# Patient Record
Sex: Male | Born: 1945
Health system: Southern US, Community
[De-identification: ages and names within clinical notes are randomized; demographics above are authoritative.]

## PROBLEM LIST (undated history)

## (undated) DIAGNOSIS — E7800 Pure hypercholesterolemia, unspecified: Secondary | ICD-10-CM

## (undated) DIAGNOSIS — J9801 Acute bronchospasm: Secondary | ICD-10-CM

## (undated) DIAGNOSIS — K284 Chronic or unspecified gastrojejunal ulcer with hemorrhage: Secondary | ICD-10-CM

## (undated) DIAGNOSIS — K274 Chronic or unspecified peptic ulcer, site unspecified, with hemorrhage: Secondary | ICD-10-CM

## (undated) DIAGNOSIS — K5790 Diverticulosis of intestine, part unspecified, without perforation or abscess without bleeding: Secondary | ICD-10-CM

## (undated) DIAGNOSIS — S78119A Complete traumatic amputation at level between unspecified hip and knee, initial encounter: Secondary | ICD-10-CM

## (undated) DIAGNOSIS — E119 Type 2 diabetes mellitus without complications: Secondary | ICD-10-CM

## (undated) HISTORY — PX: KNEE SURGERY: SHX244

## (undated) HISTORY — DX: Diverticulosis of intestine, part unspecified, without perforation or abscess without bleeding: K57.90

## (undated) HISTORY — PX: REPLACEMENT TOTAL KNEE: SUR1224

## (undated) HISTORY — DX: Chronic or unspecified peptic ulcer, site unspecified, with hemorrhage: K27.4

## (undated) HISTORY — DX: Complete traumatic amputation at level between unspecified hip and knee, initial encounter: S78.119A

## (undated) HISTORY — DX: Chronic or unspecified gastrojejunal ulcer with hemorrhage: K28.4

## (undated) HISTORY — DX: Type 2 diabetes mellitus without complications: E11.9

## (undated) HISTORY — PX: APPENDECTOMY: SHX54

## (undated) HISTORY — DX: Acute bronchospasm: J98.01

## (undated) HISTORY — DX: Pure hypercholesterolemia, unspecified: E78.00

---

## 1969-04-10 HISTORY — PX: ABOVE KNEE LEG AMPUTATION: SUR20

## 2004-09-17 ENCOUNTER — Ambulatory Visit: Payer: Self-pay | Admitting: Internal Medicine

## 2005-04-04 ENCOUNTER — Encounter: Admission: RE | Admit: 2005-04-04 | Discharge: 2005-04-04 | Payer: Self-pay | Admitting: Family Medicine

## 2008-12-05 ENCOUNTER — Inpatient Hospital Stay: Payer: Self-pay | Admitting: Internal Medicine

## 2008-12-21 ENCOUNTER — Ambulatory Visit: Payer: Self-pay | Admitting: Cardiovascular Disease

## 2009-01-21 ENCOUNTER — Ambulatory Visit: Payer: Self-pay | Admitting: Gastroenterology

## 2011-07-26 ENCOUNTER — Ambulatory Visit: Payer: Self-pay

## 2014-06-18 DIAGNOSIS — S78111D Complete traumatic amputation at level between right hip and knee, subsequent encounter: Secondary | ICD-10-CM | POA: Diagnosis not present

## 2014-06-18 DIAGNOSIS — J9801 Acute bronchospasm: Secondary | ICD-10-CM | POA: Diagnosis not present

## 2014-06-18 DIAGNOSIS — R05 Cough: Secondary | ICD-10-CM | POA: Diagnosis not present

## 2014-06-22 ENCOUNTER — Emergency Department: Payer: Self-pay | Admitting: Emergency Medicine

## 2014-06-22 DIAGNOSIS — R05 Cough: Secondary | ICD-10-CM | POA: Diagnosis not present

## 2014-06-22 DIAGNOSIS — I10 Essential (primary) hypertension: Secondary | ICD-10-CM | POA: Diagnosis not present

## 2014-06-22 DIAGNOSIS — J209 Acute bronchitis, unspecified: Secondary | ICD-10-CM | POA: Diagnosis not present

## 2014-06-22 DIAGNOSIS — R9431 Abnormal electrocardiogram [ECG] [EKG]: Secondary | ICD-10-CM | POA: Diagnosis not present

## 2014-06-22 DIAGNOSIS — R0602 Shortness of breath: Secondary | ICD-10-CM | POA: Diagnosis not present

## 2014-07-02 DIAGNOSIS — Z0000 Encounter for general adult medical examination without abnormal findings: Secondary | ICD-10-CM | POA: Diagnosis not present

## 2014-07-02 DIAGNOSIS — I2510 Atherosclerotic heart disease of native coronary artery without angina pectoris: Secondary | ICD-10-CM | POA: Diagnosis not present

## 2014-07-02 DIAGNOSIS — I10 Essential (primary) hypertension: Secondary | ICD-10-CM | POA: Diagnosis not present

## 2014-07-02 DIAGNOSIS — Z125 Encounter for screening for malignant neoplasm of prostate: Secondary | ICD-10-CM | POA: Diagnosis not present

## 2014-07-02 DIAGNOSIS — E119 Type 2 diabetes mellitus without complications: Secondary | ICD-10-CM | POA: Diagnosis not present

## 2014-07-02 DIAGNOSIS — E780 Pure hypercholesterolemia: Secondary | ICD-10-CM | POA: Diagnosis not present

## 2014-09-29 DIAGNOSIS — I2510 Atherosclerotic heart disease of native coronary artery without angina pectoris: Secondary | ICD-10-CM | POA: Insufficient documentation

## 2014-09-29 DIAGNOSIS — K284 Chronic or unspecified gastrojejunal ulcer with hemorrhage: Secondary | ICD-10-CM | POA: Insufficient documentation

## 2014-09-29 DIAGNOSIS — E1169 Type 2 diabetes mellitus with other specified complication: Secondary | ICD-10-CM | POA: Insufficient documentation

## 2014-09-29 DIAGNOSIS — I10 Essential (primary) hypertension: Secondary | ICD-10-CM

## 2014-09-29 DIAGNOSIS — E7800 Pure hypercholesterolemia, unspecified: Secondary | ICD-10-CM

## 2014-09-29 DIAGNOSIS — E785 Hyperlipidemia, unspecified: Secondary | ICD-10-CM | POA: Insufficient documentation

## 2014-09-29 DIAGNOSIS — K274 Chronic or unspecified peptic ulcer, site unspecified, with hemorrhage: Secondary | ICD-10-CM | POA: Insufficient documentation

## 2014-09-29 DIAGNOSIS — K5790 Diverticulosis of intestine, part unspecified, without perforation or abscess without bleeding: Secondary | ICD-10-CM | POA: Insufficient documentation

## 2014-09-29 DIAGNOSIS — I152 Hypertension secondary to endocrine disorders: Secondary | ICD-10-CM | POA: Insufficient documentation

## 2014-09-29 DIAGNOSIS — S78111D Complete traumatic amputation at level between right hip and knee, subsequent encounter: Secondary | ICD-10-CM | POA: Insufficient documentation

## 2014-09-29 DIAGNOSIS — E1159 Type 2 diabetes mellitus with other circulatory complications: Secondary | ICD-10-CM | POA: Insufficient documentation

## 2014-09-29 DIAGNOSIS — J9801 Acute bronchospasm: Secondary | ICD-10-CM

## 2014-09-29 DIAGNOSIS — S78119A Complete traumatic amputation at level between unspecified hip and knee, initial encounter: Secondary | ICD-10-CM

## 2014-09-29 DIAGNOSIS — K254 Chronic or unspecified gastric ulcer with hemorrhage: Secondary | ICD-10-CM | POA: Insufficient documentation

## 2014-09-29 DIAGNOSIS — S78111A Complete traumatic amputation at level between right hip and knee, initial encounter: Secondary | ICD-10-CM | POA: Insufficient documentation

## 2014-09-30 ENCOUNTER — Ambulatory Visit (INDEPENDENT_AMBULATORY_CARE_PROVIDER_SITE_OTHER): Payer: Medicare Other | Admitting: Family Medicine

## 2014-09-30 ENCOUNTER — Encounter: Payer: Self-pay | Admitting: Family Medicine

## 2014-09-30 VITALS — BP 138/69 | HR 57 | Temp 97.9°F | Ht 67.0 in | Wt 188.0 lb

## 2014-09-30 DIAGNOSIS — E7800 Pure hypercholesterolemia, unspecified: Secondary | ICD-10-CM

## 2014-09-30 DIAGNOSIS — E119 Type 2 diabetes mellitus without complications: Secondary | ICD-10-CM

## 2014-09-30 DIAGNOSIS — I2583 Coronary atherosclerosis due to lipid rich plaque: Secondary | ICD-10-CM

## 2014-09-30 DIAGNOSIS — I2510 Atherosclerotic heart disease of native coronary artery without angina pectoris: Secondary | ICD-10-CM

## 2014-09-30 DIAGNOSIS — I10 Essential (primary) hypertension: Secondary | ICD-10-CM | POA: Diagnosis not present

## 2014-09-30 DIAGNOSIS — S78111D Complete traumatic amputation at level between right hip and knee, subsequent encounter: Secondary | ICD-10-CM

## 2014-09-30 LAB — BAYER DCA HB A1C WAIVED: HB A1C (BAYER DCA - WAIVED): 7.2 % — ABNORMAL HIGH (ref <7.0)

## 2014-09-30 NOTE — Assessment & Plan Note (Signed)
The current medical regimen is effective;  continue present plan and medications.  

## 2014-09-30 NOTE — Assessment & Plan Note (Signed)
Stable needs new cup gave rx

## 2014-09-30 NOTE — Progress Notes (Signed)
   BP 138/69 mmHg  Pulse 57  Temp(Src) 97.9 F (36.6 C)  Ht 5\' 7"  (1.702 m)  Wt 188 lb (85.276 kg)  BMI 29.44 kg/m2  SpO2 95%   Subjective:    Patient ID: Carl Bryan, male    DOB: 05-Jun-1945, 69 y.o.   MRN: [id number]  HPI: Carl Bryan is a 69 y.o. male  Chief Complaint  Patient presents with  . Diabetes  . Referral    Rx for prosthetic cup  done on paper pt with AKA on rt  Doing well with medical problems No side effects meds Takes every day No low bs spells Good long term control  Relevant past medical, surgical, family and social history reviewed and updated as indicated. Interim medical history since our last visit reviewed. Allergies and medications reviewed and updated.  Review of Systems  Constitutional: Negative.   Respiratory: Negative.   Cardiovascular: Negative.     Per HPI unless specifically indicated above     Objective:    BP 138/69 mmHg  Pulse 57  Temp(Src) 97.9 F (36.6 C)  Ht 5\' 7"  (1.702 m)  Wt 188 lb (85.276 kg)  BMI 29.44 kg/m2  SpO2 95%  Wt Readings from Last 3 Encounters:  09/30/14 188 lb (85.276 kg)  07/02/14 175 lb (79.379 kg)  07/02/14 175 lb (79.379 kg)    Physical Exam  Constitutional: He is oriented to person, place, and time. He appears well-developed and well-nourished. No distress.  HENT:  Head: Normocephalic and atraumatic.  Right Ear: Hearing normal.  Left Ear: Hearing normal.  Nose: Nose normal.  Eyes: Conjunctivae and lids are normal. Right eye exhibits no discharge. Left eye exhibits no discharge. No scleral icterus.  Pulmonary/Chest: Effort normal. No respiratory distress.  Musculoskeletal: Normal range of motion.  Rt AKA  Neurological: He is alert and oriented to person, place, and time.  Skin: Skin is intact. No rash noted.  Psychiatric: He has a normal mood and affect. His speech is normal and behavior is normal. Judgment and thought content normal. Cognition and memory are normal.    Results for orders  placed or performed in visit on 09/30/14  Bayer DCA Hb A1c Waived  Result Value Ref Range   Bayer DCA Hb A1c Waived 7.2 (H) <7.0 %      Assessment & Plan:   Problem List Items Addressed This Visit    None    Visit Diagnoses    Diabetes mellitus without complication    -  Primary    Relevant Orders    Bayer DCA Hb A1c Waived (Completed)        Follow up plan: Return in about 3 months (around 12/31/2014) for recheck meds.

## 2014-12-31 ENCOUNTER — Encounter: Payer: Self-pay | Admitting: Family Medicine

## 2014-12-31 ENCOUNTER — Ambulatory Visit (INDEPENDENT_AMBULATORY_CARE_PROVIDER_SITE_OTHER): Payer: Medicare Other | Admitting: Family Medicine

## 2014-12-31 VITALS — BP 136/84 | HR 57 | Temp 97.8°F | Ht 66.2 in | Wt 176.0 lb

## 2014-12-31 DIAGNOSIS — E119 Type 2 diabetes mellitus without complications: Secondary | ICD-10-CM | POA: Diagnosis not present

## 2014-12-31 DIAGNOSIS — I2510 Atherosclerotic heart disease of native coronary artery without angina pectoris: Secondary | ICD-10-CM | POA: Diagnosis not present

## 2014-12-31 DIAGNOSIS — E7800 Pure hypercholesterolemia, unspecified: Secondary | ICD-10-CM

## 2014-12-31 DIAGNOSIS — E785 Hyperlipidemia, unspecified: Secondary | ICD-10-CM

## 2014-12-31 DIAGNOSIS — Z23 Encounter for immunization: Secondary | ICD-10-CM | POA: Diagnosis not present

## 2014-12-31 DIAGNOSIS — I10 Essential (primary) hypertension: Secondary | ICD-10-CM

## 2014-12-31 DIAGNOSIS — I2583 Coronary atherosclerosis due to lipid rich plaque: Secondary | ICD-10-CM

## 2014-12-31 LAB — LP+ALT+AST PICCOLO, WAIVED
ALT (SGPT) Piccolo, Waived: 64 U/L — ABNORMAL HIGH (ref 10–47)
AST (SGOT) Piccolo, Waived: 41 U/L — ABNORMAL HIGH (ref 11–38)
Chol/HDL Ratio Piccolo,Waive: 3.6 mg/dL
Cholesterol Piccolo, Waived: 143 mg/dL (ref <200)
HDL Chol Piccolo, Waived: 40 mg/dL — ABNORMAL LOW (ref >59)
LDL Chol Calc Piccolo Waived: 67 mg/dL (ref <100)
Triglycerides Piccolo,Waived: 181 mg/dL — ABNORMAL HIGH (ref <150)
VLDL Chol Calc Piccolo,Waive: 36 mg/dL — ABNORMAL HIGH (ref <30)

## 2014-12-31 LAB — BAYER DCA HB A1C WAIVED: HB A1C (BAYER DCA - WAIVED): 7.7 % — ABNORMAL HIGH (ref <7.0)

## 2014-12-31 MED ORDER — ALLOPURINOL 300 MG PO TABS: 300.0000 mg | ORAL_TABLET | Freq: Every day | ORAL | Status: DC

## 2014-12-31 MED ORDER — GLIPIZIDE 5 MG PO TABS: 5.0000 mg | ORAL_TABLET | Freq: Every day | ORAL | Status: DC

## 2014-12-31 MED ORDER — LOSARTAN POTASSIUM 100 MG PO TABS: 100.0000 mg | ORAL_TABLET | Freq: Every day | ORAL | Status: DC

## 2014-12-31 MED ORDER — ROSUVASTATIN CALCIUM 40 MG PO TABS: 40.0000 mg | ORAL_TABLET | Freq: Every day | ORAL | Status: DC

## 2014-12-31 MED ORDER — METFORMIN HCL 500 MG PO TABS: 500.0000 mg | ORAL_TABLET | Freq: Two times a day (BID) | ORAL | Status: DC

## 2014-12-31 NOTE — Assessment & Plan Note (Signed)
The current medical regimen is effective;  continue present plan and medications.  

## 2014-12-31 NOTE — Progress Notes (Signed)
BP 136/84 mmHg  Pulse 57  Temp(Src) 97.8 F (36.6 C)  Ht 5' 6.2" (1.681 m)  Wt 176 lb (79.833 kg)  BMI 28.25 kg/m2  SpO2 95%   Subjective:    Patient ID: Carl Bryan, male    DOB: 05-20-1945, 69 y.o.   MRN: [id number]  HPI: Carl Bryan is a 69 y.o. male  Chief Complaint  Patient presents with  . Diabetes  . Hyperlipidemia  . Hypertension   Patient concern today is feeling tired and draggy very sleepy after he eats in the morning lasts until he sleeps about an hour and then is good for the day. No chest pain chest tightness with exertion. Blood sugar with home checks around 120 or so. No gout symptoms Blood pressure cholesterol doing well no complaints from medications takes everyday without problems or side effects. Relevant past medical, surgical, family and social history reviewed and updated as indicated. Interim medical history since our last visit reviewed. Allergies and medications reviewed and updated.  Review of Systems  Constitutional: Negative.   Respiratory: Negative.   Cardiovascular: Negative.     Per HPI unless specifically indicated above     Objective:    BP 136/84 mmHg  Pulse 57  Temp(Src) 97.8 F (36.6 C)  Ht 5' 6.2" (1.681 m)  Wt 176 lb (79.833 kg)  BMI 28.25 kg/m2  SpO2 95%  Wt Readings from Last 3 Encounters:  12/31/14 176 lb (79.833 kg)  09/30/14 188 lb (85.276 kg)  07/02/14 175 lb (79.379 kg)    Physical Exam  Constitutional: He is oriented to person, place, and time. He appears well-developed and well-nourished. No distress.  HENT:  Head: Normocephalic and atraumatic.  Right Ear: Hearing normal.  Left Ear: Hearing normal.  Nose: Nose normal.  Eyes: Conjunctivae and lids are normal. Right eye exhibits no discharge. Left eye exhibits no discharge. No scleral icterus.  Cardiovascular: Normal rate, regular rhythm and normal heart sounds.   Pulmonary/Chest: Effort normal and breath sounds normal. No respiratory distress.   Musculoskeletal: Normal range of motion.  Neurological: He is alert and oriented to person, place, and time.  Skin: Skin is intact. No rash noted.  Psychiatric: He has a normal mood and affect. His speech is normal and behavior is normal. Judgment and thought content normal. Cognition and memory are normal.    Results for orders placed or performed in visit on 09/30/14  Bayer DCA Hb A1c Waived  Result Value Ref Range   Bayer DCA Hb A1c Waived 7.2 (H) <7.0 %      Assessment & Plan:   Problem List Items Addressed This Visit      Cardiovascular and Mediastinum   Essential (primary) hypertension    The current medical regimen is effective;  continue present plan and medications.       Relevant Medications   rosuvastatin (CRESTOR) 40 MG tablet   losartan (COZAAR) 100 MG tablet   CAD (coronary artery disease)    The current medical regimen is effective;  continue present plan and medications.       Relevant Medications   rosuvastatin (CRESTOR) 40 MG tablet   losartan (COZAAR) 100 MG tablet     Endocrine   Type 2 diabetes mellitus    Hemoglobin A1c elevated may be contributing to patient's fatigue Patient will check blood sugar after breakfast see it's too high Will start taking metformin twice a day instead of just at bedtime. And observe fatigue symptoms.  Relevant Medications   rosuvastatin (CRESTOR) 40 MG tablet   metFORMIN (GLUCOPHAGE) 500 MG tablet   losartan (COZAAR) 100 MG tablet   glipiZIDE (GLUCOTROL) 5 MG tablet     Other   Pure hypercholesterolemia    The current medical regimen is effective;  continue present plan and medications.       Relevant Medications   rosuvastatin (CRESTOR) 40 MG tablet   losartan (COZAAR) 100 MG tablet    Other Visit Diagnoses    Diabetes mellitus without complication    -  Primary    Relevant Medications    rosuvastatin (CRESTOR) 40 MG tablet    metFORMIN (GLUCOPHAGE) 500 MG tablet    losartan (COZAAR) 100 MG tablet     glipiZIDE (GLUCOTROL) 5 MG tablet    Other Relevant Orders    Bayer DCA Hb A1c Waived    LP+ALT+AST Piccolo, Waived    Basic metabolic panel    Hyperlipemia        Relevant Medications    rosuvastatin (CRESTOR) 40 MG tablet    losartan (COZAAR) 100 MG tablet    Other Relevant Orders    Bayer DCA Hb A1c Waived    LP+ALT+AST Piccolo, Waived    Basic metabolic panel    Essential hypertension, benign        Relevant Medications    rosuvastatin (CRESTOR) 40 MG tablet    losartan (COZAAR) 100 MG tablet    Other Relevant Orders    Bayer DCA Hb A1c Waived    LP+ALT+AST Piccolo, Waived    Basic metabolic panel    Immunization due        Relevant Orders    Flu Vaccine QUAD 36+ mos PF IM (Fluarix & Fluzone Quad PF) (Completed)        Follow up plan: Return in about 3 months (around 04/01/2015), or if symptoms worsen or fail to improve, for  check hemoglobin A1c.

## 2014-12-31 NOTE — Assessment & Plan Note (Signed)
Hemoglobin A1c elevated may be contributing to patient's fatigue Patient will check blood sugar after breakfast see it's too high Will start taking metformin twice a day instead of just at bedtime. And observe fatigue symptoms.

## 2015-01-01 LAB — BASIC METABOLIC PANEL
BUN/Creatinine Ratio: 22 (ref 10–22)
BUN: 16 mg/dL (ref 8–27)
CO2: 23 mmol/L (ref 18–29)
Calcium: 10.2 mg/dL (ref 8.6–10.2)
Chloride: 99 mmol/L (ref 97–108)
Creatinine, Ser: 0.74 mg/dL — ABNORMAL LOW (ref 0.76–1.27)
GFR calc Af Amer: 108 mL/min/{1.73_m2} (ref >59)
GFR calc non Af Amer: 93 mL/min/{1.73_m2} (ref >59)
Glucose: 167 mg/dL — ABNORMAL HIGH (ref 65–99)
Potassium: 4.5 mmol/L (ref 3.5–5.2)
Sodium: 137 mmol/L (ref 134–144)

## 2015-02-08 ENCOUNTER — Encounter: Payer: Self-pay | Admitting: Family Medicine

## 2015-02-08 ENCOUNTER — Ambulatory Visit (INDEPENDENT_AMBULATORY_CARE_PROVIDER_SITE_OTHER): Payer: Medicare Other | Admitting: Family Medicine

## 2015-02-08 VITALS — BP 154/70 | HR 65 | Temp 98.8°F | Ht 66.0 in | Wt 177.0 lb

## 2015-02-08 DIAGNOSIS — J0110 Acute frontal sinusitis, unspecified: Secondary | ICD-10-CM | POA: Diagnosis not present

## 2015-02-08 DIAGNOSIS — J329 Chronic sinusitis, unspecified: Secondary | ICD-10-CM | POA: Insufficient documentation

## 2015-02-08 MED ORDER — AZITHROMYCIN 250 MG PO TABS: ORAL_TABLET | ORAL | Status: DC

## 2015-02-08 NOTE — Progress Notes (Signed)
BP 154/70 mmHg  Pulse 65  Temp(Src) 98.8 F (37.1 C)  Ht  (1.676 m)  Wt 177 lb (80.287 kg)  BMI 28.58 kg/m2  SpO2 94%   Subjective:    Patient ID: Carl Bryan, male    DOB: December 03, 1945, 69 y.o.   MRN: [id number]  HPI: Carl Bryan is a 69 y.o. male  Chief Complaint  Patient presents with  . Cough   Patient now with 4 days of sinus pressure congestion cough generally productive and getting worse over the last weekend. Patient's taking some over-the-counter medications Tylenol Sinus with some help helped with headache primarily. Has tried some over-the-counter cough medicine which helped a little bit to enable him to get some sleep. Not known if running any fever but feels like he is burning up. Relevant past medical, surgical, family and social history reviewed and updated as indicated. Interim medical history since our last visit reviewed. Allergies and medications reviewed and updated.  Review of Systems  Constitutional: Positive for fever, chills, activity change and fatigue.  HENT: Positive for congestion, ear pain, rhinorrhea, sinus pressure, sneezing and sore throat.   Respiratory: Positive for cough and wheezing.   Cardiovascular: Negative for chest pain, palpitations and leg swelling.    Per HPI unless specifically indicated above     Objective:    BP 154/70 mmHg  Pulse 65  Temp(Src) 98.8 F (37.1 C)  Ht  (1.676 m)  Wt 177 lb (80.287 kg)  BMI 28.58 kg/m2  SpO2 94%  Wt Readings from Last 3 Encounters:  02/08/15 177 lb (80.287 kg)  12/31/14 176 lb (79.833 kg)  09/30/14 188 lb (85.276 kg)    Physical Exam  Constitutional: He is oriented to person, place, and time. He appears well-developed and well-nourished. No distress.  HENT:  Head: Normocephalic and atraumatic.  Right Ear: Hearing and external ear normal.  Left Ear: Hearing and external ear normal.  Nose: Nose normal.  Mouth/Throat: Oropharyngeal exudate present.  Eyes: Conjunctivae and lids  are normal. Right eye exhibits no discharge. Left eye exhibits no discharge. No scleral icterus.  Cardiovascular: Normal rate, regular rhythm and normal heart sounds.   Pulmonary/Chest: Effort normal and breath sounds normal. No respiratory distress. He has no wheezes. He has no rales.  Musculoskeletal: Normal range of motion.  Lymphadenopathy:    He has no cervical adenopathy.  Neurological: He is alert and oriented to person, place, and time.  Skin: Skin is intact. No rash noted.  Psychiatric: He has a normal mood and affect. His speech is normal and behavior is normal. Judgment and thought content normal. Cognition and memory are normal.    Results for orders placed or performed in visit on 12/31/14  Bayer DCA Hb A1c Waived  Result Value Ref Range   Bayer DCA Hb A1c Waived 7.7 (H) <7.0 %  LP+ALT+AST Piccolo, Waived  Result Value Ref Range   ALT (SGPT) Piccolo, Waived 64 (H) 10 - 47 U/L   AST (SGOT) Piccolo, Waived 41 (H) 11 - 38 U/L   Cholesterol Piccolo, Waived 143 <200 mg/dL   HDL Chol Piccolo, Waived 40 (L) >59 mg/dL   Triglycerides Piccolo,Waived 181 (H) <150 mg/dL   Chol/HDL Ratio Piccolo,Waive 3.6 mg/dL   LDL Chol Calc Piccolo Waived 67 <100 mg/dL   VLDL Chol Calc Piccolo,Waive 36 (H) <30 mg/dL  Basic metabolic panel  Result Value Ref Range   Glucose 167 (H) 65 - 99 mg/dL   BUN 16 8 -  27 mg/dL   Creatinine, Ser 0.980.74 (L) 0.76 - 1.27 mg/dL   GFR calc non Af Amer 93 >59 mL/min/1.73   GFR calc Af Amer 108 >59 mL/min/1.73   BUN/Creatinine Ratio 22 10 - 22   Sodium 137 134 - 144 mmol/L   Potassium 4.5 3.5 - 5.2 mmol/L   Chloride 99 97 - 108 mmol/L   CO2 23 18 - 29 mmol/L   Calcium 10.2 8.6 - 10.2 mg/dL      Assessment & Plan:   Problem List Items Addressed This Visit      Respiratory   Sinusitis - Primary    Discuss sinus and bronchitis care and treatment Patient use over-the-counter Tylenol sinus Discuss okay to use over-the-counter cough medicines also Cautioned  about driving We'll give Z-Pak is that worked the best in the past      Relevant Medications   azithromycin (ZITHROMAX) 250 MG tablet       Follow up plan: Return if symptoms worsen or fail to improve, for As scheduled.

## 2015-02-08 NOTE — Assessment & Plan Note (Signed)
Discuss sinus and bronchitis care and treatment Patient use over-the-counter Tylenol sinus Discuss okay to use over-the-counter cough medicines also Cautioned about driving We'll give Z-Pak is that worked the best in the past

## 2015-03-17 DIAGNOSIS — Z89611 Acquired absence of right leg above knee: Secondary | ICD-10-CM | POA: Diagnosis not present

## 2015-03-29 ENCOUNTER — Encounter: Payer: Self-pay | Admitting: Family Medicine

## 2015-03-29 ENCOUNTER — Ambulatory Visit (INDEPENDENT_AMBULATORY_CARE_PROVIDER_SITE_OTHER): Payer: Medicare Other | Admitting: Family Medicine

## 2015-03-29 VITALS — BP 163/63 | HR 50 | Temp 98.0°F | Ht 67.3 in | Wt 184.0 lb

## 2015-03-29 DIAGNOSIS — E119 Type 2 diabetes mellitus without complications: Secondary | ICD-10-CM | POA: Diagnosis not present

## 2015-03-29 DIAGNOSIS — I10 Essential (primary) hypertension: Secondary | ICD-10-CM

## 2015-03-29 DIAGNOSIS — I2510 Atherosclerotic heart disease of native coronary artery without angina pectoris: Secondary | ICD-10-CM | POA: Diagnosis not present

## 2015-03-29 DIAGNOSIS — E7800 Pure hypercholesterolemia, unspecified: Secondary | ICD-10-CM | POA: Diagnosis not present

## 2015-03-29 DIAGNOSIS — I2583 Coronary atherosclerosis due to lipid rich plaque: Secondary | ICD-10-CM

## 2015-03-29 LAB — LP+ALT+AST PICCOLO, WAIVED
ALT (SGPT) Piccolo, Waived: 47 U/L (ref 10–47)
AST (SGOT) Piccolo, Waived: 39 U/L — ABNORMAL HIGH (ref 11–38)
Chol/HDL Ratio Piccolo,Waive: 3.9 mg/dL
Cholesterol Piccolo, Waived: 132 mg/dL (ref <200)
HDL Chol Piccolo, Waived: 34 mg/dL — ABNORMAL LOW (ref >59)
LDL Chol Calc Piccolo Waived: 54 mg/dL (ref <100)
Triglycerides Piccolo,Waived: 222 mg/dL — ABNORMAL HIGH (ref <150)
VLDL Chol Calc Piccolo,Waive: 44 mg/dL — ABNORMAL HIGH (ref <30)

## 2015-03-29 LAB — BAYER DCA HB A1C WAIVED: HB A1C (BAYER DCA - WAIVED): 8.1 % — ABNORMAL HIGH (ref <7.0)

## 2015-03-29 MED ORDER — AMLODIPINE BESYLATE 5 MG PO TABS: 5.0000 mg | ORAL_TABLET | Freq: Every day | ORAL | Status: DC

## 2015-03-29 MED ORDER — TRIAMCINOLONE ACETONIDE 0.1 % EX CREA: 1.0000 "application " | TOPICAL_CREAM | Freq: Two times a day (BID) | CUTANEOUS | Status: DC

## 2015-03-29 NOTE — Assessment & Plan Note (Signed)
The current medical regimen is effective;  continue present plan and medications.  

## 2015-03-29 NOTE — Assessment & Plan Note (Signed)
doing well but eating too many cookies over this season. With elevated A1c discuss Will need to change and increase medications next visit if still elevated patient will work on his diet better

## 2015-03-29 NOTE — Progress Notes (Signed)
BP 163/63 mmHg  Pulse 50  Temp(Src) 98 F (36.7 C)  Ht 5' 7.3" (1.709 m)  Wt 184 lb (83.462 kg)  BMI 28.58 kg/m2  SpO2 95%   Subjective:    Patient ID: Carl Bryan, male    DOB: 05/21/1945, 69 y.o.   MRN: [id number]  HPI: Carl Stalled R Hilmer is a 69 y.o. male  Chief Complaint  Patient presents with  . Diabetes  . Hyperlipidemia  . Hypertension  . Rash    scalp and behind left ear    doing well is been eating a lot of sweets with no complaints gained 8 pounds since September is a difference in wearing his prosthesis and not from September. Blood pressure still elevated taking losartan without problems No low blood sugar spells  Crestor without issues  Relevant past medical, surgical, family and social history reviewed and updated as indicated. Interim medical history since our last visit reviewed. Allergies and medications reviewed and updated.  Review of Systems  Constitutional: Negative.   Respiratory: Negative.   Cardiovascular: Negative.     Per HPI unless specifically indicated above     Objective:    BP 163/63 mmHg  Pulse 50  Temp(Src) 98 F (36.7 C)  Ht 5' 7.3" (1.709 m)  Wt 184 lb (83.462 kg)  BMI 28.58 kg/m2  SpO2 95%  Wt Readings from Last 3 Encounters:  03/29/15 184 lb (83.462 kg)  02/08/15 177 lb (80.287 kg)  12/31/14 176 lb (79.833 kg)    Physical Exam  Constitutional: He is oriented to person, place, and time. He appears well-developed and well-nourished. No distress.  HENT:  Head: Normocephalic and atraumatic.  Right Ear: Hearing normal.  Left Ear: Hearing normal.  Nose: Nose normal.  Eyes: Conjunctivae and lids are normal. Right eye exhibits no discharge. Left eye exhibits no discharge. No scleral icterus.  Cardiovascular: Normal rate, regular rhythm and normal heart sounds.   Pulmonary/Chest: Effort normal and breath sounds normal. No respiratory distress.  Musculoskeletal: Normal range of motion.  Neurological: He is alert and oriented to  person, place, and time.  Skin: Skin is intact. No rash noted.  Psychiatric: He has a normal mood and affect. His speech is normal and behavior is normal. Judgment and thought content normal. Cognition and memory are normal.    Results for orders placed or performed in visit on 03/29/15  LP+ALT+AST Piccolo, Arrow ElectronicsWaived  Result Value Ref Range   ALT (SGPT) Piccolo, Waived 47 10 - 47 U/L   AST (SGOT) Piccolo, Waived 39 (H) 11 - 38 U/L   Cholesterol Piccolo, Waived 132 <200 mg/dL   HDL Chol Piccolo, Waived 34 (L) >59 mg/dL   Triglycerides Piccolo,Waived 222 (H) <150 mg/dL   Chol/HDL Ratio Piccolo,Waive 3.9 mg/dL   LDL Chol Calc Piccolo Waived 54 <100 mg/dL   VLDL Chol Calc Piccolo,Waive 44 (H) <30 mg/dL  Bayer DCA Hb W0JA1c Waived  Result Value Ref Range   Bayer DCA Hb A1c Waived 8.1 (H) <7.0 %      Assessment & Plan:   Problem List Items Addressed This Visit      Cardiovascular and Mediastinum   Essential (primary) hypertension    Poor control will add amlodipine 5 mg      Relevant Medications   amLODipine (NORVASC) 5 MG tablet   CAD (coronary artery disease)    The current medical regimen is effective;  continue present plan and medications.       Relevant Medications  amLODipine (NORVASC) 5 MG tablet     Endocrine   Type 2 diabetes mellitus (HCC)     doing well but eating too many cookies over this season. With elevated A1c discuss Will need to change and increase medications next visit if still elevated patient will work on his diet better        Other   Pure hypercholesterolemia - Primary   Relevant Medications   amLODipine (NORVASC) 5 MG tablet   Other Relevant Orders   LP+ALT+AST Piccolo, Waived (Completed)    Other Visit Diagnoses    Diabetes mellitus without complication (HCC)        Relevant Orders    Bayer DCA Hb A1c Waived (Completed)    Type 2 diabetes mellitus without complication (HCC)            Follow up plan: Return in about 3 months (around  06/27/2015), or if symptoms worsen or fail to improve, for Re-months hemoglobin A1c, BMP, and BP check.  Triamcinolone for scalp rash

## 2015-03-29 NOTE — Assessment & Plan Note (Signed)
Poor control will add amlodipine 5mg 

## 2015-03-30 ENCOUNTER — Encounter: Payer: Self-pay | Admitting: Family Medicine

## 2015-03-30 LAB — BASIC METABOLIC PANEL
BUN/Creatinine Ratio: 19 (ref 10–22)
BUN: 15 mg/dL (ref 8–27)
CO2: 18 mmol/L (ref 18–29)
Calcium: 9.7 mg/dL (ref 8.6–10.2)
Chloride: 103 mmol/L (ref 96–106)
Creatinine, Ser: 0.78 mg/dL (ref 0.76–1.27)
GFR calc Af Amer: 105 mL/min/{1.73_m2} (ref >59)
GFR calc non Af Amer: 91 mL/min/{1.73_m2} (ref >59)
Glucose: 165 mg/dL — ABNORMAL HIGH (ref 65–99)
Potassium: 4.7 mmol/L (ref 3.5–5.2)
Sodium: 140 mmol/L (ref 134–144)

## 2015-04-27 ENCOUNTER — Other Ambulatory Visit: Payer: Self-pay | Admitting: Family Medicine

## 2015-04-27 DIAGNOSIS — I10 Essential (primary) hypertension: Secondary | ICD-10-CM

## 2015-04-27 DIAGNOSIS — E119 Type 2 diabetes mellitus without complications: Secondary | ICD-10-CM

## 2015-04-27 MED ORDER — ALLOPURINOL 300 MG PO TABS: 300.0000 mg | ORAL_TABLET | Freq: Every day | ORAL | Status: DC

## 2015-04-27 MED ORDER — GLIPIZIDE 5 MG PO TABS: 5.0000 mg | ORAL_TABLET | Freq: Every day | ORAL | Status: DC

## 2015-04-27 MED ORDER — LOSARTAN POTASSIUM 100 MG PO TABS: 100.0000 mg | ORAL_TABLET | Freq: Every day | ORAL | Status: DC

## 2015-04-27 NOTE — Telephone Encounter (Signed)
Pt's wife called to notify of a change in Pharmacy.  New Pharmacy: [facility]  Pt needs RX's sent for the following:  Glipizide Losartan Allopurinol  Thanks.

## 2015-04-27 NOTE — Telephone Encounter (Signed)
Patient was last seen 03/29/15 and has appointment 07/06/15. Pharmacy is [facility].

## 2015-04-30 ENCOUNTER — Telehealth: Payer: Self-pay | Admitting: Family Medicine

## 2015-04-30 NOTE — Telephone Encounter (Signed)
I received order for alcohol swab and aviva solution I called and left msg, see if patient still needs to check FSBS; will discuss ACE Best Practices

## 2015-04-30 NOTE — Telephone Encounter (Signed)
Pt checks his blood sugar everyday twice a day.

## 2015-05-04 NOTE — Telephone Encounter (Signed)
I spoke with patient; he checks his sugars twice a day; I asked what he does with that and he says he writes it down; doesn't sound like any management being done with the numbers per se; we dicsussed ACE recs and he has my blessing to not check so often, just if symptomatic; he can talk with his provider about this at f/u

## 2015-05-17 ENCOUNTER — Encounter: Payer: Self-pay | Admitting: Unknown Physician Specialty

## 2015-05-17 ENCOUNTER — Ambulatory Visit (INDEPENDENT_AMBULATORY_CARE_PROVIDER_SITE_OTHER): Payer: Commercial Managed Care - HMO | Admitting: Unknown Physician Specialty

## 2015-05-17 VITALS — BP 162/76 | HR 99 | Temp 100.1°F

## 2015-05-17 DIAGNOSIS — R112 Nausea with vomiting, unspecified: Secondary | ICD-10-CM

## 2015-05-17 DIAGNOSIS — R5383 Other fatigue: Secondary | ICD-10-CM

## 2015-05-17 DIAGNOSIS — R55 Syncope and collapse: Secondary | ICD-10-CM

## 2015-05-17 DIAGNOSIS — R059 Cough, unspecified: Secondary | ICD-10-CM

## 2015-05-17 DIAGNOSIS — R05 Cough: Secondary | ICD-10-CM | POA: Diagnosis not present

## 2015-05-17 LAB — CBC WITH DIFFERENTIAL/PLATELET
Hematocrit: 43.8 % (ref 37.5–51.0)
Hemoglobin: 15.7 g/dL (ref 12.6–17.7)
Lymphocytes Absolute: 0.6 10*3/uL — ABNORMAL LOW (ref 0.7–3.1)
Lymphs: 6 %
MCH: 33.5 pg — ABNORMAL HIGH (ref 26.6–33.0)
MCHC: 35.8 g/dL — ABNORMAL HIGH (ref 31.5–35.7)
MCV: 94 fL (ref 79–97)
MID (Absolute): 0.5 10*3/uL (ref 0.1–1.6)
MID: 5 %
Neutrophils Absolute: 8 10*3/uL — ABNORMAL HIGH (ref 1.4–7.0)
Neutrophils: 89 %
Platelets: 160 10*3/uL (ref 150–379)
RBC: 4.68 x10E6/uL (ref 4.14–5.80)
RDW: 13.9 % (ref 12.3–15.4)
WBC: 9.1 10*3/uL (ref 3.4–10.8)

## 2015-05-17 MED ORDER — LEVOFLOXACIN 500 MG PO TABS: 750.0000 mg | ORAL_TABLET | Freq: Every day | ORAL | Status: DC

## 2015-05-17 NOTE — Progress Notes (Signed)
BP 162/76 mmHg  Pulse 99  Temp(Src) 100.1 F (37.8 C)  Ht   Wt   SpO2 92%   Subjective:    Patient ID: Carl Bryan, male    DOB: May 07, 1945, 70 y.o.   MRN: [id number]  HPI: Carl Bryan is a 70 y.o. male  Chief Complaint  Patient presents with  . URI    pt states he has some congestion and has trouble sleeping at night. States this has been going on for about 3 weeks now.   URI: cough, congestion, rhinorrhea, chest pain with cough, hasn't been sleeping due to symptoms. Started one week ago. Has taken Nyquil and a pill (can't recall the name) with no relief thus far. Been around 3 kids at home who are also sick with cough and fever. Flu shot was 12/31/2014.    Relevant past medical, surgical, family and social history reviewed and updated as indicated. Interim medical history since our last visit reviewed. Allergies and medications reviewed and updated.  Review of Systems  Constitutional: Positive for fever, diaphoresis and fatigue.  HENT: Positive for congestion, rhinorrhea and sneezing.   Eyes: Negative.   Respiratory: Positive for cough.   Cardiovascular: Positive for chest pain.  Gastrointestinal: Positive for nausea and vomiting.  Endocrine: Negative.   Genitourinary: Negative.   Skin: Negative.   Allergic/Immunologic: Negative.   Neurological: Negative.   Hematological: Negative.   Psychiatric/Behavioral: Negative.     Per HPI unless specifically indicated above     Objective:    BP 162/76 mmHg  Pulse 99  Temp(Src) 100.1 F (37.8 C)  Ht   Wt   SpO2 92%  Wt Readings from Last 3 Encounters:  03/29/15 184 lb (83.462 kg)  02/08/15 177 lb (80.287 kg)  12/31/14 176 lb (79.833 kg)    Physical Exam  Constitutional: He is oriented to person, place, and time. He appears well-developed and well-nourished.  HENT:  Head: Normocephalic and atraumatic.  Right Ear: Tympanic membrane and ear canal normal.  Left Ear: Tympanic membrane and ear canal normal.  Nose:  Rhinorrhea present. Right sinus exhibits no maxillary sinus tenderness and no frontal sinus tenderness. Left sinus exhibits no maxillary sinus tenderness and no frontal sinus tenderness.  Mouth/Throat: Uvula is midline. Posterior oropharyngeal edema present.  Eyes: Conjunctivae and lids are normal. Right eye exhibits no discharge. Left eye exhibits no discharge. No scleral icterus.  Neck: Neck supple.  Cardiovascular: Normal rate, regular rhythm and normal heart sounds.   Pulmonary/Chest: Effort normal. No respiratory distress. He has decreased breath sounds in the right lower field and the left lower field.  Abdominal: Normal appearance. There is no splenomegaly or hepatomegaly.  Musculoskeletal: Normal range of motion.  Neurological: He is alert and oriented to person, place, and time.  Skin: Skin is warm and intact. No rash noted. He is diaphoretic. No pallor.  Psychiatric: He has a normal mood and affect. His behavior is normal. Judgment and thought content normal.  Nursing note and vitals reviewed.   Results for orders placed or performed in visit on 03/29/15  Basic metabolic panel  Result Value Ref Range   Glucose 165 (H) 65 - 99 mg/dL   BUN 15 8 - 27 mg/dL   Creatinine, Ser 4.09 0.76 - 1.27 mg/dL   GFR calc non Af Amer 91 >59 mL/min/1.73   GFR calc Af Amer 105 >59 mL/min/1.73   BUN/Creatinine Ratio 19 10 - 22   Sodium 140 134 - 144 mmol/L  Potassium 4.7 3.5 - 5.2 mmol/L   Chloride 103 96 - 106 mmol/L   CO2 18 18 - 29 mmol/L   Calcium 9.7 8.6 - 10.2 mg/dL  LP+ALT+AST Piccolo, Waived  Result Value Ref Range   ALT (SGPT) Piccolo, Waived 47 10 - 47 U/L   AST (SGOT) Piccolo, Waived 39 (H) 11 - 38 U/L   Cholesterol Piccolo, Waived 132 <200 mg/dL   HDL Chol Piccolo, Waived 34 (L) >59 mg/dL   Triglycerides Piccolo,Waived 222 (H) <150 mg/dL   Chol/HDL Ratio Piccolo,Waive 3.9 mg/dL   LDL Chol Calc Piccolo Waived 54 <100 mg/dL   VLDL Chol Calc Piccolo,Waive 44 (H) <30 mg/dL  Bayer  DCA Hb R6E Waived  Result Value Ref Range   Bayer DCA Hb A1c Waived 8.1 (H) <7.0 %      Assessment & Plan:   Problem List Items Addressed This Visit    None    Visit Diagnoses    Cough    -  Primary    CBC ordered and results are normal. Patient just doesn't seem to feel well at all.     Relevant Orders    DG Chest 2 View    CBC With Differential/Platelet    Other fatigue        Patient not sleeping due to URI symptoms.     Pre-syncope        Occurred while patient was checking out for today's appointment.     Nausea and vomiting, vomiting of unspecified type        Patient got sick upon checking out for today's appointment. Having patient's wife come to pick him up instead of him driving home.       Patient started to feel better while waiting on wife to come pick him up.  Dr. Dossie Arbour came in to examine him.    Follow up plan: Return if symptoms worsen or fail to improve.

## 2015-05-31 ENCOUNTER — Telehealth: Payer: Self-pay | Admitting: Family Medicine

## 2015-05-31 DIAGNOSIS — E7800 Pure hypercholesterolemia, unspecified: Secondary | ICD-10-CM

## 2015-05-31 MED ORDER — ROSUVASTATIN CALCIUM 40 MG PO TABS: 40.0000 mg | ORAL_TABLET | Freq: Every day | ORAL | Status: DC

## 2015-05-31 NOTE — Telephone Encounter (Signed)
Pt would like a refill on crestor sent to [facility] pharmacy

## 2015-06-21 ENCOUNTER — Ambulatory Visit (INDEPENDENT_AMBULATORY_CARE_PROVIDER_SITE_OTHER): Payer: Commercial Managed Care - HMO | Admitting: Family Medicine

## 2015-06-21 ENCOUNTER — Encounter: Payer: Self-pay | Admitting: Family Medicine

## 2015-06-21 VITALS — BP 143/75 | HR 56 | Temp 97.7°F | Ht 67.6 in | Wt 180.2 lb

## 2015-06-21 DIAGNOSIS — S78111D Complete traumatic amputation at level between right hip and knee, subsequent encounter: Secondary | ICD-10-CM | POA: Diagnosis not present

## 2015-06-21 DIAGNOSIS — R05 Cough: Secondary | ICD-10-CM

## 2015-06-21 DIAGNOSIS — R059 Cough, unspecified: Secondary | ICD-10-CM

## 2015-06-21 DIAGNOSIS — I10 Essential (primary) hypertension: Secondary | ICD-10-CM

## 2015-06-21 NOTE — Assessment & Plan Note (Signed)
The current medical regimen is effective;  continue present plan and medications.  

## 2015-06-21 NOTE — Assessment & Plan Note (Signed)
Stable in wearing brace today

## 2015-06-21 NOTE — Progress Notes (Addendum)
BP 143/75 mmHg  Pulse 56  Temp(Src) 97.7 F (36.5 C)  Ht 5' 7.6" (1.717 m)  Wt 180 lb 3.2 oz (81.738 kg)  BMI 27.73 kg/m2  SpO2 95%   Subjective:    Patient ID: Carl Bryan, male    DOB: [birth year], 70 y.o.   MRN: [id number]  HPI: Carl Bryan is a 70 y.o. male  Chief Complaint  Patient presents with  . Cough    pt states he has had a cough for about a week. States he does cough up some colorless phlegm.    patient with URI got better from last visit but still coughing a great deal can't sleep no fever or chills no blood in cough blood sugars been doing well with no low blood sugar spells blood pressures doing okay slight elevation today with coughing  Relevant past medical, surgical, family and social history reviewed and updated as indicated. Interim medical history since our last visit reviewed. Allergies and medications reviewed and updated.  Review of Systems  Constitutional: Negative.   Respiratory: Negative for choking, shortness of breath and wheezing.   Cardiovascular: Negative.     Per HPI unless specifically indicated above     Objective:    BP 143/75 mmHg  Pulse 56  Temp(Src) 97.7 F (36.5 C)  Ht 5' 7.6" (1.717 m)  Wt 180 lb 3.2 oz (81.738 kg)  BMI 27.73 kg/m2  SpO2 95%  Wt Readings from Last 3 Encounters:  06/21/15 180 lb 3.2 oz (81.738 kg)  03/29/15 184 lb (83.462 kg)  02/08/15 177 lb (80.287 kg)    Physical Exam  Constitutional: He is oriented to person, place, and time. He appears well-developed and well-nourished. No distress.  HENT:  Head: Normocephalic and atraumatic.  Right Ear: Hearing normal.  Left Ear: Hearing normal.  Nose: Nose normal.  Eyes: Conjunctivae and lids are normal. Right eye exhibits no discharge. Left eye exhibits no discharge. No scleral icterus.  Cardiovascular: Normal rate, regular rhythm and normal heart sounds.   Pulmonary/Chest: Effort normal and breath sounds normal. No respiratory distress.  Musculoskeletal: Normal  range of motion.  Neurological: He is alert and oriented to person, place, and time.  Skin: Skin is intact. No rash noted.  Psychiatric: He has a normal mood and affect. His speech is normal and behavior is normal. Judgment and thought content normal. Cognition and memory are normal.    Results for orders placed or performed in visit on 05/17/15  CBC With Differential/Platelet  Result Value Ref Range   WBC 9.1 3.4 - 10.8 x10E3/uL   RBC 4.68 4.14 - 5.80 x10E6/uL   Hemoglobin 15.7 12.6 - 17.7 g/dL   Hematocrit 40.9 81.1 - 51.0 %   MCV 94 79 - 97 fL   MCH 33.5 (H) 26.6 - 33.0 pg   MCHC 35.8 (H) 31.5 - 35.7 g/dL   RDW 91.4 78.2 - 95.6 %   Platelets 160 150 - 379 x10E3/uL   Neutrophils 89 %   Lymphs 6 %   MID 5 %   Neutrophils Absolute 8.0 (H) 1.4 - 7.0 x10E3/uL   Lymphocytes Absolute 0.6 (L) 0.7 - 3.1 x10E3/uL   MID (Absolute) 0.5 0.1 - 1.6 X10E3/uL      Assessment & Plan:   Problem List Items Addressed This Visit      Cardiovascular and Mediastinum   Essential (primary) hypertension    The current medical regimen is effective;  continue present plan and medications.  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

## 2015-07-06 ENCOUNTER — Encounter: Payer: Self-pay | Admitting: Family Medicine

## 2015-07-06 ENCOUNTER — Ambulatory Visit (INDEPENDENT_AMBULATORY_CARE_PROVIDER_SITE_OTHER): Payer: Commercial Managed Care - HMO | Admitting: Family Medicine

## 2015-07-06 VITALS — BP 152/62 | HR 47 | Temp 97.8°F | Ht 66.7 in | Wt 173.0 lb

## 2015-07-06 DIAGNOSIS — Z23 Encounter for immunization: Secondary | ICD-10-CM

## 2015-07-06 DIAGNOSIS — E7800 Pure hypercholesterolemia, unspecified: Secondary | ICD-10-CM

## 2015-07-06 DIAGNOSIS — E119 Type 2 diabetes mellitus without complications: Secondary | ICD-10-CM

## 2015-07-06 DIAGNOSIS — I2510 Atherosclerotic heart disease of native coronary artery without angina pectoris: Secondary | ICD-10-CM | POA: Diagnosis not present

## 2015-07-06 DIAGNOSIS — M109 Gout, unspecified: Secondary | ICD-10-CM | POA: Diagnosis not present

## 2015-07-06 DIAGNOSIS — I10 Essential (primary) hypertension: Secondary | ICD-10-CM

## 2015-07-06 DIAGNOSIS — Z0000 Encounter for general adult medical examination without abnormal findings: Secondary | ICD-10-CM | POA: Diagnosis not present

## 2015-07-06 DIAGNOSIS — Z113 Encounter for screening for infections with a predominantly sexual mode of transmission: Secondary | ICD-10-CM | POA: Diagnosis not present

## 2015-07-06 DIAGNOSIS — I2583 Coronary atherosclerosis due to lipid rich plaque: Secondary | ICD-10-CM

## 2015-07-06 LAB — URINALYSIS, ROUTINE W REFLEX MICROSCOPIC
Bilirubin, UA: NEGATIVE
Glucose, UA: NEGATIVE
Ketones, UA: NEGATIVE
Leukocytes, UA: NEGATIVE
Nitrite, UA: NEGATIVE
Specific Gravity, UA: 1.02 (ref 1.005–1.030)
Urobilinogen, Ur: 0.2 mg/dL (ref 0.2–1.0)
pH, UA: 5 (ref 5.0–7.5)

## 2015-07-06 LAB — MICROSCOPIC EXAMINATION: WBC, UA: NONE SEEN /hpf (ref 0–?)

## 2015-07-06 LAB — BAYER DCA HB A1C WAIVED: HB A1C (BAYER DCA - WAIVED): 7.9 % — ABNORMAL HIGH (ref <7.0)

## 2015-07-06 MED ORDER — METFORMIN HCL 500 MG PO TABS: 500.0000 mg | ORAL_TABLET | Freq: Two times a day (BID) | ORAL | Status: DC

## 2015-07-06 MED ORDER — ROSUVASTATIN CALCIUM 40 MG PO TABS: 40.0000 mg | ORAL_TABLET | Freq: Every day | ORAL | Status: DC

## 2015-07-06 MED ORDER — ALLOPURINOL 300 MG PO TABS: 300.0000 mg | ORAL_TABLET | Freq: Every day | ORAL | Status: DC

## 2015-07-06 MED ORDER — GLIPIZIDE 5 MG PO TABS: 5.0000 mg | ORAL_TABLET | Freq: Every day | ORAL | Status: DC

## 2015-07-06 MED ORDER — AMLODIPINE BESYLATE 2.5 MG PO TABS: 2.5000 mg | ORAL_TABLET | Freq: Every day | ORAL | Status: DC

## 2015-07-06 MED ORDER — LOSARTAN POTASSIUM 100 MG PO TABS: 100.0000 mg | ORAL_TABLET | Freq: Every day | ORAL | Status: DC

## 2015-07-06 MED ORDER — EMPAGLIFLOZIN 25 MG PO TABS: 25.0000 mg | ORAL_TABLET | Freq: Every day | ORAL | Status: DC

## 2015-07-06 NOTE — Assessment & Plan Note (Signed)
The current medical regimen is effective;  continue present plan and medications.  

## 2015-07-06 NOTE — Progress Notes (Signed)
BP 152/62 mmHg  Pulse 47  Temp(Src) 97.8 F (36.6 C)  Ht 5' 6.7" (1.694 m)  Wt 173 lb (78.472 kg)  BMI 27.35 kg/m2  SpO2 94%   Subjective:    Patient ID: Carl Bryan, male    DOB: 07/19/1945, 70 y.o.   MRN: [id number]  HPI: Carl Bryan is a 70 y.o. male  Chief Complaint  Patient presents with  . Annual Exam   patient recheck blood pressures been elevated only took 3 amlodipine 5 mg caused blood pressure dropped too much and stopped the medication. Patient's blood pressure is now elevated again Other medication doing well no complaints No low blood sugar spells No gout symptoms No issues with cholesterol medicines  Relevant past medical, surgical, family and social history reviewed and updated as indicated. Interim medical history since our last visit reviewed. Allergies and medications reviewed and updated.  Review of Systems  Constitutional: Negative.   HENT: Negative.   Eyes: Negative.   Respiratory: Negative.   Cardiovascular: Negative.   Gastrointestinal: Negative.   Endocrine: Negative.   Genitourinary: Negative.   Musculoskeletal: Negative.   Skin: Negative.   Allergic/Immunologic: Negative.   Neurological: Negative.   Hematological: Negative.   Psychiatric/Behavioral: Negative.     Per HPI unless specifically indicated above     Objective:    BP 152/62 mmHg  Pulse 47  Temp(Src) 97.8 F (36.6 C)  Ht 5' 6.7" (1.694 m)  Wt 173 lb (78.472 kg)  BMI 27.35 kg/m2  SpO2 94%  Wt Readings from Last 3 Encounters:  07/06/15 173 lb (78.472 kg)  06/21/15 180 lb 3.2 oz (81.738 kg)  03/29/15 184 lb (83.462 kg)    Physical Exam  Constitutional: He is oriented to person, place, and time. He appears well-developed and well-nourished.  HENT:  Head: Normocephalic and atraumatic.  Right Ear: External ear normal.  Left Ear: External ear normal.  Eyes: Conjunctivae and EOM are normal. Pupils are equal, round, and reactive to light.  Neck: Normal range of motion.  Neck supple.  Cardiovascular: Normal rate, regular rhythm, normal heart sounds and intact distal pulses.   Pulmonary/Chest: Effort normal and breath sounds normal.  Abdominal: Soft. Bowel sounds are normal. There is no splenomegaly or hepatomegaly.  Genitourinary: Rectum normal, prostate normal and penis normal.  Musculoskeletal: Normal range of motion.  Rt AK   Neurological: He is alert and oriented to person, place, and time. He has normal reflexes.  Skin: No rash noted. No erythema.  Psychiatric: He has a normal mood and affect. His behavior is normal. Judgment and thought content normal.    Results for orders placed or performed in visit on 05/17/15  CBC With Differential/Platelet  Result Value Ref Range   WBC 9.1 3.4 - 10.8 x10E3/uL   RBC 4.68 4.14 - 5.80 x10E6/uL   Hemoglobin 15.7 12.6 - 17.7 g/dL   Hematocrit 40.943.8 81.137.5 - 51.0 %   MCV 94 79 - 97 fL   MCH 33.5 (H) 26.6 - 33.0 pg   MCHC 35.8 (H) 31.5 - 35.7 g/dL   RDW 91.413.9 78.212.3 - 95.615.4 %   Platelets 160 150 - 379 x10E3/uL   Neutrophils 89 %   Lymphs 6 %   MID 5 %   Neutrophils Absolute 8.0 (H) 1.4 - 7.0 x10E3/uL   Lymphocytes Absolute 0.6 (L) 0.7 - 3.1 x10E3/uL   MID (Absolute) 0.5 0.1 - 1.6 X10E3/uL      Assessment & Plan:   Problem List Items  Addressed This Visit      Cardiovascular and Mediastinum   Essential (primary) hypertension    Poor control as expected off medications will restart amlodipine at 2.5 mg      Relevant Medications   losartan (COZAAR) 100 MG tablet   rosuvastatin (CRESTOR) 40 MG tablet   amLODipine (NORVASC) 2.5 MG tablet   Other Relevant Orders   Comprehensive metabolic panel   Lipid panel   CBC with Differential/Platelet   TSH   Uric acid   PSA   Urinalysis, Routine w reflex microscopic (not at [facility])   Bayer DCA Hb A1c Waived   CAD (coronary artery disease)    The current medical regimen is effective;  continue present plan and medications.       Relevant Medications   losartan  (COZAAR) 100 MG tablet   rosuvastatin (CRESTOR) 40 MG tablet   amLODipine (NORVASC) 2.5 MG tablet   Other Relevant Orders   Comprehensive metabolic panel   Lipid panel   CBC with Differential/Platelet   TSH   Uric acid   PSA   Urinalysis, Routine w reflex microscopic (not at [facility])   Bayer DCA Hb A1c Waived     Endocrine   Type 2 diabetes mellitus (HCC)    Diabetes continued poor control patient unable to affect diet to any degree will add Jardiance 25 mg patient education given       Relevant Medications   glipiZIDE (GLUCOTROL) 5 MG tablet   losartan (COZAAR) 100 MG tablet   metFORMIN (GLUCOPHAGE) 500 MG tablet   rosuvastatin (CRESTOR) 40 MG tablet   empagliflozin (JARDIANCE) 25 MG TABS tablet   Other Relevant Orders   Comprehensive metabolic panel   Lipid panel   CBC with Differential/Platelet   TSH   Uric acid   PSA   Urinalysis, Routine w reflex microscopic (not at [facility])   Bayer DCA Hb A1c Waived     Other   Pure hypercholesterolemia    The current medical regimen is effective;  continue present plan and medications.       Relevant Medications   losartan (COZAAR) 100 MG tablet   rosuvastatin (CRESTOR) 40 MG tablet   amLODipine (NORVASC) 2.5 MG tablet   Other Relevant Orders   Comprehensive metabolic panel   Lipid panel   CBC with Differential/Platelet   TSH   Uric acid   PSA   Urinalysis, Routine w reflex microscopic (not at [facility])   Bayer DCA Hb A1c Waived   Gout    The current medical regimen is effective;  continue present plan and medications.       Relevant Medications   allopurinol (ZYLOPRIM) 300 MG tablet   Other Relevant Orders   Comprehensive metabolic panel   Lipid panel   CBC with Differential/Platelet   TSH   Uric acid   PSA   Urinalysis, Routine w reflex microscopic (not at [facility])   Bayer DCA Hb A1c Waived    Other Visit Diagnoses    Routine screening for STI (sexually transmitted infection)    -  Primary    Relevant Orders     Hepatitis C Antibody    Comprehensive metabolic panel    Lipid panel    CBC with Differential/Platelet    TSH    Uric acid    PSA    Urinalysis, Routine w reflex microscopic (not at [facility])    Bayer DCA Hb A1c Waived    Immunization due  Relevant Orders    Pneumococcal polysaccharide vaccine 23-valent greater than or equal to 2yo subcutaneous/IM (Completed)    Comprehensive metabolic panel    Lipid panel    CBC with Differential/Platelet    TSH    Uric acid    PSA    Urinalysis, Routine w reflex microscopic (not at [facility])    Bayer DCA Hb A1c Waived    PE (physical exam), annual        Relevant Orders    Comprehensive metabolic panel    Lipid panel    CBC with Differential/Platelet    TSH    Uric acid    PSA    Urinalysis, Routine w reflex microscopic (not at [facility])    Bayer DCA Hb A1c Waived        Follow up plan: Return in about 4 weeks (around 08/03/2015) for BP check.

## 2015-07-06 NOTE — Assessment & Plan Note (Signed)
Poor control as expected off medications will restart amlodipine at 2.5 mg

## 2015-07-06 NOTE — Assessment & Plan Note (Addendum)
Diabetes continued poor control patient unable to affect diet to any degree will add Jardiance 25 mg patient education given

## 2015-07-07 ENCOUNTER — Encounter: Payer: Self-pay | Admitting: Family Medicine

## 2015-07-07 LAB — LIPID PANEL
Chol/HDL Ratio: 3.4 ratio units (ref 0.0–5.0)
Cholesterol, Total: 135 mg/dL (ref 100–199)
HDL: 40 mg/dL (ref >39)
LDL Calculated: 66 mg/dL (ref 0–99)
Triglycerides: 146 mg/dL (ref 0–149)
VLDL Cholesterol Cal: 29 mg/dL (ref 5–40)

## 2015-07-07 LAB — PSA: Prostate Specific Ag, Serum: 1.3 ng/mL (ref 0.0–4.0)

## 2015-07-07 LAB — COMPREHENSIVE METABOLIC PANEL
ALT: 39 IU/L (ref 0–44)
AST: 26 IU/L (ref 0–40)
Albumin/Globulin Ratio: 1.7 (ref 1.2–2.2)
Albumin: 4.4 g/dL (ref 3.5–4.8)
Alkaline Phosphatase: 56 IU/L (ref 39–117)
BUN/Creatinine Ratio: 14 (ref 10–22)
BUN: 11 mg/dL (ref 8–27)
Bilirubin Total: 0.7 mg/dL (ref 0.0–1.2)
CO2: 19 mmol/L (ref 18–29)
Calcium: 9.9 mg/dL (ref 8.6–10.2)
Chloride: 101 mmol/L (ref 96–106)
Creatinine, Ser: 0.76 mg/dL (ref 0.76–1.27)
GFR calc Af Amer: 106 mL/min/{1.73_m2} (ref >59)
GFR calc non Af Amer: 92 mL/min/{1.73_m2} (ref >59)
Globulin, Total: 2.6 g/dL (ref 1.5–4.5)
Glucose: 166 mg/dL — ABNORMAL HIGH (ref 65–99)
Potassium: 4.5 mmol/L (ref 3.5–5.2)
Sodium: 137 mmol/L (ref 134–144)
Total Protein: 7 g/dL (ref 6.0–8.5)

## 2015-07-07 LAB — CBC WITH DIFFERENTIAL/PLATELET
Basophils Absolute: 0 10*3/uL (ref 0.0–0.2)
Basos: 0 %
EOS (ABSOLUTE): 0.2 10*3/uL (ref 0.0–0.4)
Eos: 2 %
Hematocrit: 42.4 % (ref 37.5–51.0)
Hemoglobin: 14.8 g/dL (ref 12.6–17.7)
Immature Grans (Abs): 0 10*3/uL (ref 0.0–0.1)
Immature Granulocytes: 0 %
Lymphocytes Absolute: 1.9 10*3/uL (ref 0.7–3.1)
Lymphs: 25 %
MCH: 31.6 pg (ref 26.6–33.0)
MCHC: 34.9 g/dL (ref 31.5–35.7)
MCV: 90 fL (ref 79–97)
Monocytes Absolute: 0.5 10*3/uL (ref 0.1–0.9)
Monocytes: 7 %
Neutrophils Absolute: 4.8 10*3/uL (ref 1.4–7.0)
Neutrophils: 66 %
Platelets: 202 10*3/uL (ref 150–379)
RBC: 4.69 x10E6/uL (ref 4.14–5.80)
RDW: 14.2 % (ref 12.3–15.4)
WBC: 7.4 10*3/uL (ref 3.4–10.8)

## 2015-07-07 LAB — URIC ACID: Uric Acid: 2.3 mg/dL — ABNORMAL LOW (ref 3.7–8.6)

## 2015-07-07 LAB — HEPATITIS C ANTIBODY: Hep C Virus Ab: 0.1 s/co ratio (ref 0.0–0.9)

## 2015-07-07 LAB — TSH: TSH: 2.4 u[IU]/mL (ref 0.450–4.500)

## 2015-08-03 ENCOUNTER — Ambulatory Visit: Payer: Commercial Managed Care - HMO | Admitting: Family Medicine

## 2015-08-12 ENCOUNTER — Ambulatory Visit (INDEPENDENT_AMBULATORY_CARE_PROVIDER_SITE_OTHER): Payer: Commercial Managed Care - HMO | Admitting: Family Medicine

## 2015-08-12 ENCOUNTER — Encounter: Payer: Self-pay | Admitting: Family Medicine

## 2015-08-12 VITALS — BP 132/72 | HR 46 | Temp 97.6°F | Ht 66.7 in | Wt 172.0 lb

## 2015-08-12 DIAGNOSIS — I10 Essential (primary) hypertension: Secondary | ICD-10-CM | POA: Diagnosis not present

## 2015-08-12 DIAGNOSIS — E119 Type 2 diabetes mellitus without complications: Secondary | ICD-10-CM | POA: Diagnosis not present

## 2015-08-12 MED ORDER — EMPAGLIFLOZIN 25 MG PO TABS: 25.0000 mg | ORAL_TABLET | Freq: Every day | ORAL | Status: DC

## 2015-08-12 MED ORDER — AMLODIPINE BESYLATE 2.5 MG PO TABS: 2.5000 mg | ORAL_TABLET | Freq: Every day | ORAL | Status: DC

## 2015-08-12 NOTE — Assessment & Plan Note (Signed)
The current medical regimen is effective;  continue present plan and medications.  

## 2015-08-12 NOTE — Assessment & Plan Note (Signed)
Blood sugars improving with apparent good control no side effects

## 2015-08-12 NOTE — Progress Notes (Signed)
BP 132/72 mmHg  Pulse 46  Temp(Src) 97.6 F (36.4 C)  Ht 5' 6.7" (1.694 m)  Wt 172 lb (78.019 kg)  BMI 27.19 kg/m2  SpO2 96%   Subjective:    Patient ID: Carl Bryan, male    DOB: 04-03-1946, 70 y.o.   MRN: [id number]  HPI: Carl Bryan is a 70 y.o. male  Chief Complaint  Patient presents with  . Hypertension  . paperwork   Recheck hypertension doing well with starting amlodipine 2.5 mg no edema complaints blood pressures been doing well. Started Jardiance blood sugars been doing well no side effects or issues patient's weights coming down also. Relevant past medical, surgical, family and social history reviewed and updated as indicated. Interim medical history since our last visit reviewed. Allergies and medications reviewed and updated.  Review of Systems  Constitutional: Negative.   Respiratory: Negative.   Cardiovascular: Negative.     Per HPI unless specifically indicated above     Objective:    BP 132/72 mmHg  Pulse 46  Temp(Src) 97.6 F (36.4 C)  Ht 5' 6.7" (1.694 m)  Wt 172 lb (78.019 kg)  BMI 27.19 kg/m2  SpO2 96%  Wt Readings from Last 3 Encounters:  08/12/15 172 lb (78.019 kg)  07/06/15 173 lb (78.472 kg)  06/21/15 180 lb 3.2 oz (81.738 kg)    Physical Exam  Constitutional: He is oriented to person, place, and time. He appears well-developed and well-nourished. No distress.  HENT:  Head: Normocephalic and atraumatic.  Right Ear: Hearing normal.  Left Ear: Hearing normal.  Nose: Nose normal.  Eyes: Conjunctivae and lids are normal. Right eye exhibits no discharge. Left eye exhibits no discharge. No scleral icterus.  Cardiovascular: Normal rate, regular Carl and normal heart sounds.   Pulmonary/Chest: Effort normal and breath sounds normal. No respiratory distress.  Musculoskeletal: Normal range of motion.  Neurological: He is alert and oriented to person, place, and time.  Skin: Skin is intact. No rash noted.  Psychiatric: He has a normal mood  and affect. His speech is normal and behavior is normal. Judgment and thought content normal. Cognition and memory are normal.    Results for orders placed or performed in visit on 07/06/15  Microscopic Examination  Result Value Ref Range   WBC, UA None seen 0 -  5 /hpf   RBC, UA 0-2 0 -  2 /hpf   Epithelial Cells (non renal) 0-10 0 - 10 /hpf   Bacteria, UA Few None seen/Few  Hepatitis C Antibody  Result Value Ref Range   Hep C Virus Ab 0.1 0.0 - 0.9 s/co ratio  Comprehensive metabolic panel  Result Value Ref Range   Glucose 166 (H) 65 - 99 mg/dL   BUN 11 8 - 27 mg/dL   Creatinine, Ser 6.21 0.76 - 1.27 mg/dL   GFR calc non Af Amer 92 >59 mL/min/1.73   GFR calc Af Amer 106 >59 mL/min/1.73   BUN/Creatinine Ratio 14 10 - 22   Sodium 137 134 - 144 mmol/L   Potassium 4.5 3.5 - 5.2 mmol/L   Chloride 101 96 - 106 mmol/L   CO2 19 18 - 29 mmol/L   Calcium 9.9 8.6 - 10.2 mg/dL   Total Protein 7.0 6.0 - 8.5 g/dL   Albumin 4.4 3.5 - 4.8 g/dL   Globulin, Total 2.6 1.5 - 4.5 g/dL   Albumin/Globulin Ratio 1.7 1.2 - 2.2   Bilirubin Total 0.7 0.0 - 1.2 mg/dL   Alkaline  Phosphatase 56 39 - 117 IU/L   AST 26 0 - 40 IU/L   ALT 39 0 - 44 IU/L  Lipid panel  Result Value Ref Range   Cholesterol, Total 135 100 - 199 mg/dL   Triglycerides 329146 0 - 149 mg/dL   HDL 40 >51>39 mg/dL   VLDL Cholesterol Cal 29 5 - 40 mg/dL   LDL Calculated 66 0 - 99 mg/dL   Chol/HDL Ratio 3.4 0.0 - 5.0 ratio units  CBC with Differential/Platelet  Result Value Ref Range   WBC 7.4 3.4 - 10.8 x10E3/uL   RBC 4.69 4.14 - 5.80 x10E6/uL   Hemoglobin 14.8 12.6 - 17.7 g/dL   Hematocrit 88.442.4 16.637.5 - 51.0 %   MCV 90 79 - 97 fL   MCH 31.6 26.6 - 33.0 pg   MCHC 34.9 31.5 - 35.7 g/dL   RDW 06.314.2 01.612.3 - 01.015.4 %   Platelets 202 150 - 379 x10E3/uL   Neutrophils 66 %   Lymphs 25 %   Monocytes 7 %   Eos 2 %   Basos 0 %   Neutrophils Absolute 4.8 1.4 - 7.0 x10E3/uL   Lymphocytes Absolute 1.9 0.7 - 3.1 x10E3/uL   Monocytes Absolute  0.5 0.1 - 0.9 x10E3/uL   EOS (ABSOLUTE) 0.2 0.0 - 0.4 x10E3/uL   Basophils Absolute 0.0 0.0 - 0.2 x10E3/uL   Immature Granulocytes 0 %   Immature Grans (Abs) 0.0 0.0 - 0.1 x10E3/uL  TSH  Result Value Ref Range   TSH 2.400 0.450 - 4.500 uIU/mL  Uric acid  Result Value Ref Range   Uric Acid 2.3 (L) 3.7 - 8.6 mg/dL  PSA  Result Value Ref Range   Prostate Specific Ag, Serum 1.3 0.0 - 4.0 ng/mL  Urinalysis, Routine w reflex microscopic (not at [facility])  Result Value Ref Range   Specific Gravity, UA 1.020 1.005 - 1.030   pH, UA 5.0 5.0 - 7.5   Color, UA Yellow Yellow   Appearance Ur Clear Clear   Leukocytes, UA Negative Negative   Protein, UA 1+ (A) Negative/Trace   Glucose, UA Negative Negative   Ketones, UA Negative Negative   RBC, UA Trace (A) Negative   Bilirubin, UA Negative Negative   Urobilinogen, Ur 0.2 0.2 - 1.0 mg/dL   Nitrite, UA Negative Negative   Microscopic Examination See below:   Bayer DCA Hb A1c Waived  Result Value Ref Range   Bayer DCA Hb A1c Waived 7.9 (H) <7.0 %      Assessment & Plan:   Problem List Items Addressed This Visit      Cardiovascular and Mediastinum   Essential (primary) hypertension - Primary    The current medical regimen is effective;  continue present plan and medications.       Relevant Medications   amLODipine (NORVASC) 2.5 MG tablet     Endocrine   Type 2 diabetes mellitus (HCC)    Blood sugars improving with apparent good control no side effects      Relevant Medications   empagliflozin (JARDIANCE) 25 MG TABS tablet       Follow up plan: Return in about 2 months (around 10/12/2015) for A1c.

## 2015-10-04 ENCOUNTER — Encounter: Payer: Self-pay | Admitting: Family Medicine

## 2015-10-04 ENCOUNTER — Ambulatory Visit (INDEPENDENT_AMBULATORY_CARE_PROVIDER_SITE_OTHER): Payer: Commercial Managed Care - HMO | Admitting: Family Medicine

## 2015-10-04 VITALS — BP 156/68 | HR 53 | Temp 98.1°F | Wt 180.0 lb

## 2015-10-04 DIAGNOSIS — H6123 Impacted cerumen, bilateral: Secondary | ICD-10-CM

## 2015-10-04 DIAGNOSIS — J069 Acute upper respiratory infection, unspecified: Secondary | ICD-10-CM

## 2015-10-04 MED ORDER — BENZONATATE 100 MG PO CAPS: 100.0000 mg | ORAL_CAPSULE | Freq: Two times a day (BID) | ORAL | Status: DC | PRN

## 2015-10-04 NOTE — Addendum Note (Signed)
Addended by: Roosvelt MaserLANE, Cardell Zendayah Hardgrave E on: 10/04/2015 11:40 AM   Modules accepted: SmartSet

## 2015-10-04 NOTE — Progress Notes (Addendum)
BP 156/68 mmHg  Pulse 53  Temp(Src) 98.1 F (36.7 C)  Wt 180 lb (81.647 kg)  SpO2 95%   Subjective:    Patient ID: Carl Bryan, male    DOB: March 03, 1946, 70 y.o.   MRN: [id number]  HPI: Carl Bryan is a 70 y.o. male  Chief Complaint  Patient presents with  . URI    sick since last Thursday, cough, sometimes productive, no fever, some headache. started with a sore throat, but it's improved.    Patient presents with 4 day history of cough and intermittent sore throat. Sore throat is minimal and usually only occurs with coughing spells. Cough is worst at night, mostly dry but occasionally productive. Cough often gives him a headache in the night and is affecting his sleep. States his BP is elevated today due to being up all night but that it has been under good control previously. Has been taking OTC cough syrup with mild improvement. Denies CP, SOB, fever, chills, or nasal congestion.   Also c/o muffled hearing. States he frequently has to get his ears cleaned out. Denies ear pain or drainage. Has not tried any OTC products to remove the wax.   Relevant past medical, surgical, family and social history reviewed and updated as indicated. Interim medical history since our last visit reviewed. Allergies and medications reviewed and updated.  Review of Systems  Constitutional: Negative.   HENT: Positive for hearing loss (muffled hearing b/l) and sore throat. Negative for congestion, ear discharge, ear pain, rhinorrhea and sinus pressure.   Eyes: Negative.   Respiratory: Positive for cough. Negative for chest tightness, shortness of breath and wheezing.   Cardiovascular: Negative.   Gastrointestinal: Negative.   Skin: Negative.   Neurological: Negative.   Psychiatric/Behavioral: Negative.     Per HPI unless specifically indicated above     Objective:    BP 156/68 mmHg  Pulse 53  Temp(Src) 98.1 F (36.7 C)  Wt 180 lb (81.647 kg)  SpO2 95%  Wt Readings from Last 3 Encounters:   10/04/15 180 lb (81.647 kg)  08/12/15 172 lb (78.019 kg)  07/06/15 173 lb (78.472 kg)    Physical Exam  Constitutional: He is oriented to person, place, and time. He appears well-developed and well-nourished.  HENT:  Right Ear: External ear normal.  Left Ear: External ear normal.  Mouth/Throat: Oropharynx is clear and moist.  Moderate cerumen in b/l ears  Eyes: Conjunctivae are normal. Pupils are equal, round, and reactive to light. No scleral icterus.  Neck: Neck supple.  Cardiovascular: Normal rate, regular rhythm and normal heart sounds.   Pulmonary/Chest: Effort normal and breath sounds normal. No respiratory distress. He has no wheezes.  Lymphadenopathy:    He has no cervical adenopathy.  Neurological: He is alert and oriented to person, place, and time.  Skin: Skin is warm and dry.  Psychiatric: He has a normal mood and affect. His behavior is normal.    Results for orders placed or performed in visit on 07/06/15  Microscopic Examination  Result Value Ref Range   WBC, UA None seen 0 -  5 /hpf   RBC, UA 0-2 0 -  2 /hpf   Epithelial Cells (non renal) 0-10 0 - 10 /hpf   Bacteria, UA Few None seen/Few  Hepatitis C Antibody  Result Value Ref Range   Hep C Virus Ab 0.1 0.0 - 0.9 s/co ratio  Comprehensive metabolic panel  Result Value Ref Range   Glucose 166 (H)  65 - 99 mg/dL   BUN 11 8 - 27 mg/dL   Creatinine, Ser 4.090.76 0.76 - 1.27 mg/dL   GFR calc non Af Amer 92 >59 mL/min/1.73   GFR calc Af Amer 106 >59 mL/min/1.73   BUN/Creatinine Ratio 14 10 - 22   Sodium 137 134 - 144 mmol/L   Potassium 4.5 3.5 - 5.2 mmol/L   Chloride 101 96 - 106 mmol/L   CO2 19 18 - 29 mmol/L   Calcium 9.9 8.6 - 10.2 mg/dL   Total Protein 7.0 6.0 - 8.5 g/dL   Albumin 4.4 3.5 - 4.8 g/dL   Globulin, Total 2.6 1.5 - 4.5 g/dL   Albumin/Globulin Ratio 1.7 1.2 - 2.2   Bilirubin Total 0.7 0.0 - 1.2 mg/dL   Alkaline Phosphatase 56 39 - 117 IU/L   AST 26 0 - 40 IU/L   ALT 39 0 - 44 IU/L  Lipid  panel  Result Value Ref Range   Cholesterol, Total 135 100 - 199 mg/dL   Triglycerides 811146 0 - 149 mg/dL   HDL 40 >91>39 mg/dL   VLDL Cholesterol Cal 29 5 - 40 mg/dL   LDL Calculated 66 0 - 99 mg/dL   Chol/HDL Ratio 3.4 0.0 - 5.0 ratio units  CBC with Differential/Platelet  Result Value Ref Range   WBC 7.4 3.4 - 10.8 x10E3/uL   RBC 4.69 4.14 - 5.80 x10E6/uL   Hemoglobin 14.8 12.6 - 17.7 g/dL   Hematocrit 47.842.4 29.537.5 - 51.0 %   MCV 90 79 - 97 fL   MCH 31.6 26.6 - 33.0 pg   MCHC 34.9 31.5 - 35.7 g/dL   RDW 62.114.2 30.812.3 - 65.715.4 %   Platelets 202 150 - 379 x10E3/uL   Neutrophils 66 %   Lymphs 25 %   Monocytes 7 %   Eos 2 %   Basos 0 %   Neutrophils Absolute 4.8 1.4 - 7.0 x10E3/uL   Lymphocytes Absolute 1.9 0.7 - 3.1 x10E3/uL   Monocytes Absolute 0.5 0.1 - 0.9 x10E3/uL   EOS (ABSOLUTE) 0.2 0.0 - 0.4 x10E3/uL   Basophils Absolute 0.0 0.0 - 0.2 x10E3/uL   Immature Granulocytes 0 %   Immature Grans (Abs) 0.0 0.0 - 0.1 x10E3/uL  TSH  Result Value Ref Range   TSH 2.400 0.450 - 4.500 uIU/mL  Uric acid  Result Value Ref Range   Uric Acid 2.3 (L) 3.7 - 8.6 mg/dL  PSA  Result Value Ref Range   Prostate Specific Ag, Serum 1.3 0.0 - 4.0 ng/mL  Urinalysis, Routine w reflex microscopic ([facility])  Result Value Ref Range   Specific Gravity, UA 1.020 1.005 - 1.030   pH, UA 5.0 5.0 - 7.5   Color, UA Yellow Yellow   Appearance Ur Clear Clear   Leukocytes, UA Negative Negative   Protein, UA 1+ (A) Negative/Trace   Glucose, UA Negative Negative   Ketones, UA Negative Negative   RBC, UA Trace (A) Negative   Bilirubin, UA Negative Negative   Urobilinogen, Ur 0.2 0.2 - 1.0 mg/dL   Nitrite, UA Negative Negative   Microscopic Examination See below:   Bayer DCA Hb A1c Waived  Result Value Ref Range   Bayer DCA Hb A1c Waived 7.9 (H) <7.0 %      Assessment & Plan:   Problem List Items Addressed This Visit    None    Visit Diagnoses    Upper respiratory infection    -  Primary  Tessalon  perles sent in. Advised patient to take OTC coricedin if symptoms continue.     Cerumen impaction, bilateral        Ear lavage performed today. Recommended patient use OTC debrox drops on regular basis as this is a common issue for him.        B/l ear lavage performed today using solution of warm water and hydrogen peroxide. Curette utilized to dislodge impacted cerumen plugs. Patient tolerated well without complication. Hearing improved per patient.   Follow up plan: Return if symptoms worsen or fail to improve.

## 2015-10-04 NOTE — Addendum Note (Signed)
Addended by: Dorcas CarrowJOHNSON, MEGAN P on: 10/04/2015 11:49 AM   Modules accepted: Kipp BroodSmartSet

## 2015-10-04 NOTE — Patient Instructions (Signed)
Can use Coricidin over the counter is symptoms continue. This will not affect your blood pressure as much as other over the counter cold medications. Call if symptoms don't improve.

## 2015-10-05 ENCOUNTER — Telehealth: Payer: Self-pay | Admitting: Family Medicine

## 2015-10-05 NOTE — Telephone Encounter (Signed)
Patient is scheduled for his f/u on July 11th and will get his labs then.

## 2015-10-05 NOTE — Telephone Encounter (Signed)
Patient did not come in for his labs! Please get him in!

## 2015-10-19 ENCOUNTER — Encounter: Payer: Self-pay | Admitting: Family Medicine

## 2015-10-19 ENCOUNTER — Other Ambulatory Visit: Payer: Self-pay | Admitting: Family Medicine

## 2015-10-19 ENCOUNTER — Ambulatory Visit (INDEPENDENT_AMBULATORY_CARE_PROVIDER_SITE_OTHER): Payer: Commercial Managed Care - HMO | Admitting: Family Medicine

## 2015-10-19 VITALS — BP 139/72 | HR 56 | Temp 98.0°F | Wt 172.0 lb

## 2015-10-19 DIAGNOSIS — E119 Type 2 diabetes mellitus without complications: Secondary | ICD-10-CM

## 2015-10-19 DIAGNOSIS — Z23 Encounter for immunization: Secondary | ICD-10-CM | POA: Diagnosis not present

## 2015-10-19 DIAGNOSIS — Z1211 Encounter for screening for malignant neoplasm of colon: Secondary | ICD-10-CM | POA: Diagnosis not present

## 2015-10-19 LAB — BAYER DCA HB A1C WAIVED: HB A1C (BAYER DCA - WAIVED): 8.2 % — ABNORMAL HIGH (ref <7.0)

## 2015-10-19 NOTE — Assessment & Plan Note (Signed)
A1c going in wrong direction, but had only been taking metformin daily. Will increase bad to BID and recheck in 3 months.

## 2015-10-19 NOTE — Progress Notes (Signed)
BP 139/72 mmHg  Pulse 56  Temp(Src) 98 F (36.7 C)  Wt 172 lb (78.019 kg)  SpO2 99%   Subjective:    Patient ID: Carl Bryan, male    DOB: Apr 27, 1945, 70 y.o.   MRN: [id number]  HPI: Carl Bryan is a 70 y.o. male  Chief Complaint  Patient presents with  . Diabetes   DIABETES- has only been taking his metformin once a day Hypoglycemic episodes:no Polydipsia/polyuria: yes Visual disturbance: no Chest pain: no Paresthesias: no Glucose Monitoring: yes  Accucheck frequency: 3-4 days  Fasting glucose: 120s-180 Taking Insulin?: no Blood Pressure Monitoring: not checking Retinal Examination: Not up to Date Foot Exam: Up to Date Diabetic Education: Completed Pneumovax: Up to Date Influenza: Up to Date Aspirin: no- started bleeding on it  Relevant past medical, surgical, family and social history reviewed and updated as indicated. Interim medical history since our last visit reviewed. Allergies and medications reviewed and updated.  Review of Systems  Constitutional: Negative.   Respiratory: Negative.   Cardiovascular: Negative.   Psychiatric/Behavioral: Negative.     Per HPI unless specifically indicated above     Objective:    BP 139/72 mmHg  Pulse 56  Temp(Src) 98 F (36.7 C)  Wt 172 lb (78.019 kg)  SpO2 99%  Wt Readings from Last 3 Encounters:  10/19/15 172 lb (78.019 kg)  10/04/15 180 lb (81.647 kg)  08/12/15 172 lb (78.019 kg)    Physical Exam  Constitutional: He is oriented to person, place, and time. He appears well-developed and well-nourished. No distress.  HENT:  Head: Normocephalic and atraumatic.  Right Ear: Hearing normal.  Left Ear: Hearing normal.  Nose: Nose normal.  Eyes: Conjunctivae and lids are normal. Right eye exhibits no discharge. Left eye exhibits no discharge. No scleral icterus.  Cardiovascular: Normal rate, regular rhythm and intact distal pulses.  Exam reveals no gallop and no friction rub.   No murmur heard. Pulmonary/Chest:  Effort normal and breath sounds normal. No respiratory distress. He has no wheezes. He has no rales. He exhibits no tenderness.  Musculoskeletal: Normal range of motion.  R leg amputated   Neurological: He is alert and oriented to person, place, and time.  Skin: Skin is warm, dry and intact. No rash noted. No erythema. No pallor.  Psychiatric: He has a normal mood and affect. His speech is normal and behavior is normal. Judgment and thought content normal. Cognition and memory are normal.  Nursing note and vitals reviewed.   Results for orders placed or performed in visit on 07/06/15  Microscopic Examination  Result Value Ref Range   WBC, UA None seen 0 -  5 /hpf   RBC, UA 0-2 0 -  2 /hpf   Epithelial Cells (non renal) 0-10 0 - 10 /hpf   Bacteria, UA Few None seen/Few  Hepatitis C Antibody  Result Value Ref Range   Hep C Virus Ab 0.1 0.0 - 0.9 s/co ratio  Comprehensive metabolic panel  Result Value Ref Range   Glucose 166 (H) 65 - 99 mg/dL   BUN 11 8 - 27 mg/dL   Creatinine, Ser 4.09 0.76 - 1.27 mg/dL   GFR calc non Af Amer 92 >59 mL/min/1.73   GFR calc Af Amer 106 >59 mL/min/1.73   BUN/Creatinine Ratio 14 10 - 22   Sodium 137 134 - 144 mmol/L   Potassium 4.5 3.5 - 5.2 mmol/L   Chloride 101 96 - 106 mmol/L   CO2 19 18 -  29 mmol/L   Calcium 9.9 8.6 - 10.2 mg/dL   Total Protein 7.0 6.0 - 8.5 g/dL   Albumin 4.4 3.5 - 4.8 g/dL   Globulin, Total 2.6 1.5 - 4.5 g/dL   Albumin/Globulin Ratio 1.7 1.2 - 2.2   Bilirubin Total 0.7 0.0 - 1.2 mg/dL   Alkaline Phosphatase 56 39 - 117 IU/L   AST 26 0 - 40 IU/L   ALT 39 0 - 44 IU/L  Lipid panel  Result Value Ref Range   Cholesterol, Total 135 100 - 199 mg/dL   Triglycerides 478146 0 - 149 mg/dL   HDL 40 >29>39 mg/dL   VLDL Cholesterol Cal 29 5 - 40 mg/dL   LDL Calculated 66 0 - 99 mg/dL   Chol/HDL Ratio 3.4 0.0 - 5.0 ratio units  CBC with Differential/Platelet  Result Value Ref Range   WBC 7.4 3.4 - 10.8 x10E3/uL   RBC 4.69 4.14 - 5.80  x10E6/uL   Hemoglobin 14.8 12.6 - 17.7 g/dL   Hematocrit 56.242.4 13.037.5 - 51.0 %   MCV 90 79 - 97 fL   MCH 31.6 26.6 - 33.0 pg   MCHC 34.9 31.5 - 35.7 g/dL   RDW 86.514.2 78.412.3 - 69.615.4 %   Platelets 202 150 - 379 x10E3/uL   Neutrophils 66 %   Lymphs 25 %   Monocytes 7 %   Eos 2 %   Basos 0 %   Neutrophils Absolute 4.8 1.4 - 7.0 x10E3/uL   Lymphocytes Absolute 1.9 0.7 - 3.1 x10E3/uL   Monocytes Absolute 0.5 0.1 - 0.9 x10E3/uL   EOS (ABSOLUTE) 0.2 0.0 - 0.4 x10E3/uL   Basophils Absolute 0.0 0.0 - 0.2 x10E3/uL   Immature Granulocytes 0 %   Immature Grans (Abs) 0.0 0.0 - 0.1 x10E3/uL  TSH  Result Value Ref Range   TSH 2.400 0.450 - 4.500 uIU/mL  Uric acid  Result Value Ref Range   Uric Acid 2.3 (L) 3.7 - 8.6 mg/dL  PSA  Result Value Ref Range   Prostate Specific Ag, Serum 1.3 0.0 - 4.0 ng/mL  Urinalysis, Routine w reflex microscopic (not at [facility])  Result Value Ref Range   Specific Gravity, UA 1.020 1.005 - 1.030   pH, UA 5.0 5.0 - 7.5   Color, UA Yellow Yellow   Appearance Ur Clear Clear   Leukocytes, UA Negative Negative   Protein, UA 1+ (A) Negative/Trace   Glucose, UA Negative Negative   Ketones, UA Negative Negative   RBC, UA Trace (A) Negative   Bilirubin, UA Negative Negative   Urobilinogen, Ur 0.2 0.2 - 1.0 mg/dL   Nitrite, UA Negative Negative   Microscopic Examination See below:   Bayer DCA Hb A1c Waived  Result Value Ref Range   Bayer DCA Hb A1c Waived 7.9 (H) <7.0 %      Assessment & Plan:   Problem List Items Addressed This Visit      Endocrine   Type 2 diabetes mellitus (HCC) - Primary    A1c going in wrong direction, but had only been taking metformin daily. Will increase bad to BID and recheck in 3 months.        Other Visit Diagnoses    Screening for colon cancer        Referral generated today.    Relevant Orders    Ambulatory referral to General Surgery    Need for Tdap vaccination        Given today.    Relevant Orders  Tdap vaccine greater  than or equal to 7yo IM (Completed)        Follow up plan: Return in about 3 months (around 01/19/2016) for DM visit with [facility].

## 2015-10-19 NOTE — Patient Instructions (Signed)
Tdap Vaccine (Tetanus, Diphtheria and Pertussis): What You Need to Know 1. Why get vaccinated? Tetanus, diphtheria and pertussis are very serious diseases. Tdap vaccine can protect us from these diseases. And, Tdap vaccine given to pregnant women can protect newborn babies against pertussis. TETANUS (Lockjaw) is rare in the [place] today. It causes painful muscle tightening and stiffness, usually all over the body.  It can lead to tightening of muscles in the head and neck so you can't open your mouth, swallow, or sometimes even breathe. Tetanus kills about 1 out of 10 people who are infected even after receiving the best medical care. DIPHTHERIA is also rare in the [place] today. It can cause a thick coating to form in the back of the throat.  It can lead to breathing problems, heart failure, paralysis, and death. PERTUSSIS (Whooping Cough) causes severe coughing spells, which can cause difficulty breathing, vomiting and disturbed sleep.  It can also lead to weight loss, incontinence, and rib fractures. Up to 2 in 100 adolescents and 5 in 100 adults with pertussis are hospitalized or have complications, which could include pneumonia or death. These diseases are caused by bacteria. Diphtheria and pertussis are spread from person to person through secretions from coughing or sneezing. Tetanus enters the body through cuts, scratches, or wounds. Before vaccines, as many as 200,000 cases of diphtheria, 200,000 cases of pertussis, and hundreds of cases of tetanus, were reported in the [place] each year. Since vaccination began, reports of cases for tetanus and diphtheria have dropped by about 99% and for pertussis by about 80%. 2. Tdap vaccine Tdap vaccine can protect adolescents and adults from tetanus, diphtheria, and pertussis. One dose of Tdap is routinely given at age 11 or 12. People who did not get Tdap at that age should get it as soon as possible. Tdap is especially important  for healthcare professionals and anyone having close contact with a baby younger than 12 months. Pregnant women should get a dose of Tdap during every pregnancy, to protect the newborn from pertussis. Infants are most at risk for severe, life-threatening complications from pertussis. Another vaccine, called Td, protects against tetanus and diphtheria, but not pertussis. A Td booster should be given every 10 years. Tdap may be given as one of these boosters if you have never gotten Tdap before. Tdap may also be given after a severe cut or burn to prevent tetanus infection. Your doctor or the person giving you the vaccine can give you more information. Tdap may safely be given at the same time as other vaccines. 3. Some people should not get this vaccine  A person who has ever had a life-threatening allergic reaction after a previous dose of any diphtheria, tetanus or pertussis containing vaccine, OR has a severe allergy to any part of this vaccine, should not get Tdap vaccine. Tell the person giving the vaccine about any severe allergies.  Anyone who had coma or long repeated seizures within 7 days after a childhood dose of DTP or DTaP, or a previous dose of Tdap, should not get Tdap, unless a cause other than the vaccine was found. They can still get Td.  Talk to your doctor if you:  have seizures or another nervous system problem,  had severe pain or swelling after any vaccine containing diphtheria, tetanus or pertussis,  ever had a condition called Guillain-Barr Syndrome (GBS),  aren't feeling well on the day the shot is scheduled. 4. Risks With any medicine, including vaccines, there is   a chance of side effects. These are usually mild and go away on their own. Serious reactions are also possible but are rare. Most people who get Tdap vaccine do not have any problems with it. Mild problems following Tdap (Did not interfere with activities)  Pain where the shot was given (about 3 in 4  adolescents or 2 in 3 adults)  Redness or swelling where the shot was given (about 1 person in 5)  Mild fever of at least 100.4F (up to about 1 in 25 adolescents or 1 in 100 adults)  Headache (about 3 or 4 people in 10)  Tiredness (about 1 person in 3 or 4)  Nausea, vomiting, diarrhea, stomach ache (up to 1 in 4 adolescents or 1 in 10 adults)  Chills, sore joints (about 1 person in 10)  Body aches (about 1 person in 3 or 4)  Rash, swollen glands (uncommon) Moderate problems following Tdap (Interfered with activities, but did not require medical attention)  Pain where the shot was given (up to 1 in 5 or 6)  Redness or swelling where the shot was given (up to about 1 in 16 adolescents or 1 in 12 adults)  Fever over 102F (about 1 in 100 adolescents or 1 in 250 adults)  Headache (about 1 in 7 adolescents or 1 in 10 adults)  Nausea, vomiting, diarrhea, stomach ache (up to 1 or 3 people in 100)  Swelling of the entire arm where the shot was given (up to about 1 in 500). Severe problems following Tdap (Unable to perform usual activities; required medical attention)  Swelling, severe pain, bleeding and redness in the arm where the shot was given (rare). Problems that could happen after any vaccine:  People sometimes faint after a medical procedure, including vaccination. Sitting or lying down for about 15 minutes can help prevent fainting, and injuries caused by a fall. Tell your doctor if you feel dizzy, or have vision changes or ringing in the ears.  Some people get severe pain in the shoulder and have difficulty moving the arm where a shot was given. This happens very rarely.  Any medication can cause a severe allergic reaction. Such reactions from a vaccine are very rare, estimated at fewer than 1 in a million doses, and would happen within a few minutes to a few hours after the vaccination. As with any medicine, there is a very remote chance of a vaccine causing a serious  injury or death. The safety of vaccines is always being monitored. For more information, visit: www.cdc.gov/vaccinesafety/ 5. What if there is a serious problem? What should I look for?  Look for anything that concerns you, such as signs of a severe allergic reaction, very high fever, or unusual behavior.  Signs of a severe allergic reaction can include hives, swelling of the face and throat, difficulty breathing, a fast heartbeat, dizziness, and weakness. These would usually start a few minutes to a few hours after the vaccination. What should I do?  If you think it is a severe allergic reaction or other emergency that can't wait, call 9-1-1 or get the person to the nearest hospital. Otherwise, call your doctor.  Afterward, the reaction should be reported to the Vaccine Adverse Event Reporting System (VAERS). Your doctor might file this report, or you can do it yourself through the VAERS web site at www.vaers.hhs.gov, or by calling 1-800-822-7967. VAERS does not give medical advice.  6. The National Vaccine Injury Compensation Program The National Vaccine Injury Compensation Program (  VICP) is a federal program that was created to compensate people who may have been injured by certain vaccines. Persons who believe they may have been injured by a vaccine can learn about the program and about filing a claim by calling 1-800-338-2382 or visiting the VICP website at www.hrsa.gov/vaccinecompensation. There is a time limit to file a claim for compensation. 7. How can I learn more?  Ask your doctor. He or she can give you the vaccine package insert or suggest other sources of information.  Call your local or state health department.  Contact the Centers for Disease Control and Prevention (CDC):  Call 1-800-232-4636 (1-800-CDC-INFO) or  Visit CDC's website at www.cdc.gov/vaccines CDC Tdap Vaccine VIS (06/03/13)   This information is not intended to replace advice given to you by your health care  provider. Make sure you discuss any questions you have with your health care provider.   Document Released: 09/26/2011 Document Revised: 04/17/2014 Document Reviewed: 07/09/2013 Elsevier Interactive Patient Education 2016 Elsevier Inc.  

## 2015-10-22 ENCOUNTER — Telehealth: Payer: Self-pay

## 2015-10-22 NOTE — Telephone Encounter (Signed)
Attempted to call patient to schedule a colonoscopy, number is disconnected. I will send out a notification letter.

## 2016-01-19 ENCOUNTER — Ambulatory Visit (INDEPENDENT_AMBULATORY_CARE_PROVIDER_SITE_OTHER): Payer: Commercial Managed Care - HMO | Admitting: Family Medicine

## 2016-01-19 ENCOUNTER — Encounter: Payer: Self-pay | Admitting: Family Medicine

## 2016-01-19 VITALS — BP 138/72 | HR 51 | Temp 97.7°F | Wt 172.0 lb

## 2016-01-19 DIAGNOSIS — E7800 Pure hypercholesterolemia, unspecified: Secondary | ICD-10-CM | POA: Diagnosis not present

## 2016-01-19 DIAGNOSIS — Z23 Encounter for immunization: Secondary | ICD-10-CM | POA: Diagnosis not present

## 2016-01-19 DIAGNOSIS — E119 Type 2 diabetes mellitus without complications: Secondary | ICD-10-CM | POA: Diagnosis not present

## 2016-01-19 DIAGNOSIS — I10 Essential (primary) hypertension: Secondary | ICD-10-CM | POA: Diagnosis not present

## 2016-01-19 DIAGNOSIS — S78111D Complete traumatic amputation at level between right hip and knee, subsequent encounter: Secondary | ICD-10-CM | POA: Diagnosis not present

## 2016-01-19 LAB — BAYER DCA HB A1C WAIVED: HB A1C (BAYER DCA - WAIVED): 8.3 % — ABNORMAL HIGH (ref <7.0)

## 2016-01-19 MED ORDER — AMLODIPINE BESYLATE 2.5 MG PO TABS: 2.5000 mg | ORAL_TABLET | Freq: Every day | ORAL | 2 refills | Status: DC

## 2016-01-19 MED ORDER — METFORMIN HCL 500 MG PO TABS
500.0000 mg | ORAL_TABLET | Freq: Two times a day (BID) | ORAL | 4 refills | Status: DC
Start: 2016-01-19 — End: 2016-05-15

## 2016-01-19 MED ORDER — SITAGLIPTIN PHOSPHATE 100 MG PO TABS: 100.0000 mg | ORAL_TABLET | Freq: Every day | ORAL | 1 refills | Status: DC

## 2016-01-19 NOTE — Assessment & Plan Note (Signed)
Diabetes poor control will try Januvia and if not doing well will reevaluate sooner than 3 months.

## 2016-01-19 NOTE — Assessment & Plan Note (Signed)
The current medical regimen is effective;  continue present plan and medications.  

## 2016-01-19 NOTE — Progress Notes (Signed)
BP 138/72 (BP Location: Left Arm)   Pulse (!) 51   Temp 97.7 F (36.5 C)   Wt 172 lb (78 kg)   SpO2 96%   BMI 27.18 kg/m    Subjective:    Patient ID: Carl Bryan, male    DOB: [birth year], 70 y.o.   MRN: [id number]  HPI: Carl Bryan is a 70 y.o. male  Chief Complaint  Patient presents with  . Diabetes    patient to call and schedule eye exam  . Orders    He needs an RX for the shingles vaccine to take to the pharmacy  Patient's already had a shingles vaccine doesn't need another Diabetes taking metformin to 500 mg tablets in the morning and evening Not taking Jardiance because it so expensive Is taking glipizide without problems noted low blood sugar spells Blood pressure good control Gout good control No issues with leg and amputation using his crutches today instead of his prosthesis Relevant past medical, surgical, family and social history reviewed and updated as indicated. Interim medical history since our last visit reviewed. Allergies and medications reviewed and updated.  Review of Systems  Constitutional: Negative.   Respiratory: Negative.   Cardiovascular: Negative.     Per HPI unless specifically indicated above     Objective:    BP 138/72 (BP Location: Left Arm)   Pulse (!) 51   Temp 97.7 F (36.5 C)   Wt 172 lb (78 kg)   SpO2 96%   BMI 27.18 kg/m   Wt Readings from Last 3 Encounters:  01/19/16 172 lb (78 kg)  10/19/15 172 lb (78 kg)  10/04/15 180 lb (81.6 kg)    Physical Exam  Constitutional: He is oriented to person, place, and time. He appears well-developed and well-nourished. No distress.  HENT:  Head: Normocephalic and atraumatic.  Right Ear: Hearing normal.  Left Ear: Hearing normal.  Nose: Nose normal.  Eyes: Conjunctivae and lids are normal. Right eye exhibits no discharge. Left eye exhibits no discharge. No scleral icterus.  Cardiovascular: Normal rate, regular rhythm and normal heart sounds.   Pulmonary/Chest: Effort normal and  breath sounds normal. No respiratory distress.  Musculoskeletal: Normal range of motion.  Neurological: He is alert and oriented to person, place, and time.  Skin: Skin is intact. No rash noted.  Psychiatric: He has a normal mood and affect. His speech is normal and behavior is normal. Judgment and thought content normal. Cognition and memory are normal.    Results for orders placed or performed in visit on 10/19/15  Bayer DCA Hb A1c Waived  Result Value Ref Range   Bayer DCA Hb A1c Waived 8.2 (H) <7.0 %      Assessment & Plan:   Problem List Items Addressed This Visit      Cardiovascular and Mediastinum   Essential (primary) hypertension    The current medical regimen is effective;  continue present plan and medications.       Relevant Medications   amLODipine (NORVASC) 2.5 MG tablet     Endocrine   Type 2 diabetes mellitus (HCC) - Primary    Diabetes poor control will try Januvia and if not doing well will reevaluate sooner than 3 months.      Relevant Medications   metFORMIN (GLUCOPHAGE) 500 MG tablet   sitaGLIPtin (JANUVIA) 100 MG tablet   Other Relevant Orders   Bayer DCA Hb A1c Waived     Other   Pure hypercholesterolemia  The current medical regimen is effective;  continue present plan and medications.       Relevant Medications   amLODipine (NORVASC) 2.5 MG tablet   Traumatic amputation of leg above knee (HCC)    Stable with no change       Other Visit Diagnoses    Needs flu shot       Encounter for immunization       Relevant Orders   Bayer DCA Hb A1c Waived   Flu vaccine HIGH DOSE PF (Completed)       Follow up plan: Return in about 3 months (around 04/20/2016) for Hemoglobin A1c.

## 2016-01-19 NOTE — Assessment & Plan Note (Signed)
Stable with no change 

## 2016-04-27 ENCOUNTER — Ambulatory Visit: Payer: Medicare PPO | Admitting: Family Medicine

## 2016-05-15 ENCOUNTER — Encounter: Payer: Self-pay | Admitting: Family Medicine

## 2016-05-15 ENCOUNTER — Ambulatory Visit (INDEPENDENT_AMBULATORY_CARE_PROVIDER_SITE_OTHER): Payer: Medicare PPO | Admitting: Family Medicine

## 2016-05-15 VITALS — BP 152/74 | HR 59 | Temp 97.6°F | Ht 65.0 in | Wt 173.0 lb

## 2016-05-15 DIAGNOSIS — M109 Gout, unspecified: Secondary | ICD-10-CM

## 2016-05-15 DIAGNOSIS — E119 Type 2 diabetes mellitus without complications: Secondary | ICD-10-CM

## 2016-05-15 DIAGNOSIS — E7800 Pure hypercholesterolemia, unspecified: Secondary | ICD-10-CM | POA: Diagnosis not present

## 2016-05-15 DIAGNOSIS — I10 Essential (primary) hypertension: Secondary | ICD-10-CM

## 2016-05-15 MED ORDER — GLIPIZIDE 5 MG PO TABS
5.0000 mg | ORAL_TABLET | Freq: Every day | ORAL | 4 refills | Status: DC
Start: 2016-05-15 — End: 2017-07-24

## 2016-05-15 MED ORDER — EMPAGLIFLOZIN 25 MG PO TABS: 25.0000 mg | ORAL_TABLET | Freq: Every day | ORAL | 2 refills | Status: DC

## 2016-05-15 MED ORDER — AMLODIPINE BESYLATE 5 MG PO TABS: 5.0000 mg | ORAL_TABLET | Freq: Every day | ORAL | 4 refills | Status: DC

## 2016-05-15 MED ORDER — ROSUVASTATIN CALCIUM 40 MG PO TABS
40.0000 mg | ORAL_TABLET | Freq: Every day | ORAL | 4 refills | Status: DC
Start: 2016-05-15 — End: 2017-07-24

## 2016-05-15 MED ORDER — METFORMIN HCL 500 MG PO TABS
1000.0000 mg | ORAL_TABLET | Freq: Two times a day (BID) | ORAL | 4 refills | Status: DC
Start: 2016-05-15 — End: 2017-06-11

## 2016-05-15 MED ORDER — ALLOPURINOL 300 MG PO TABS: 300.0000 mg | ORAL_TABLET | Freq: Every day | ORAL | 4 refills | Status: DC

## 2016-05-15 NOTE — Assessment & Plan Note (Signed)
Poor control blood pressure reviewed and discussed Will start increasing amlodipine from 2.5 to 5 mg

## 2016-05-15 NOTE — Progress Notes (Signed)
BP (!) 152/74   Pulse (!) 59   Temp 97.6 F (36.4 C) (Oral)   Ht 5\' 5"  (1.651 m)   Wt 173 lb (78.5 kg)   SpO2 98%   BMI 28.79 kg/m    Subjective:    Patient ID: Carl Bryan, male    DOB: 18-Sep-1945, 71 y.o.   MRN: [id number]  HPI: Carl Bryan is a 71 y.o. male  Chief Complaint  Patient presents with  . Follow-up  . Diabetes  Patient follow-up diabetes not doing well at all unable to take Januvia due to cost. A1c is continued to go up. Takes metformin 502 tablets in the morning and 2 in the evening also takes glipizide without problems. Noted low blood sugar spells but blood sugars been elevated.  Blood pressure takes medications faithfully, and remains elevated.    Relevant past medical, surgical, family and social history reviewed and updated as indicated. Interim medical history since our last visit reviewed. Allergies and medications reviewed and updated.  Review of Systems  Constitutional: Negative.   Respiratory: Negative.   Cardiovascular: Negative.     Per HPI unless specifically indicated above     Objective:    BP (!) 152/74   Pulse (!) 59   Temp 97.6 F (36.4 C) (Oral)   Ht 5\' 5"  (1.651 m)   Wt 173 lb (78.5 kg)   SpO2 98%   BMI 28.79 kg/m   Wt Readings from Last 3 Encounters:  05/15/16 173 lb (78.5 kg)  01/19/16 172 lb (78 kg)  10/19/15 172 lb (78 kg)    Physical Exam  Constitutional: He is oriented to person, place, and time. He appears well-developed and well-nourished. No distress.  HENT:  Head: Normocephalic and atraumatic.  Right Ear: Hearing normal.  Left Ear: Hearing normal.  Nose: Nose normal.  Eyes: Conjunctivae and lids are normal. Right eye exhibits no discharge. Left eye exhibits no discharge. No scleral icterus.  Cardiovascular: Normal rate, regular rhythm and normal heart sounds.   Pulmonary/Chest: Effort normal and breath sounds normal. No respiratory distress.  Musculoskeletal: Normal range of motion.  Neurological: He is  alert and oriented to person, place, and time.  Skin: Skin is intact. No rash noted.  Psychiatric: He has a normal mood and affect. His speech is normal and behavior is normal. Judgment and thought content normal. Cognition and memory are normal.    Results for orders placed or performed in visit on 01/19/16  Bayer DCA Hb A1c Waived  Result Value Ref Range   Bayer DCA Hb A1c Waived 8.3 (H) <7.0 %      Assessment & Plan:   Problem List Items Addressed This Visit      Cardiovascular and Mediastinum   Essential (primary) hypertension    Poor control blood pressure reviewed and discussed Will start increasing amlodipine from 2.5 to 5 mg      Relevant Medications   rosuvastatin (CRESTOR) 40 MG tablet   amLODipine (NORVASC) 5 MG tablet   Other Relevant Orders   Bayer DCA Hb A1c Waived (STAT)     Endocrine   Type 2 diabetes mellitus (HCC) - Primary    Discuss poor control and worsening control of diabetes need to take more medications and hassles with insurance will try Jardiance 25 mg 1 a day we will aggressively pursue to make sure patient is on some type of medication is metformin and glipizide are not adequate. Patient understands he has to take something will  be more expensive than these generics but hoping to find some compromise for his billfold and diabetes.      Relevant Medications   metFORMIN (GLUCOPHAGE) 500 MG tablet   glipiZIDE (GLUCOTROL) 5 MG tablet   rosuvastatin (CRESTOR) 40 MG tablet   empagliflozin (JARDIANCE) 25 MG TABS tablet   Other Relevant Orders   Bayer DCA Hb A1c Waived (STAT)     Other   Pure hypercholesterolemia   Relevant Medications   rosuvastatin (CRESTOR) 40 MG tablet   amLODipine (NORVASC) 5 MG tablet   Other Relevant Orders   Bayer DCA Hb A1c Waived (STAT)   Gout   Relevant Medications   allopurinol (ZYLOPRIM) 300 MG tablet       Follow up plan: Return for Physical Exam 1-2 mo.

## 2016-05-15 NOTE — Assessment & Plan Note (Signed)
Discuss poor control and worsening control of diabetes need to take more medications and hassles with insurance will try Jardiance 25 mg 1 a day we will aggressively pursue to make sure patient is on some type of medication is metformin and glipizide are not adequate. Patient understands he has to take something will be more expensive than these generics but hoping to find some compromise for his [place] and diabetes.

## 2016-05-16 LAB — BAYER DCA HB A1C WAIVED: HB A1C (BAYER DCA - WAIVED): 9.5 % — ABNORMAL HIGH (ref <7.0)

## 2016-07-05 DIAGNOSIS — Z6826 Body mass index (BMI) 26.0-26.9, adult: Secondary | ICD-10-CM | POA: Diagnosis not present

## 2016-07-05 DIAGNOSIS — G546 Phantom limb syndrome with pain: Secondary | ICD-10-CM | POA: Diagnosis not present

## 2016-07-05 DIAGNOSIS — I10 Essential (primary) hypertension: Secondary | ICD-10-CM | POA: Diagnosis not present

## 2016-07-05 DIAGNOSIS — E785 Hyperlipidemia, unspecified: Secondary | ICD-10-CM | POA: Diagnosis not present

## 2016-07-05 DIAGNOSIS — E663 Overweight: Secondary | ICD-10-CM | POA: Diagnosis not present

## 2016-07-05 DIAGNOSIS — Z89611 Acquired absence of right leg above knee: Secondary | ICD-10-CM | POA: Diagnosis not present

## 2016-07-05 DIAGNOSIS — Z9049 Acquired absence of other specified parts of digestive tract: Secondary | ICD-10-CM | POA: Diagnosis not present

## 2016-07-05 DIAGNOSIS — M109 Gout, unspecified: Secondary | ICD-10-CM | POA: Diagnosis not present

## 2016-07-05 DIAGNOSIS — E119 Type 2 diabetes mellitus without complications: Secondary | ICD-10-CM | POA: Diagnosis not present

## 2016-07-06 ENCOUNTER — Encounter: Payer: Commercial Managed Care - HMO | Admitting: Family Medicine

## 2016-07-13 ENCOUNTER — Telehealth: Payer: Self-pay | Admitting: Family Medicine

## 2016-07-13 NOTE — Telephone Encounter (Signed)
Attempted to reach no answer

## 2016-07-13 NOTE — Telephone Encounter (Signed)
Call pt 

## 2016-07-13 NOTE — Telephone Encounter (Signed)
Per OV in Feb. 2018, "  Poor control blood pressure reviewed and discussed Will start increasing amlodipine from 2.5 to 5 mg  "

## 2016-07-13 NOTE — Telephone Encounter (Signed)
I believe they misunderstood and he was to double up the 2.5 mg until gone and 5 mg tablets were filled. Pt has been taking 5 mg tablets BID. Please advise.

## 2016-07-13 NOTE — Telephone Encounter (Signed)
Pts wife called and stated that the pt was told to take amLODipine (NORVASC) 5 MG tablet twice a day and now he is out. He would like a refill sent to [facility] mail order pharmacy.

## 2016-07-13 NOTE — Telephone Encounter (Signed)
Attempted to reach. Line busy.

## 2016-08-03 ENCOUNTER — Ambulatory Visit (INDEPENDENT_AMBULATORY_CARE_PROVIDER_SITE_OTHER): Payer: Medicare PPO | Admitting: Family Medicine

## 2016-08-03 ENCOUNTER — Encounter: Payer: Self-pay | Admitting: Family Medicine

## 2016-08-03 VITALS — BP 136/70 | HR 53 | Ht 65.0 in | Wt 163.0 lb

## 2016-08-03 DIAGNOSIS — E119 Type 2 diabetes mellitus without complications: Secondary | ICD-10-CM | POA: Diagnosis not present

## 2016-08-03 DIAGNOSIS — Z0000 Encounter for general adult medical examination without abnormal findings: Secondary | ICD-10-CM

## 2016-08-03 DIAGNOSIS — E7800 Pure hypercholesterolemia, unspecified: Secondary | ICD-10-CM

## 2016-08-03 DIAGNOSIS — Z1329 Encounter for screening for other suspected endocrine disorder: Secondary | ICD-10-CM

## 2016-08-03 DIAGNOSIS — Z13220 Encounter for screening for lipoid disorders: Secondary | ICD-10-CM

## 2016-08-03 DIAGNOSIS — I2583 Coronary atherosclerosis due to lipid rich plaque: Secondary | ICD-10-CM

## 2016-08-03 DIAGNOSIS — I10 Essential (primary) hypertension: Secondary | ICD-10-CM

## 2016-08-03 DIAGNOSIS — Z125 Encounter for screening for malignant neoplasm of prostate: Secondary | ICD-10-CM

## 2016-08-03 DIAGNOSIS — I2510 Atherosclerotic heart disease of native coronary artery without angina pectoris: Secondary | ICD-10-CM | POA: Diagnosis not present

## 2016-08-03 DIAGNOSIS — S78111D Complete traumatic amputation at level between right hip and knee, subsequent encounter: Secondary | ICD-10-CM

## 2016-08-03 DIAGNOSIS — Z7189 Other specified counseling: Secondary | ICD-10-CM

## 2016-08-03 LAB — URINALYSIS, ROUTINE W REFLEX MICROSCOPIC
Bilirubin, UA: NEGATIVE
Glucose, UA: NEGATIVE
Leukocytes, UA: NEGATIVE
Nitrite, UA: NEGATIVE
RBC, UA: NEGATIVE
Specific Gravity, UA: 1.02 (ref 1.005–1.030)
Urobilinogen, Ur: 2 mg/dL — ABNORMAL HIGH (ref 0.2–1.0)
pH, UA: 7 (ref 5.0–7.5)

## 2016-08-03 LAB — MICROSCOPIC EXAMINATION
Bacteria, UA: NONE SEEN
RBC, UA: NONE SEEN /hpf (ref 0–?)

## 2016-08-03 MED ORDER — LOSARTAN POTASSIUM 100 MG PO TABS: 100.0000 mg | ORAL_TABLET | Freq: Every day | ORAL | 4 refills | Status: DC

## 2016-08-03 NOTE — Assessment & Plan Note (Signed)
The current medical regimen is effective;  continue present plan and medications.  

## 2016-08-03 NOTE — Assessment & Plan Note (Signed)
A voluntary discussion about advance care planning including the explanation and discussion of advance directives was extensively discussed  with the patient.  Explanation about the health care proxy and Living will was reviewed and packet with forms with explanation of how to fill them out was given.  .  Patient was offered a separate Advance Care Planning visit for further assistance with forms.    

## 2016-08-03 NOTE — Progress Notes (Signed)
BP 136/70 (BP Location: Left Arm)   Pulse (!) 53   Ht  (1.651 m)   Wt 163 lb (73.9 kg)   SpO2 98%   BMI 27.12 kg/m    Subjective:    Patient ID: Carl Bryan, male    DOB: Jul 25, 1945, 71 y.o.   MRN: [id number]  HPI: Carl Bryan is a 71 y.o. male  Chief Complaint  Patient presents with  . Annual Exam  . Toe Pain    Left great toe.   Patient follow-up has fungal changes of his left great toenail which is heaping up and gets tender. Is gotten big before and was removed with relief for a while. Is not taking Jardiance because it's $300 for a month supply. Blood pressures doing well Diabetes noted low blood sugar spells No cardiac symptoms chest pain chest tightness Relevant past medical, surgical, family and social history reviewed and updated as indicated. Interim medical history since our last visit reviewed. Allergies and medications reviewed and updated.  Review of Systems  Constitutional: Negative.   HENT: Negative.   Eyes: Negative.   Respiratory: Negative.   Cardiovascular: Negative.   Gastrointestinal: Negative.   Endocrine: Negative.   Genitourinary: Negative.   Musculoskeletal: Negative.   Skin: Negative.   Allergic/Immunologic: Negative.   Neurological: Negative.   Hematological: Negative.   Psychiatric/Behavioral: Negative.     Per HPI unless specifically indicated above     Objective:    BP 136/70 (BP Location: Left Arm)   Pulse (!) 53   Ht  (1.651 m)   Wt 163 lb (73.9 kg)   SpO2 98%   BMI 27.12 kg/m   Wt Readings from Last 3 Encounters:  08/03/16 163 lb (73.9 kg)  05/15/16 173 lb (78.5 kg)  01/19/16 172 lb (78 kg)    Physical Exam  Constitutional: He is oriented to person, place, and time. He appears well-developed and well-nourished.  HENT:  Head: Normocephalic and atraumatic.  Right Ear: External ear normal.  Left Ear: External ear normal.  Eyes: Conjunctivae and EOM are normal. Pupils are equal, round, and reactive to light.    Neck: Normal range of motion. Neck supple.  Cardiovascular: Normal rate, regular rhythm, normal heart sounds and intact distal pulses.   Pulmonary/Chest: Effort normal and breath sounds normal.  Abdominal: Soft. Bowel sounds are normal. There is no splenomegaly or hepatomegaly.  Genitourinary: Rectum normal, prostate normal and penis normal.  Musculoskeletal: Normal range of motion.  Neurological: He is alert and oriented to person, place, and time. He has normal reflexes.  Skin: No rash noted. No erythema.  Psychiatric: He has a normal mood and affect. His behavior is normal. Judgment and thought content normal.    Results for orders placed or performed in visit on 05/15/16  Bayer DCA Hb A1c Waived (STAT)  Result Value Ref Range   Bayer DCA Hb A1c Waived 9.5 (H) <7.0 %      Assessment & Plan:   Problem List Items Addressed This Visit      Cardiovascular and Mediastinum   Essential (primary) hypertension    The current medical regimen is effective;  continue present plan and medications.       Relevant Medications   losartan (COZAAR) 100 MG tablet   Other Relevant Orders   CBC with Differential/Platelet   Comprehensive metabolic panel   Urinalysis, Routine w reflex microscopic   CAD (coronary artery disease)    The current medical regimen is effective;  continue present plan and medications.       Relevant Medications   losartan (COZAAR) 100 MG tablet     Endocrine   Type 2 diabetes mellitus (HCC)    Discussed diabetes poor control gave right out of 3 medicines he may try if he is unable to get those a reasonable cost patient will contact us for further suggestions for diabetes control. Reviewed importance of managing diabetes properly.      Relevant Medications   losartan (COZAAR) 100 MG tablet   Other Relevant Orders   CBC with Differential/Platelet   Comprehensive metabolic panel   Urinalysis, Routine w reflex microscopic     Other   Pure  hypercholesterolemia    The current medical regimen is effective;  continue present plan and medications.       Relevant Medications   losartan (COZAAR) 100 MG tablet   Other Relevant Orders   CBC with Differential/Platelet   Comprehensive metabolic panel   Lipid panel   Urinalysis, Routine w reflex microscopic   Traumatic amputation of leg above knee (HCC)    Stable uses crutches and limb      Advance care planning    A voluntary discussion about advance care planning including the explanation and discussion of advance directives was extensively discussed  with the patient.  Explanation about the health care proxy and Living will was reviewed and packet with forms with explanation of how to fill them out was given.    Patient was offered a separate Advance Care Planning visit for further assistance with forms.          Other Visit Diagnoses    Annual physical exam    -  Primary   Relevant Orders   CBC with Differential/Platelet   Comprehensive metabolic panel   Lipid panel   PSA   TSH   Urinalysis, Routine w reflex microscopic   Screening cholesterol level       Relevant Orders   Lipid panel   Prostate cancer screening       Relevant Orders   PSA   Thyroid disorder screen       Relevant Orders   TSH       Follow up plan: Return in about 2 months (around 10/03/2016) for Hemoglobin A1c.

## 2016-08-03 NOTE — Assessment & Plan Note (Signed)
Stable uses crutches and limb

## 2016-08-03 NOTE — Assessment & Plan Note (Signed)
Discussed diabetes poor control gave right out of 3 medicines he may try if he is unable to get those a reasonable cost patient will contact us for further suggestions for diabetes control. Reviewed importance of managing diabetes properly.

## 2016-08-04 LAB — CBC WITH DIFFERENTIAL/PLATELET
Basophils Absolute: 0 10*3/uL (ref 0.0–0.2)
Basos: 0 %
EOS (ABSOLUTE): 0.2 10*3/uL (ref 0.0–0.4)
Eos: 3 %
Hematocrit: 43.6 % (ref 37.5–51.0)
Hemoglobin: 14.8 g/dL (ref 13.0–17.7)
Immature Grans (Abs): 0 10*3/uL (ref 0.0–0.1)
Immature Granulocytes: 0 %
Lymphocytes Absolute: 2 10*3/uL (ref 0.7–3.1)
Lymphs: 26 %
MCH: 31.8 pg (ref 26.6–33.0)
MCHC: 33.9 g/dL (ref 31.5–35.7)
MCV: 94 fL (ref 79–97)
Monocytes Absolute: 0.5 10*3/uL (ref 0.1–0.9)
Monocytes: 7 %
Neutrophils Absolute: 5 10*3/uL (ref 1.4–7.0)
Neutrophils: 64 %
Platelets: 224 10*3/uL (ref 150–379)
RBC: 4.65 x10E6/uL (ref 4.14–5.80)
RDW: 13.7 % (ref 12.3–15.4)
WBC: 7.7 10*3/uL (ref 3.4–10.8)

## 2016-08-04 LAB — COMPREHENSIVE METABOLIC PANEL
ALT: 26 IU/L (ref 0–44)
AST: 16 IU/L (ref 0–40)
Albumin/Globulin Ratio: 2 (ref 1.2–2.2)
Albumin: 4.5 g/dL (ref 3.5–4.8)
Alkaline Phosphatase: 48 IU/L (ref 39–117)
BUN/Creatinine Ratio: 14 (ref 10–24)
BUN: 15 mg/dL (ref 8–27)
Bilirubin Total: 0.7 mg/dL (ref 0.0–1.2)
CO2: 24 mmol/L (ref 18–29)
Calcium: 10.2 mg/dL (ref 8.6–10.2)
Chloride: 100 mmol/L (ref 96–106)
Creatinine, Ser: 1.1 mg/dL (ref 0.76–1.27)
GFR calc Af Amer: 77 mL/min/{1.73_m2} (ref >59)
GFR calc non Af Amer: 67 mL/min/{1.73_m2} (ref >59)
Globulin, Total: 2.2 g/dL (ref 1.5–4.5)
Glucose: 132 mg/dL — ABNORMAL HIGH (ref 65–99)
Potassium: 4.2 mmol/L (ref 3.5–5.2)
Sodium: 140 mmol/L (ref 134–144)
Total Protein: 6.7 g/dL (ref 6.0–8.5)

## 2016-08-04 LAB — LIPID PANEL
Chol/HDL Ratio: 3.8 ratio (ref 0.0–5.0)
Cholesterol, Total: 115 mg/dL (ref 100–199)
HDL: 30 mg/dL — ABNORMAL LOW (ref >39)
LDL Calculated: 42 mg/dL (ref 0–99)
Triglycerides: 213 mg/dL — ABNORMAL HIGH (ref 0–149)
VLDL Cholesterol Cal: 43 mg/dL — ABNORMAL HIGH (ref 5–40)

## 2016-08-04 LAB — PSA: Prostate Specific Ag, Serum: 1.5 ng/mL (ref 0.0–4.0)

## 2016-08-04 LAB — TSH: TSH: 1.54 u[IU]/mL (ref 0.450–4.500)

## 2016-08-07 ENCOUNTER — Encounter: Payer: Self-pay | Admitting: Family Medicine

## 2016-10-04 ENCOUNTER — Encounter: Payer: Self-pay | Admitting: Family Medicine

## 2016-10-04 ENCOUNTER — Ambulatory Visit (INDEPENDENT_AMBULATORY_CARE_PROVIDER_SITE_OTHER): Payer: Medicare PPO | Admitting: Family Medicine

## 2016-10-04 VITALS — BP 129/71 | HR 45 | Temp 97.7°F | Wt 170.0 lb

## 2016-10-04 DIAGNOSIS — I10 Essential (primary) hypertension: Secondary | ICD-10-CM

## 2016-10-04 DIAGNOSIS — S78111D Complete traumatic amputation at level between right hip and knee, subsequent encounter: Secondary | ICD-10-CM

## 2016-10-04 DIAGNOSIS — E7800 Pure hypercholesterolemia, unspecified: Secondary | ICD-10-CM

## 2016-10-04 DIAGNOSIS — E119 Type 2 diabetes mellitus without complications: Secondary | ICD-10-CM | POA: Diagnosis not present

## 2016-10-04 LAB — BAYER DCA HB A1C WAIVED: HB A1C (BAYER DCA - WAIVED): 6.3 % (ref <7.0)

## 2016-10-04 NOTE — Assessment & Plan Note (Signed)
Doing well no c/o

## 2016-10-04 NOTE — Assessment & Plan Note (Signed)
The current medical regimen is effective;  continue present plan and medications.  

## 2016-10-04 NOTE — Progress Notes (Signed)
BP 129/71 (BP Location: Left Arm, Patient Position: Sitting, Cuff Size: Normal)   Pulse (!) 45   Temp 97.7 F (36.5 C)   Wt 170 lb (77.1 kg) Comment: Using Crutches  SpO2 95%   BMI 28.29 kg/m    Subjective:    Patient ID: Carl Bryan, male    DOB: [birth year], 71 y.o.   MRN: [id number]  HPI: Carl Bryan is a 71 y.o. male  Chief Complaint  Patient presents with  . Diabetes   Patient follow-up diabetes doing well noted low blood sugar spells doing well with medications no side effects. No gout blood pressure cholesterol issues takes medications faithfully without problems and good introital Relevant past medical, surgical, family and social history reviewed and updated as indicated. Interim medical history since our last visit reviewed. Allergies and medications reviewed and updated.  Review of Systems  Constitutional: Negative.   Respiratory: Negative.   Cardiovascular: Negative.     Per HPI unless specifically indicated above     Objective:    BP 129/71 (BP Location: Left Arm, Patient Position: Sitting, Cuff Size: Normal)   Pulse (!) 45   Temp 97.7 F (36.5 C)   Wt 170 lb (77.1 kg) Comment: Using Crutches  SpO2 95%   BMI 28.29 kg/m   Wt Readings from Last 3 Encounters:  10/04/16 170 lb (77.1 kg)  08/03/16 163 lb (73.9 kg)  05/15/16 173 lb (78.5 kg)    Physical Exam  Constitutional: He is oriented to person, place, and time. He appears well-developed and well-nourished.  HENT:  Head: Normocephalic and atraumatic.  Eyes: Conjunctivae and EOM are normal.  Neck: Normal range of motion.  Cardiovascular: Normal rate, regular rhythm and normal heart sounds.   Pulmonary/Chest: Effort normal and breath sounds normal.  Musculoskeletal: Normal range of motion.  Neurological: He is alert and oriented to person, place, and time.  Skin: No erythema.  Psychiatric: He has a normal mood and affect. His behavior is normal. Judgment and thought content normal.    Results  for orders placed or performed in visit on 08/03/16  Microscopic Examination  Result Value Ref Range   WBC, UA 0-5 0 - 5 /hpf   RBC, UA None seen 0 - 2 /hpf   Epithelial Cells (non renal) CANCELED    Bacteria, UA None seen None seen/Few  CBC with Differential/Platelet  Result Value Ref Range   WBC 7.7 3.4 - 10.8 x10E3/uL   RBC 4.65 4.14 - 5.80 x10E6/uL   Hemoglobin 14.8 13.0 - 17.7 g/dL   Hematocrit 78.2 95.6 - 51.0 %   MCV 94 79 - 97 fL   MCH 31.8 26.6 - 33.0 pg   MCHC 33.9 31.5 - 35.7 g/dL   RDW 21.3 08.6 - 57.8 %   Platelets 224 150 - 379 x10E3/uL   Neutrophils 64 Not Estab. %   Lymphs 26 Not Estab. %   Monocytes 7 Not Estab. %   Eos 3 Not Estab. %   Basos 0 Not Estab. %   Neutrophils Absolute 5.0 1.4 - 7.0 x10E3/uL   Lymphocytes Absolute 2.0 0.7 - 3.1 x10E3/uL   Monocytes Absolute 0.5 0.1 - 0.9 x10E3/uL   EOS (ABSOLUTE) 0.2 0.0 - 0.4 x10E3/uL   Basophils Absolute 0.0 0.0 - 0.2 x10E3/uL   Immature Granulocytes 0 Not Estab. %   Immature Grans (Abs) 0.0 0.0 - 0.1 x10E3/uL  Comprehensive metabolic panel  Result Value Ref Range   Glucose 132 (H) 65 - 99  mg/dL   BUN 15 8 - 27 mg/dL   Creatinine, Ser 8.111.10 0.76 - 1.27 mg/dL   GFR calc non Af Amer 67 >59 mL/min/1.73   GFR calc Af Amer 77 >59 mL/min/1.73   BUN/Creatinine Ratio 14 10 - 24   Sodium 140 134 - 144 mmol/L   Potassium 4.2 3.5 - 5.2 mmol/L   Chloride 100 96 - 106 mmol/L   CO2 24 18 - 29 mmol/L   Calcium 10.2 8.6 - 10.2 mg/dL   Total Protein 6.7 6.0 - 8.5 g/dL   Albumin 4.5 3.5 - 4.8 g/dL   Globulin, Total 2.2 1.5 - 4.5 g/dL   Albumin/Globulin Ratio 2.0 1.2 - 2.2   Bilirubin Total 0.7 0.0 - 1.2 mg/dL   Alkaline Phosphatase 48 39 - 117 IU/L   AST 16 0 - 40 IU/L   ALT 26 0 - 44 IU/L  Lipid panel  Result Value Ref Range   Cholesterol, Total 115 100 - 199 mg/dL   Triglycerides 914213 (H) 0 - 149 mg/dL   HDL 30 (L) >78>39 mg/dL   VLDL Cholesterol Cal 43 (H) 5 - 40 mg/dL   LDL Calculated 42 0 - 99 mg/dL   Chol/HDL  Ratio 3.8 0.0 - 5.0 ratio  PSA  Result Value Ref Range   Prostate Specific Ag, Serum 1.5 0.0 - 4.0 ng/mL  TSH  Result Value Ref Range   TSH 1.540 0.450 - 4.500 uIU/mL  Urinalysis, Routine w reflex microscopic  Result Value Ref Range   Specific Gravity, UA 1.020 1.005 - 1.030   pH, UA 7.0 5.0 - 7.5   Color, UA Yellow Yellow   Appearance Ur Clear Clear   Leukocytes, UA Negative Negative   Protein, UA 2+ (A) Negative/Trace   Glucose, UA Negative Negative   Ketones, UA Trace (A) Negative   RBC, UA Negative Negative   Bilirubin, UA Negative Negative   Urobilinogen, Ur 2.0 (H) 0.2 - 1.0 mg/dL   Nitrite, UA Negative Negative   Microscopic Examination See below:       Assessment & Plan:   Problem List Items Addressed This Visit      Cardiovascular and Mediastinum   Essential (primary) hypertension    The current medical regimen is effective;  continue present plan and medications.         Endocrine   Type 2 diabetes mellitus (HCC) - Primary    The current medical regimen is effective;  continue present plan and medications.       Relevant Orders   Bayer DCA Hb A1c Waived     Other   Pure hypercholesterolemia    The current medical regimen is effective;  continue present plan and medications.       Traumatic amputation of leg above knee (HCC)    Doing well no c/o          Follow up plan: Return in about 3 months (around 01/04/2017) for Hemoglobin A1c, BMP,  Lipids, ALT, AST.

## 2017-01-17 ENCOUNTER — Ambulatory Visit (INDEPENDENT_AMBULATORY_CARE_PROVIDER_SITE_OTHER): Payer: Medicare PPO | Admitting: Family Medicine

## 2017-01-17 ENCOUNTER — Encounter: Payer: Self-pay | Admitting: Family Medicine

## 2017-01-17 VITALS — BP 138/78 | HR 66 | Wt 162.0 lb

## 2017-01-17 DIAGNOSIS — E7800 Pure hypercholesterolemia, unspecified: Secondary | ICD-10-CM

## 2017-01-17 DIAGNOSIS — Z23 Encounter for immunization: Secondary | ICD-10-CM

## 2017-01-17 DIAGNOSIS — I2510 Atherosclerotic heart disease of native coronary artery without angina pectoris: Secondary | ICD-10-CM

## 2017-01-17 DIAGNOSIS — E119 Type 2 diabetes mellitus without complications: Secondary | ICD-10-CM | POA: Diagnosis not present

## 2017-01-17 DIAGNOSIS — I2583 Coronary atherosclerosis due to lipid rich plaque: Secondary | ICD-10-CM

## 2017-01-17 DIAGNOSIS — I10 Essential (primary) hypertension: Secondary | ICD-10-CM

## 2017-01-17 LAB — LP+ALT+AST PICCOLO, WAIVED
ALT (SGPT) Piccolo, Waived: 39 U/L (ref 10–47)
AST (SGOT) Piccolo, Waived: 38 U/L (ref 11–38)
Chol/HDL Ratio Piccolo,Waive: 3.6 mg/dL
Cholesterol Piccolo, Waived: 121 mg/dL (ref <200)
HDL Chol Piccolo, Waived: 34 mg/dL — ABNORMAL LOW (ref >59)
LDL Chol Calc Piccolo Waived: 57 mg/dL (ref <100)
Triglycerides Piccolo,Waived: 152 mg/dL — ABNORMAL HIGH (ref <150)
VLDL Chol Calc Piccolo,Waive: 30 mg/dL — ABNORMAL HIGH (ref <30)

## 2017-01-17 LAB — BAYER DCA HB A1C WAIVED: HB A1C (BAYER DCA - WAIVED): 6.6 % (ref <7.0)

## 2017-01-17 NOTE — Progress Notes (Signed)
   BP (!) 147/77   Pulse 66   Wt 162 lb (73.5 kg)   SpO2 98%   BMI 26.96 kg/m    Subjective:    Patient ID: Carl Bryan, male    DOB: [birth year], 71 y.o.   MRN: [id number]  HPI: Carl Bryan is a 71 y.o. male  Chief Complaint  Patient presents with  . Follow-up  . Diabetes  . Hypertension  . Hyperlipidemia  Patient all in all doing well diabetes noted low blood sugar spells or issues takes medications faithfully without problems. Same with blood pressure cholesterol medications takes without problems and with good effects taking faithfully.  Relevant past medical, surgical, family and social history reviewed and updated as indicated. Interim medical history since our last visit reviewed. Allergies and medications reviewed and updated.  Review of Systems  Constitutional: Negative.   Respiratory: Negative.   Cardiovascular: Negative.     Per HPI unless specifically indicated above     Objective:    BP (!) 147/77   Pulse 66   Wt 162 lb (73.5 kg)   SpO2 98%   BMI 26.96 kg/m   Wt Readings from Last 3 Encounters:  01/17/17 162 lb (73.5 kg)  10/04/16 170 lb (77.1 kg)  08/03/16 163 lb (73.9 kg)    Physical Exam  Constitutional: He is oriented to person, place, and time. He appears well-developed and well-nourished.  HENT:  Head: Normocephalic and atraumatic.  Eyes: Conjunctivae and EOM are normal.  Neck: Normal range of motion.  Cardiovascular: Normal rate, regular rhythm and normal heart sounds.   Pulmonary/Chest: Effort normal and breath sounds normal.  Musculoskeletal: Normal range of motion.  Neurological: He is alert and oriented to person, place, and time.  Skin: No erythema.  Psychiatric: He has a normal mood and affect. His behavior is normal. Judgment and thought content normal.    Results for orders placed or performed in visit on 10/04/16  Bayer DCA Hb A1c Waived  Result Value Ref Range   Bayer DCA Hb A1c Waived 6.3 <7.0 %      Assessment & Plan:    Problem List Items Addressed This Visit      Cardiovascular and Mediastinum   Essential (primary) hypertension - Primary   Relevant Orders   Bayer DCA Hb A1c Waived   Basic metabolic panel   LP+ALT+AST Piccolo, Waived     Endocrine   Type 2 diabetes mellitus (HCC)   Relevant Orders   Bayer DCA Hb A1c Waived   Basic metabolic panel   LP+ALT+AST Piccolo, Waived     Other   Pure hypercholesterolemia   Relevant Orders   Bayer DCA Hb A1c Waived   Basic metabolic panel   LP+ALT+AST Piccolo, Waived    Other Visit Diagnoses    Needs flu shot       Relevant Orders   Flu vaccine HIGH DOSE PF (Completed)       Follow up plan: No Follow-up on file.

## 2017-01-17 NOTE — Assessment & Plan Note (Signed)
The current medical regimen is effective;  continue present plan and medications.  

## 2017-01-18 ENCOUNTER — Encounter: Payer: Self-pay | Admitting: Family Medicine

## 2017-01-18 LAB — BASIC METABOLIC PANEL
BUN/Creatinine Ratio: 16 (ref 10–24)
BUN: 13 mg/dL (ref 8–27)
CO2: 20 mmol/L (ref 20–29)
Calcium: 9.9 mg/dL (ref 8.6–10.2)
Chloride: 102 mmol/L (ref 96–106)
Creatinine, Ser: 0.83 mg/dL (ref 0.76–1.27)
GFR calc Af Amer: 102 mL/min/{1.73_m2} (ref >59)
GFR calc non Af Amer: 88 mL/min/{1.73_m2} (ref >59)
Glucose: 119 mg/dL — ABNORMAL HIGH (ref 65–99)
Potassium: 4.4 mmol/L (ref 3.5–5.2)
Sodium: 138 mmol/L (ref 134–144)

## 2017-04-18 ENCOUNTER — Other Ambulatory Visit: Payer: Self-pay | Admitting: Family Medicine

## 2017-04-18 MED ORDER — ACCU-CHEK AVIVA PLUS VI STRP: ORAL_STRIP | 12 refills | Status: DC

## 2017-04-19 ENCOUNTER — Ambulatory Visit: Payer: Medicare PPO | Admitting: Family Medicine

## 2017-04-24 ENCOUNTER — Ambulatory Visit (INDEPENDENT_AMBULATORY_CARE_PROVIDER_SITE_OTHER): Payer: Medicare PPO | Admitting: Family Medicine

## 2017-04-24 ENCOUNTER — Encounter: Payer: Self-pay | Admitting: Family Medicine

## 2017-04-24 VITALS — BP 156/74 | HR 68 | Wt 167.0 lb

## 2017-04-24 DIAGNOSIS — E7800 Pure hypercholesterolemia, unspecified: Secondary | ICD-10-CM | POA: Diagnosis not present

## 2017-04-24 DIAGNOSIS — I10 Essential (primary) hypertension: Secondary | ICD-10-CM

## 2017-04-24 DIAGNOSIS — E119 Type 2 diabetes mellitus without complications: Secondary | ICD-10-CM | POA: Diagnosis not present

## 2017-04-24 MED ORDER — AMLODIPINE BESYLATE 10 MG PO TABS: 10.0000 mg | ORAL_TABLET | Freq: Every day | ORAL | 1 refills | Status: DC

## 2017-04-24 NOTE — Assessment & Plan Note (Signed)
The current medical regimen is effective;  continue present plan and medications.  

## 2017-04-24 NOTE — Assessment & Plan Note (Signed)
Poor control of blood pressure will increase amlodipine from 5 mg increased to 10 mg and take amlodipine and losartan in the morning instead of evening.

## 2017-04-24 NOTE — Progress Notes (Signed)
BP (!) 156/74   Pulse 68   Wt 167 lb (75.8 kg)   SpO2 97%   BMI 27.79 kg/m    Subjective:    Patient ID: Carl Bryan, male    DOB: 12/19/1945, 72 y.o.   MRN: [id number]  HPI: Carl Bryan is a 72 y.o. male  Chief Complaint  Patient presents with  . Diabetes  . Hypertension  . Hyperlipidemia  Patient follow-up all in all doing well no complaints from blood sugar with good control no issues with medications taking cholesterol and blood pressure also faithfully. Been taking blood pressure medications at night without problems.  Relevant past medical, surgical, family and social history reviewed and updated as indicated. Interim medical history since our last visit reviewed. Allergies and medications reviewed and updated.  Review of Systems  Constitutional: Negative.   Respiratory: Negative.   Cardiovascular: Negative.     Per HPI unless specifically indicated above     Objective:    BP (!) 156/74   Pulse 68   Wt 167 lb (75.8 kg)   SpO2 97%   BMI 27.79 kg/m   Wt Readings from Last 3 Encounters:  04/24/17 167 lb (75.8 kg)  01/17/17 162 lb (73.5 kg)  10/04/16 170 lb (77.1 kg)    Physical Exam  Constitutional: He is oriented to person, place, and time. He appears well-developed and well-nourished.  HENT:  Head: Normocephalic and atraumatic.  Eyes: Conjunctivae and EOM are normal.  Neck: Normal range of motion.  Cardiovascular: Normal rate, regular rhythm and normal heart sounds.  Pulmonary/Chest: Effort normal and breath sounds normal.  Musculoskeletal: Normal range of motion.  Neurological: He is alert and oriented to person, place, and time.  Skin: No erythema.  Psychiatric: He has a normal mood and affect. His behavior is normal. Judgment and thought content normal.    Results for orders placed or performed in visit on 01/17/17  Bayer DCA Hb A1c Waived  Result Value Ref Range   Bayer DCA Hb A1c Waived 6.6 <7.0 %  Basic metabolic panel  Result Value Ref  Range   Glucose 119 (H) 65 - 99 mg/dL   BUN 13 8 - 27 mg/dL   Creatinine, Ser 4.090.83 0.76 - 1.27 mg/dL   GFR calc non Af Amer 88 >59 mL/min/1.73   GFR calc Af Amer 102 >59 mL/min/1.73   BUN/Creatinine Ratio 16 10 - 24   Sodium 138 134 - 144 mmol/L   Potassium 4.4 3.5 - 5.2 mmol/L   Chloride 102 96 - 106 mmol/L   CO2 20 20 - 29 mmol/L   Calcium 9.9 8.6 - 10.2 mg/dL  LP+ALT+AST Piccolo, Waived  Result Value Ref Range   ALT (SGPT) Piccolo, Waived 39 10 - 47 U/L   AST (SGOT) Piccolo, Waived 38 11 - 38 U/L   Cholesterol Piccolo, Waived 121 <200 mg/dL   HDL Chol Piccolo, Waived 34 (L) >59 mg/dL   Triglycerides Piccolo,Waived 152 (H) <150 mg/dL   Chol/HDL Ratio Piccolo,Waive 3.6 mg/dL   LDL Chol Calc Piccolo Waived 57 <100 mg/dL   VLDL Chol Calc Piccolo,Waive 30 (H) <30 mg/dL      Assessment & Plan:   Problem List Items Addressed This Visit      Cardiovascular and Mediastinum   Essential (primary) hypertension - Primary    Poor control of blood pressure will increase amlodipine from 5 mg increased to 10 mg and take amlodipine and losartan in the morning instead of evening.  Relevant Medications   amLODipine (NORVASC) 10 MG tablet   Other Relevant Orders   Basic metabolic panel   Bayer DCA Hb X0R Waived   LP+ALT+AST Piccolo, Waived     Endocrine   Type 2 diabetes mellitus (HCC)    The current medical regimen is effective;  continue present plan and medications.       Relevant Orders   Basic metabolic panel   Bayer DCA Hb U0A Waived   LP+ALT+AST Piccolo, Waived     Other   Pure hypercholesterolemia    The current medical regimen is effective;  continue present plan and medications.       Relevant Medications   amLODipine (NORVASC) 10 MG tablet   Other Relevant Orders   Basic metabolic panel   Bayer DCA Hb V4U Waived   LP+ALT+AST Piccolo, Waived       Follow up plan: Return in about 3 months (around 07/23/2017) for Hemoglobin A1c.

## 2017-04-25 ENCOUNTER — Encounter: Payer: Self-pay | Admitting: Family Medicine

## 2017-04-25 LAB — BASIC METABOLIC PANEL
BUN/Creatinine Ratio: 15 (ref 10–24)
BUN: 13 mg/dL (ref 8–27)
CO2: 17 mmol/L — ABNORMAL LOW (ref 20–29)
Calcium: 9.6 mg/dL (ref 8.6–10.2)
Chloride: 105 mmol/L (ref 96–106)
Creatinine, Ser: 0.89 mg/dL (ref 0.76–1.27)
GFR calc Af Amer: 98 mL/min/{1.73_m2} (ref >59)
GFR calc non Af Amer: 85 mL/min/{1.73_m2} (ref >59)
Glucose: 122 mg/dL — ABNORMAL HIGH (ref 65–99)
Potassium: 4.6 mmol/L (ref 3.5–5.2)
Sodium: 141 mmol/L (ref 134–144)

## 2017-04-25 LAB — LP+ALT+AST PICCOLO, WAIVED
ALT (SGPT) Piccolo, Waived: 38 U/L (ref 10–47)
AST (SGOT) Piccolo, Waived: 35 U/L (ref 11–38)
Chol/HDL Ratio Piccolo,Waive: 3.3 mg/dL
Cholesterol Piccolo, Waived: 115 mg/dL (ref <200)
HDL Chol Piccolo, Waived: 35 mg/dL — ABNORMAL LOW (ref >59)
LDL Chol Calc Piccolo Waived: 44 mg/dL (ref <100)
Triglycerides Piccolo,Waived: 181 mg/dL — ABNORMAL HIGH (ref <150)
VLDL Chol Calc Piccolo,Waive: 36 mg/dL — ABNORMAL HIGH (ref <30)

## 2017-04-25 LAB — BAYER DCA HB A1C WAIVED: HB A1C (BAYER DCA - WAIVED): 6.7 % (ref <7.0)

## 2017-06-11 ENCOUNTER — Other Ambulatory Visit: Payer: Self-pay | Admitting: Family Medicine

## 2017-06-11 DIAGNOSIS — E119 Type 2 diabetes mellitus without complications: Secondary | ICD-10-CM

## 2017-06-12 NOTE — Telephone Encounter (Signed)
Metformin refill LR:    05/15/16 #360 tabs 4 RF LOV:  04/24/17 PCP:  Dr Dossie Arbourrissman Pharmacy:   [facility]

## 2017-06-20 ENCOUNTER — Ambulatory Visit (INDEPENDENT_AMBULATORY_CARE_PROVIDER_SITE_OTHER): Payer: Medicare HMO | Admitting: Family Medicine

## 2017-06-20 ENCOUNTER — Encounter: Payer: Self-pay | Admitting: Family Medicine

## 2017-06-20 VITALS — BP 151/67 | HR 70 | Temp 99.5°F

## 2017-06-20 DIAGNOSIS — J069 Acute upper respiratory infection, unspecified: Secondary | ICD-10-CM | POA: Diagnosis not present

## 2017-06-20 DIAGNOSIS — M25562 Pain in left knee: Secondary | ICD-10-CM

## 2017-06-20 MED ORDER — FLUTICASONE PROPIONATE 50 MCG/ACT NA SUSP: 2.0000 | Freq: Two times a day (BID) | NASAL | 6 refills | Status: DC

## 2017-06-20 MED ORDER — AMOXICILLIN-POT CLAVULANATE 875-125 MG PO TABS: 1.0000 | ORAL_TABLET | Freq: Two times a day (BID) | ORAL | 0 refills | Status: DC

## 2017-06-20 NOTE — Progress Notes (Signed)
BP (!) 151/67 (BP Location: Left Arm, Patient Position: Sitting, Cuff Size: Normal)   Pulse 70   Temp 99.5 F (37.5 C) (Temporal)   SpO2 95%    Subjective:    Patient ID: Carl Bryan, male    DOB: [birth year], 72 y.o.   MRN: [id number]  HPI: Carl Bryan is a 72 y.o. male  Chief Complaint  Patient presents with  . Nasal Congestion    x's 3 days. Patient states "his head is stopped up." Denies fever, chills and body aches.   . Knee Pain    Right knee amputated, using crutches. States left knee keeps popping in and out of place.   3 day hx of congestion, headaches, and facial pressure. Taking alka seltzer cold and sinus with minimal relief. Denies cough, CP, SOB, fever, chills, body aches. Lots of sick contacts lately.   Left knee pain and "locking up on him". Hx of bone spurs and torn cartilage in that knee, s/p several surgeries for these. Hx of right leg amputation above knee. No longer followed by orthopedics. Taking tylenol currently with some relief. Denies redness, heat, significant swelling in the joint. Wears a brace.   Past Medical History:  Diagnosis Date  . Bleeding ulcer   . Bronchospasm   . Diabetes mellitus without complication (HCC)    Type 2  . Diverticulosis   . Pure hypercholesterolemia   . Traumatic amputation of leg above knee [facility])    Social History   Socioeconomic History  . Marital status: Married    Spouse name: Not on file  . Number of children: Not on file  . Years of education: Not on file  . Highest education level: Not on file  Social Needs  . Financial resource strain: Not on file  . Food insecurity - worry: Not on file  . Food insecurity - inability: Not on file  . Transportation needs - medical: Not on file  . Transportation needs - non-medical: Not on file  Occupational History  . Occupation: Retired  Tobacco Use  . Smoking status: Former Smoker    Types: Cigarettes    Last attempt to quit: 09/29/1964    Years since quitting: 52.7  .  Smokeless tobacco: Never Used  Substance and Sexual Activity  . Alcohol use: No    Alcohol/week: 0.0 oz  . Drug use: No  . Sexual activity: Not on file  Other Topics Concern  . Not on file  Social History Narrative  . Not on file   Relevant past medical, surgical, family and social history reviewed and updated as indicated. Interim medical history since our last visit reviewed. Allergies and medications reviewed and updated.  Review of Systems  Per HPI unless specifically indicated above     Objective:    BP (!) 151/67 (BP Location: Left Arm, Patient Position: Sitting, Cuff Size: Normal)   Pulse 70   Temp 99.5 F (37.5 C) (Temporal)   SpO2 95%   Wt Readings from Last 3 Encounters:  04/24/17 167 lb (75.8 kg)  01/17/17 162 lb (73.5 kg)  10/04/16 170 lb (77.1 kg)    Physical Exam  Constitutional: He is oriented to person, place, and time. He appears well-developed and well-nourished.  HENT:  Head: Atraumatic.  Right Ear: External ear normal.  Left Ear: External ear normal.  Mouth/Throat: No oropharyngeal exudate.  Nasal mucosa and oropharynx erythematous with drainage present  Eyes: Conjunctivae are normal. Pupils are equal, round, and reactive to light.  Neck: Normal range of motion. Neck supple.  Cardiovascular: Normal rate and normal heart sounds.  Pulmonary/Chest: Effort normal and breath sounds normal. No respiratory distress. He has no wheezes. He has no rales.  Musculoskeletal: He exhibits tenderness. He exhibits no edema.  Right above knee amputation Left knee significant crepitus with PROM Mild joint line tenderness left knee  Neurological: He is alert and oriented to person, place, and time.  Skin: Skin is warm and dry.  Psychiatric: He has a normal mood and affect. His behavior is normal.  Nursing note and vitals reviewed.   Results for orders placed or performed in visit on 04/24/17  Basic metabolic panel  Result Value Ref Range   Glucose 122 (H) 65 -  99 mg/dL   BUN 13 8 - 27 mg/dL   Creatinine, Ser 1.610.89 0.76 - 1.27 mg/dL   GFR calc non Af Amer 85 >59 mL/min/1.73   GFR calc Af Amer 98 >59 mL/min/1.73   BUN/Creatinine Ratio 15 10 - 24   Sodium 141 134 - 144 mmol/L   Potassium 4.6 3.5 - 5.2 mmol/L   Chloride 105 96 - 106 mmol/L   CO2 17 (L) 20 - 29 mmol/L   Calcium 9.6 8.6 - 10.2 mg/dL  Bayer DCA Hb [id number] Waived  Result Value Ref Range   Bayer DCA Hb A1c Waived 6.7 <7.0 %  LP+ALT+AST Piccolo, Waived  Result Value Ref Range   ALT (SGPT) Piccolo, Waived 38 10 - 47 U/L   AST (SGOT) Piccolo, Waived 35 11 - 38 U/L   Cholesterol Piccolo, Waived 115 <200 mg/dL   HDL Chol Piccolo, Waived 35 (L) >59 mg/dL   Triglycerides Piccolo,Waived 181 (H) <150 mg/dL   Chol/HDL Ratio Piccolo,Waive 3.3 mg/dL   LDL Chol Calc Piccolo Waived 44 <100 mg/dL   VLDL Chol Calc Piccolo,Waive 36 (H) <30 mg/dL      Assessment & Plan:   Problem List Items Addressed This Visit    None    Visit Diagnoses    Viral URI    -  Primary   Discussed flonase, sinus rinses, mucinex. Augmentin sent in case worsening or no improvement over the next 5 days or so. F/u if no better   Acute pain of left knee       Will get x-ray of knee, likely refer back to orthopedics for management given significant hx of issues and it being primary weight bearing joint   Relevant Orders   DG Knee Complete 4 Views Left       Follow up plan: Return for as scheduled.

## 2017-06-23 NOTE — Patient Instructions (Signed)
Follow up as scheduled.  

## 2017-07-12 ENCOUNTER — Encounter: Payer: Self-pay | Admitting: Family Medicine

## 2017-07-24 ENCOUNTER — Other Ambulatory Visit: Payer: Self-pay

## 2017-07-24 DIAGNOSIS — E119 Type 2 diabetes mellitus without complications: Secondary | ICD-10-CM

## 2017-07-24 DIAGNOSIS — E7800 Pure hypercholesterolemia, unspecified: Secondary | ICD-10-CM

## 2017-07-24 DIAGNOSIS — M109 Gout, unspecified: Secondary | ICD-10-CM

## 2017-07-24 MED ORDER — ROSUVASTATIN CALCIUM 40 MG PO TABS: 40.0000 mg | ORAL_TABLET | Freq: Every day | ORAL | 0 refills | Status: DC

## 2017-07-24 MED ORDER — ALLOPURINOL 300 MG PO TABS: 300.0000 mg | ORAL_TABLET | Freq: Every day | ORAL | 0 refills | Status: DC

## 2017-07-24 MED ORDER — GLIPIZIDE 5 MG PO TABS: 5.0000 mg | ORAL_TABLET | Freq: Every day | ORAL | 0 refills | Status: DC

## 2017-07-27 DIAGNOSIS — E119 Type 2 diabetes mellitus without complications: Secondary | ICD-10-CM | POA: Diagnosis not present

## 2017-07-27 LAB — HM DIABETES EYE EXAM

## 2017-07-30 ENCOUNTER — Ambulatory Visit (INDEPENDENT_AMBULATORY_CARE_PROVIDER_SITE_OTHER): Payer: Medicare HMO

## 2017-07-30 VITALS — BP 148/76 | HR 62 | Temp 97.8°F | Resp 17 | Ht 66.0 in | Wt 166.3 lb

## 2017-07-30 DIAGNOSIS — Z0000 Encounter for general adult medical examination without abnormal findings: Secondary | ICD-10-CM

## 2017-07-30 NOTE — Progress Notes (Signed)
Subjective:   Carl Bryan is a 72 y.o. male who presents for Medicare Annual/Subsequent preventive examination.  Review of Systems:   Cardiac Risk Factors include: hypertension;male gender;advanced age (>56mn, >>22women);diabetes mellitus;dyslipidemia     Objective:    Vitals: BP (!) 148/76 (BP Location: Left Arm, Patient Position: Sitting)   Pulse 62   Temp 97.8 F (36.6 C) (Temporal)   Resp 17   Ht _0  (1.676 m)   Wt 166 lb 4.8 oz (75.4 kg)   BMI 26.84 kg/m   Body mass index is 26.84 kg/m.  Advanced Directives 07/30/2017  Does Patient Have a Medical Advance Directive? No  Would patient like information on creating a medical advance directive? No - Patient declined    Tobacco Social History   Tobacco Use  Smoking Status Former Smoker  . Types: Cigarettes  . Last attempt to quit: 09/29/1964  . Years since quitting: 52.8  Smokeless Tobacco Never Used     Counseling given: Not Answered   Clinical Intake:  Pre-visit preparation completed: Yes  Pain : 0-10 Pain Score: 8  Pain Type: Chronic pain Pain Location: Back Pain Orientation: Lower Pain Descriptors / Indicators: Aching Pain Onset: In the past 7 days Pain Frequency: Intermittent     Nutritional Status: BMI 25 -29 Overweight Nutritional Risks: None Diabetes: Yes CBG done?: No Did pt. bring in CBG monitor from home?: No  How often do you need to have someone help you when you read instructions, pamphlets, or other written materials from your doctor or pharmacy?: 5 - Always What is the last grade level you completed in school?: 6th grade  Interpreter Needed?: No  Information entered by :: Tiffany Hill,LPN   Past Medical History:  Diagnosis Date  . Bleeding ulcer   . Bronchospasm   . Diabetes mellitus without complication (HCC)    Type 2  . Diverticulosis   . Pure hypercholesterolemia   . Traumatic amputation of leg above knee ([facility]    Past Surgical History:  Procedure Laterality Date  .  ABOVE KNEE LEG AMPUTATION  1971  . APPENDECTOMY    . KNEE SURGERY     x 2.   Family History  Problem Relation Age of Onset  . Cancer Mother        Colon cancer  . Lung disease Father    Social History   Socioeconomic History  . Marital status: Married    Spouse name: Not on file  . Number of children: Not on file  . Years of education: Not on file  . Highest education level: Not on file  Occupational History  . Occupation: Retired  SScientific laboratory technician . Financial resource strain: Not hard at all  . Food insecurity:    Worry: Never true    Inability: Never true  . Transportation needs:    Medical: No    Non-medical: No  Tobacco Use  . Smoking status: Former Smoker    Types: Cigarettes    Last attempt to quit: 09/29/1964    Years since quitting: 52.8  . Smokeless tobacco: Never Used  Substance and Sexual Activity  . Alcohol use: No    Alcohol/week: 0.0 oz  . Drug use: No  . Sexual activity: Not on file  Lifestyle  . Physical activity:    Days per week: 0 days    Minutes per session: 0 min  . Stress: Not at all  Relationships  . Social connections:    Talks on phone:  More than three times a week    Gets together: More than three times a week    Attends religious service: More than 4 times per year    Active member of club or organization: Yes    Attends meetings of clubs or organizations: More than 4 times per year    Relationship status: Married  Other Topics Concern  . Not on file  Social History Narrative  . Not on file    Outpatient Encounter Medications as of 07/30/2017  Medication Sig  . ACCU-CHEK AVIVA PLUS test strip Check 1 time a day  . ACCU-CHEK SOFTCLIX LANCETS lancets   . allopurinol (ZYLOPRIM) 300 MG tablet Take 1 tablet (300 mg total) by mouth daily.  Marland Kitchen amLODipine (NORVASC) 10 MG tablet Take 1 tablet (10 mg total) by mouth daily.  . Blood Glucose Monitoring Suppl (ACCU-CHEK AVIVA PLUS) w/Device KIT   . glipiZIDE (GLUCOTROL) 5 MG tablet Take 1  tablet (5 mg total) by mouth daily before breakfast.  . losartan (COZAAR) 100 MG tablet Take 1 tablet (100 mg total) by mouth daily.  . metFORMIN (GLUCOPHAGE) 500 MG tablet TAKE 2 TABLETS TWICE DAILY WITH A MEAL  . rosuvastatin (CRESTOR) 40 MG tablet Take 1 tablet (40 mg total) by mouth daily.  . fluticasone (FLONASE) 50 MCG/ACT nasal spray Place 2 sprays into both nostrils 2 (two) times daily. (Patient not taking: Reported on 07/30/2017)  . [DISCONTINUED] amoxicillin-clavulanate (AUGMENTIN) 875-125 MG tablet Take 1 tablet by mouth 2 (two) times daily. (Patient not taking: Reported on 07/30/2017)   No facility-administered encounter medications on file as of 07/30/2017.     Activities of Daily Living In your present state of health, do you have any difficulty performing the following activities: 07/30/2017  Hearing? N  Vision? N  Difficulty concentrating or making decisions? N  Walking or climbing stairs? N  Dressing or bathing? N  Doing errands, shopping? N  Preparing Food and eating ? N  Using the Toilet? N  In the past six months, have you accidently leaked urine? N  Do you have problems with loss of bowel control? N  Managing your Medications? N  Managing your Finances? N  Housekeeping or managing your Housekeeping? N  Some recent data might be hidden    Patient Care Team: Guadalupe Maple, MD as PCP - General (Family Medicine)   Assessment:   This is a routine wellness examination for Noris.  Exercise Activities and Dietary recommendations Current Exercise Habits: The patient does not participate in regular exercise at present, Exercise limited by: None identified  Goals    . DIET - INCREASE WATER INTAKE     Recommend continue drinking at least 6-8 glasses of water a day.       Fall Risk Fall Risk  07/30/2017 04/24/2017 01/17/2017 08/03/2016 05/15/2016  Falls in the past year? _0   Number falls in past yr: - - - - -  Injury with Fall? - - - - -   Is the patient's  home free of loose throw rugs in walkways, pet beds, electrical cords, etc?   yes      Grab bars in the bathroom? no      Handrails on the stairs?   no      Adequate lighting?   yes  Timed Get Up and Go Performed: Completed in 9 seconds with use of assistive devices- crutches, steady gait. No intervention needed at this time.   Depression Screen PHQ  2/9 Scores 07/30/2017 04/24/2017 01/17/2017 08/03/2016  PHQ - 2 Score 0 0 0 0    Cognitive Function     6CIT Screen 07/30/2017  What Year? 0 points  What month? 0 points  What time? 0 points  Count back from 20 0 points  Months in reverse 0 points  Repeat phrase 2 points  Total Score 2    Immunization History  Administered Date(s) Administered  . Influenza, High Dose Seasonal PF 01/19/2016, 01/17/2017  . Influenza,inj,Quad PF,6+ Mos 12/31/2014  . Influenza-Unspecified 02/11/2014  . Pneumococcal Conjugate-13 10/08/2013  . Pneumococcal Polysaccharide-23 07/06/2015  . Pneumococcal-Unspecified 05/20/2008  . Td 12/02/2003  . Tdap 10/19/2015    Qualifies for Shingles Vaccine? Yes, discussed shingrix vaccine   Screening Tests Health Maintenance  Topic Date Due  . OPHTHALMOLOGY EXAM  03/10/1956  . FOOT EXAM  03/28/2016  . HEMOGLOBIN A1C  10/22/2017  . INFLUENZA VACCINE  11/08/2017  . COLONOSCOPY  04/10/2022  . TETANUS/TDAP  10/18/2025  . Hepatitis C Screening  Completed  . PNA vac Low Risk Adult  Completed   Cancer Screenings: Lung: Low Dose CT Chest recommended if Age 47-80 years, 30 pack-year currently smoking OR have quit w/in 15years. Patient does not qualify. Colorectal: completed 04/10/2012  Additional Screenings:  Hepatitis C Screening:completed 07/06/2015      Plan:     I have personally reviewed and addressed the Medicare Annual Wellness questionnaire and have noted the following in the patient's chart:  A. Medical and social history B. Use of alcohol, tobacco or illicit drugs  C. Current medications and  supplements D. Functional ability and status E.  Nutritional status F.  Physical activity G. Advance directives H. List of other physicians I.  Hospitalizations, surgeries, and ER visits in previous 12 months J.  [place] such as hearing and vision if needed, cognitive and depression L. Referrals and appointments   In addition, I have reviewed and discussed with patient certain preventive protocols, quality metrics, and best practice recommendations. A written personalized care plan for preventive services as well as general preventive health recommendations were provided to patient.   Signed,  Tyler Aas, LPN Nurse Health Advisor   Nurse Notes: Due for diabetic foot exam- CPE scheduled on 09/26/2017   Had eye exam on 07/27/2017 with Dr.Shapiro- waiting results for diabetic eye exam.

## 2017-07-30 NOTE — Patient Instructions (Addendum)
[name] , Thank you for taking time to come for your Medicare Wellness Visit. I appreciate your ongoing commitment to your health goals. Please review the following plan we discussed and let me know if I can assist you in the future.   Screening recommendations/referrals: Colonoscopy: completed 04/10/2012 Recommended yearly ophthalmology/optometry visit for glaucoma screening and checkup Recommended yearly dental visit for hygiene and checkup  Vaccinations: Influenza vaccine: up to date Pneumococcal vaccine: up to date Tdap vaccine: up to date Shingles vaccine: shingrix vaccine eligible, check with your insurance company for coverage   Advanced directives: Advance directive discussed with you today. Even though you declined this today please call our office should you change your mind and we can give you the proper paperwork for you to fill out.  Conditions/risks identified: Recommend continue drinking at least 6-8 glasses of water a day.  Next appointment: Follow up on 09/26/2017 at 3pm with Dr.Crissman. Follow up in one year for your annual wellness exam.   Preventive Care 65 Years and Older, Male Preventive care refers to lifestyle choices and visits with your health care provider that can promote health and wellness. What does preventive care include?  A yearly physical exam. This is also called an annual well check.  Dental exams once or twice a year.  Routine eye exams. Ask your health care provider how often you should have your eyes checked.  Personal lifestyle choices, including:  Daily care of your teeth and gums.  Regular physical activity.  Eating a healthy diet.  Avoiding tobacco and drug use.  Limiting alcohol use.  Practicing safe sex.  Taking low doses of aspirin every day.  Taking vitamin and mineral supplements as recommended by your health care provider. What happens during an annual well check? The services and screenings done by your health care  provider during your annual well check will depend on your age, overall health, lifestyle risk factors, and family history of disease. Counseling  Your health care provider may ask you questions about your:  Alcohol use.  Tobacco use.  Drug use.  Emotional well-being.  Home and relationship well-being.  Sexual activity.  Eating habits.  History of falls.  Memory and ability to understand (cognition).  Work and work Astronomerenvironment. Screening  You may have the following tests or measurements:  Height, weight, and BMI.  Blood pressure.  Lipid and cholesterol levels. These may be checked every 5 years, or more frequently if you are over 74 years old.  Skin check.  Lung cancer screening. You may have this screening every year starting at age 74 if you have a 30-pack-year history of smoking and currently smoke or have quit within the past 15 years.  Fecal occult blood test (FOBT) of the stool. You may have this test every year starting at age 74.  Flexible sigmoidoscopy or colonoscopy. You may have a sigmoidoscopy every 5 years or a colonoscopy every 10 years starting at age 74.  Prostate cancer screening. Recommendations will vary depending on your family history and other risks.  Hepatitis C blood test.  Hepatitis B blood test.  Sexually transmitted disease (STD) testing.  Diabetes screening. This is done by checking your blood sugar (glucose) after you have not eaten for a while (fasting). You may have this done every 1-3 years.  Abdominal aortic aneurysm (AAA) screening. You may need this if you are a current or former smoker.  Osteoporosis. You may be screened starting at age 74 if you are at high risk.  Talk with your health care provider about your test results, treatment options, and if necessary, the need for more tests. Vaccines  Your health care provider may recommend certain vaccines, such as:  Influenza vaccine. This is recommended every year.  Tetanus,  diphtheria, and acellular pertussis (Tdap, Td) vaccine. You may need a Td booster every 10 years.  Zoster vaccine. You may need this after age 744.  Pneumococcal 13-valent conjugate (PCV13) vaccine. One dose is recommended after age 744.  Pneumococcal polysaccharide (PPSV23) vaccine. One dose is recommended after age 74. Talk to your health care provider about which screenings and vaccines you need and how often you need them. This information is not intended to replace advice given to you by your health care provider. Make sure you discuss any questions you have with your health care provider. Document Released: 04/23/2015 Document Revised: 12/15/2015 Document Reviewed: 01/26/2015 Elsevier Interactive Patient Education  2017 [place] Prevention in the Home Falls can cause injuries. They can happen to people of all ages. There are many things you can do to make your home safe and to help prevent falls. What can I do on the outside of my home?  Regularly fix the edges of walkways and driveways and fix any cracks.  Remove anything that might make you trip as you walk through a door, such as a raised step or threshold.  Trim any bushes or trees on the path to your home.  Use bright outdoor lighting.  Clear any walking paths of anything that might make someone trip, such as rocks or tools.  Regularly check to see if handrails are loose or broken. Make sure that both sides of any steps have handrails.  Any raised decks and porches should have guardrails on the edges.  Have any leaves, snow, or ice cleared regularly.  Use sand or salt on walking paths during winter.  Clean up any spills in your garage right away. This includes oil or grease spills. What can I do in the bathroom?  Use night lights.  Install grab bars by the toilet and in the tub and shower. Do not use towel bars as grab bars.  Use non-skid mats or decals in the tub or shower.  If you need to sit down in  the shower, use a plastic, non-slip stool.  Keep the floor dry. Clean up any water that spills on the floor as soon as it happens.  Remove soap buildup in the tub or shower regularly.  Attach bath mats securely with double-sided non-slip rug tape.  Do not have throw rugs and other things on the floor that can make you trip. What can I do in the bedroom?  Use night lights.  Make sure that you have a light by your bed that is easy to reach.  Do not use any sheets or blankets that are too big for your bed. They should not hang down onto the floor.  Have a firm chair that has side arms. You can use this for support while you get dressed.  Do not have throw rugs and other things on the floor that can make you trip. What can I do in the kitchen?  Clean up any spills right away.  Avoid walking on wet floors.  Keep items that you use a lot in easy-to-reach places.  If you need to reach something above you, use a strong step stool that has a grab bar.  Keep electrical cords out of the way.  Do not use floor polish or wax that makes floors slippery. If you must use wax, use non-skid floor wax.  Do not have throw rugs and other things on the floor that can make you trip. What can I do with my stairs?  Do not leave any items on the stairs.  Make sure that there are handrails on both sides of the stairs and use them. Fix handrails that are broken or loose. Make sure that handrails are as long as the stairways.  Check any carpeting to make sure that it is firmly attached to the stairs. Fix any carpet that is loose or worn.  Avoid having throw rugs at the top or bottom of the stairs. If you do have throw rugs, attach them to the floor with carpet tape.  Make sure that you have a light switch at the top of the stairs and the bottom of the stairs. If you do not have them, ask someone to add them for you. What else can I do to help prevent falls?  Wear shoes that:  Do not have high  heels.  Have rubber bottoms.  Are comfortable and fit you well.  Are closed at the toe. Do not wear sandals.  If you use a stepladder:  Make sure that it is fully opened. Do not climb a closed stepladder.  Make sure that both sides of the stepladder are locked into place.  Ask someone to hold it for you, if possible.  Clearly mark and make sure that you can see:  Any grab bars or handrails.  First and last steps.  Where the edge of each step is.  Use tools that help you move around (mobility aids) if they are needed. These include:  Canes.  Walkers.  Scooters.  Crutches.  Turn on the lights when you go into a dark area. Replace any light bulbs as soon as they burn out.  Set up your furniture so you have a clear path. Avoid moving your furniture around.  If any of your floors are uneven, fix them.  If there are any pets around you, be aware of where they are.  Review your medicines with your doctor. Some medicines can make you feel dizzy. This can increase your chance of falling. Ask your doctor what other things that you can do to help prevent falls. This information is not intended to replace advice given to you by your health care provider. Make sure you discuss any questions you have with your health care provider. Document Released: 01/21/2009 Document Revised: 09/02/2015 Document Reviewed: 05/01/2014 Elsevier Interactive Patient Education  2017 [employer].

## 2017-07-31 ENCOUNTER — Telehealth: Payer: Self-pay | Admitting: Family Medicine

## 2017-07-31 DIAGNOSIS — E7800 Pure hypercholesterolemia, unspecified: Secondary | ICD-10-CM

## 2017-07-31 DIAGNOSIS — E119 Type 2 diabetes mellitus without complications: Secondary | ICD-10-CM

## 2017-07-31 DIAGNOSIS — M109 Gout, unspecified: Secondary | ICD-10-CM

## 2017-07-31 NOTE — Telephone Encounter (Signed)
Copied from CRM [phone]. Topic: Quick Communication - Rx Refill/Question >> Jul 31, 2017  4:12 PM Cipriano BunkerLambe, Annette S wrote:   Medication: allopurinol (ZYLOPRIM) 300 MG tablet glipiZIDE (GLUCOTROL) 5 MG tablet  Has the patient contacted their pharmacy? No. (Agent: If no, request that the patient contact the pharmacy  for the refill.) Preferred Pharmacy (with phone number or street name):  [facility] [place] - [address] [place] [id number] Phone: [phone] Fax: [phone]   Agent: Please be advised that RX refills may take up to 3 business days. We ask that you follow-up with your pharmacy.

## 2017-08-01 ENCOUNTER — Ambulatory Visit: Payer: Medicare PPO

## 2017-08-01 MED ORDER — GLIPIZIDE 5 MG PO TABS: 5.0000 mg | ORAL_TABLET | Freq: Every day | ORAL | 0 refills | Status: DC

## 2017-08-01 MED ORDER — ROSUVASTATIN CALCIUM 40 MG PO TABS
40.0000 mg | ORAL_TABLET | Freq: Every day | ORAL | 0 refills | Status: DC
Start: 2017-08-01 — End: 2017-09-26

## 2017-08-01 MED ORDER — ALLOPURINOL 300 MG PO TABS
300.0000 mg | ORAL_TABLET | Freq: Every day | ORAL | 0 refills | Status: DC
Start: 2017-08-01 — End: 2017-09-26

## 2017-08-01 NOTE — Telephone Encounter (Signed)
Called pt RE: refill request. Pt states "Already taken care of by office."

## 2017-08-01 NOTE — Telephone Encounter (Signed)
Spoke with pt. earlier today.  Stated he wanted to have the Allopurinol, Glipizide, and Rosuvastatin Rx's sent to [facility].  Stated he has a few tablets left, and declined to get a partial Rx [facility] at [facility].  Per pt. request, [facility] and cancelled the above refills from order on 07/24/17.  Will transfer [facility] to [facility].  Pt. was informed.

## 2017-08-07 ENCOUNTER — Encounter: Payer: Medicare PPO | Admitting: Family Medicine

## 2017-09-26 ENCOUNTER — Ambulatory Visit (INDEPENDENT_AMBULATORY_CARE_PROVIDER_SITE_OTHER): Payer: Medicare HMO | Admitting: Family Medicine

## 2017-09-26 ENCOUNTER — Encounter: Payer: Self-pay | Admitting: Family Medicine

## 2017-09-26 VITALS — BP 151/64 | HR 63 | Ht 66.0 in | Wt 166.0 lb

## 2017-09-26 DIAGNOSIS — E7800 Pure hypercholesterolemia, unspecified: Secondary | ICD-10-CM | POA: Diagnosis not present

## 2017-09-26 DIAGNOSIS — Z1329 Encounter for screening for other suspected endocrine disorder: Secondary | ICD-10-CM

## 2017-09-26 DIAGNOSIS — Z7189 Other specified counseling: Secondary | ICD-10-CM

## 2017-09-26 DIAGNOSIS — Z125 Encounter for screening for malignant neoplasm of prostate: Secondary | ICD-10-CM

## 2017-09-26 DIAGNOSIS — E119 Type 2 diabetes mellitus without complications: Secondary | ICD-10-CM

## 2017-09-26 DIAGNOSIS — E118 Type 2 diabetes mellitus with unspecified complications: Secondary | ICD-10-CM

## 2017-09-26 DIAGNOSIS — I2583 Coronary atherosclerosis due to lipid rich plaque: Secondary | ICD-10-CM | POA: Diagnosis not present

## 2017-09-26 DIAGNOSIS — M109 Gout, unspecified: Secondary | ICD-10-CM | POA: Diagnosis not present

## 2017-09-26 DIAGNOSIS — I10 Essential (primary) hypertension: Secondary | ICD-10-CM | POA: Diagnosis not present

## 2017-09-26 DIAGNOSIS — I2510 Atherosclerotic heart disease of native coronary artery without angina pectoris: Secondary | ICD-10-CM

## 2017-09-26 DIAGNOSIS — M1712 Unilateral primary osteoarthritis, left knee: Secondary | ICD-10-CM | POA: Diagnosis not present

## 2017-09-26 DIAGNOSIS — S78111D Complete traumatic amputation at level between right hip and knee, subsequent encounter: Secondary | ICD-10-CM | POA: Diagnosis not present

## 2017-09-26 LAB — URINALYSIS, ROUTINE W REFLEX MICROSCOPIC
Bilirubin, UA: NEGATIVE
Ketones, UA: NEGATIVE
Leukocytes, UA: NEGATIVE
Nitrite, UA: NEGATIVE
Specific Gravity, UA: 1.015 (ref 1.005–1.030)
Urobilinogen, Ur: 2 mg/dL — ABNORMAL HIGH (ref 0.2–1.0)
pH, UA: 6 (ref 5.0–7.5)

## 2017-09-26 LAB — MICROSCOPIC EXAMINATION: Bacteria, UA: NONE SEEN

## 2017-09-26 LAB — BAYER DCA HB A1C WAIVED: HB A1C (BAYER DCA - WAIVED): 7 % — ABNORMAL HIGH (ref <7.0)

## 2017-09-26 MED ORDER — ALLOPURINOL 300 MG PO TABS: 300.0000 mg | ORAL_TABLET | Freq: Every day | ORAL | 4 refills | Status: DC

## 2017-09-26 MED ORDER — LOSARTAN POTASSIUM 100 MG PO TABS: 100.0000 mg | ORAL_TABLET | Freq: Every day | ORAL | 4 refills | Status: DC

## 2017-09-26 MED ORDER — GLIPIZIDE 5 MG PO TABS: 5.0000 mg | ORAL_TABLET | Freq: Every day | ORAL | 4 refills | Status: DC

## 2017-09-26 MED ORDER — AMLODIPINE BESYLATE 10 MG PO TABS: 10.0000 mg | ORAL_TABLET | Freq: Every day | ORAL | 4 refills | Status: DC

## 2017-09-26 MED ORDER — ROSUVASTATIN CALCIUM 40 MG PO TABS: 40.0000 mg | ORAL_TABLET | Freq: Every day | ORAL | 4 refills | Status: DC

## 2017-09-26 MED ORDER — METFORMIN HCL 500 MG PO TABS: 1000.0000 mg | ORAL_TABLET | Freq: Two times a day (BID) | ORAL | 4 refills | Status: DC

## 2017-09-26 NOTE — Assessment & Plan Note (Signed)
The current medical regimen is effective;  continue present plan and medications.  

## 2017-09-26 NOTE — Assessment & Plan Note (Signed)
A voluntary discussion about advanced care planning including explanation and discussion of advanced directives was extentively discussed with the patient.  Explained about the healthcare proxy and living will was reviewed and packet with forms with expiration of how to fill them out was given.  Time spent: Encounter 16+ min individuals present: Patient 

## 2017-09-26 NOTE — Assessment & Plan Note (Signed)
Discuss degenerative arthritis left knee will refer to orthopedics

## 2017-09-26 NOTE — Assessment & Plan Note (Signed)
Leg amputation site is stable but has compensatory problems with his left leg.

## 2017-09-26 NOTE — Progress Notes (Signed)
BP (!) 151/64   Pulse 63   Ht 5\' 6"  (1.676 m)   Wt 166 lb (75.3 kg)   SpO2 97%   BMI 26.79 kg/m    Subjective:    Patient ID: Carl Bryan, male    DOB: Oct 13, 1945, 72 y.o.   MRN: [id number]  HPI: Carl Bryan is a 72 y.o. male  Chief Complaint  Patient presents with  . Annual Exam  Patient's primary concern today is left knee will lock click give way.  Been getting more and more sore no swelling or irritation but just hurts a lot.  Patient of course has just one leg and overuses his left leg. Diabetes doing good no low blood sugar spells or irritations. No chest pain chest tightness or problems. Cholesterol gout also doing well.  Relevant past medical, surgical, family and social history reviewed and updated as indicated. Interim medical history since our last visit reviewed. Allergies and medications reviewed and updated.  Review of Systems  Constitutional: Negative.   HENT: Negative.   Eyes: Negative.   Respiratory: Negative.   Cardiovascular: Negative.   Gastrointestinal: Negative.   Endocrine: Negative.   Genitourinary: Negative.   Musculoskeletal: Negative.   Skin: Negative.   Allergic/Immunologic: Negative.   Neurological: Negative.   Hematological: Negative.   Psychiatric/Behavioral: Negative.     Per HPI unless specifically indicated above     Objective:    BP (!) 151/64   Pulse 63   Ht 5\' 6"  (1.676 m)   Wt 166 lb (75.3 kg)   SpO2 97%   BMI 26.79 kg/m   Wt Readings from Last 3 Encounters:  09/26/17 166 lb (75.3 kg)  07/30/17 166 lb 4.8 oz (75.4 kg)  04/24/17 167 lb (75.8 kg)    Physical Exam  Constitutional: He is oriented to person, place, and time. He appears well-developed and well-nourished.  HENT:  Head: Normocephalic and atraumatic.  Right Ear: External ear normal.  Left Ear: External ear normal.  Eyes: Pupils are equal, round, and reactive to light. Conjunctivae and EOM are normal.  Neck: Normal range of motion. Neck supple.    Cardiovascular: Normal rate, regular rhythm, normal heart sounds and intact distal pulses.  Pulmonary/Chest: Effort normal and breath sounds normal.  Abdominal: Soft. Bowel sounds are normal. There is no splenomegaly or hepatomegaly.  Genitourinary: Rectum normal, prostate normal and penis normal.  Musculoskeletal: Normal range of motion.  Neurological: He is alert and oriented to person, place, and time. He has normal reflexes.  Skin: No rash noted. No erythema.  Psychiatric: He has a normal mood and affect. His behavior is normal. Judgment and thought content normal.    Results for orders placed or performed in visit on 04/24/17  Basic metabolic panel  Result Value Ref Range   Glucose 122 (H) 65 - 99 mg/dL   BUN 13 8 - 27 mg/dL   Creatinine, Ser 4.09 0.76 - 1.27 mg/dL   GFR calc non Af Amer 85 >59 mL/min/1.73   GFR calc Af Amer 98 >59 mL/min/1.73   BUN/Creatinine Ratio 15 10 - 24   Sodium 141 134 - 144 mmol/L   Potassium 4.6 3.5 - 5.2 mmol/L   Chloride 105 96 - 106 mmol/L   CO2 17 (L) 20 - 29 mmol/L   Calcium 9.6 8.6 - 10.2 mg/dL  Bayer DCA Hb W1X Waived  Result Value Ref Range   HB A1C (BAYER DCA - WAIVED) 6.7 <7.0 %  LP+ALT+AST Piccolo, [address]  Result Value Ref Range   ALT (SGPT) Piccolo, Waived 38 10 - 47 U/L   AST (SGOT) Piccolo, Waived 35 11 - 38 U/L   Cholesterol Piccolo, Waived 115 <200 mg/dL   HDL Chol Piccolo, Waived 35 (L) >59 mg/dL   Triglycerides Piccolo,Waived 181 (H) <150 mg/dL   Chol/HDL Ratio Piccolo,Waive 3.3 mg/dL   LDL Chol Calc Piccolo Waived 44 <100 mg/dL   VLDL Chol Calc Piccolo,Waive 36 (H) <30 mg/dL      Assessment & Plan:   Problem List Items Addressed This Visit      Cardiovascular and Mediastinum   Essential (primary) hypertension - Primary    The current medical regimen is effective;  continue present plan and medications.       Relevant Medications   rosuvastatin (CRESTOR) 40 MG tablet   losartan (COZAAR) 100 MG tablet   amLODipine  (NORVASC) 10 MG tablet   Other Relevant Orders   CBC with Differential/Platelet   Comprehensive metabolic panel   Lipid panel   Urinalysis, Routine w reflex microscopic   Bayer DCA Hb A1c Waived   CAD (coronary artery disease)    The current medical regimen is effective;  continue present plan and medications.       Relevant Medications   rosuvastatin (CRESTOR) 40 MG tablet   losartan (COZAAR) 100 MG tablet   amLODipine (NORVASC) 10 MG tablet     Endocrine   Type 2 diabetes mellitus (HCC)    The current medical regimen is effective;  continue present plan and medications.       Relevant Medications   rosuvastatin (CRESTOR) 40 MG tablet   metFORMIN (GLUCOPHAGE) 500 MG tablet   losartan (COZAAR) 100 MG tablet   glipiZIDE (GLUCOTROL) 5 MG tablet   Other Relevant Orders   CBC with Differential/Platelet   Comprehensive metabolic panel   Lipid panel   Urinalysis, Routine w reflex microscopic   Bayer DCA Hb A1c Waived     Musculoskeletal and Integument   Arthritis of knee, left    Discuss degenerative arthritis left knee will refer to orthopedics      Relevant Medications   allopurinol (ZYLOPRIM) 300 MG tablet   Other Relevant Orders   Ambulatory referral to Orthopedic Surgery     Other   Pure hypercholesterolemia    The current medical regimen is effective;  continue present plan and medications.       Relevant Medications   rosuvastatin (CRESTOR) 40 MG tablet   losartan (COZAAR) 100 MG tablet   amLODipine (NORVASC) 10 MG tablet   Other Relevant Orders   CBC with Differential/Platelet   Comprehensive metabolic panel   Lipid panel   Urinalysis, Routine w reflex microscopic   Bayer DCA Hb A1c Waived   Traumatic amputation of leg above knee (HCC)    Leg amputation site is stable but has compensatory problems with his left leg.      Gout    The current medical regimen is effective;  continue present plan and medications.       Relevant Medications    allopurinol (ZYLOPRIM) 300 MG tablet   Advance care planning    A voluntary discussion about advanced care planning including explanation and discussion of advanced directives was extentively discussed with the patient.  Explained about the healthcare proxy and living will was reviewed and packet with forms with expiration of how to fill them out was given.  Time spent: Encounter 16+ min individuals present: Patient  Other Visit Diagnoses    Prostate cancer screening       Relevant Orders   PSA   Thyroid disorder screen       Relevant Orders   TSH       Follow up plan: Return in about 6 months (around 03/28/2018) for Hemoglobin A1c, BMP,  Lipids, ALT, AST.

## 2017-09-27 LAB — COMPREHENSIVE METABOLIC PANEL
ALT: 29 IU/L (ref 0–44)
AST: 14 IU/L (ref 0–40)
Albumin/Globulin Ratio: 2.3 — ABNORMAL HIGH (ref 1.2–2.2)
Albumin: 4.8 g/dL (ref 3.5–4.8)
Alkaline Phosphatase: 68 IU/L (ref 39–117)
BUN/Creatinine Ratio: 14 (ref 10–24)
BUN: 13 mg/dL (ref 8–27)
Bilirubin Total: 0.7 mg/dL (ref 0.0–1.2)
CO2: 18 mmol/L — ABNORMAL LOW (ref 20–29)
Calcium: 9.9 mg/dL (ref 8.6–10.2)
Chloride: 105 mmol/L (ref 96–106)
Creatinine, Ser: 0.93 mg/dL (ref 0.76–1.27)
GFR calc Af Amer: 94 mL/min/{1.73_m2} (ref >59)
GFR calc non Af Amer: 81 mL/min/{1.73_m2} (ref >59)
Globulin, Total: 2.1 g/dL (ref 1.5–4.5)
Glucose: 173 mg/dL — ABNORMAL HIGH (ref 65–99)
Potassium: 4.5 mmol/L (ref 3.5–5.2)
Sodium: 138 mmol/L (ref 134–144)
Total Protein: 6.9 g/dL (ref 6.0–8.5)

## 2017-09-27 LAB — CBC WITH DIFFERENTIAL/PLATELET
Basophils Absolute: 0.1 10*3/uL (ref 0.0–0.2)
Basos: 1 %
EOS (ABSOLUTE): 0.2 10*3/uL (ref 0.0–0.4)
Eos: 2 %
Hematocrit: 43.6 % (ref 37.5–51.0)
Hemoglobin: 15.4 g/dL (ref 13.0–17.7)
Immature Grans (Abs): 0 10*3/uL (ref 0.0–0.1)
Immature Granulocytes: 0 %
Lymphocytes Absolute: 1.9 10*3/uL (ref 0.7–3.1)
Lymphs: 19 %
MCH: 32.7 pg (ref 26.6–33.0)
MCHC: 35.3 g/dL (ref 31.5–35.7)
MCV: 93 fL (ref 79–97)
Monocytes Absolute: 0.6 10*3/uL (ref 0.1–0.9)
Monocytes: 6 %
Neutrophils Absolute: 7.2 10*3/uL — ABNORMAL HIGH (ref 1.4–7.0)
Neutrophils: 72 %
Platelets: 255 10*3/uL (ref 150–450)
RBC: 4.71 x10E6/uL (ref 4.14–5.80)
RDW: 12.8 % (ref 12.3–15.4)
WBC: 9.9 10*3/uL (ref 3.4–10.8)

## 2017-09-27 LAB — LIPID PANEL
Chol/HDL Ratio: 3.3 ratio (ref 0.0–5.0)
Cholesterol, Total: 117 mg/dL (ref 100–199)
HDL: 35 mg/dL — ABNORMAL LOW (ref >39)
LDL Calculated: 45 mg/dL (ref 0–99)
Triglycerides: 184 mg/dL — ABNORMAL HIGH (ref 0–149)
VLDL Cholesterol Cal: 37 mg/dL (ref 5–40)

## 2017-09-27 LAB — TSH: TSH: 1.97 u[IU]/mL (ref 0.450–4.500)

## 2017-09-27 LAB — PSA: Prostate Specific Ag, Serum: 1.4 ng/mL (ref 0.0–4.0)

## 2017-10-02 DIAGNOSIS — M1712 Unilateral primary osteoarthritis, left knee: Secondary | ICD-10-CM | POA: Diagnosis not present

## 2017-10-15 ENCOUNTER — Telehealth: Payer: Self-pay | Admitting: Family Medicine

## 2017-10-15 NOTE — Telephone Encounter (Signed)
Copied from CRM [id number]. Topic: Quick Communication - See Telephone Encounter >> Oct 15, 2017 11:23 AM Raquel SarnaHayes, Teresa G wrote: Pt's wife - Kym GroomJanice Dollinger  [phone]- [phone]- Needing to know if pt had melanoma and how long ago. Pt's wife is needing to know this for insurance purposes.

## 2017-10-15 NOTE — Telephone Encounter (Signed)
Please advised  

## 2017-10-15 NOTE — Telephone Encounter (Signed)
On chart review current and old chart no diagnosis of melanoma

## 2017-10-15 NOTE — Telephone Encounter (Signed)
Left message on machine for pt to return call to the office.  

## 2017-10-15 NOTE — Telephone Encounter (Signed)
Per Chart search no melanoma dx noted. Please verify.

## 2017-11-28 ENCOUNTER — Other Ambulatory Visit: Payer: Self-pay | Admitting: Family Medicine

## 2017-11-28 NOTE — Telephone Encounter (Signed)
Patient is requesting new glucose monitor devise- Accu-chek Aviva Plus   Historical provider  LOV: 09/26/17  PCP: Dossie Arbourrissman  Pharmacy: verified

## 2017-11-29 ENCOUNTER — Encounter: Payer: Self-pay | Admitting: Family Medicine

## 2017-11-29 ENCOUNTER — Ambulatory Visit (INDEPENDENT_AMBULATORY_CARE_PROVIDER_SITE_OTHER): Payer: Medicare HMO | Admitting: Family Medicine

## 2017-11-29 VITALS — BP 134/65 | HR 64 | Temp 97.8°F | Wt 166.0 lb

## 2017-11-29 DIAGNOSIS — H6593 Unspecified nonsuppurative otitis media, bilateral: Secondary | ICD-10-CM | POA: Diagnosis not present

## 2017-11-29 MED ORDER — CETIRIZINE HCL 10 MG PO TABS: 10.0000 mg | ORAL_TABLET | Freq: Every day | ORAL | 1 refills | Status: DC

## 2017-11-29 MED ORDER — FLUTICASONE PROPIONATE 50 MCG/ACT NA SUSP: 2.0000 | Freq: Two times a day (BID) | NASAL | 2 refills | Status: DC

## 2017-11-29 NOTE — Progress Notes (Signed)
BP 134/65 (BP Location: Right Arm, Patient Position: Sitting, Cuff Size: Normal)   Pulse 64   Temp 97.8 F (36.6 C) (Oral)   Wt 166 lb (75.3 kg)   SpO2 94%   BMI 26.79 kg/m    Subjective:    Patient ID: Carl Bryan, male    DOB: 12/20/1945, 72 y.o.   MRN: [id number]  HPI: Carl Stalled R [place] is a 72 y.o. male  Chief Complaint  Patient presents with  . Ears Stopped Up    Bilateral ongoing for 2 weeks   Here today for b/l ear fullness x 2 weeks. Muffled hearing, but no pain, fevers, congestion, sore throat, tinnitus. Has not been trying anything for relief. No sick contacts. Known hx of allergic rhinitis.   Relevant past medical, surgical, family and social history reviewed and updated as indicated. Interim medical history since our last visit reviewed. Allergies and medications reviewed and updated.  Review of Systems  Per HPI unless specifically indicated above     Objective:    BP 134/65 (BP Location: Right Arm, Patient Position: Sitting, Cuff Size: Normal)   Pulse 64   Temp 97.8 F (36.6 C) (Oral)   Wt 166 lb (75.3 kg)   SpO2 94%   BMI 26.79 kg/m   Wt Readings from Last 3 Encounters:  11/29/17 166 lb (75.3 kg)  09/26/17 166 lb (75.3 kg)  07/30/17 166 lb 4.8 oz (75.4 kg)    Physical Exam  Constitutional: He is oriented to person, place, and time. He appears well-developed and well-nourished. No distress.  HENT:  Head: Atraumatic.  Right Ear: External ear normal.  Left Ear: External ear normal.  Mouth/Throat: Oropharynx is clear and moist.  Nasal mucosa erythematous B/l middle ear effusion, no evidence of bacterial infection  Eyes: Conjunctivae and EOM are normal.  Neck: Normal range of motion. Neck supple.  Cardiovascular: Normal rate and regular rhythm.  Pulmonary/Chest: Effort normal and breath sounds normal.  Musculoskeletal:  ROM at baseline with use of crutches d/t LE amputation  Neurological: He is alert and oriented to person, place, and time.  Skin: Skin  is warm and dry.  Psychiatric: He has a normal mood and affect. His behavior is normal.  Nursing note and vitals reviewed.   Results for orders placed or performed in visit on 09/26/17  Microscopic Examination  Result Value Ref Range   WBC, UA 0-5 0 - 5 /hpf   RBC, UA 0-2 0 - 2 /hpf   Epithelial Cells (non renal) CANCELED    Bacteria, UA None seen None seen/Few  CBC with Differential/Platelet  Result Value Ref Range   WBC 9.9 3.4 - 10.8 x10E3/uL   RBC 4.71 4.14 - 5.80 x10E6/uL   Hemoglobin 15.4 13.0 - 17.7 g/dL   Hematocrit 62.143.6 30.837.5 - 51.0 %   MCV 93 79 - 97 fL   MCH 32.7 26.6 - 33.0 pg   MCHC 35.3 31.5 - 35.7 g/dL   RDW 65.712.8 84.612.3 - 96.215.4 %   Platelets 255 150 - 450 x10E3/uL   Neutrophils 72 Not Estab. %   Lymphs 19 Not Estab. %   Monocytes 6 Not Estab. %   Eos 2 Not Estab. %   Basos 1 Not Estab. %   Neutrophils Absolute 7.2 (H) 1.4 - 7.0 x10E3/uL   Lymphocytes Absolute 1.9 0.7 - 3.1 x10E3/uL   Monocytes Absolute 0.6 0.1 - 0.9 x10E3/uL   EOS (ABSOLUTE) 0.2 0.0 - 0.4 x10E3/uL   Basophils Absolute 0.1  0.0 - 0.2 x10E3/uL   Immature Granulocytes 0 Not Estab. %   Immature Grans (Abs) 0.0 0.0 - 0.1 x10E3/uL  Comprehensive metabolic panel  Result Value Ref Range   Glucose 173 (H) 65 - 99 mg/dL   BUN 13 8 - 27 mg/dL   Creatinine, Ser 1.30 0.76 - 1.27 mg/dL   GFR calc non Af Amer 81 >59 mL/min/1.73   GFR calc Af Amer 94 >59 mL/min/1.73   BUN/Creatinine Ratio 14 10 - 24   Sodium 138 134 - 144 mmol/L   Potassium 4.5 3.5 - 5.2 mmol/L   Chloride 105 96 - 106 mmol/L   CO2 18 (L) 20 - 29 mmol/L   Calcium 9.9 8.6 - 10.2 mg/dL   Total Protein 6.9 6.0 - 8.5 g/dL   Albumin 4.8 3.5 - 4.8 g/dL   Globulin, Total 2.1 1.5 - 4.5 g/dL   Albumin/Globulin Ratio 2.3 (H) 1.2 - 2.2   Bilirubin Total 0.7 0.0 - 1.2 mg/dL   Alkaline Phosphatase 68 39 - 117 IU/L   AST 14 0 - 40 IU/L   ALT 29 0 - 44 IU/L  Lipid panel  Result Value Ref Range   Cholesterol, Total 117 100 - 199 mg/dL    Triglycerides 865 (H) 0 - 149 mg/dL   HDL 35 (L) >78 mg/dL   VLDL Cholesterol Cal 37 5 - 40 mg/dL   LDL Calculated 45 0 - 99 mg/dL   Chol/HDL Ratio 3.3 0.0 - 5.0 ratio  PSA  Result Value Ref Range   Prostate Specific Ag, Serum 1.4 0.0 - 4.0 ng/mL  TSH  Result Value Ref Range   TSH 1.970 0.450 - 4.500 uIU/mL  Urinalysis, Routine w reflex microscopic  Result Value Ref Range   Specific Gravity, UA 1.015 1.005 - 1.030   pH, UA 6.0 5.0 - 7.5   Color, UA Yellow Yellow   Appearance Ur Clear Clear   Leukocytes, UA Negative Negative   Protein, UA 2+ (A) Negative/Trace   Glucose, UA Trace (A) Negative   Ketones, UA Negative Negative   RBC, UA 1+ (A) Negative   Bilirubin, UA Negative Negative   Urobilinogen, Ur 2.0 (H) 0.2 - 1.0 mg/dL   Nitrite, UA Negative Negative   Microscopic Examination See below:   Bayer DCA Hb A1c Waived  Result Value Ref Range   HB A1C (BAYER DCA - WAIVED) 7.0 (H) <7.0 %      Assessment & Plan:   Problem List Items Addressed This Visit    None    Visit Diagnoses    Otitis media with effusion, bilateral    -  Primary   tx with zyrtec and flonase. Will avoid sudafed and prednisone if possible given hx of DM and HTN. Return precautions reviewed       Follow up plan: Return for as scheduled.

## 2017-12-02 NOTE — Patient Instructions (Signed)
Follow up as scheduled.  

## 2018-01-14 ENCOUNTER — Ambulatory Visit (INDEPENDENT_AMBULATORY_CARE_PROVIDER_SITE_OTHER): Payer: Medicare HMO

## 2018-01-14 DIAGNOSIS — Z23 Encounter for immunization: Secondary | ICD-10-CM | POA: Diagnosis not present

## 2018-02-13 ENCOUNTER — Ambulatory Visit (INDEPENDENT_AMBULATORY_CARE_PROVIDER_SITE_OTHER): Payer: Medicare HMO | Admitting: Family Medicine

## 2018-02-13 ENCOUNTER — Encounter: Payer: Self-pay | Admitting: Family Medicine

## 2018-02-13 VITALS — BP 164/79 | HR 60 | Temp 97.9°F | Wt 169.0 lb

## 2018-02-13 DIAGNOSIS — G4452 New daily persistent headache (NDPH): Secondary | ICD-10-CM

## 2018-02-13 MED ORDER — DIAZEPAM 5 MG PO TABS: 5.0000 mg | ORAL_TABLET | Freq: Two times a day (BID) | ORAL | 1 refills | Status: DC | PRN

## 2018-02-13 NOTE — Progress Notes (Signed)
   BP (!) 164/79   Pulse 60   Temp 97.9 F (36.6 C) (Oral)   Wt 169 lb (76.7 kg)   SpO2 98%   BMI 27.28 kg/m    Subjective:    Patient ID: Carl Bryan, male    DOB: 1945/06/18, 72 y.o.   MRN: [id number]  HPI: Carl Bryan is a 72 y.o. male  Chief Complaint  Patient presents with  . Headache    x 3-4 days. Forehead. Sharp pain. Constanst    Here today for a new severe persistent headache. Worse in the mornings, right between eyes in mid forehead. Sharp, constant pain. Only change is he fell twice fishing last week but did not hit his head or strain his neck. Feels weak overall, but otherwise no confusion, visual disturbances, paresthesias. Taking tylenol with no relief whatsoever. No hx of migraines or other headaches. States he hasn't had one in years.  No fhx of aneurysms or other brain abnormalities.   Relevant past medical, surgical, family and social history reviewed and updated as indicated. Interim medical history since our last visit reviewed. Allergies and medications reviewed and updated.  Review of Systems  Per HPI unless specifically indicated above     Objective:    BP (!) 164/79   Pulse 60   Temp 97.9 F (36.6 C) (Oral)   Wt 169 lb (76.7 kg)   SpO2 98%   BMI 27.28 kg/m   Wt Readings from Last 3 Encounters:  02/13/18 169 lb (76.7 kg)  11/29/17 166 lb (75.3 kg)  09/26/17 166 lb (75.3 kg)    Physical Exam  Constitutional: He is oriented to person, place, and time. He appears well-developed and well-nourished.  HENT:  Head: Atraumatic.  Eyes: Conjunctivae and EOM are normal.  Neck: Normal range of motion. Neck supple.  Cardiovascular: Normal rate.  Pulmonary/Chest: Effort normal and breath sounds normal.  Musculoskeletal:  Gait at baseline, s/p right UE/LE amputation Strength full and intact left UE/LE  Neurological: He is alert and oriented to person, place, and time. No cranial nerve deficit or sensory deficit. He exhibits normal muscle tone.  Coordination normal.  Skin: Skin is warm and dry.  Psychiatric: He has a normal mood and affect. His behavior is normal.  Vitals reviewed.   Results for orders placed or performed in visit on 12/03/17  HM DIABETES EYE EXAM  Result Value Ref Range   HM Diabetic Eye Exam No Retinopathy No Retinopathy      Assessment & Plan:   Problem List Items Addressed This Visit    None    Visit Diagnoses    New daily persistent headache    -  Primary   Will obtain stat MRI w contrast given acute onset without triggering event. Pt advised at length to go to ER in meantime with worsening sxs, agreeable to plan   Relevant Medications   diazepam (VALIUM) 5 MG tablet   Other Relevant Orders   MR Brain W Wo Contrast   Basic Metabolic Panel (BMET)    States he's incredibly anxious during enclosed testing, will send valium for him to take during testing. Sedation precautions reviewed at length. Will recheck BMP, last creatinine WNL 09/2017.    Follow up plan: Return if symptoms worsen or fail to improve.

## 2018-02-13 NOTE — Patient Instructions (Signed)
Follow up as needed

## 2018-02-14 ENCOUNTER — Ambulatory Visit
Admission: RE | Admit: 2018-02-14 | Discharge: 2018-02-14 | Disposition: A | Discharge status: Home / Self Care | Payer: Medicare HMO | Source: Ambulatory Visit | Attending: Family Medicine | Admitting: Family Medicine

## 2018-02-14 DIAGNOSIS — G4452 New daily persistent headache (NDPH): Secondary | ICD-10-CM | POA: Diagnosis not present

## 2018-02-14 DIAGNOSIS — R51 Headache: Secondary | ICD-10-CM | POA: Diagnosis not present

## 2018-02-14 LAB — BASIC METABOLIC PANEL
BUN/Creatinine Ratio: 19 (ref 10–24)
BUN: 18 mg/dL (ref 8–27)
CO2: 18 mmol/L — ABNORMAL LOW (ref 20–29)
Calcium: 10.1 mg/dL (ref 8.6–10.2)
Chloride: 103 mmol/L (ref 96–106)
Creatinine, Ser: 0.97 mg/dL (ref 0.76–1.27)
GFR calc Af Amer: 89 mL/min/{1.73_m2} (ref >59)
GFR calc non Af Amer: 77 mL/min/{1.73_m2} (ref >59)
Glucose: 199 mg/dL — ABNORMAL HIGH (ref 65–99)
Potassium: 4.2 mmol/L (ref 3.5–5.2)
Sodium: 139 mmol/L (ref 134–144)

## 2018-02-14 MED ORDER — GADOBUTROL 1 MMOL/ML IV SOLN
7.5000 mL | Freq: Once | INTRAVENOUS | Status: AC | PRN
  Administered 2018-02-14: 7.5 mL via INTRAVENOUS

## 2018-03-28 ENCOUNTER — Encounter: Payer: Self-pay | Admitting: Family Medicine

## 2018-03-28 ENCOUNTER — Ambulatory Visit (INDEPENDENT_AMBULATORY_CARE_PROVIDER_SITE_OTHER): Payer: Medicare HMO | Admitting: Family Medicine

## 2018-03-28 VITALS — BP 154/70 | HR 82 | Temp 97.8°F | Ht 66.0 in | Wt 167.2 lb

## 2018-03-28 DIAGNOSIS — M1712 Unilateral primary osteoarthritis, left knee: Secondary | ICD-10-CM | POA: Diagnosis not present

## 2018-03-28 DIAGNOSIS — I10 Essential (primary) hypertension: Secondary | ICD-10-CM

## 2018-03-28 DIAGNOSIS — E7800 Pure hypercholesterolemia, unspecified: Secondary | ICD-10-CM

## 2018-03-28 DIAGNOSIS — H9191 Unspecified hearing loss, right ear: Secondary | ICD-10-CM | POA: Insufficient documentation

## 2018-03-28 DIAGNOSIS — L989 Disorder of the skin and subcutaneous tissue, unspecified: Secondary | ICD-10-CM

## 2018-03-28 DIAGNOSIS — E1169 Type 2 diabetes mellitus with other specified complication: Secondary | ICD-10-CM | POA: Diagnosis not present

## 2018-03-28 DIAGNOSIS — E118 Type 2 diabetes mellitus with unspecified complications: Secondary | ICD-10-CM

## 2018-03-28 LAB — LP+ALT+AST PICCOLO, WAIVED
ALT (SGPT) Piccolo, Waived: 35 U/L (ref 10–47)
AST (SGOT) Piccolo, Waived: 28 U/L (ref 11–38)
Chol/HDL Ratio Piccolo,Waive: 3.5 mg/dL
Cholesterol Piccolo, Waived: 122 mg/dL (ref <200)
HDL Chol Piccolo, Waived: 35 mg/dL — ABNORMAL LOW (ref >59)
LDL Chol Calc Piccolo Waived: 41 mg/dL (ref <100)
Triglycerides Piccolo,Waived: 232 mg/dL — ABNORMAL HIGH (ref <150)
VLDL Chol Calc Piccolo,Waive: 46 mg/dL — ABNORMAL HIGH (ref <30)

## 2018-03-28 LAB — BAYER DCA HB A1C WAIVED: HB A1C (BAYER DCA - WAIVED): 7.2 % — ABNORMAL HIGH (ref <7.0)

## 2018-03-28 NOTE — Assessment & Plan Note (Signed)
Nonhealing skin lesions arms will refer to dermatology.

## 2018-03-28 NOTE — Assessment & Plan Note (Signed)
The current medical regimen is effective;  continue present plan and medications.  

## 2018-03-28 NOTE — Assessment & Plan Note (Addendum)
Ongoing deafness right ear will refer to ENT to further evaluate. First noticed deafness back this summer

## 2018-03-28 NOTE — Progress Notes (Signed)
BP (!) 154/70 (BP Location: Left Arm, Patient Position: Sitting, Cuff Size: Large)   Pulse 82   Temp 97.8 F (36.6 C)   Ht 5\' 6"  (1.676 m)   Wt 167 lb 4 oz (75.9 kg)   SpO2 94%   BMI 26.99 kg/m    Subjective:    Patient ID: Carl Stalled R Lenzen, male    DOB: 04/19/1945, 72 y.o.   MRN: [id number]  HPI: Carl Bryan is a 72 y.o. male  Chief Complaint  Patient presents with  . Diabetes  . Hyperlipidemia  . Hypertension  Patient's primary complaint is right ear deafness.  This is really been ongoing all fall.  Patient is here is usually stopped up with wax. Patient also with nonhealing lesions on both arms which itch and burns been on for 6 months or more. Diabetes no issues with medications  blood pressure doing well. Also cholesterol doing well without problems.  Relevant past medical, surgical, family and social history reviewed and updated as indicated. Interim medical history since our last visit reviewed. Allergies and medications reviewed and updated.  Review of Systems  Constitutional: Negative.   Respiratory: Negative.   Cardiovascular: Negative.     Per HPI unless specifically indicated above     Objective:    BP (!) 154/70 (BP Location: Left Arm, Patient Position: Sitting, Cuff Size: Large)   Pulse 82   Temp 97.8 F (36.6 C)   Ht 5\' 6"  (1.676 m)   Wt 167 lb 4 oz (75.9 kg)   SpO2 94%   BMI 26.99 kg/m   Wt Readings from Last 3 Encounters:  03/28/18 167 lb 4 oz (75.9 kg)  02/13/18 169 lb (76.7 kg)  11/29/17 166 lb (75.3 kg)    Physical Exam Constitutional:      Appearance: He is well-developed.  HENT:     Head: Normocephalic and atraumatic.     Right Ear: Tympanic membrane, ear canal and external ear normal.     Left Ear: Tympanic membrane, ear canal and external ear normal.  Eyes:     Conjunctiva/sclera: Conjunctivae normal.  Neck:     Musculoskeletal: Normal range of motion.  Cardiovascular:     Rate and Rhythm: Normal rate and regular rhythm.   Heart sounds: Normal heart sounds.  Pulmonary:     Effort: Pulmonary effort is normal.     Breath sounds: Normal breath sounds.  Musculoskeletal: Normal range of motion.  Skin:    Findings: No erythema.     Comments: Arms with nonhealing skin lesions consistent with squamous cell carcinoma  Neurological:     Mental Status: He is alert and oriented to person, place, and time.  Psychiatric:        Behavior: Behavior normal.        Thought Content: Thought content normal.        Judgment: Judgment normal.     Results for orders placed or performed in visit on 02/13/18  Basic Metabolic Panel (BMET)  Result Value Ref Range   Glucose 199 (H) 65 - 99 mg/dL   BUN 18 8 - 27 mg/dL   Creatinine, Ser 7.320.97 0.76 - 1.27 mg/dL   GFR calc non Af Amer 77 >59 mL/min/1.73   GFR calc Af Amer 89 >59 mL/min/1.73   BUN/Creatinine Ratio 19 10 - 24   Sodium 139 134 - 144 mmol/L   Potassium 4.2 3.5 - 5.2 mmol/L   Chloride 103 96 - 106 mmol/L   CO2 18 (L)  20 - 29 mmol/L   Calcium 10.1 8.6 - 10.2 mg/dL      Assessment & Plan:   Problem List Items Addressed This Visit      Cardiovascular and Mediastinum   Essential (primary) hypertension - Primary    The current medical regimen is effective;  continue present plan and medications.       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         Nervous and Auditory   Deafness in right ear    Ongoing deafness right ear will refer to ENT to further evaluate. First noticed deafness back this summer      Relevant Orders   Ambulatory referral to ENT     Musculoskeletal and Integument   Skin lesions    Nonhealing skin lesions arms will refer to dermatology.      Relevant Orders   Ambulatory referral to Dermatology     Other   Pure hypercholesterolemia    The current medical regimen is  effective;  continue present plan and medications.       Relevant Orders   LP+ALT+AST [place], [place]    Other Visit Diagnoses    Type 2 diabetes mellitus with complication, without long-term current use of insulin (HCC)       Relevant Orders   Bayer DCA Hb A1c Waived       Follow up plan: Return in about 6 months (around 09/27/2018).

## 2018-03-29 LAB — BASIC METABOLIC PANEL
BUN/Creatinine Ratio: 16 (ref 10–24)
BUN: 17 mg/dL (ref 8–27)
CO2: 21 mmol/L (ref 20–29)
Calcium: 10.1 mg/dL (ref 8.6–10.2)
Chloride: 102 mmol/L (ref 96–106)
Creatinine, Ser: 1.07 mg/dL (ref 0.76–1.27)
GFR calc Af Amer: 79 mL/min/{1.73_m2} (ref >59)
GFR calc non Af Amer: 68 mL/min/{1.73_m2} (ref >59)
Glucose: 219 mg/dL — ABNORMAL HIGH (ref 65–99)
Potassium: 4 mmol/L (ref 3.5–5.2)
Sodium: 139 mmol/L (ref 134–144)

## 2018-04-23 DIAGNOSIS — E119 Type 2 diabetes mellitus without complications: Secondary | ICD-10-CM | POA: Diagnosis not present

## 2018-04-23 DIAGNOSIS — H9121 Sudden idiopathic hearing loss, right ear: Secondary | ICD-10-CM | POA: Diagnosis not present

## 2018-05-06 DIAGNOSIS — L578 Other skin changes due to chronic exposure to nonionizing radiation: Secondary | ICD-10-CM | POA: Diagnosis not present

## 2018-05-06 DIAGNOSIS — D692 Other nonthrombocytopenic purpura: Secondary | ICD-10-CM | POA: Diagnosis not present

## 2018-05-06 DIAGNOSIS — E119 Type 2 diabetes mellitus without complications: Secondary | ICD-10-CM | POA: Diagnosis not present

## 2018-05-06 DIAGNOSIS — L812 Freckles: Secondary | ICD-10-CM | POA: Diagnosis not present

## 2018-05-06 DIAGNOSIS — L821 Other seborrheic keratosis: Secondary | ICD-10-CM | POA: Diagnosis not present

## 2018-05-14 DIAGNOSIS — H9120 Sudden idiopathic hearing loss, unspecified ear: Secondary | ICD-10-CM | POA: Diagnosis not present

## 2018-05-14 DIAGNOSIS — H90A21 Sensorineural hearing loss, unilateral, right ear, with restricted hearing on the contralateral side: Secondary | ICD-10-CM | POA: Diagnosis not present

## 2018-05-20 ENCOUNTER — Other Ambulatory Visit: Payer: Self-pay | Admitting: Family Medicine

## 2018-05-20 MED ORDER — BENZONATATE 100 MG PO CAPS: 100.0000 mg | ORAL_CAPSULE | Freq: Two times a day (BID) | ORAL | 0 refills | Status: DC | PRN

## 2018-07-15 ENCOUNTER — Telehealth: Payer: Self-pay | Admitting: Family Medicine

## 2018-07-15 NOTE — Telephone Encounter (Signed)
Copied from CRM [id number]. Topic: Quick Communication - Rx Refill/Question >> Jul 15, 2018  1:10 PM Lyn Hollingshead, [facility] wrote: Medication: ACCU-CHEK AVIVA PLUS test strip  Has the patient contacted their pharmacy? Yes - states new script is needed (Agent: If no, request that the patient contact the pharmacy for the refill.) (Agent: If yes, when and what did the pharmacy advise?)  Preferred Pharmacy (with phone number or street name): [facility] - [place], [place] - [address] [phone] (Phone) [phone] (Fax)  Agent: Please be advised that RX refills may take up to 3 business days. We ask that you follow-up with your pharmacy.

## 2018-07-16 MED ORDER — ACCU-CHEK AVIVA PLUS W/DEVICE KIT: PACK | 12 refills | Status: DC

## 2018-07-19 NOTE — Telephone Encounter (Signed)
Pt Wife called in and stated that a monitor was sent to [employer] [facility] and not the strips.  Pt wife stated she would like the strip called in.    [facility]

## 2018-07-21 MED ORDER — ACCU-CHEK AVIVA PLUS VI STRP: ORAL_STRIP | 12 refills | Status: DC

## 2018-07-21 NOTE — Addendum Note (Signed)
Addended by: Vonita Moss A on: 07/21/2018 09:25 AM   Modules accepted: Orders

## 2018-07-31 ENCOUNTER — Ambulatory Visit (INDEPENDENT_AMBULATORY_CARE_PROVIDER_SITE_OTHER): Payer: Medicare HMO

## 2018-07-31 ENCOUNTER — Other Ambulatory Visit: Payer: Self-pay

## 2018-07-31 DIAGNOSIS — Z0000 Encounter for general adult medical examination without abnormal findings: Secondary | ICD-10-CM

## 2018-07-31 NOTE — Patient Instructions (Signed)
Carl Bryan , Thank you for taking time to come for your Medicare Wellness Visit. I appreciate your ongoing commitment to your health goals. Please review the following plan we discussed and let me know if I can assist you in the future.   Screening recommendations/referrals: Colonoscopy: completed 04/10/2012 Recommended yearly ophthalmology/optometry visit for glaucoma screening and checkup Recommended yearly dental visit for hygiene and checkup  Vaccinations: Influenza vaccine: up to date Pneumococcal vaccine: up to date Tdap vaccine: up to date Shingles vaccine: shingrix eligible, check with your insurance company for coverage     Advanced directives: Advance directive discussed with you today. Packet at home, if you have any questions please let us know.   Conditions/risks identified: none   Next appointment: Follow up in one year for your annual wellness exam.   Preventive Care 65 Years and Older, Male Preventive care refers to lifestyle choices and visits with your health care provider that can promote health and wellness. What does preventive care include?  A yearly physical exam. This is also called an annual well check.  Dental exams once or twice a year.  Routine eye exams. Ask your health care provider how often you should have your eyes checked.  Personal lifestyle choices, including:  Daily care of your teeth and gums.  Regular physical activity.  Eating a healthy diet.  Avoiding tobacco and drug use.  Limiting alcohol use.  Practicing safe sex.  Taking low doses of aspirin every day.  Taking vitamin and mineral supplements as recommended by your health care provider. What happens during an annual well check? The services and screenings done by your health care provider during your annual well check will depend on your age, overall health, lifestyle risk factors, and family history of disease. Counseling  Your health care provider may ask you questions about  your:  Alcohol use.  Tobacco use.  Drug use.  Emotional well-being.  Home and relationship well-being.  Sexual activity.  Eating habits.  History of falls.  Memory and ability to understand (cognition).  Work and work Astronomer. Screening  You may have the following tests or measurements:  Height, weight, and BMI.  Blood pressure.  Lipid and cholesterol levels. These may be checked every 5 years, or more frequently if you are over 28 years old.  Skin check.  Lung cancer screening. You may have this screening every year starting at age 30 if you have a 30-pack-year history of smoking and currently smoke or have quit within the past 15 years.  Fecal occult blood test (FOBT) of the stool. You may have this test every year starting at age 7.  Flexible sigmoidoscopy or colonoscopy. You may have a sigmoidoscopy every 5 years or a colonoscopy every 10 years starting at age 30.  Prostate cancer screening. Recommendations will vary depending on your family history and other risks.  Hepatitis C blood test.  Hepatitis B blood test.  Sexually transmitted disease (STD) testing.  Diabetes screening. This is done by checking your blood sugar (glucose) after you have not eaten for a while (fasting). You may have this done every 1-3 years.  Abdominal aortic aneurysm (AAA) screening. You may need this if you are a current or former smoker.  Osteoporosis. You may be screened starting at age 84 if you are at high risk. Talk with your health care provider about your test results, treatment options, and if necessary, the need for more tests. Vaccines  Your health care provider may recommend certain vaccines, such  as:  Influenza vaccine. This is recommended every year.  Tetanus, diphtheria, and acellular pertussis (Tdap, Td) vaccine. You may need a Td booster every 10 years.  Zoster vaccine. You may need this after age 58.  Pneumococcal 13-valent conjugate (PCV13) vaccine.  One dose is recommended after age 48.  Pneumococcal polysaccharide (PPSV23) vaccine. One dose is recommended after age 52. Talk to your health care provider about which screenings and vaccines you need and how often you need them. This information is not intended to replace advice given to you by your health care provider. Make sure you discuss any questions you have with your health care provider. Document Released: 04/23/2015 Document Revised: 12/15/2015 Document Reviewed: 01/26/2015 Elsevier Interactive Patient Education  2017 [place] Prevention in the Home Falls can cause injuries. They can happen to people of all ages. There are many things you can do to make your home safe and to help prevent falls. What can I do on the outside of my home?  Regularly fix the edges of walkways and driveways and fix any cracks.  Remove anything that might make you trip as you walk through a door, such as a raised step or threshold.  Trim any bushes or trees on the path to your home.  Use bright outdoor lighting.  Clear any walking paths of anything that might make someone trip, such as rocks or tools.  Regularly check to see if handrails are loose or broken. Make sure that both sides of any steps have handrails.  Any raised decks and porches should have guardrails on the edges.  Have any leaves, snow, or ice cleared regularly.  Use sand or salt on walking paths during winter.  Clean up any spills in your garage right away. This includes oil or grease spills. What can I do in the bathroom?  Use night lights.  Install grab bars by the toilet and in the tub and shower. Do not use towel bars as grab bars.  Use non-skid mats or decals in the tub or shower.  If you need to sit down in the shower, use a plastic, non-slip stool.  Keep the floor dry. Clean up any water that spills on the floor as soon as it happens.  Remove soap buildup in the tub or shower regularly.  Attach bath  mats securely with double-sided non-slip rug tape.  Do not have throw rugs and other things on the floor that can make you trip. What can I do in the bedroom?  Use night lights.  Make sure that you have a light by your bed that is easy to reach.  Do not use any sheets or blankets that are too big for your bed. They should not hang down onto the floor.  Have a firm chair that has side arms. You can use this for support while you get dressed.  Do not have throw rugs and other things on the floor that can make you trip. What can I do in the kitchen?  Clean up any spills right away.  Avoid walking on wet floors.  Keep items that you use a lot in easy-to-reach places.  If you need to reach something above you, use a strong step stool that has a grab bar.  Keep electrical cords out of the way.  Do not use floor polish or wax that makes floors slippery. If you must use wax, use non-skid floor wax.  Do not have throw rugs and other things on the  floor that can make you trip. What can I do with my stairs?  Do not leave any items on the stairs.  Make sure that there are handrails on both sides of the stairs and use them. Fix handrails that are broken or loose. Make sure that handrails are as long as the stairways.  Check any carpeting to make sure that it is firmly attached to the stairs. Fix any carpet that is loose or worn.  Avoid having throw rugs at the top or bottom of the stairs. If you do have throw rugs, attach them to the floor with carpet tape.  Make sure that you have a light switch at the top of the stairs and the bottom of the stairs. If you do not have them, ask someone to add them for you. What else can I do to help prevent falls?  Wear shoes that:  Do not have high heels.  Have rubber bottoms.  Are comfortable and fit you well.  Are closed at the toe. Do not wear sandals.  If you use a stepladder:  Make sure that it is fully opened. Do not climb a closed  stepladder.  Make sure that both sides of the stepladder are locked into place.  Ask someone to hold it for you, if possible.  Clearly mark and make sure that you can see:  Any grab bars or handrails.  First and last steps.  Where the edge of each step is.  Use tools that help you move around (mobility aids) if they are needed. These include:  Canes.  Walkers.  Scooters.  Crutches.  Turn on the lights when you go into a dark area. Replace any light bulbs as soon as they burn out.  Set up your furniture so you have a clear path. Avoid moving your furniture around.  If any of your floors are uneven, fix them.  If there are any pets around you, be aware of where they are.  Review your medicines with your doctor. Some medicines can make you feel dizzy. This can increase your chance of falling. Ask your doctor what other things that you can do to help prevent falls. This information is not intended to replace advice given to you by your health care provider. Make sure you discuss any questions you have with your health care provider. Document Released: 01/21/2009 Document Revised: 09/02/2015 Document Reviewed: 05/01/2014 Elsevier Interactive Patient Education  2017 [employer].

## 2018-07-31 NOTE — Progress Notes (Signed)
Subjective:   Carl Bryan is a 73 y.o. male who presents for Medicare Annual/Subsequent preventive examination.  This visit is being conducted via phone call due to the COVID-19 pandemic. This patient has given me verbal consent via phone to conduct this visit, patient states they are participating from their home address. Some vital signs may be absent or patient reported.   Patient identification: identified by name, DOB, and current address.    Review of Systems:   Cardiac Risk Factors include: advanced age (>74mn, >>71women);male gender;hypertension;diabetes mellitus;dyslipidemia     Objective:    Vitals: There were no vitals taken for this visit.  There is no height or weight on file to calculate BMI.   Unable to obtain vitals due to visit being conducted via telephonically.    Advanced Directives 07/31/2018 07/30/2017  Does Patient Have a Medical Advance Directive? No No  Would patient like information on creating a medical advance directive? No - Patient declined No - Patient declined    Tobacco Social History   Tobacco Use  Smoking Status Former Smoker  . Types: Cigarettes  . Last attempt to quit: 09/29/1964  . Years since quitting: 53.8  Smokeless Tobacco Never Used     Counseling given: Not Answered   Clinical Intake:  Pre-visit preparation completed: Yes  Pain : No/denies pain     Nutritional Risks: None Diabetes: No  How often do you need to have someone help you when you read instructions, pamphlets, or other written materials from your doctor or pharmacy?: 2 - Rarely What is the last grade level you completed in school?: 6th grade  Nutrition Risk Assessment:  Has the patient had any N/V/D within the last 2 months?  No  Does the patient have any non-healing wounds?  No  Has the patient had any unintentional weight loss or weight gain?  No   Diabetes:  Is the patient diabetic?  Yes  If diabetic, was a CBG obtained today?  No  Did the patient  bring in their glucometer from home?  No  How often do you monitor your CBG's? Every morning and in the evening if needed  Financial Strains and Diabetes Management:  Are you having any financial strains with the device, your supplies or your medication? No .  Does the patient want to be seen by Chronic Care Management for management of their diabetes?  No  Would the patient like to be referred to a Nutritionist or for Diabetic Management?  No   Diabetic Exams:  Diabetic Eye Exam: Completed 07/27/17. Due now,  Pt has been advised about the importance in completing this exam.patient will call when office reopens.   Diabetic Foot Exam: Pt has been advised about the importance in completing this exam. Pt is scheduled for diabetic foot exam on 10/01/2018  Interpreter Needed?: No  Information entered by :: Tiffany Hill,LPN   Past Medical History:  Diagnosis Date  . Bleeding ulcer   . Bronchospasm   . Diabetes mellitus without complication (HCC)    Type 2  . Diverticulosis   . Pure hypercholesterolemia   . Traumatic amputation of leg above knee ([facility]    Past Surgical History:  Procedure Laterality Date  . ABOVE KNEE LEG AMPUTATION  1971  . APPENDECTOMY    . KNEE SURGERY     x 2.   Family History  Problem Relation Age of Onset  . Cancer Mother        Colon cancer  .  Lung disease Father    Social History   Socioeconomic History  . Marital status: Married    Spouse name: Not on file  . Number of children: Not on file  . Years of education: Not on file  . Highest education level: 6th grade  Occupational History  . Occupation: Retired  Scientific laboratory technician  . Financial resource strain: Not hard at all  . Food insecurity:    Worry: Never true    Inability: Never true  . Transportation needs:    Medical: No    Non-medical: No  Tobacco Use  . Smoking status: Former Smoker    Types: Cigarettes    Last attempt to quit: 09/29/1964    Years since quitting: 53.8  . Smokeless  tobacco: Never Used  Substance and Sexual Activity  . Alcohol use: No    Alcohol/week: 0.0 standard drinks  . Drug use: No  . Sexual activity: Not on file  Lifestyle  . Physical activity:    Days per week: 0 days    Minutes per session: 0 min  . Stress: Not at all  Relationships  . Social connections:    Talks on phone: More than three times a week    Gets together: More than three times a week    Attends religious service: More than 4 times per year    Active member of club or organization: Yes    Attends meetings of clubs or organizations: More than 4 times per year    Relationship status: Married  Other Topics Concern  . Not on file  Social History Narrative  . Not on file    Outpatient Encounter Medications as of 07/31/2018  Medication Sig  . ACCU-CHEK AVIVA PLUS test strip Check 1 time a day  . ACCU-CHEK SOFTCLIX LANCETS lancets   . allopurinol (ZYLOPRIM) 300 MG tablet Take 1 tablet (300 mg total) by mouth daily.  Marland Kitchen amLODipine (NORVASC) 10 MG tablet Take 1 tablet (10 mg total) by mouth daily.  . Blood Glucose Monitoring Suppl (ACCU-CHEK AVIVA PLUS) w/Device KIT Test 1 time daily  . cetirizine (ZYRTEC) 10 MG tablet Take 1 tablet (10 mg total) by mouth daily.  Marland Kitchen glipiZIDE (GLUCOTROL) 5 MG tablet Take 1 tablet (5 mg total) by mouth daily before breakfast.  . losartan (COZAAR) 100 MG tablet Take 1 tablet (100 mg total) by mouth daily.  . metFORMIN (GLUCOPHAGE) 500 MG tablet Take 2 tablets (1,000 mg total) by mouth 2 (two) times daily with a meal.  . rosuvastatin (CRESTOR) 40 MG tablet Take 1 tablet (40 mg total) by mouth daily.  . [DISCONTINUED] benzonatate (TESSALON) 100 MG capsule Take 1 capsule (100 mg total) by mouth 2 (two) times daily as needed for cough. (Patient not taking: Reported on 07/31/2018)  . [DISCONTINUED] fluticasone (FLONASE) 50 MCG/ACT nasal spray Place 2 sprays into both nostrils 2 (two) times daily. (Patient not taking: Reported on 07/31/2018)  .  [DISCONTINUED] fluticasone (FLONASE) 50 MCG/ACT nasal spray Place 2 sprays into both nostrils 2 (two) times daily. (Patient not taking: Reported on 07/31/2018)   No facility-administered encounter medications on file as of 07/31/2018.     Activities of Daily Living In your present state of health, do you have any difficulty performing the following activities: 07/31/2018  Hearing? Y  Comment deaf in right ear, getting hearing aids   Vision? N  Difficulty concentrating or making decisions? N  Walking or climbing stairs? N  Dressing or bathing? N  Doing errands, shopping?  N  Preparing Food and eating ? N  Using the Toilet? N  In the past six months, have you accidently leaked urine? N  Do you have problems with loss of bowel control? N  Managing your Medications? N  Managing your Finances? N  Housekeeping or managing your Housekeeping? N  Some recent data might be hidden    Patient Care Team: Guadalupe Maple, MD as PCP - General (Family Medicine)   Assessment:   This is a routine wellness examination for [place].  Exercise Activities and Dietary recommendations Current Exercise Habits: The patient does not participate in regular exercise at present, Exercise limited by: None identified  Goals    . DIET - INCREASE WATER INTAKE     Recommend continue drinking at least 6-8 glasses of water a day.       Fall Risk Fall Risk  07/31/2018 02/13/2018 07/30/2017 04/24/2017 01/17/2017  Falls in the past year? 0 1 No No No  Number falls in past yr: - - - - -  Injury with Fall? - 0 - - -  Risk for fall due to : - History of fall(s);Other (Comment);Impaired balance/gait - - -  Follow up - Falls evaluation completed - - -   FALL RISK PREVENTION PERTAINING TO THE HOME:  Any stairs in or around the home? Yes  outdoor steps  If so, are there any without handrails? No   Home free of loose throw rugs in walkways, pet beds, electrical cords, etc? Yes  Adequate lighting in your home to reduce risk  of falls? Yes   ASSISTIVE DEVICES UTILIZED TO PREVENT FALLS:  Life alert? No  Use of a cane, walker or w/c? Yes  crutches  Grab bars in the bathroom? Yes  Shower chair or bench in shower? Yes  Elevated toilet seat or a handicapped toilet? Yes    TIMED UP AND GO:  Unable to perform.    Depression Screen PHQ 2/9 Scores 07/31/2018 07/30/2017 04/24/2017 01/17/2017  PHQ - 2 Score 0 0 0 0    Cognitive Function  Patient declined      6CIT Screen 07/30/2017  What Year? 0 points  What month? 0 points  What time? 0 points  Count back from 20 0 points  Months in reverse 0 points  Repeat phrase 2 points  Total Score 2    Immunization History  Administered Date(s) Administered  . Influenza, High Dose Seasonal PF 01/19/2016, 01/17/2017, 01/14/2018  . Influenza,inj,Quad PF,6+ Mos 12/31/2014  . Influenza-Unspecified 02/11/2014  . Pneumococcal Conjugate-13 10/08/2013  . Pneumococcal Polysaccharide-23 07/06/2015  . Pneumococcal-Unspecified 05/20/2008  . Td 12/02/2003  . Tdap 10/19/2015    Qualifies for Shingles Vaccine? Yes  Zostavax completed unknown date.  Due for Shingrix. Education has been provided regarding the importance of this vaccine. Pt has been advised to call insurance company to determine out of pocket expense. Advised may also receive vaccine at local pharmacy or Health Dept. Verbalized acceptance and understanding.  Tdap: up to date   Flu Vaccine: up to date   Pneumococcal Vaccine: up to date   Screening Tests Health Maintenance  Topic Date Due  . FOOT EXAM  03/28/2016  . OPHTHALMOLOGY EXAM  07/28/2018  . HEMOGLOBIN A1C  09/27/2018  . INFLUENZA VACCINE  11/09/2018  . COLONOSCOPY  04/10/2022  . TETANUS/TDAP  10/18/2025  . Hepatitis C Screening  Completed  . PNA vac Low Risk Adult  Completed   Cancer Screenings:  Colorectal Screening: Completed 04/10/2012. Repeat every  10 years  Additional Screening:  Hepatitis C Screening: does qualify; Completed  07/06/2015  Dental Screening: Recommended annual dental exams for proper oral hygiene  Community Resource Referral:  CRR required this visit?  No        Plan:  I have personally reviewed and addressed the Medicare Annual Wellness questionnaire and have noted the following in the patient's chart:  A. Medical and social history B. Use of alcohol, tobacco or illicit drugs  C. Current medications and supplements D. Functional ability and status E.  Nutritional status F.  Physical activity G. Advance directives H. List of other physicians I.  Hospitalizations, surgeries, and ER visits in previous 12 months J.  [place] such as hearing and vision if needed, cognitive and depression L. Referrals and appointments   In addition, I have reviewed and discussed with patient certain preventive protocols, quality metrics, and best practice recommendations. A written personalized care plan for preventive services as well as general preventive health recommendations were offered to patient via mail. He prefers to pick it up next time he is in the office.    Signed,   Bevelyn Ngo, LPN  6/96/2952 Nurse Health Advisor  Nurse Notes: none

## 2018-08-01 ENCOUNTER — Ambulatory Visit: Payer: Self-pay

## 2018-08-02 ENCOUNTER — Ambulatory Visit: Payer: Self-pay

## 2018-09-10 ENCOUNTER — Encounter: Payer: Self-pay | Admitting: Nurse Practitioner

## 2018-09-10 ENCOUNTER — Ambulatory Visit (INDEPENDENT_AMBULATORY_CARE_PROVIDER_SITE_OTHER): Payer: Medicare HMO | Admitting: Nurse Practitioner

## 2018-09-10 ENCOUNTER — Other Ambulatory Visit: Payer: Self-pay

## 2018-09-10 DIAGNOSIS — S91002A Unspecified open wound, left ankle, initial encounter: Secondary | ICD-10-CM

## 2018-09-10 MED ORDER — CEPHALEXIN 500 MG PO CAPS: 500.0000 mg | ORAL_CAPSULE | Freq: Two times a day (BID) | ORAL | 0 refills | Status: AC

## 2018-09-10 MED ORDER — MUPIROCIN 2 % EX OINT: 1.0000 "application " | TOPICAL_OINTMENT | Freq: Two times a day (BID) | CUTANEOUS | 0 refills | Status: DC

## 2018-09-10 NOTE — Assessment & Plan Note (Signed)
Acute x 2 superficial abrasions, up to date on Tetanus.  Due to diabetes and wound assessment will initiate abx treatment, allergic to Doxycycline.  Script for Keflex 500 MG BID sent x 7 days, will extend if needed (max daily dose for renal function is 1000 MG/day on review).  Mupirocin script sent.  Educated on wound care at home.  Continue to cleanse area twice a day and apply Mupirocin.  Educated on s/s of worsening infection that he is to immediately be seen for, he was able to verbalize these back to provider.  Return in office in 3 days for wound check.

## 2018-09-10 NOTE — Patient Instructions (Signed)
Wound Care, Adult  Taking care of your wound properly can help to prevent pain, infection, and scarring. It can also help your wound to heal more quickly.  How to care for your wound  Wound care          Follow instructions from your health care provider about how to take care of your wound. Make sure you:  ? Wash your hands with soap and water before you change the bandage (dressing). If soap and water are not available, use hand sanitizer.  ? Change your dressing as told by your health care provider.  ? Leave stitches (sutures), skin glue, or adhesive strips in place. These skin closures may need to stay in place for 2 weeks or longer. If adhesive strip edges start to loosen and curl up, you may trim the loose edges. Do not remove adhesive strips completely unless your health care provider tells you to do that.   Check your wound area every day for signs of infection. Check for:  ? Redness, swelling, or pain.  ? Fluid or blood.  ? Warmth.  ? Pus or a bad smell.   Ask your health care provider if you should clean the wound with mild soap and water. Doing this may include:  ? Using a clean towel to pat the wound dry after cleaning it. Do not rub or scrub the wound.  ? Applying a cream or ointment. Do this only as told by your health care provider.  ? Covering the incision with a clean dressing.   Ask your health care provider when you can leave the wound uncovered.   Keep the dressing dry until your health care provider says it can be removed. Do not take baths, swim, use a hot tub, or do anything that would put the wound underwater until your health care provider approves. Ask your health care provider if you can take showers. You may only be allowed to take sponge baths.  Medicines     If you were prescribed an antibiotic medicine, cream, or ointment, take or use the antibiotic as told by your health care provider. Do not stop taking or using the antibiotic even if your condition improves.   Take  over-the-counter and prescription medicines only as told by your health care provider. If you were prescribed pain medicine, take it 30 or more minutes before you do any wound care or as told by your health care provider.  General instructions   Return to your normal activities as told by your health care provider. Ask your health care provider what activities are safe.   Do not scratch or pick at the wound.   Do not use any products that contain nicotine or tobacco, such as cigarettes and e-cigarettes. These may delay wound healing. If you need help quitting, ask your health care provider.   Keep all follow-up visits as told by your health care provider. This is important.   Eat a diet that includes protein, vitamin A, vitamin C, and other nutrient-rich foods to help the wound heal.  ? Foods rich in protein include meat, dairy, beans, nuts, and other sources.  ? Foods rich in vitamin A include carrots and dark green, leafy vegetables.  ? Foods rich in vitamin C include citrus, tomatoes, and other fruits and vegetables.  ? Nutrient-rich foods have protein, carbohydrates, fat, vitamins, or minerals. Eat a variety of healthy foods including vegetables, fruits, and whole grains.  Contact a health care provider if:     You received a tetanus shot and you have swelling, severe pain, redness, or bleeding at the injection site.   Your pain is not controlled with medicine.   You have redness, swelling, or pain around the wound.   You have fluid or blood coming from the wound.   Your wound feels warm to the touch.   You have pus or a bad smell coming from the wound.   You have a fever or chills.   You are nauseous or you vomit.   You are dizzy.  Get help right away if:   You have a red streak going away from your wound.   The edges of the wound open up and separate.   Your wound is bleeding, and the bleeding does not stop with gentle pressure.   You have a rash.   You faint.   You have trouble  breathing.  Summary   Always wash your hands with soap and water before changing your bandage (dressing).   To help with healing, eat foods that are rich in protein, vitamin A, vitamin C, and other nutrients.   Check your wound every day for signs of infection. Contact your health care provider if you suspect that your wound is infected.  This information is not intended to replace advice given to you by your health care provider. Make sure you discuss any questions you have with your health care provider.  Document Released: 01/04/2008 Document Revised: 05/08/2017 Document Reviewed: 10/12/2015  Elsevier Interactive Patient Education  2019 [employer]
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
## 2018-09-13 ENCOUNTER — Ambulatory Visit (INDEPENDENT_AMBULATORY_CARE_PROVIDER_SITE_OTHER): Payer: Medicare HMO | Admitting: Family Medicine

## 2018-09-13 ENCOUNTER — Other Ambulatory Visit: Payer: Self-pay

## 2018-09-13 ENCOUNTER — Encounter: Payer: Self-pay | Admitting: Family Medicine

## 2018-09-13 VITALS — BP 134/72 | HR 65 | Temp 98.2°F | Ht 66.0 in | Wt 172.0 lb

## 2018-09-13 DIAGNOSIS — S91002A Unspecified open wound, left ankle, initial encounter: Secondary | ICD-10-CM

## 2018-09-13 NOTE — Progress Notes (Signed)
BP 134/72   Pulse 65   Temp 98.2 F (36.8 C) (Oral)   Ht 5\' 6"  (1.676 m)   Wt 172 lb (78 kg)   SpO2 95%   BMI 27.76 kg/m    Subjective:    Patient ID: Carl Bryan, male    DOB: [birth year], 73 y.o.   MRN: [id number]  HPI: Carl Bryan is a 73 y.o. male  Chief Complaint  Patient presents with  . Wound Check    f/u   Doing well. Healing well. He has been tolerating his medication well. Feeling well. Continuing to prop leg up. No other concerns today.  Relevant past medical, surgical, family and social history reviewed and updated as indicated. Interim medical history since our last visit reviewed. Allergies and medications reviewed and updated.  Review of Systems  Constitutional: Negative.   Respiratory: Negative.   Cardiovascular: Negative.   Skin: Positive for color change and wound. Negative for pallor and rash.  Neurological: Negative.   Psychiatric/Behavioral: Negative.     Per HPI unless specifically indicated above     Objective:    BP 134/72   Pulse 65   Temp 98.2 F (36.8 C) (Oral)   Ht 5\' 6"  (1.676 m)   Wt 172 lb (78 kg)   SpO2 95%   BMI 27.76 kg/m   Wt Readings from Last 3 Encounters:  09/13/18 172 lb (78 kg)  03/28/18 167 lb 4 oz (75.9 kg)  02/13/18 169 lb (76.7 kg)    Physical Exam Vitals signs and nursing note reviewed.  Constitutional:      General: He is not in acute distress.    Appearance: Normal appearance. He is not ill-appearing, toxic-appearing or diaphoretic.  HENT:     Head: Normocephalic and atraumatic.     Right Ear: External ear normal.     Left Ear: External ear normal.     Nose: Nose normal.     Mouth/Throat:     Mouth: Mucous membranes are moist.     Pharynx: Oropharynx is clear.  Eyes:     General: No scleral icterus.       Right eye: No discharge.        Left eye: No discharge.     Extraocular Movements: Extraocular movements intact.     Conjunctiva/sclera: Conjunctivae normal.     Pupils: Pupils are equal, round,  and reactive to light.  Neck:     Musculoskeletal: Normal range of motion and neck supple.  Cardiovascular:     Rate and Rhythm: Normal rate and regular rhythm.     Pulses: Normal pulses.     Heart sounds: Normal heart sounds. No murmur. No friction rub. No gallop.   Pulmonary:     Effort: Pulmonary effort is normal. No respiratory distress.     Breath sounds: Normal breath sounds. No stridor. No wheezing, rhonchi or rales.  Chest:     Chest wall: No tenderness.  Musculoskeletal: Normal range of motion.  Skin:    General: Skin is warm and dry.     Capillary Refill: Capillary refill takes less than 2 seconds.     Coloration: Skin is not jaundiced or pale.     Findings: No bruising, erythema, lesion or rash.     Comments: Flat wounds on R ankle, mild swelling, mild redness, mild heat. Good granulation tissue, no pain with palpation around wound  Neurological:     General: No focal deficit present.     Mental Status:  He is alert and oriented to person, place, and time. Mental status is at baseline.  Psychiatric:        Mood and Affect: Mood normal.        Behavior: Behavior normal.        Thought Content: Thought content normal.        Judgment: Judgment normal.     Results for orders placed or performed in visit on 03/28/18  Bayer DCA Hb A1c Waived  Result Value Ref Range   HB A1C (BAYER DCA - WAIVED) 7.2 (H) <7.0 %  LP+ALT+AST Piccolo, Waived  Result Value Ref Range   ALT (SGPT) Piccolo, Waived 35 10 - 47 U/L   AST (SGOT) Piccolo, Waived 28 11 - 38 U/L   Cholesterol Piccolo, Waived 122 <200 mg/dL   HDL Chol Piccolo, Waived 35 (L) >59 mg/dL   Triglycerides Piccolo,Waived 232 (H) <150 mg/dL   Chol/HDL Ratio Piccolo,Waive 3.5 mg/dL   LDL Chol Calc Piccolo Waived 41 <100 mg/dL   VLDL Chol Calc Piccolo,Waive 46 (H) <30 mg/dL  Basic metabolic panel  Result Value Ref Range   Glucose 219 (H) 65 - 99 mg/dL   BUN 17 8 - 27 mg/dL   Creatinine, Ser 1.611.07 0.76 - 1.27 mg/dL   GFR  calc non Af Amer 68 >59 mL/min/1.73   GFR calc Af Amer 79 >59 mL/min/1.73   BUN/Creatinine Ratio 16 10 - 24   Sodium 139 134 - 144 mmol/L   Potassium 4.0 3.5 - 5.2 mmol/L   Chloride 102 96 - 106 mmol/L   CO2 21 20 - 29 mmol/L   Calcium 10.1 8.6 - 10.2 mg/dL      Assessment & Plan:   Problem List Items Addressed This Visit      Other   Wound of left ankle - Primary    Healing well. Continue antibiotics and recheck next week. Call with any concerns.           Follow up plan: Return Next week, for follow up wound.

## 2018-09-13 NOTE — Assessment & Plan Note (Signed)
Healing well. Continue antibiotics and recheck next week. Call with any concerns.

## 2018-09-19 ENCOUNTER — Other Ambulatory Visit: Payer: Self-pay

## 2018-09-19 ENCOUNTER — Ambulatory Visit (INDEPENDENT_AMBULATORY_CARE_PROVIDER_SITE_OTHER): Payer: Medicare HMO | Admitting: Family Medicine

## 2018-09-19 ENCOUNTER — Encounter: Payer: Self-pay | Admitting: Family Medicine

## 2018-09-19 VITALS — BP 131/72 | HR 57 | Temp 97.9°F | Ht 66.0 in | Wt 173.0 lb

## 2018-09-19 DIAGNOSIS — I10 Essential (primary) hypertension: Secondary | ICD-10-CM | POA: Diagnosis not present

## 2018-09-19 DIAGNOSIS — M109 Gout, unspecified: Secondary | ICD-10-CM | POA: Diagnosis not present

## 2018-09-19 DIAGNOSIS — Z125 Encounter for screening for malignant neoplasm of prostate: Secondary | ICD-10-CM | POA: Diagnosis not present

## 2018-09-19 DIAGNOSIS — E7800 Pure hypercholesterolemia, unspecified: Secondary | ICD-10-CM | POA: Diagnosis not present

## 2018-09-19 DIAGNOSIS — E1169 Type 2 diabetes mellitus with other specified complication: Secondary | ICD-10-CM | POA: Diagnosis not present

## 2018-09-19 DIAGNOSIS — S78111D Complete traumatic amputation at level between right hip and knee, subsequent encounter: Secondary | ICD-10-CM

## 2018-09-19 DIAGNOSIS — S91002A Unspecified open wound, left ankle, initial encounter: Secondary | ICD-10-CM | POA: Diagnosis not present

## 2018-09-19 NOTE — Assessment & Plan Note (Signed)
Under good control on current regimen. Continue current regimen. Continue to monitor. Call with any concerns. Refills good for another 3 months, will hold off on refills at this time. Needs to see PCP in 3 months. Labs checked today. Await results.

## 2018-09-19 NOTE — Assessment & Plan Note (Signed)
Under good control on current regimen. Continue current regimen. Continue to monitor. Call with any concerns. Refills good for another 3 months, will hold off on refills at this time. Needs to see PCP in 3 months. Labs checked today.

## 2018-09-19 NOTE — Progress Notes (Signed)
BP 131/72   Pulse (!) 57   Temp 97.9 F (36.6 C) (Oral)   Ht 5\' 6"  (1.676 m)   Wt 173 lb (78.5 kg)   SpO2 94%   BMI 27.92 kg/m    Subjective:    Patient ID: Carl Bryan, male    DOB: 1945-10-22, 73 y.o.   MRN: [id number]  HPI: Carl Bryan is a 73 y.o. male  Chief Complaint  Patient presents with  . Wound Check    f/u   Healing well. Not red. Not swollen. Has been keeping a bandage on it. Healing well. No concerns.   HYPERTENSION / HYPERLIPIDEMIA Satisfied with current treatment? yes Duration of hypertension: chronic BP monitoring frequency: not checking BP medication side effects: no Past BP meds: amlodipine, losartan Duration of hyperlipidemia: chronic Cholesterol medication side effects: no Cholesterol supplements: none Past cholesterol medications: crestor Medication compliance: excellent compliance Aspirin: no Recent stressors: no Recurrent headaches: no Visual changes: no Palpitations: no Dyspnea: no Chest pain: no Lower extremity edema: no Dizzy/lightheaded: no  DIABETES Hypoglycemic episodes:no Polydipsia/polyuria: no Visual disturbance: no Chest pain: no Paresthesias: no Glucose Monitoring: no  Accucheck frequency: Not Checking Taking Insulin?: no Blood Pressure Monitoring: not checking Retinal Examination: Not up to Date Foot Exam: Up to Date Diabetic Education: Completed Pneumovax: Up to Date Influenza: Up to Date Aspirin: no  No gout flares. Feeling well. No other concerns.   Relevant past medical, surgical, family and social history reviewed and updated as indicated. Interim medical history since our last visit reviewed. Allergies and medications reviewed and updated.  Review of Systems  Constitutional: Negative.   Respiratory: Negative.   Cardiovascular: Negative.   Gastrointestinal: Negative.   Musculoskeletal: Negative.   Skin: Positive for wound. Negative for color change, pallor and rash.  Neurological: Negative.    Psychiatric/Behavioral: Negative.     Per HPI unless specifically indicated above     Objective:    BP 131/72   Pulse (!) 57   Temp 97.9 F (36.6 C) (Oral)   Ht 5\' 6"  (1.676 m)   Wt 173 lb (78.5 kg)   SpO2 94%   BMI 27.92 kg/m   Wt Readings from Last 3 Encounters:  09/19/18 173 lb (78.5 kg)  09/13/18 172 lb (78 kg)  03/28/18 167 lb 4 oz (75.9 kg)    Physical Exam Vitals signs and nursing note reviewed.  Constitutional:      General: He is not in acute distress.    Appearance: Normal appearance. He is not ill-appearing, toxic-appearing or diaphoretic.  HENT:     Head: Normocephalic and atraumatic.     Right Ear: External ear normal.     Left Ear: External ear normal.     Nose: Nose normal.     Mouth/Throat:     Mouth: Mucous membranes are moist.     Pharynx: Oropharynx is clear.  Eyes:     General: No scleral icterus.       Right eye: No discharge.        Left eye: No discharge.     Extraocular Movements: Extraocular movements intact.     Conjunctiva/sclera: Conjunctivae normal.     Pupils: Pupils are equal, round, and reactive to light.  Neck:     Musculoskeletal: Normal range of motion and neck supple.  Cardiovascular:     Rate and Rhythm: Normal rate and regular rhythm.     Pulses: Normal pulses.     Heart sounds: Normal heart sounds. No  murmur. No friction rub. No gallop.   Pulmonary:     Effort: Pulmonary effort is normal. No respiratory distress.     Breath sounds: Normal breath sounds. No stridor. No wheezing, rhonchi or rales.  Chest:     Chest wall: No tenderness.  Musculoskeletal: Normal range of motion.     Comments: AKA of R leg  Skin:    General: Skin is warm and dry.     Capillary Refill: Capillary refill takes less than 2 seconds.     Coloration: Skin is not jaundiced or pale.     Findings: No bruising, erythema, lesion or rash.     Comments: Flat wounds on L ankle, mild swelling, mild redness, mild heat. Good granulation tissue, no pain with  palpation around wound   Neurological:     General: No focal deficit present.     Mental Status: He is alert and oriented to person, place, and time. Mental status is at baseline.  Psychiatric:        Mood and Affect: Mood normal.        Behavior: Behavior normal.        Thought Content: Thought content normal.        Judgment: Judgment normal.     Results for orders placed or performed in visit on 03/28/18  Bayer DCA Hb A1c Waived  Result Value Ref Range   HB A1C (BAYER DCA - WAIVED) 7.2 (H) <7.0 %  LP+ALT+AST Piccolo, Waived  Result Value Ref Range   ALT (SGPT) Piccolo, Waived 35 10 - 47 U/L   AST (SGOT) Piccolo, Waived 28 11 - 38 U/L   Cholesterol Piccolo, Waived 122 <200 mg/dL   HDL Chol Piccolo, Waived 35 (L) >59 mg/dL   Triglycerides Piccolo,Waived 232 (H) <150 mg/dL   Chol/HDL Ratio Piccolo,Waive 3.5 mg/dL   LDL Chol Calc Piccolo Waived 41 <100 mg/dL   VLDL Chol Calc Piccolo,Waive 46 (H) <30 mg/dL  Basic metabolic panel  Result Value Ref Range   Glucose 219 (H) 65 - 99 mg/dL   BUN 17 8 - 27 mg/dL   Creatinine, Ser 1.611.07 0.76 - 1.27 mg/dL   GFR calc non Af Amer 68 >59 mL/min/1.73   GFR calc Af Amer 79 >59 mL/min/1.73   BUN/Creatinine Ratio 16 10 - 24   Sodium 139 134 - 144 mmol/L   Potassium 4.0 3.5 - 5.2 mmol/L   Chloride 102 96 - 106 mmol/L   CO2 21 20 - 29 mmol/L   Calcium 10.1 8.6 - 10.2 mg/dL      Assessment & Plan:   Problem List Items Addressed This Visit      Cardiovascular and Mediastinum   Essential (primary) hypertension - Primary    Under good control on current regimen. Continue current regimen. Continue to monitor. Call with any concerns. Refills good for another 3 months, will hold off on refills at this time. Needs to see PCP in 3 months. Labs checked today.        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 Continue current regimen. Continue to monitor. Call with any concerns. Refills good for another 3 months, will hold off on refills at this time. Needs to see PCP in 3 months. Labs checked  today. Await results.       Relevant Orders   Bayer DCA Hb A1c Waived   CBC with Differential/Platelet   Comprehensive metabolic panel   Microalbumin, Urine Waived   TSH   UA/M w/rflx Culture, Routine     Other   Pure hypercholesterolemia    Under good control on current regimen. Continue current regimen. Continue to monitor. Call with any concerns. Refills good for another 3 months, will hold off on refills at this time. Needs to see PCP in 3 months. Labs checked today.       Relevant Orders   CBC with Differential/Platelet   Comprehensive metabolic panel   Lipid Panel w/o Chol/HDL Ratio   TSH   Traumatic amputation of right leg above knee (HCC)    No concerns. Stump looks good. Continue to monitor.       Gout    Feeling well. No flares. Checking labs today. Await results. Call with any concerns.       Relevant Orders   CBC with Differential/Platelet   Comprehensive metabolic panel   TSH   Uric acid   Wound of left ankle    Continues to heal well. Good granulation tissue. Call if not completely healed in 2 weeks. Continue to monitor.       Relevant Orders   CBC with Differential/Platelet   Comprehensive metabolic panel    Other Visit Diagnoses    Screening for prostate cancer       Labs drawn today. Await results.    Relevant Orders   PSA       Follow up plan: Return in about 3 months (around 12/20/2018) for Wellness/Physical/DM visit .

## 2018-09-19 NOTE — Assessment & Plan Note (Signed)
Continues to heal well. Good granulation tissue. Call if not completely healed in 2 weeks. Continue to monitor.

## 2018-09-19 NOTE — Assessment & Plan Note (Signed)
Feeling well. No flares. Checking labs today. Await results. Call with any concerns.

## 2018-09-19 NOTE — Assessment & Plan Note (Signed)
Under good control on current regimen. Continue current regimen. Continue to monitor. Call with any concerns. Refills good for another 3 months, will hold off on refills at this time. Needs to see PCP in 3 months. Labs checked today.  

## 2018-09-19 NOTE — Assessment & Plan Note (Signed)
No concerns. Stump looks good. Continue to monitor.

## 2018-09-20 LAB — UA/M W/RFLX CULTURE, ROUTINE
Bilirubin, UA: NEGATIVE
Leukocytes,UA: NEGATIVE
Nitrite, UA: NEGATIVE
Specific Gravity, UA: 1.025 (ref 1.005–1.030)
Urobilinogen, Ur: 4 mg/dL — ABNORMAL HIGH (ref 0.2–1.0)
pH, UA: 6 (ref 5.0–7.5)

## 2018-09-20 LAB — MICROSCOPIC EXAMINATION
Bacteria, UA: NONE SEEN
WBC, UA: NONE SEEN /hpf (ref 0–5)

## 2018-09-20 LAB — COMPREHENSIVE METABOLIC PANEL
ALT: 25 IU/L (ref 0–44)
AST: 13 IU/L (ref 0–40)
Albumin/Globulin Ratio: 2 (ref 1.2–2.2)
Albumin: 4.3 g/dL (ref 3.7–4.7)
Alkaline Phosphatase: 85 IU/L (ref 39–117)
BUN/Creatinine Ratio: 15 (ref 10–24)
BUN: 18 mg/dL (ref 8–27)
Bilirubin Total: 0.5 mg/dL (ref 0.0–1.2)
CO2: 21 mmol/L (ref 20–29)
Calcium: 9.9 mg/dL (ref 8.6–10.2)
Chloride: 104 mmol/L (ref 96–106)
Creatinine, Ser: 1.17 mg/dL (ref 0.76–1.27)
GFR calc Af Amer: 71 mL/min/{1.73_m2} (ref >59)
GFR calc non Af Amer: 61 mL/min/{1.73_m2} (ref >59)
Globulin, Total: 2.1 g/dL (ref 1.5–4.5)
Glucose: 282 mg/dL — ABNORMAL HIGH (ref 65–99)
Potassium: 4.2 mmol/L (ref 3.5–5.2)
Sodium: 139 mmol/L (ref 134–144)
Total Protein: 6.4 g/dL (ref 6.0–8.5)

## 2018-09-20 LAB — MICROALBUMIN, URINE WAIVED
Creatinine, Urine Waived: 100 mg/dL (ref 10–300)
Microalb, Ur Waived: 150 mg/L — ABNORMAL HIGH (ref 0–19)
Microalb/Creat Ratio: >300 mg/g — ABNORMAL HIGH (ref <30)

## 2018-09-20 LAB — CBC WITH DIFFERENTIAL/PLATELET
Basophils Absolute: 0.1 10*3/uL (ref 0.0–0.2)
Basos: 1 %
EOS (ABSOLUTE): 0.2 10*3/uL (ref 0.0–0.4)
Eos: 3 %
Hematocrit: 43.6 % (ref 37.5–51.0)
Hemoglobin: 15.1 g/dL (ref 13.0–17.7)
Immature Grans (Abs): 0 10*3/uL (ref 0.0–0.1)
Immature Granulocytes: 0 %
Lymphocytes Absolute: 1.9 10*3/uL (ref 0.7–3.1)
Lymphs: 26 %
MCH: 32.8 pg (ref 26.6–33.0)
MCHC: 34.6 g/dL (ref 31.5–35.7)
MCV: 95 fL (ref 79–97)
Monocytes Absolute: 0.5 10*3/uL (ref 0.1–0.9)
Monocytes: 7 %
Neutrophils Absolute: 4.7 10*3/uL (ref 1.4–7.0)
Neutrophils: 63 %
Platelets: 250 10*3/uL (ref 150–450)
RBC: 4.6 x10E6/uL (ref 4.14–5.80)
RDW: 13.2 % (ref 11.6–15.4)
WBC: 7.4 10*3/uL (ref 3.4–10.8)

## 2018-09-20 LAB — LIPID PANEL W/O CHOL/HDL RATIO
Cholesterol, Total: 109 mg/dL (ref 100–199)
HDL: 36 mg/dL — ABNORMAL LOW (ref >39)
LDL Calculated: 42 mg/dL (ref 0–99)
Triglycerides: 155 mg/dL — ABNORMAL HIGH (ref 0–149)
VLDL Cholesterol Cal: 31 mg/dL (ref 5–40)

## 2018-09-20 LAB — BAYER DCA HB A1C WAIVED: HB A1C (BAYER DCA - WAIVED): 8 % — ABNORMAL HIGH (ref <7.0)

## 2018-09-20 LAB — URIC ACID: Uric Acid: 1.9 mg/dL — ABNORMAL LOW (ref 3.7–8.6)

## 2018-09-20 LAB — TSH: TSH: 1.85 u[IU]/mL (ref 0.450–4.500)

## 2018-09-20 LAB — PSA: Prostate Specific Ag, Serum: 1.4 ng/mL (ref 0.0–4.0)

## 2018-09-27 ENCOUNTER — Encounter: Payer: Self-pay | Admitting: Family Medicine

## 2018-10-01 ENCOUNTER — Encounter: Payer: Self-pay | Admitting: Family Medicine

## 2018-10-01 ENCOUNTER — Ambulatory Visit (INDEPENDENT_AMBULATORY_CARE_PROVIDER_SITE_OTHER): Payer: Medicare HMO | Admitting: Family Medicine

## 2018-10-01 ENCOUNTER — Other Ambulatory Visit: Payer: Self-pay

## 2018-10-01 DIAGNOSIS — I2510 Atherosclerotic heart disease of native coronary artery without angina pectoris: Secondary | ICD-10-CM

## 2018-10-01 DIAGNOSIS — E7800 Pure hypercholesterolemia, unspecified: Secondary | ICD-10-CM

## 2018-10-01 DIAGNOSIS — S78111D Complete traumatic amputation at level between right hip and knee, subsequent encounter: Secondary | ICD-10-CM | POA: Diagnosis not present

## 2018-10-01 DIAGNOSIS — I10 Essential (primary) hypertension: Secondary | ICD-10-CM | POA: Diagnosis not present

## 2018-10-01 DIAGNOSIS — I2583 Coronary atherosclerosis due to lipid rich plaque: Secondary | ICD-10-CM

## 2018-10-01 DIAGNOSIS — E1169 Type 2 diabetes mellitus with other specified complication: Secondary | ICD-10-CM | POA: Diagnosis not present

## 2018-10-01 DIAGNOSIS — M1712 Unilateral primary osteoarthritis, left knee: Secondary | ICD-10-CM | POA: Diagnosis not present

## 2018-10-01 DIAGNOSIS — M109 Gout, unspecified: Secondary | ICD-10-CM

## 2018-10-01 DIAGNOSIS — E119 Type 2 diabetes mellitus without complications: Secondary | ICD-10-CM

## 2018-10-01 MED ORDER — ROSUVASTATIN CALCIUM 40 MG PO TABS: 40.0000 mg | ORAL_TABLET | Freq: Every day | ORAL | 4 refills | Status: DC

## 2018-10-01 MED ORDER — GLIPIZIDE 5 MG PO TABS: 5.0000 mg | ORAL_TABLET | Freq: Every day | ORAL | 4 refills | Status: DC

## 2018-10-01 MED ORDER — ALLOPURINOL 300 MG PO TABS: 300.0000 mg | ORAL_TABLET | Freq: Every day | ORAL | 4 refills | Status: DC

## 2018-10-01 MED ORDER — METFORMIN HCL 500 MG PO TABS: 1000.0000 mg | ORAL_TABLET | Freq: Two times a day (BID) | ORAL | 4 refills | Status: DC

## 2018-10-01 MED ORDER — AMLODIPINE BESYLATE 10 MG PO TABS: 10.0000 mg | ORAL_TABLET | Freq: Every day | ORAL | 4 refills | Status: DC

## 2018-10-01 MED ORDER — LOSARTAN POTASSIUM 100 MG PO TABS: 100.0000 mg | ORAL_TABLET | Freq: Every day | ORAL | 4 refills | Status: DC

## 2018-10-01 NOTE — Assessment & Plan Note (Signed)
The current medical regimen is effective;  continue present plan and medications.  

## 2018-10-01 NOTE — Progress Notes (Signed)
There were no vitals taken for this visit.   Subjective:    Patient ID: Carl Stalled R Kropf, male    DOB: 01/15/1946, 73 y.o.   MRN: [id number]  HPI: Carl Bryan is a 73 y.o. male  Med check   Discussed with patient all in all doing well reviewed blood work from earlier this month which was essentially normal except for A1c elevated reviewed diabetes care and treatment with dietary measures.  Gave refills on medications and discussed care and treatment patient doing well with medications no complaints.  Relevant past medical, surgical, family and social history reviewed and updated as indicated. Interim medical history since our last visit reviewed. Allergies and medications reviewed and updated.  Review of Systems  Constitutional: Negative.   Respiratory: Negative.   Cardiovascular: Negative.     Per HPI unless specifically indicated above     Objective:    There were no vitals taken for this visit.  Wt Readings from Last 3 Encounters:  09/19/18 173 lb (78.5 kg)  09/13/18 172 lb (78 kg)  03/28/18 167 lb 4 oz (75.9 kg)    Physical Exam  Results for orders placed or performed in visit on 09/19/18  Microscopic Examination   URINE  Result Value Ref Range   WBC, UA None seen 0 - 5 /hpf   RBC 0-2 0 - 2 /hpf   Epithelial Cells (non renal) 0-10 0 - 10 /hpf   Bacteria, UA None seen None seen/Few  Bayer DCA Hb A1c Waived  Result Value Ref Range   HB A1C (BAYER DCA - WAIVED) 8.0 (H) <7.0 %  CBC with Differential/Platelet  Result Value Ref Range   WBC 7.4 3.4 - 10.8 x10E3/uL   RBC 4.60 4.14 - 5.80 x10E6/uL   Hemoglobin 15.1 13.0 - 17.7 g/dL   Hematocrit 40.943.6 81.137.5 - 51.0 %   MCV 95 79 - 97 fL   MCH 32.8 26.6 - 33.0 pg   MCHC 34.6 31.5 - 35.7 g/dL   RDW 91.413.2 78.211.6 - 95.615.4 %   Platelets 250 150 - 450 x10E3/uL   Neutrophils 63 Not Estab. %   Lymphs 26 Not Estab. %   Monocytes 7 Not Estab. %   Eos 3 Not Estab. %   Basos 1 Not Estab. %   Neutrophils Absolute 4.7 1.4 - 7.0 x10E3/uL    Lymphocytes Absolute 1.9 0.7 - 3.1 x10E3/uL   Monocytes Absolute 0.5 0.1 - 0.9 x10E3/uL   EOS (ABSOLUTE) 0.2 0.0 - 0.4 x10E3/uL   Basophils Absolute 0.1 0.0 - 0.2 x10E3/uL   Immature Granulocytes 0 Not Estab. %   Immature Grans (Abs) 0.0 0.0 - 0.1 x10E3/uL  Comprehensive metabolic panel  Result Value Ref Range   Glucose 282 (H) 65 - 99 mg/dL   BUN 18 8 - 27 mg/dL   Creatinine, Ser 2.131.17 0.76 - 1.27 mg/dL   GFR calc non Af Amer 61 >59 mL/min/1.73   GFR calc Af Amer 71 >59 mL/min/1.73   BUN/Creatinine Ratio 15 10 - 24   Sodium 139 134 - 144 mmol/L   Potassium 4.2 3.5 - 5.2 mmol/L   Chloride 104 96 - 106 mmol/L   CO2 21 20 - 29 mmol/L   Calcium 9.9 8.6 - 10.2 mg/dL   Total Protein 6.4 6.0 - 8.5 g/dL   Albumin 4.3 3.7 - 4.7 g/dL   Globulin, Total 2.1 1.5 - 4.5 g/dL   Albumin/Globulin Ratio 2.0 1.2 - 2.2   Bilirubin Total 0.5 0.0 -  1.2 mg/dL   Alkaline Phosphatase 85 39 - 117 IU/L   AST 13 0 - 40 IU/L   ALT 25 0 - 44 IU/L  Lipid Panel w/o Chol/HDL Ratio  Result Value Ref Range   Cholesterol, Total 109 100 - 199 mg/dL   Triglycerides 161155 (H) 0 - 149 mg/dL   HDL 36 (L) >09>39 mg/dL   VLDL Cholesterol Cal 31 5 - 40 mg/dL   LDL Calculated 42 0 - 99 mg/dL  Microalbumin, Urine Waived  Result Value Ref Range   Microalb, Ur Waived 150 (H) 0 - 19 mg/L   Creatinine, Urine Waived 100 10 - 300 mg/dL   Microalb/Creat Ratio >300 (H) <30 mg/g  PSA  Result Value Ref Range   Prostate Specific Ag, Serum 1.4 0.0 - 4.0 ng/mL  TSH  Result Value Ref Range   TSH 1.850 0.450 - 4.500 uIU/mL  UA/M w/rflx Culture, Routine   Specimen: Urine   URINE  Result Value Ref Range   Specific Gravity, UA 1.025 1.005 - 1.030   pH, UA 6.0 5.0 - 7.5   Color, UA Yellow Yellow   Appearance Ur Clear Clear   Leukocytes,UA Negative Negative   Protein,UA 2+ (A) Negative/Trace   Glucose, UA 3+ (A) Negative   Ketones, UA Trace (A) Negative   RBC, UA Trace (A) Negative   Bilirubin, UA Negative Negative    Urobilinogen, Ur 4.0 (H) 0.2 - 1.0 mg/dL   Nitrite, UA Negative Negative   Microscopic Examination See below:   Uric acid  Result Value Ref Range   Uric Acid 1.9 (L) 3.7 - 8.6 mg/dL      Assessment & Plan:   Problem List Items Addressed This Visit      Cardiovascular and Mediastinum   Essential (primary) hypertension    The current medical regimen is effective;  continue present plan and medications.       Relevant Medications   amLODipine (NORVASC) 10 MG tablet   losartan (COZAAR) 100 MG tablet   rosuvastatin (CRESTOR) 40 MG tablet   CAD (coronary artery disease)    The current medical regimen is effective;  continue present plan and medications.       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       Relevant Medications   metFORMIN (GLUCOPHAGE) 500 MG tablet   losartan (COZAAR) 100 MG tablet   rosuvastatin (CRESTOR) 40 MG tablet   glipiZIDE (GLUCOTROL) 5 MG tablet     Other   Pure hypercholesterolemia    The current medical regimen is effective;  continue present plan and medications.       Relevant Medications   amLODipine (NORVASC) 10 MG tablet   losartan (COZAAR) 100 MG tablet   rosuvastatin (CRESTOR) 40 MG tablet   Traumatic amputation of right leg above knee (HCC)    The current medical regimen is effective;  continue present plan and medications.       Gout   Relevant Medications   allopurinol (ZYLOPRIM) 300 MG tablet    Other Visit Diagnoses    Type 2 diabetes mellitus without complication, without long-term current use of insulin (HCC)       Relevant Medications   metFORMIN (GLUCOPHAGE) 500 MG tablet  losartan (COZAAR) 100 MG tablet   rosuvastatin (CRESTOR) 40 MG tablet   glipiZIDE (GLUCOTROL) 5  MG tablet      Telemedicine using audio/video telecommunications for a synchronous communication visit. Today's visit due to COVID-19 isolation precautions I connected with and verified that I am speaking with the correct person using two identifiers.   I discussed the limitations, risks, security and privacy concerns of performing an evaluation and management service by telecommunication and the availability of in person appointments. I also discussed with the patient that there may be a patient responsible charge related to this service. The patient expressed understanding and agreed to proceed. The patient's location is truck. I am at home.   I discussed the assessment and treatment plan with the patient. The patient was provided an opportunity to ask questions and all were answered. The patient agreed with the plan and demonstrated an understanding of the instructions.   The patient was advised to call back or seek an in-person evaluation if the symptoms worsen or if the condition fails to improve as anticipated.   I provided 21+ minutes of time during this encounter.  Follow up plan: Return in about 6 months (around 04/02/2019) for Hemoglobin A1c, BMP,  Lipids, ALT, AST.

## 2018-10-28 ENCOUNTER — Other Ambulatory Visit: Payer: Self-pay | Admitting: Family Medicine

## 2018-10-28 DIAGNOSIS — I10 Essential (primary) hypertension: Secondary | ICD-10-CM

## 2018-10-28 DIAGNOSIS — E7800 Pure hypercholesterolemia, unspecified: Secondary | ICD-10-CM

## 2018-11-08 ENCOUNTER — Telehealth: Payer: Self-pay | Admitting: Family Medicine

## 2018-11-08 NOTE — Telephone Encounter (Signed)
Patient's wife notified that the medication was sent over on 10/01/18.

## 2018-11-08 NOTE — Telephone Encounter (Signed)
Medication Refill - Medication: rosuvastatin (CRESTOR) 40 MG tablet and losartan (COZAAR) 100 MG tablet    Preferred Pharmacy (with phone number or street name):  [place], [place] [phone] (Phone) [phone] (Fax)

## 2018-11-20 ENCOUNTER — Emergency Department: Payer: Medicare HMO

## 2018-11-20 ENCOUNTER — Emergency Department
Admission: EM | Admit: 2018-11-20 | Discharge: 2018-11-20 | Disposition: A | Discharge status: Home / Self Care | Payer: Medicare HMO | Attending: Emergency Medicine | Admitting: Emergency Medicine

## 2018-11-20 ENCOUNTER — Other Ambulatory Visit: Payer: Self-pay

## 2018-11-20 ENCOUNTER — Encounter: Payer: Self-pay | Admitting: Emergency Medicine

## 2018-11-20 DIAGNOSIS — S72044A Nondisplaced fracture of base of neck of right femur, initial encounter for closed fracture: Secondary | ICD-10-CM | POA: Diagnosis not present

## 2018-11-20 DIAGNOSIS — Y999 Unspecified external cause status: Secondary | ICD-10-CM | POA: Insufficient documentation

## 2018-11-20 DIAGNOSIS — Z87891 Personal history of nicotine dependence: Secondary | ICD-10-CM | POA: Diagnosis not present

## 2018-11-20 DIAGNOSIS — E119 Type 2 diabetes mellitus without complications: Secondary | ICD-10-CM | POA: Insufficient documentation

## 2018-11-20 DIAGNOSIS — I10 Essential (primary) hypertension: Secondary | ICD-10-CM | POA: Insufficient documentation

## 2018-11-20 DIAGNOSIS — I2510 Atherosclerotic heart disease of native coronary artery without angina pectoris: Secondary | ICD-10-CM | POA: Insufficient documentation

## 2018-11-20 DIAGNOSIS — S199XXA Unspecified injury of neck, initial encounter: Secondary | ICD-10-CM | POA: Diagnosis not present

## 2018-11-20 DIAGNOSIS — Y929 Unspecified place or not applicable: Secondary | ICD-10-CM | POA: Diagnosis not present

## 2018-11-20 DIAGNOSIS — Z79899 Other long term (current) drug therapy: Secondary | ICD-10-CM | POA: Insufficient documentation

## 2018-11-20 DIAGNOSIS — S79911A Unspecified injury of right hip, initial encounter: Secondary | ICD-10-CM | POA: Diagnosis not present

## 2018-11-20 DIAGNOSIS — Z89611 Acquired absence of right leg above knee: Secondary | ICD-10-CM | POA: Diagnosis not present

## 2018-11-20 DIAGNOSIS — W010XXA Fall on same level from slipping, tripping and stumbling without subsequent striking against object, initial encounter: Secondary | ICD-10-CM | POA: Diagnosis not present

## 2018-11-20 DIAGNOSIS — M25551 Pain in right hip: Secondary | ICD-10-CM | POA: Diagnosis not present

## 2018-11-20 DIAGNOSIS — S72031A Displaced midcervical fracture of right femur, initial encounter for closed fracture: Secondary | ICD-10-CM | POA: Diagnosis not present

## 2018-11-20 DIAGNOSIS — Y939 Activity, unspecified: Secondary | ICD-10-CM | POA: Diagnosis not present

## 2018-11-20 DIAGNOSIS — Z7984 Long term (current) use of oral hypoglycemic drugs: Secondary | ICD-10-CM | POA: Diagnosis not present

## 2018-11-20 DIAGNOSIS — S0990XA Unspecified injury of head, initial encounter: Secondary | ICD-10-CM | POA: Diagnosis not present

## 2018-11-20 LAB — COMPREHENSIVE METABOLIC PANEL
ALT: 43 U/L (ref 0–44)
AST: 27 U/L (ref 15–41)
Albumin: 4.4 g/dL (ref 3.5–5.0)
Alkaline Phosphatase: 55 U/L (ref 38–126)
Anion gap: 10 (ref 5–15)
BUN: 22 mg/dL (ref 8–23)
CO2: 19 mmol/L — ABNORMAL LOW (ref 22–32)
Calcium: 10 mg/dL (ref 8.9–10.3)
Chloride: 108 mmol/L (ref 98–111)
Creatinine, Ser: 0.92 mg/dL (ref 0.61–1.24)
GFR calc Af Amer: >60 mL/min (ref >60)
GFR calc non Af Amer: >60 mL/min (ref >60)
Glucose, Bld: 203 mg/dL — ABNORMAL HIGH (ref 70–99)
Potassium: 4.2 mmol/L (ref 3.5–5.1)
Sodium: 137 mmol/L (ref 135–145)
Total Bilirubin: 1 mg/dL (ref 0.3–1.2)
Total Protein: 7.2 g/dL (ref 6.5–8.1)

## 2018-11-20 LAB — CBC WITH DIFFERENTIAL/PLATELET
Abs Immature Granulocytes: 0.08 10*3/uL — ABNORMAL HIGH (ref 0.00–0.07)
Basophils Absolute: 0 10*3/uL (ref 0.0–0.1)
Basophils Relative: 0 %
Eosinophils Absolute: 0 10*3/uL (ref 0.0–0.5)
Eosinophils Relative: 0 %
HCT: 44.7 % (ref 39.0–52.0)
Hemoglobin: 15.5 g/dL (ref 13.0–17.0)
Immature Granulocytes: 1 %
Lymphocytes Relative: 9 %
Lymphs Abs: 1.4 10*3/uL (ref 0.7–4.0)
MCH: 32.2 pg (ref 26.0–34.0)
MCHC: 34.7 g/dL (ref 30.0–36.0)
MCV: 92.9 fL (ref 80.0–100.0)
Monocytes Absolute: 1 10*3/uL (ref 0.1–1.0)
Monocytes Relative: 6 %
Neutro Abs: 13.2 10*3/uL — ABNORMAL HIGH (ref 1.7–7.7)
Neutrophils Relative %: 84 %
Platelets: 204 10*3/uL (ref 150–400)
RBC: 4.81 MIL/uL (ref 4.22–5.81)
RDW: 13.5 % (ref 11.5–15.5)
WBC: 15.8 10*3/uL — ABNORMAL HIGH (ref 4.0–10.5)
nRBC: 0 % (ref 0.0–0.2)

## 2018-11-20 MED ORDER — ONDANSETRON HCL 4 MG PO TABS: 4.0000 mg | ORAL_TABLET | Freq: Three times a day (TID) | ORAL | 0 refills | Status: DC | PRN

## 2018-11-20 MED ORDER — OXYCODONE-ACETAMINOPHEN 5-325 MG PO TABS
1.0000 | ORAL_TABLET | Freq: Once | ORAL | Status: AC
  Administered 2018-11-20: 1 via ORAL
  Filled 2018-11-20: qty 1

## 2018-11-20 MED ORDER — OXYCODONE-ACETAMINOPHEN 5-325 MG PO TABS: 1.0000 | ORAL_TABLET | Freq: Four times a day (QID) | ORAL | 0 refills | Status: AC | PRN

## 2018-11-20 MED ORDER — ONDANSETRON 4 MG PO TBDP
4.0000 mg | ORAL_TABLET | Freq: Once | ORAL | Status: AC
  Administered 2018-11-20: 4 mg via ORAL
  Filled 2018-11-20: qty 1

## 2018-11-20 NOTE — ED Notes (Signed)
Patient transported to X-ray 

## 2018-11-20 NOTE — ED Provider Notes (Signed)
[facility] Emergency Department Provider Note  ____________________________________________  Time seen: Approximately 6:27 PM  I have reviewed the triage vital signs and the nursing notes.   HISTORY  Chief Complaint Hip Pain    HPI Carl Bryan is a 73 y.o. male with a history of above-knee amputation of the right lower extremity, presents to the emergency department with concern for right proximal leg pain after a fall.  Patient states that fall occurred after he got tangled up in some grass.  He was wearing his prosthesis at the time.  Patient denies hitting his head or his neck.  Patient states that "my stump is killing me".  Patient has no abrasions or lacerations of the affected area.  He denies chest pain, chest tightness, shortness of breath or abdominal pain.  No other alleviating measures have been attempted.        Past Medical History:  Diagnosis Date  . Bleeding ulcer   . Bronchospasm   . Diabetes mellitus without complication (HCC)    Type 2  . Diverticulosis   . Pure hypercholesterolemia   . Traumatic amputation of leg above knee [facility])     Patient Active Problem List   Diagnosis Date Noted  . Wound of left ankle 09/10/2018  . Skin lesions 03/28/2018  . Deafness in right ear 03/28/2018  . Arthritis of knee, left 09/26/2017  . Advance care planning 08/03/2016  . Gout 07/06/2015  . Pure hypercholesterolemia 09/29/2014  . Diabetes mellitus associated with hormonal etiology ([place]) 09/29/2014  . Traumatic amputation of right leg above knee ([place]) 09/29/2014  . Essential (primary) hypertension 09/29/2014  . CAD (coronary artery disease) 09/29/2014  . Diverticulosis     Past Surgical History:  Procedure Laterality Date  . ABOVE KNEE LEG AMPUTATION  1971  . APPENDECTOMY    . KNEE SURGERY     x 2.    Prior to Admission medications   Medication Sig Start Date End Date Taking? Authorizing Provider  ACCU-CHEK AVIVA PLUS test strip Check  1 time a day 07/21/18   Guadalupe Maple, MD  ACCU-CHEK SOFTCLIX LANCETS lancets  04/22/15   [provider]  allopurinol (ZYLOPRIM) 300 MG tablet Take 1 tablet (300 mg total) by mouth daily. 10/01/18   Guadalupe Maple, MD  amLODipine (NORVASC) 10 MG tablet Take 1 tablet (10 mg total) by mouth daily. 10/01/18   Guadalupe Maple, MD  Blood Glucose Monitoring Suppl (ACCU-CHEK AVIVA PLUS) w/Device KIT Test 1 time daily 07/16/18   Guadalupe Maple, MD  cetirizine (ZYRTEC) 10 MG tablet Take 1 tablet (10 mg total) by mouth daily. 11/29/17   Volney American, PA-C  glipiZIDE (GLUCOTROL) 5 MG tablet Take 1 tablet (5 mg total) by mouth daily before breakfast. 10/01/18   Guadalupe Maple, MD  losartan (COZAAR) 100 MG tablet Take 1 tablet (100 mg total) by mouth daily. 10/01/18   Guadalupe Maple, MD  metFORMIN (GLUCOPHAGE) 500 MG tablet Take 2 tablets (1,000 mg total) by mouth 2 (two) times daily with a meal. 10/01/18   Crissman, Jeannette How, MD  mupirocin ointment (BACTROBAN) 2 % Place 1 application into the nose 2 (two) times daily. 09/10/18   Cannady, Henrine Screws T, NP  ondansetron (ZOFRAN) 4 MG tablet Take 1 tablet (4 mg total) by mouth every 8 (eight) hours as needed for nausea or vomiting. 11/20/18   Lannie Fields, PA-C  oxyCODONE-acetaminophen (PERCOCET/ROXICET) 5-325 MG tablet Take 1 tablet by mouth every  6 (six) hours as needed for up to 5 days. 11/20/18 11/25/18  Lannie Fields, PA-C  rosuvastatin (CRESTOR) 40 MG tablet Take 1 tablet (40 mg total) by mouth daily. 10/01/18   Guadalupe Maple, MD    Allergies Doxycycline calcium, Tussionex pennkinetic er [hydrocod polst-cpm polst er], and Vytorin [ezetimibe-simvastatin]  Family History  Problem Relation Age of Onset  . Cancer Mother        Colon cancer  . Lung disease Father     Social History Social History   Tobacco Use  . Smoking status: Former Smoker    Types: Cigarettes    Quit date: 09/29/1964    Years since quitting: 54.1  . Smokeless  tobacco: Never Used  Substance Use Topics  . Alcohol use: No    Alcohol/week: 0.0 standard drinks  . Drug use: No     Review of Systems  Constitutional: No fever/chills Eyes: No visual changes. No discharge ENT: No upper respiratory complaints. Cardiovascular: no chest pain. Respiratory: no cough. No SOB. Gastrointestinal: No abdominal pain.  No nausea, no vomiting.  No diarrhea.  No constipation. Genitourinary: Negative for dysuria. No hematuria Musculoskeletal: Patient has proximal right lower extremity pain  Skin: Negative for rash, abrasions, lacerations, ecchymosis. Neurological: Negative for headaches, focal weakness or numbness.   ____________________________________________   PHYSICAL EXAM:  VITAL SIGNS: ED Triage Vitals  Enc Vitals Group     BP 11/20/18 1812 (!) 167/89     Pulse Rate 11/20/18 1812 92     Resp 11/20/18 1812 18     Temp 11/20/18 1812 99 F (37.2 C)     Temp Source 11/20/18 1812 Oral     SpO2 11/20/18 1812 95 %     Weight 11/20/18 1525 172 lb (78 kg)     Height 11/20/18 1525 _0  (1.549 m)     Head Circumference --      Peak Flow --      Pain Score 11/20/18 1525 8     Pain Loc --      Pain Edu? --      Excl. in [place]? --      Constitutional: Alert and oriented. Well appearing and in no acute distress. Eyes: Conjunctivae are normal. PERRL. EOMI. Head: Atraumatic. ENT:      Nose: No congestion/rhinnorhea.      Mouth/Throat: Mucous membranes are moist.  Neck: No stridor.  Full range of motion.  No midline C-spine tenderness to palpation.  Cardiovascular: Normal rate, regular rhythm. Normal S1 and S2.  Good peripheral circulation. Respiratory: Normal respiratory effort without tachypnea or retractions. Lungs CTAB. Good air entry to the bases with no decreased or absent breath sounds. Gastrointestinal: Bowel sounds 4 quadrants. Soft and nontender to palpation. No guarding or rigidity. No palpable masses. No distention. No CVA  tenderness. Musculoskeletal: Full range of motion to all extremities. No gross deformities appreciated.  Patient has diffuse tenderness to palpation of proximal aspect of right lower extremity. Neurologic:  Normal speech and language. No gross focal neurologic deficits are appreciated.  Skin:  Skin is warm, dry and intact. No rash noted. Psychiatric: Mood and affect are normal. Speech and behavior are normal. Patient exhibits appropriate insight and judgement.   ____________________________________________   LABS (all labs ordered are listed, but only abnormal results are displayed)  Labs Reviewed  CBC WITH DIFFERENTIAL/PLATELET - Abnormal; Notable for the following components:      Result Value   WBC 15.8 (*)    Neutro Abs  13.2 (*)    Abs Immature Granulocytes 0.08 (*)    All other components within normal limits  COMPREHENSIVE METABOLIC PANEL - Abnormal; Notable for the following components:   CO2 19 (*)    Glucose, Bld 203 (*)    All other components within normal limits   ____________________________________________  EKG   ____________________________________________  RADIOLOGY I personally viewed and evaluated these images as part of my medical decision making, as well as reviewing the written report by the radiologist.  Ct Head Wo Contrast  Result Date: 11/20/2018 CLINICAL DATA:  Pt in via POV, reports fall today injuring right hip. Pt with AKA to right side; remains ambulatory with crutches.Cerebral hemorrhage suspected; C-spine fx, traumatic EXAM: CT HEAD WITHOUT CONTRAST CT CERVICAL SPINE WITHOUT CONTRAST TECHNIQUE: Multidetector CT imaging of the head and cervical spine was performed following the standard protocol without intravenous contrast. Multiplanar CT image reconstructions of the cervical spine were also generated. COMPARISON:  None. FINDINGS: CT HEAD FINDINGS Brain: No intracranial hemorrhage. No parenchymal contusion. No midline shift or mass effect. Basilar  cisterns are patent. No skull base fracture. No fluid in the paranasal sinuses or mastoid air cells. Orbits are normal. Five Vascular: No hyperdense vessel or unexpected calcification. Skull: Normal. Negative for fracture or focal lesion. Sinuses/Orbits: Paranasal sinuses and mastoid air cells are clear. Orbits are clear. Other: None. CT CERVICAL SPINE FINDINGS Alignment: Normal alignment of the cervical vertebral bodies. Skull base and vertebrae: Normal craniocervical junction. No loss of vertebral body height or disc height. Normal facet articulation. No evidence of fracture. Soft tissues and spinal canal: No prevertebral soft tissue swelling. No perispinal or epidural hematoma. Disc levels: Endplate osteophytosis most severe from C3 to C6. No acute loss vertebral body height or disc height. Upper chest: Clear Other: None IMPRESSION: 1. No acute intracranial trauma. 2. No cervical spine fracture. 3. Multilevel disc osteophytic disease. Electronically Signed   By: Suzy Bouchard M.D.   On: 11/20/2018 18:51   Ct Cervical Spine Wo Contrast  Result Date: 11/20/2018 CLINICAL DATA:  Pt in via POV, reports fall today injuring right hip. Pt with AKA to right side; remains ambulatory with crutches.Cerebral hemorrhage suspected; C-spine fx, traumatic EXAM: CT HEAD WITHOUT CONTRAST CT CERVICAL SPINE WITHOUT CONTRAST TECHNIQUE: Multidetector CT imaging of the head and cervical spine was performed following the standard protocol without intravenous contrast. Multiplanar CT image reconstructions of the cervical spine were also generated. COMPARISON:  None. FINDINGS: CT HEAD FINDINGS Brain: No intracranial hemorrhage. No parenchymal contusion. No midline shift or mass effect. Basilar cisterns are patent. No skull base fracture. No fluid in the paranasal sinuses or mastoid air cells. Orbits are normal. Five Vascular: No hyperdense vessel or unexpected calcification. Skull: Normal. Negative for fracture or focal lesion.  Sinuses/Orbits: Paranasal sinuses and mastoid air cells are clear. Orbits are clear. Other: None. CT CERVICAL SPINE FINDINGS Alignment: Normal alignment of the cervical vertebral bodies. Skull base and vertebrae: Normal craniocervical junction. No loss of vertebral body height or disc height. Normal facet articulation. No evidence of fracture. Soft tissues and spinal canal: No prevertebral soft tissue swelling. No perispinal or epidural hematoma. Disc levels: Endplate osteophytosis most severe from C3 to C6. No acute loss vertebral body height or disc height. Upper chest: Clear Other: None IMPRESSION: 1. No acute intracranial trauma. 2. No cervical spine fracture. 3. Multilevel disc osteophytic disease. Electronically Signed   By: Suzy Bouchard M.D.   On: 11/20/2018 18:51   Ct Hip Right Wo Contrast  Result Date: 11/20/2018 CLINICAL DATA:  Hip trauma, insufficient radiographs. Fall with right hip injury. History of right AKA, ambulatory with crutches EXAM: CT OF THE RIGHT HIP WITHOUT CONTRAST TECHNIQUE: Multidetector CT imaging of the right hip was performed according to the standard protocol. Multiplanar CT image reconstructions were also generated. COMPARISON:  Same day hip radiographs FINDINGS: Bones/Joint/Cartilage The bones are diffusely demineralized. There is a nondisplaced transcervical right femoral neck fracture. The femoral head remains normally located. Small right hip joint effusion is present. A mild synovial thickening. Ligaments Suboptimally assessed by CT. Muscles and Tendons Diffuse fatty infiltration of the right thigh musculature. No tendinous injury is discernible. Soft tissues Aside from in minimal swelling adjacent the fracture site, the soft tissues are otherwise grossly unremarkable. Included portions of the pelvis demonstrate borderline prostatomegaly and colonic diverticulosis. A left testicular hydrocele is noted. IMPRESSION: Nondisplaced transcervical right femoral fracture with  trace right hip joint effusion. Fatty atrophy of the musculature. Diffuse bone demineralization. Both findings may be seen following lower extremity amputation. Other incidental findings include atherosclerosis, diverticulosis, borderline prostatomegaly and a left testicular hydrocele. Electronically Signed   By: Lovena Le M.D.   On: 11/20/2018 18:48   Dg Hip Unilat  With Pelvis 2-3 Views Right  Result Date: 11/20/2018 CLINICAL DATA:  Fall and right hip pain EXAM: DG HIP (WITH OR WITHOUT PELVIS) 2-3V RIGHT COMPARISON:  None. FINDINGS: The patient is status post above-the-knee amputation. No fracture is seen at the right hip. There is osteoarthritis seen bilaterally the superior joint space loss and osteophyte formation. There is a probable healed fracture deformity of the left inferior pubic rami. There is diffuse osteopenia. Degenerative changes in the lower lumbar spine. IMPRESSION: No acute osseous abnormality. Electronically Signed   By: Prudencio Pair M.D.   On: 11/20/2018 16:34    ____________________________________________    PROCEDURES  Procedure(s) performed:    Procedures    Medications  oxyCODONE-acetaminophen (PERCOCET/ROXICET) 5-325 MG per tablet 1 tablet (1 tablet Oral Given 11/20/18 1814)  ondansetron (ZOFRAN-ODT) disintegrating tablet 4 mg (4 mg Oral Given 11/20/18 1814)     ____________________________________________   INITIAL IMPRESSION / ASSESSMENT AND PLAN / ED COURSE  Pertinent labs & imaging results that were available during my care of the patient were reviewed by me and considered in my medical decision making (see chart for details).  Review of the [place] CSRS was performed in accordance of the [place] prior to dispensing any controlled drugs.           Assessment and Plan:  Fall:  73 year old male presents to the emergency department after a mechanical, non-syncopal fall.  Patient was concerned about right proximal lower extremity pain.  Patient was  hypertensive at triage but vital signs were otherwise reassuring.  Patient had diffuse pain to palpation over proximal aspect of right lower extremity.  Differential diagnosis includes fracture, subdural hematoma, subarachnoid hemorrhage, C-spine fracture, symptomatic anemia...  CT head and CT cervical spine revealed no acute abnormality.  Initial x-rays of the right hip had a questionable area where fracture could be present.  Patient underwent CT right hip which revealed a transcervical femoral neck fracture.  Orthopedist on-call, Dr. Harlow Mares was consulted regarding patient's case.  Patient was made nonweightbearing and was advised to follow-up with Dr. Harlow Mares.  He was prescribed Percocet for pain.  Strict return precautions were given to return to the emergency department with new or worsening symptoms.  All patient questions were answered.    ____________________________________________  FINAL  CLINICAL IMPRESSION(S) / ED DIAGNOSES  Final diagnoses:  Closed transcervical fracture of right femur, initial encounter ([place])      NEW MEDICATIONS STARTED DURING THIS VISIT:  ED Discharge Orders         Ordered    oxyCODONE-acetaminophen (PERCOCET/ROXICET) 5-325 MG tablet  Every 6 hours PRN     11/20/18 1916    ondansetron (ZOFRAN) 4 MG tablet  Every 8 hours PRN     11/20/18 1916              This chart was dictated using voice recognition software/Dragon. Despite best efforts to proofread, errors can occur which can change the meaning. Any change was purely unintentional.    Lannie Fields, PA-C 11/20/18 Edwin Dada, MD 11/20/18 2040

## 2018-11-20 NOTE — ED Triage Notes (Signed)
Pt in via POV, reports fall today injuring right hip.  Pt with AKA to right side; remains ambulatory with crutches.  NAD noted at this time.

## 2018-11-21 DIAGNOSIS — S72024A Nondisplaced fracture of epiphysis (separation) (upper) of right femur, initial encounter for closed fracture: Secondary | ICD-10-CM | POA: Diagnosis not present

## 2018-12-17 DIAGNOSIS — M1712 Unilateral primary osteoarthritis, left knee: Secondary | ICD-10-CM | POA: Diagnosis not present

## 2018-12-17 DIAGNOSIS — S72001A Fracture of unspecified part of neck of right femur, initial encounter for closed fracture: Secondary | ICD-10-CM | POA: Diagnosis not present

## 2019-01-23 ENCOUNTER — Ambulatory Visit (INDEPENDENT_AMBULATORY_CARE_PROVIDER_SITE_OTHER): Payer: Medicare HMO

## 2019-01-23 ENCOUNTER — Other Ambulatory Visit: Payer: Self-pay

## 2019-01-23 DIAGNOSIS — Z23 Encounter for immunization: Secondary | ICD-10-CM | POA: Diagnosis not present

## 2019-03-05 ENCOUNTER — Inpatient Hospital Stay
Admission: EM | Admit: 2019-03-05 | Discharge: 2019-03-11 | DRG: 871 | Disposition: A | Discharge status: Home / Self Care | Payer: Medicare HMO | Attending: Internal Medicine | Admitting: Internal Medicine

## 2019-03-05 ENCOUNTER — Encounter: Payer: Self-pay | Admitting: Emergency Medicine

## 2019-03-05 ENCOUNTER — Emergency Department: Payer: Medicare HMO

## 2019-03-05 ENCOUNTER — Other Ambulatory Visit: Payer: Self-pay

## 2019-03-05 DIAGNOSIS — Z801 Family history of malignant neoplasm of trachea, bronchus and lung: Secondary | ICD-10-CM

## 2019-03-05 DIAGNOSIS — Z800 Family history of malignant neoplasm of digestive organs: Secondary | ICD-10-CM

## 2019-03-05 DIAGNOSIS — S78111D Complete traumatic amputation at level between right hip and knee, subsequent encounter: Secondary | ICD-10-CM

## 2019-03-05 DIAGNOSIS — S0990XA Unspecified injury of head, initial encounter: Secondary | ICD-10-CM | POA: Diagnosis not present

## 2019-03-05 DIAGNOSIS — W06XXXA Fall from bed, initial encounter: Secondary | ICD-10-CM | POA: Diagnosis present

## 2019-03-05 DIAGNOSIS — S78111A Complete traumatic amputation at level between right hip and knee, initial encounter: Secondary | ICD-10-CM | POA: Diagnosis present

## 2019-03-05 DIAGNOSIS — I10 Essential (primary) hypertension: Secondary | ICD-10-CM | POA: Diagnosis present

## 2019-03-05 DIAGNOSIS — A4150 Gram-negative sepsis, unspecified: Secondary | ICD-10-CM | POA: Diagnosis not present

## 2019-03-05 DIAGNOSIS — E669 Obesity, unspecified: Secondary | ICD-10-CM | POA: Diagnosis present

## 2019-03-05 DIAGNOSIS — A4151 Sepsis due to Escherichia coli [E. coli]: Principal | ICD-10-CM | POA: Diagnosis present

## 2019-03-05 DIAGNOSIS — R0600 Dyspnea, unspecified: Secondary | ICD-10-CM

## 2019-03-05 DIAGNOSIS — R079 Chest pain, unspecified: Secondary | ICD-10-CM | POA: Diagnosis not present

## 2019-03-05 DIAGNOSIS — K5790 Diverticulosis of intestine, part unspecified, without perforation or abscess without bleeding: Secondary | ICD-10-CM | POA: Diagnosis present

## 2019-03-05 DIAGNOSIS — E872 Acidosis: Secondary | ICD-10-CM | POA: Diagnosis not present

## 2019-03-05 DIAGNOSIS — Z9181 History of falling: Secondary | ICD-10-CM

## 2019-03-05 DIAGNOSIS — R0902 Hypoxemia: Secondary | ICD-10-CM

## 2019-03-05 DIAGNOSIS — Z87891 Personal history of nicotine dependence: Secondary | ICD-10-CM

## 2019-03-05 DIAGNOSIS — E119 Type 2 diabetes mellitus without complications: Secondary | ICD-10-CM | POA: Diagnosis present

## 2019-03-05 DIAGNOSIS — R140 Abdominal distension (gaseous): Secondary | ICD-10-CM | POA: Diagnosis not present

## 2019-03-05 DIAGNOSIS — E1169 Type 2 diabetes mellitus with other specified complication: Secondary | ICD-10-CM | POA: Diagnosis present

## 2019-03-05 DIAGNOSIS — E860 Dehydration: Secondary | ICD-10-CM | POA: Diagnosis present

## 2019-03-05 DIAGNOSIS — E876 Hypokalemia: Secondary | ICD-10-CM | POA: Diagnosis not present

## 2019-03-05 DIAGNOSIS — A419 Sepsis, unspecified organism: Secondary | ICD-10-CM

## 2019-03-05 DIAGNOSIS — M109 Gout, unspecified: Secondary | ICD-10-CM | POA: Diagnosis present

## 2019-03-05 DIAGNOSIS — R778 Other specified abnormalities of plasma proteins: Secondary | ICD-10-CM | POA: Diagnosis present

## 2019-03-05 DIAGNOSIS — E1159 Type 2 diabetes mellitus with other circulatory complications: Secondary | ICD-10-CM | POA: Diagnosis present

## 2019-03-05 DIAGNOSIS — N281 Cyst of kidney, acquired: Secondary | ICD-10-CM | POA: Diagnosis not present

## 2019-03-05 DIAGNOSIS — E785 Hyperlipidemia, unspecified: Secondary | ICD-10-CM | POA: Diagnosis present

## 2019-03-05 DIAGNOSIS — Y92019 Unspecified place in single-family (private) house as the place of occurrence of the external cause: Secondary | ICD-10-CM

## 2019-03-05 DIAGNOSIS — Z888 Allergy status to other drugs, medicaments and biological substances status: Secondary | ICD-10-CM | POA: Diagnosis not present

## 2019-03-05 DIAGNOSIS — R4182 Altered mental status, unspecified: Secondary | ICD-10-CM | POA: Diagnosis not present

## 2019-03-05 DIAGNOSIS — N390 Urinary tract infection, site not specified: Secondary | ICD-10-CM | POA: Diagnosis not present

## 2019-03-05 DIAGNOSIS — R296 Repeated falls: Secondary | ICD-10-CM | POA: Diagnosis not present

## 2019-03-05 DIAGNOSIS — I471 Supraventricular tachycardia: Secondary | ICD-10-CM | POA: Diagnosis not present

## 2019-03-05 DIAGNOSIS — E861 Hypovolemia: Secondary | ICD-10-CM | POA: Diagnosis not present

## 2019-03-05 DIAGNOSIS — I2510 Atherosclerotic heart disease of native coronary artery without angina pectoris: Secondary | ICD-10-CM | POA: Diagnosis not present

## 2019-03-05 DIAGNOSIS — Z79899 Other long term (current) drug therapy: Secondary | ICD-10-CM

## 2019-03-05 DIAGNOSIS — R0603 Acute respiratory distress: Secondary | ICD-10-CM

## 2019-03-05 DIAGNOSIS — R32 Unspecified urinary incontinence: Secondary | ICD-10-CM | POA: Diagnosis present

## 2019-03-05 DIAGNOSIS — E871 Hypo-osmolality and hyponatremia: Secondary | ICD-10-CM | POA: Diagnosis not present

## 2019-03-05 DIAGNOSIS — Z20828 Contact with and (suspected) exposure to other viral communicable diseases: Secondary | ICD-10-CM | POA: Diagnosis present

## 2019-03-05 DIAGNOSIS — K5730 Diverticulosis of large intestine without perforation or abscess without bleeding: Secondary | ICD-10-CM | POA: Diagnosis not present

## 2019-03-05 DIAGNOSIS — Z89611 Acquired absence of right leg above knee: Secondary | ICD-10-CM | POA: Diagnosis not present

## 2019-03-05 DIAGNOSIS — E7800 Pure hypercholesterolemia, unspecified: Secondary | ICD-10-CM

## 2019-03-05 DIAGNOSIS — R319 Hematuria, unspecified: Secondary | ICD-10-CM | POA: Diagnosis not present

## 2019-03-05 DIAGNOSIS — J9601 Acute respiratory failure with hypoxia: Secondary | ICD-10-CM | POA: Diagnosis not present

## 2019-03-05 DIAGNOSIS — I2583 Coronary atherosclerosis due to lipid rich plaque: Secondary | ICD-10-CM | POA: Diagnosis not present

## 2019-03-05 DIAGNOSIS — G9341 Metabolic encephalopathy: Secondary | ICD-10-CM

## 2019-03-05 DIAGNOSIS — R0602 Shortness of breath: Secondary | ICD-10-CM | POA: Diagnosis not present

## 2019-03-05 DIAGNOSIS — I248 Other forms of acute ischemic heart disease: Secondary | ICD-10-CM | POA: Diagnosis not present

## 2019-03-05 DIAGNOSIS — Z7984 Long term (current) use of oral hypoglycemic drugs: Secondary | ICD-10-CM

## 2019-03-05 DIAGNOSIS — R7989 Other specified abnormal findings of blood chemistry: Secondary | ICD-10-CM | POA: Diagnosis not present

## 2019-03-05 DIAGNOSIS — S199XXA Unspecified injury of neck, initial encounter: Secondary | ICD-10-CM | POA: Diagnosis not present

## 2019-03-05 DIAGNOSIS — Z9981 Dependence on supplemental oxygen: Secondary | ICD-10-CM

## 2019-03-05 DIAGNOSIS — M6282 Rhabdomyolysis: Secondary | ICD-10-CM | POA: Diagnosis not present

## 2019-03-05 DIAGNOSIS — Z6825 Body mass index (BMI) 25.0-25.9, adult: Secondary | ICD-10-CM

## 2019-03-05 DIAGNOSIS — R6520 Severe sepsis without septic shock: Secondary | ICD-10-CM | POA: Diagnosis not present

## 2019-03-05 DIAGNOSIS — R9431 Abnormal electrocardiogram [ECG] [EKG]: Secondary | ICD-10-CM | POA: Diagnosis not present

## 2019-03-05 DIAGNOSIS — R509 Fever, unspecified: Secondary | ICD-10-CM | POA: Diagnosis not present

## 2019-03-05 DIAGNOSIS — K4020 Bilateral inguinal hernia, without obstruction or gangrene, not specified as recurrent: Secondary | ICD-10-CM | POA: Diagnosis not present

## 2019-03-05 DIAGNOSIS — I152 Hypertension secondary to endocrine disorders: Secondary | ICD-10-CM | POA: Diagnosis present

## 2019-03-05 DIAGNOSIS — J9801 Acute bronchospasm: Secondary | ICD-10-CM | POA: Diagnosis not present

## 2019-03-05 DIAGNOSIS — N2589 Other disorders resulting from impaired renal tubular function: Secondary | ICD-10-CM | POA: Diagnosis present

## 2019-03-05 LAB — CBC WITH DIFFERENTIAL/PLATELET
Abs Immature Granulocytes: 0.15 10*3/uL — ABNORMAL HIGH (ref 0.00–0.07)
Basophils Absolute: 0 10*3/uL (ref 0.0–0.1)
Basophils Relative: 0 %
Eosinophils Absolute: 0 10*3/uL (ref 0.0–0.5)
Eosinophils Relative: 0 %
HCT: 44.6 % (ref 39.0–52.0)
Hemoglobin: 15.5 g/dL (ref 13.0–17.0)
Immature Granulocytes: 1 %
Lymphocytes Relative: 5 %
Lymphs Abs: 0.7 10*3/uL (ref 0.7–4.0)
MCH: 31.8 pg (ref 26.0–34.0)
MCHC: 34.8 g/dL (ref 30.0–36.0)
MCV: 91.4 fL (ref 80.0–100.0)
Monocytes Absolute: 0.3 10*3/uL (ref 0.1–1.0)
Monocytes Relative: 3 %
Neutro Abs: 12.3 10*3/uL — ABNORMAL HIGH (ref 1.7–7.7)
Neutrophils Relative %: 91 %
Platelets: 173 10*3/uL (ref 150–400)
RBC: 4.88 MIL/uL (ref 4.22–5.81)
RDW: 13.2 % (ref 11.5–15.5)
WBC: 13.6 10*3/uL — ABNORMAL HIGH (ref 4.0–10.5)
nRBC: 0 % (ref 0.0–0.2)

## 2019-03-05 LAB — COMPREHENSIVE METABOLIC PANEL
ALT: 24 U/L (ref 0–44)
AST: 31 U/L (ref 15–41)
Albumin: 4.1 g/dL (ref 3.5–5.0)
Alkaline Phosphatase: 104 U/L (ref 38–126)
Anion gap: 17 — ABNORMAL HIGH (ref 5–15)
BUN: 21 mg/dL (ref 8–23)
CO2: 15 mmol/L — ABNORMAL LOW (ref 22–32)
Calcium: 9.8 mg/dL (ref 8.9–10.3)
Chloride: 98 mmol/L (ref 98–111)
Creatinine, Ser: 1.19 mg/dL (ref 0.61–1.24)
GFR calc Af Amer: >60 mL/min (ref >60)
GFR calc non Af Amer: 60 mL/min — ABNORMAL LOW (ref >60)
Glucose, Bld: 337 mg/dL — ABNORMAL HIGH (ref 70–99)
Potassium: 3.5 mmol/L (ref 3.5–5.1)
Sodium: 130 mmol/L — ABNORMAL LOW (ref 135–145)
Total Bilirubin: 3 mg/dL — ABNORMAL HIGH (ref 0.3–1.2)
Total Protein: 7.7 g/dL (ref 6.5–8.1)

## 2019-03-05 LAB — CK: Total CK: 1141 U/L — ABNORMAL HIGH (ref 49–397)

## 2019-03-05 LAB — BLOOD GAS, ARTERIAL
Acid-base deficit: 2.3 mmol/L — ABNORMAL HIGH (ref 0.0–2.0)
Bicarbonate: 21.2 mmol/L (ref 20.0–28.0)
FIO2: 0.32
O2 Saturation: 98 %
Patient temperature: 37
pCO2 arterial: 32 mmHg (ref 32.0–48.0)
pH, Arterial: 7.43 (ref 7.350–7.450)
pO2, Arterial: 102 mmHg (ref 83.0–108.0)

## 2019-03-05 LAB — URINALYSIS, ROUTINE W REFLEX MICROSCOPIC
Bacteria, UA: NONE SEEN
Bilirubin Urine: NEGATIVE
Glucose, UA: >=500 mg/dL — AB
Ketones, ur: 20 mg/dL — AB
Nitrite: NEGATIVE
Protein, ur: >=300 mg/dL — AB
RBC / HPF: >50 RBC/hpf — ABNORMAL HIGH (ref 0–5)
Specific Gravity, Urine: 1.026 (ref 1.005–1.030)
Squamous Epithelial / HPF: NONE SEEN (ref 0–5)
WBC, UA: >50 WBC/hpf — ABNORMAL HIGH (ref 0–5)
pH: 5 (ref 5.0–8.0)

## 2019-03-05 LAB — BASIC METABOLIC PANEL
Anion gap: 10 (ref 5–15)
BUN: 22 mg/dL (ref 8–23)
CO2: 20 mmol/L — ABNORMAL LOW (ref 22–32)
Calcium: 9 mg/dL (ref 8.9–10.3)
Chloride: 101 mmol/L (ref 98–111)
Creatinine, Ser: 1.22 mg/dL (ref 0.61–1.24)
GFR calc Af Amer: >60 mL/min (ref >60)
GFR calc non Af Amer: 58 mL/min — ABNORMAL LOW (ref >60)
Glucose, Bld: 291 mg/dL — ABNORMAL HIGH (ref 70–99)
Potassium: 3.3 mmol/L — ABNORMAL LOW (ref 3.5–5.1)
Sodium: 131 mmol/L — ABNORMAL LOW (ref 135–145)

## 2019-03-05 LAB — PROTIME-INR
INR: 1.2 (ref 0.8–1.2)
Prothrombin Time: 14.9 seconds (ref 11.4–15.2)

## 2019-03-05 LAB — SODIUM, URINE, RANDOM: Sodium, Ur: 21 mmol/L

## 2019-03-05 LAB — LACTIC ACID, PLASMA
Lactic Acid, Venous: 3.6 mmol/L (ref 0.5–1.9)
Lactic Acid, Venous: 1.4 mmol/L (ref 0.5–1.9)

## 2019-03-05 LAB — POC SARS CORONAVIRUS 2 AG: SARS Coronavirus 2 Ag: NEGATIVE

## 2019-03-05 LAB — CREATININE, URINE, RANDOM: Creatinine, Urine: 82 mg/dL

## 2019-03-05 LAB — OSMOLALITY, URINE: Osmolality, Ur: 323 mOsm/kg (ref 300–900)

## 2019-03-05 LAB — APTT: aPTT: 31 seconds (ref 24–36)

## 2019-03-05 LAB — TROPONIN I (HIGH SENSITIVITY)
Troponin I (High Sensitivity): 57 ng/L — ABNORMAL HIGH (ref <18)
Troponin I (High Sensitivity): 85 ng/L — ABNORMAL HIGH (ref <18)
Troponin I (High Sensitivity): 76 ng/L — ABNORMAL HIGH (ref <18)

## 2019-03-05 LAB — GLUCOSE, CAPILLARY
Glucose-Capillary: 274 mg/dL — ABNORMAL HIGH (ref 70–99)
Glucose-Capillary: 303 mg/dL — ABNORMAL HIGH (ref 70–99)

## 2019-03-05 LAB — AMMONIA: Ammonia: 21 umol/L (ref 9–35)

## 2019-03-05 LAB — SARS CORONAVIRUS 2 BY RT PCR (HOSPITAL ORDER, PERFORMED IN ~~LOC~~ HOSPITAL LAB): SARS Coronavirus 2: NEGATIVE

## 2019-03-05 LAB — BETA-HYDROXYBUTYRIC ACID: Beta-Hydroxybutyric Acid: 2.53 mmol/L — ABNORMAL HIGH (ref 0.05–0.27)

## 2019-03-05 LAB — PROCALCITONIN: Procalcitonin: 2.98 ng/mL

## 2019-03-05 MED ORDER — ENOXAPARIN SODIUM 40 MG/0.4ML ~~LOC~~ SOLN
40.0000 mg | SUBCUTANEOUS | Status: DC
  Administered 2019-03-05 – 2019-03-06 (×2): 40 mg via SUBCUTANEOUS
  Filled 2019-03-05 (×2): qty 0.4

## 2019-03-05 MED ORDER — INSULIN ASPART 100 UNIT/ML ~~LOC~~ SOLN
0.0000 [IU] | SUBCUTANEOUS | Status: DC
  Administered 2019-03-05: 7 [IU] via SUBCUTANEOUS
  Administered 2019-03-06 (×2): 2 [IU] via SUBCUTANEOUS
  Administered 2019-03-06 – 2019-03-08 (×6): 1 [IU] via SUBCUTANEOUS
  Administered 2019-03-08: 2 [IU] via SUBCUTANEOUS
  Administered 2019-03-08: 1 [IU] via SUBCUTANEOUS
  Administered 2019-03-08: 21:00:00 3 [IU] via SUBCUTANEOUS
  Administered 2019-03-08: 2 [IU] via SUBCUTANEOUS
  Administered 2019-03-09: 3 [IU] via SUBCUTANEOUS
  Administered 2019-03-09: 18:00:00 2 [IU] via SUBCUTANEOUS
  Administered 2019-03-09: 04:00:00 1 [IU] via SUBCUTANEOUS
  Administered 2019-03-09 (×3): 2 [IU] via SUBCUTANEOUS
  Administered 2019-03-10 (×2): 3 [IU] via SUBCUTANEOUS
  Administered 2019-03-10: 2 [IU] via SUBCUTANEOUS
  Administered 2019-03-10: 5 [IU] via SUBCUTANEOUS
  Filled 2019-03-05 (×22): qty 1

## 2019-03-05 MED ORDER — ROSUVASTATIN CALCIUM 20 MG PO TABS
40.0000 mg | ORAL_TABLET | Freq: Every day | ORAL | Status: DC
  Administered 2019-03-06 – 2019-03-10 (×5): 40 mg via ORAL
  Filled 2019-03-05: qty 2
  Filled 2019-03-05: qty 4
  Filled 2019-03-05 (×3): qty 2

## 2019-03-05 MED ORDER — VANCOMYCIN HCL IN DEXTROSE 1-5 GM/200ML-% IV SOLN
1000.0000 mg | Freq: Once | INTRAVENOUS | Status: AC
  Administered 2019-03-05: 1000 mg via INTRAVENOUS
  Filled 2019-03-05: qty 200

## 2019-03-05 MED ORDER — ACETAMINOPHEN 650 MG RE SUPP
650.0000 mg | Freq: Four times a day (QID) | RECTAL | Status: DC | PRN
  Filled 2019-03-05: qty 1

## 2019-03-05 MED ORDER — ACETAMINOPHEN 325 MG PO TABS
650.0000 mg | ORAL_TABLET | Freq: Four times a day (QID) | ORAL | Status: DC | PRN
  Administered 2019-03-06 – 2019-03-08 (×2): 650 mg via ORAL
  Filled 2019-03-05 (×2): qty 2

## 2019-03-05 MED ORDER — HYDROCODONE-ACETAMINOPHEN 5-325 MG PO TABS
1.0000 | ORAL_TABLET | ORAL | Status: DC | PRN
  Administered 2019-03-07: 1 via ORAL
  Administered 2019-03-08 – 2019-03-11 (×5): 2 via ORAL
  Filled 2019-03-05: qty 2
  Filled 2019-03-05: qty 1
  Filled 2019-03-05 (×2): qty 2
  Filled 2019-03-05 (×2): qty 1
  Filled 2019-03-05: qty 2

## 2019-03-05 MED ORDER — CHLORHEXIDINE GLUCONATE CLOTH 2 % EX PADS
6.0000 | MEDICATED_PAD | Freq: Every day | CUTANEOUS | Status: DC
  Administered 2019-03-06 – 2019-03-08 (×3): 6 via TOPICAL
  Filled 2019-03-05: qty 6

## 2019-03-05 MED ORDER — ACETAMINOPHEN 500 MG PO TABS
1000.0000 mg | ORAL_TABLET | Freq: Once | ORAL | Status: AC
  Administered 2019-03-05: 1000 mg via ORAL
  Filled 2019-03-05: qty 2

## 2019-03-05 MED ORDER — SODIUM CHLORIDE 0.9 % IV SOLN
2.0000 g | Freq: Once | INTRAVENOUS | Status: AC
  Administered 2019-03-05: 2 g via INTRAVENOUS
  Filled 2019-03-05: qty 2

## 2019-03-05 MED ORDER — LACTATED RINGERS IV BOLUS
1000.0000 mL | Freq: Once | INTRAVENOUS | Status: AC
  Administered 2019-03-05: 1000 mL via INTRAVENOUS

## 2019-03-05 MED ORDER — SODIUM CHLORIDE 0.9 % IV SOLN
INTRAVENOUS | Status: AC
  Administered 2019-03-05: 23:00:00 via INTRAVENOUS

## 2019-03-05 MED ORDER — METRONIDAZOLE IN NACL 5-0.79 MG/ML-% IV SOLN
500.0000 mg | Freq: Once | INTRAVENOUS | Status: AC
  Administered 2019-03-05: 500 mg via INTRAVENOUS
  Filled 2019-03-05: qty 100

## 2019-03-05 MED ORDER — ONDANSETRON HCL 4 MG PO TABS: 4.0000 mg | ORAL_TABLET | Freq: Four times a day (QID) | ORAL | Status: DC | PRN

## 2019-03-05 MED ORDER — ONDANSETRON HCL 4 MG/2ML IJ SOLN: 4.0000 mg | Freq: Four times a day (QID) | INTRAMUSCULAR | Status: DC | PRN

## 2019-03-05 NOTE — Progress Notes (Signed)
PHARMACY -  BRIEF ANTIBIOTIC NOTE   Pharmacy has received consult(s) for Vancomycin and Cefepime from an ED provider.  The patient's profile has been reviewed for ht/wt/allergies/indication/available labs.    One time order(s) placed for Vancomycin 1g and Cefepime 2g x 1 dose each.  Further antibiotics/pharmacy consults should be ordered by admitting physician if indicated.                       Thank you, Pearla Dubonnet 03/05/2019  4:49 PM

## 2019-03-05 NOTE — ED Notes (Signed)
Dr Charna Archer notified lactic acid 3.6

## 2019-03-05 NOTE — Progress Notes (Signed)
CODE SEPSIS - PHARMACY COMMUNICATION  **Broad Spectrum Antibiotics should be administered within 1 hour of Sepsis diagnosis**  Time Code Sepsis Called/Page Received: 8786  Antibiotics Ordered: cefepime,vancomycin,flagyl  Time of 1st antibiotic administration: 7672  Additional action taken by pharmacy: none  If necessary, Name of Provider/Nurse Contacted: [place] Floyce Bujak ,PharmD Clinical Pharmacist  03/05/2019  5:50 PM

## 2019-03-05 NOTE — ED Notes (Addendum)
Pt lying in bed eyes open with equal unlabored RR on 3LNC with NAD at this time. Pt denies pain and reports feeling better. Pt clean and dry and st nothing is needed of staff at this time

## 2019-03-05 NOTE — ED Triage Notes (Signed)
Arrives to ED with wife who states patient has been falling at home.  Retrieved patient from the car. Patient wearing underwear, has been incontinent of urine.  Skin hot to touch.  AOx3.

## 2019-03-05 NOTE — ED Notes (Signed)
Attempted to call report. ICU to return call once RN assigned to pt . ED charge nurse aware

## 2019-03-05 NOTE — H&P (Signed)
[name] FVC:[id number] DOB: 12-26-1945 DOA: 03/05/2019     PCP: Volney American, PA-C   Outpatient Specialists:   NONE   Patient arrived to ER on 03/05/19 at 1632  Patient coming from:   With family    Chief Complaint:   urinary incontinence frequent falls HPI: Carl Bryan is a 73 y.o. male with medical history significant of dm2, HTN, HLD Status post traumatic amputation  right AKA  Presented with falls at home has been very weak has been having multiple falls for the past few days just prior to arrival to emergency department had another fall.  He has strike his bed well fell out of bed today.  No LOC no pain. No chest pain He used to smoke but not anymore   Wife states that he has not had any chest pain abdominal pain no nausea no vomiting unaware of any sick contacts.    Infectious risk factors:  Reports shortness of breath  severe fatigue   In  ER RAPID COVID TEST NEGATIVE   Lab Results  Component Value Date   Verdi NEGATIVE 03/05/2019     Regarding pertinent Chronic problems:    Hyperlipidemia -  on statins Crestor    HTN on Norvasc Cozaar    DM 2 -  Lab Results  Component Value Date   HGBA1C 8.0 (H) 09/19/2018    PO meds only,      While in ER: Febrile up to 101.2 Sepsis initiated in the emergency department .On cefepime and vancomycin Lactic acid came back at 3.6 Ua appears to show UTI CXR wnl  COVID negative CT head neck WNL Troponin elevated at 85   The following Work up has been ordered so far:  Orders Placed This Encounter  Procedures   Critical Care   Blood Culture (routine x 2)   Urine culture   SARS Coronavirus 2 by RT PCR (hospital order, performed in [place] hospital lab) Nasopharyngeal Nasopharyngeal Swab   DG Chest Port 1 View   CT Head Wo Contrast   CT Cervical Spine Wo Contrast   Lactic acid, plasma   Comprehensive metabolic panel   CBC WITH DIFFERENTIAL   APTT   Protime-INR    Urinalysis, Routine w reflex microscopic   Procalcitonin   Beta-hydroxybutyric acid   Diet NPO time specified   Cardiac monitoring   Refer to Sidebar Report: Sepsis Sidebar ED/IP   Document vital signs within 1-hour of fluid bolus completion and notify provider of bolus completion   Document height and weight   Insert peripheral IV x 2   Initiate Carrier Fluid Protocol   Initiate Code Sepsis ([facility] [phone]) Reason for Consult? tracking   pharmacy consult   Consult to hospitalist  ALL PATIENTS BEING ADMITTED/HAVING PROCEDURES NEED COVID-19 SCREENING   Airborne and Contact precautions   Pulse oximetry, continuous   POC SARS Coronavirus 2 Ag-ED - Nasal Swab (BD Veritor Kit)   POC SARS Coronavirus 2 Ag   EKG 12-Lead   ED EKG 12-Lead     Following Medications were ordered in ER: Medications  ceFEPIme (MAXIPIME) 2 g in sodium chloride 0.9 % 100 mL IVPB (0 g Intravenous Stopped 03/05/19 1749)  metroNIDAZOLE (FLAGYL) IVPB 500 mg (0 mg Intravenous Stopped 03/05/19 1956)  vancomycin (VANCOCIN) IVPB 1000 mg/200 mL premix (0 mg Intravenous Stopped 03/05/19 1956)  acetaminophen (TYLENOL) tablet 1,000 mg (1,000 mg Oral Given 03/05/19 1721)  lactated ringers bolus 1,000 mL (1,000 mLs Intravenous New  Bag/Given 03/05/19 1808)  lactated ringers bolus 1,000 mL (1,000 mLs Intravenous New Bag/Given 03/05/19 1719)        Consult Orders  (From admission, onward)         Start     Ordered   03/05/19 1924  Consult to hospitalist  ALL PATIENTS BEING ADMITTED/HAVING PROCEDURES NEED COVID-19 SCREENING  Once    Comments: ALL PATIENTS BEING ADMITTED/HAVING PROCEDURES NEED COVID-19 SCREENING  Provider:  (Not yet assigned)  Question Answer Comment  Place call to: [phone]   Reason for Consult Admit      03/05/19 1923           Significant initial  Findings: Abnormal Labs Reviewed  LACTIC ACID, PLASMA - Abnormal; Notable for the following components:      Result Value     Lactic Acid, Venous 3.6 (*)    All other components within normal limits  COMPREHENSIVE METABOLIC PANEL - Abnormal; Notable for the following components:   Sodium 130 (*)    CO2 15 (*)    Glucose, Bld 337 (*)    Total Bilirubin 3.0 (*)    GFR calc non Af Amer 60 (*)    Anion gap 17 (*)    All other components within normal limits  CBC WITH DIFFERENTIAL/PLATELET - Abnormal; Notable for the following components:   WBC 13.6 (*)    Neutro Abs 12.3 (*)    Abs Immature Granulocytes 0.15 (*)    All other components within normal limits  URINALYSIS, ROUTINE W REFLEX MICROSCOPIC - Abnormal; Notable for the following components:   Color, Urine AMBER (*)    APPearance CLOUDY (*)    Glucose, UA >=500 (*)    Hgb urine dipstick LARGE (*)    Ketones, ur 20 (*)    Protein, ur >=300 (*)    Leukocytes,Ua TRACE (*)    RBC / HPF >50 (*)    WBC, UA >50 (*)    All other components within normal limits  BETA-HYDROXYBUTYRIC ACID - Abnormal; Notable for the following components:   Beta-Hydroxybutyric Acid 2.53 (*)    All other components within normal limits  GLUCOSE, CAPILLARY - Abnormal; Notable for the following components:   Glucose-Capillary 274 (*)    All other components within normal limits  BLOOD GAS, ARTERIAL - Abnormal; Notable for the following components:   Acid-base deficit 2.3 (*)    All other components within normal limits  CK - Abnormal; Notable for the following components:   Total CK 1,141 (*)    All other components within normal limits  BASIC METABOLIC PANEL - Abnormal; Notable for the following components:   Sodium 131 (*)    Potassium 3.3 (*)    CO2 20 (*)    Glucose, Bld 291 (*)    GFR calc non Af Amer 58 (*)    All other components within normal limits  GLUCOSE, CAPILLARY - Abnormal; Notable for the following components:   Glucose-Capillary 303 (*)    All other components within normal limits  TROPONIN I (HIGH SENSITIVITY) - Abnormal; Notable for the following  components:   Troponin I (High Sensitivity) 57 (*)    All other components within normal limits  TROPONIN I (HIGH SENSITIVITY) - Abnormal; Notable for the following components:   Troponin I (High Sensitivity) 85 (*)    All other components within normal limits     Otherwise labs showing:    Recent Labs  Lab 03/05/19 1659 03/05/19 2159  NA 130* 131*  K 3.5 3.3*  CO2 15* 20*  GLUCOSE 337* 291*  BUN 21 22  CREATININE 1.19 1.22  CALCIUM 9.8 9.0    Cr   stable,    Lab Results  Component Value Date   CREATININE 1.19 03/05/2019   CREATININE 0.92 11/20/2018   CREATININE 1.17 09/19/2018    Recent Labs  Lab 03/05/19 1659  AST 31  ALT 24  ALKPHOS 104  BILITOT 3.0*  PROT 7.7  ALBUMIN 4.1   Lab Results  Component Value Date   CALCIUM 9.8 03/05/2019       WBC      Component Value Date/Time   WBC 13.6 (H) 03/05/2019 1659   ANC    Component Value Date/Time   NEUTROABS 12.3 (H) 03/05/2019 1659   NEUTROABS 4.7 09/19/2018 1618   ALC No components found for: LYMPHAB    Plt: Lab Results  Component Value Date   PLT 173 03/05/2019     Lactic Acid, Venous    Component Value Date/Time   LATICACIDVEN 1.4 03/05/2019 2220    Procalcitonin 2.98   COVID-19 Labs  No results for input(s): DDIMER, FERRITIN, LDH, CRP in the last 72 hours.  Lab Results  Component Value Date   SARSCOV2NAA NEGATIVE 03/05/2019    ABG    Component Value Date/Time   PHART 7.43 03/05/2019 2159   PCO2ART 32 03/05/2019 2159   PO2ART 102 03/05/2019 2159   HCO3 21.2 03/05/2019 2159   ACIDBASEDEF 2.3 (H) 03/05/2019 2159   O2SAT 98.0 03/05/2019 2159      HG/HCT  stable,       Component Value Date/Time   HGB 15.5 03/05/2019 1659   HGB 15.1 09/19/2018 1618   HCT 44.6 03/05/2019 1659   HCT 43.6 09/19/2018 1618     Recent Labs  Lab 03/05/19 2159  AMMONIA 21     Troponin 85  Cardiac Panel (last 3 results) Recent Labs    03/05/19 2159  CKTOTAL 1,141*       ECG:  Ordered Personally reviewed by me showing: HR :144 Rhythm:   Sinus tachycardia   St depressions multiple leads QTC 556  Repeat ECG ordered    DM  labs:  HbA1C: Recent Labs    03/28/18 1052 09/19/18 1617  HGBA1C 7.2* 8.0*       CBG (last 3)  Recent Labs    03/05/19 2029 03/05/19 2242  GLUCAP 274* 303*       UA elevated WBC   Urine analysis:    Component Value Date/Time   COLORURINE AMBER (A) 03/05/2019 1646   APPEARANCEUR CLOUDY (A) 03/05/2019 1646   APPEARANCEUR Clear 09/19/2018 1615   LABSPEC 1.026 03/05/2019 1646   PHURINE 5.0 03/05/2019 1646   GLUCOSEU >=500 (A) 03/05/2019 1646   HGBUR LARGE (A) 03/05/2019 [place] 03/05/2019 1646   BILIRUBINUR Negative 09/19/2018 1615   KETONESUR 20 (A) 03/05/2019 1646   PROTEINUR >=300 (A) 03/05/2019 1646   NITRITE NEGATIVE 03/05/2019 1646   LEUKOCYTESUR TRACE (A) 03/05/2019 1646       Ordered  CT HEAD   NON acute  CXR -  NON acute     ED Triage Vitals  Enc Vitals Group     BP 03/05/19 1633 (!) 145/100     Pulse Rate 03/05/19 1633 (!) 143     Resp 03/05/19 1633 20     Temp 03/05/19 1633 (!) 101.2 F (38.4 C)     Temp Source 03/05/19 1633 Oral  SpO2 03/05/19 1635 92 %     Weight 03/05/19 1951 172 lb (78 kg)     Height 03/05/19 1951 _0  (1.676 m)     Head Circumference --      Peak Flow --      Pain Score 03/05/19 1635 0     Pain Loc --      Pain Edu? --      Excl. in [employer]? --   TMAX(24)@       Latest  Blood pressure 112/68, pulse (!) 107, temperature 98.3 F (36.8 C), temperature source Oral, resp. rate (!) 26, height _1  (1.676 m), weight 78 kg, SpO2 95 %.     Hospitalist was called for admission for Sepsis   Review of Systems:    Pertinent positives include: falls  Constitutional:  No weight loss, night sweats, Fevers, chills, fatigue, weight loss  HEENT:  No headaches, Difficulty swallowing,Tooth/dental problems,Sore throat,  No sneezing, itching, ear ache, nasal  congestion, post nasal drip,  Cardio-vascular:  No chest pain, Orthopnea, PND, anasarca, dizziness, palpitations.no Bilateral lower extremity swelling  GI:  No heartburn, indigestion, abdominal pain, nausea, vomiting, diarrhea, change in bowel habits, loss of appetite, melena, blood in stool, hematemesis Resp:  no shortness of breath at rest. No dyspnea on exertion, No excess mucus, no productive cough, No non-productive cough, No coughing up of blood.No change in color of mucus.No wheezing. Skin:  no rash or lesions. No jaundice GU:  no dysuria, change in color of urine, no urgency or frequency. No straining to urinate.  No flank pain.  Musculoskeletal:  No joint pain or no joint swelling. No decreased range of motion. No back pain.  Psych:  No change in mood or affect. No depression or anxiety. No memory loss.  Neuro: no localizing neurological complaints, no tingling, no weakness, no double vision, no gait abnormality, no slurred speech, no confusion  All systems reviewed and apart from [place] all are negative  Past Medical History:   Past Medical History:  Diagnosis Date   Bleeding ulcer    Bronchospasm    Diabetes mellitus without complication ([place])    Type 2   Diverticulosis    Pure hypercholesterolemia    Traumatic amputation of leg above knee (HCC)        Past Surgical History:  Procedure Laterality Date   ABOVE KNEE LEG AMPUTATION  1971   APPENDECTOMY     KNEE SURGERY     x 2.    Social History:       reports that he quit smoking about 54 years ago. His smoking use included cigarettes. He has never used smokeless tobacco. He reports that he does not drink alcohol or use drugs.   Family History:   Family History  Problem Relation Age of Onset   Cancer Mother        Colon cancer   Lung disease Father     Allergies: Allergies  Allergen Reactions   Doxycycline Calcium Rash   Tussionex Pennkinetic Er [Hydrocod Polst-Cpm Polst Er] Itching    Vytorin [Ezetimibe-Simvastatin]     Sleepy     Prior to Admission medications   Medication Sig Start Date End Date Taking? Authorizing Provider  ACCU-CHEK AVIVA PLUS test strip Check 1 time a day 07/21/18   Guadalupe Maple, MD  ACCU-CHEK SOFTCLIX LANCETS lancets  04/22/15   [provider]  allopurinol (ZYLOPRIM) 300 MG tablet Take 1 tablet (300 mg total) by mouth daily. 10/01/18  Guadalupe Maple, MD  amLODipine (NORVASC) 10 MG tablet Take 1 tablet (10 mg total) by mouth daily. 10/01/18   Guadalupe Maple, MD  Blood Glucose Monitoring Suppl (ACCU-CHEK AVIVA PLUS) w/Device KIT Test 1 time daily 07/16/18   Guadalupe Maple, MD  cetirizine (ZYRTEC) 10 MG tablet Take 1 tablet (10 mg total) by mouth daily. 11/29/17   Volney American, PA-C  glipiZIDE (GLUCOTROL) 5 MG tablet Take 1 tablet (5 mg total) by mouth daily before breakfast. 10/01/18   Guadalupe Maple, MD  losartan (COZAAR) 100 MG tablet Take 1 tablet (100 mg total) by mouth daily. 10/01/18   Guadalupe Maple, MD  metFORMIN (GLUCOPHAGE) 500 MG tablet Take 2 tablets (1,000 mg total) by mouth 2 (two) times daily with a meal. 10/01/18   Crissman, Jeannette How, MD  mupirocin ointment (BACTROBAN) 2 % Place 1 application into the nose 2 (two) times daily. 09/10/18   Cannady, Henrine Screws T, NP  ondansetron (ZOFRAN) 4 MG tablet Take 1 tablet (4 mg total) by mouth every 8 (eight) hours as needed for nausea or vomiting. 11/20/18   Lannie Fields, PA-C  rosuvastatin (CRESTOR) 40 MG tablet Take 1 tablet (40 mg total) by mouth daily. 10/01/18   Guadalupe Maple, MD   Physical Exam: Blood pressure 112/68, pulse (!) 107, temperature 98.3 F (36.8 C), temperature source Oral, resp. rate (!) 26, height '5\' 6"'$  (1.676 m), weight 78 kg, SpO2 95 %. 1. General:  in No  Acute distress    Chronically ill acutely ill -appearing 2. Psychological: somnolent but  Oriented 3. Head/ENT:     Dry Mucous Membranes                          Head Non traumatic, neck supple                           Poor Dentition 4. SKIN:  decreased Skin turgor,  Skin clean Dry and intact no rash 5. Heart: Regular rate and rhythm no  Murmur, no Rub or gallop 6. Lungs:   no wheezes or crackles   7. Abdomen: Soft,  non-tender, Non distended bowel sounds present 8. Lower extremities: no clubbing, cyanosis, no  edema 9. Neurologically Grossly intact, moving all 4 extremities equally  10. MSK: Normal range of motion   All other LABS:     Recent Labs  Lab 03/05/19 1659  WBC 13.6*  NEUTROABS 12.3*  HGB 15.5  HCT 44.6  MCV 91.4  PLT 173     Recent Labs  Lab 03/05/19 1659  NA 130*  K 3.5  CL 98  CO2 15*  GLUCOSE 337*  BUN 21  CREATININE 1.19  CALCIUM 9.8     Recent Labs  Lab 03/05/19 1659  AST 31  ALT 24  ALKPHOS 104  BILITOT 3.0*  PROT 7.7  ALBUMIN 4.1       Cultures: No results found for: SDES, SPECREQUEST, CULT, REPTSTATUS   Radiological Exams on Admission: Ct Head Wo Contrast  Result Date: 03/05/2019 CLINICAL DATA:  Head trauma, multiple falls at home, incontinent, cervical spine trauma high clinical risk EXAM: CT HEAD WITHOUT CONTRAST CT CERVICAL SPINE WITHOUT CONTRAST TECHNIQUE: Multidetector CT imaging of the head and cervical spine was performed following the standard protocol without intravenous contrast. Multiplanar CT image reconstructions of the cervical spine were also generated. COMPARISON:  11/20/2018 FINDINGS: CT HEAD FINDINGS  Brain: Generalized atrophy. Normal ventricular morphology. No midline shift or mass effect. Small vessel chronic ischemic changes of deep cerebral white matter. Otherwise normal appearance of brain parenchyma. No intracranial hemorrhage, mass lesion or evidence of acute infarction. No extra-axial fluid collections. Vascular: No hyperdense vessels. Skull: Intact Sinuses/Orbits: Clear Other: N/A CT CERVICAL SPINE FINDINGS Alignment: Minimal retrolisthesis C4-C5. Remaining alignments normal Skull base and vertebrae: Osseous  demineralization. Multilevel facet degenerative changes. Multilevel disc space narrowing and endplate spur formation diffusely throughout cervical spine. Vertebral body heights maintained. No fracture, additional subluxation, or bone destruction. Minimal scattered motion artifacts. Soft tissues and spinal canal: Prevertebral soft tissues normal thickness. Disc levels: Endplate spurs encroach upon the anterior aspect of the spinal canal and thecal sac at multiple levels. Upper chest: Lung apices clear Other: N/A IMPRESSION: Atrophy with minimal small vessel chronic ischemic changes of deep cerebral white matter. No acute intracranial abnormalities. Degenerative disc and facet disease changes throughout cervical spine as above. No acute cervical spine abnormalities. Electronically Signed   By: Lavonia Dana M.D.   On: 03/05/2019 17:26   Ct Cervical Spine Wo Contrast  Result Date: 03/05/2019 CLINICAL DATA:  Head trauma, multiple falls at home, incontinent, cervical spine trauma high clinical risk EXAM: CT HEAD WITHOUT CONTRAST CT CERVICAL SPINE WITHOUT CONTRAST TECHNIQUE: Multidetector CT imaging of the head and cervical spine was performed following the standard protocol without intravenous contrast. Multiplanar CT image reconstructions of the cervical spine were also generated. COMPARISON:  11/20/2018 FINDINGS: CT HEAD FINDINGS Brain: Generalized atrophy. Normal ventricular morphology. No midline shift or mass effect. Small vessel chronic ischemic changes of deep cerebral white matter. Otherwise normal appearance of brain parenchyma. No intracranial hemorrhage, mass lesion or evidence of acute infarction. No extra-axial fluid collections. Vascular: No hyperdense vessels. Skull: Intact Sinuses/Orbits: Clear Other: N/A CT CERVICAL SPINE FINDINGS Alignment: Minimal retrolisthesis C4-C5. Remaining alignments normal Skull base and vertebrae: Osseous demineralization. Multilevel facet degenerative changes. Multilevel  disc space narrowing and endplate spur formation diffusely throughout cervical spine. Vertebral body heights maintained. No fracture, additional subluxation, or bone destruction. Minimal scattered motion artifacts. Soft tissues and spinal canal: Prevertebral soft tissues normal thickness. Disc levels: Endplate spurs encroach upon the anterior aspect of the spinal canal and thecal sac at multiple levels. Upper chest: Lung apices clear Other: N/A IMPRESSION: Atrophy with minimal small vessel chronic ischemic changes of deep cerebral white matter. No acute intracranial abnormalities. Degenerative disc and facet disease changes throughout cervical spine as above. No acute cervical spine abnormalities. Electronically Signed   By: Lavonia Dana M.D.   On: 03/05/2019 17:26   Dg Chest Port 1 View  Result Date: 03/05/2019 CLINICAL DATA:  Fever. EXAM: PORTABLE CHEST 1 VIEW COMPARISON:  Chest x-ray dated 06/22/2014 FINDINGS: The heart size and mediastinal contours are within normal limits. Both lungs are clear. No acute bone abnormality. Degenerative changes of both shoulders. IMPRESSION: No active disease in the chest. Electronically Signed   By: Lorriane Shire M.D.   On: 03/05/2019 17:31    Chart has been reviewed    Assessment/Plan   73 y.o. male with medical history significant of dm2, HTN, HLD Status post traumatic amputation  right AKA  Admitted for Sepsis  Present on Admission:  Sepsis ([place]) -  -SIRS criteria met with  elevated white blood cell count, tachycardia ,   fever.    With  evidence of end organ damage such as acute renal failure,  Hypoxia elevated lactic acid  encephalopathy -Most likely source being:  undetermined  - Obtain serial lactic acid and procalcitonin level.  - Initiate IV antibiotics   - await results of blood and urine culture  - Rehydrate aggressively Obtain viral panel   Pure hypercholesterolemia -   stable continue home medications, when able to tolerate   Diabetes  mellitus associated with hormonal etiology ([place]) -  - Order Sensitive   SSI   -  check TSH and HgA1C  - Hold by mouth medications     Traumatic amputation of right leg above knee (HCC) chronic and stable   Essential (primary) hypertension - given hypotension hold home medications for now   CAD (coronary artery disease) not endorsing any chest pain at that time.  Slightly elevated troponin likely demand ischemia in the setting of sepsis   Acute lower UTI versus pyuria-  initially felt to have possible UTI after further investigation appears to have more of a pyuria.  Await results of urine culture   Acute metabolic encephalopathy -   - most likely multifactorial secondary to combination of   infection   dehydration secondary to decreased by mouth intake,    - Will rehydrate   - treat underlining infection   - if no improvement may need further imaging to evaluate for CNS pathology pathology such as MRI of the brain  CT scan unremarkable Given frequent falls will have PT OT evaluate prior to discharge  - neurological exam appears to be nonfocal but patient unable to cooperate fully  No evidence of asterixis  - ABG unremarkable no evidence of hypercarbia    - no history of liver disease ammonia unremarkable   Elevated troponin -  -no chest pain no EKG changes in the setting of  likely due to demand ischemia and poor clearance, monitor on telemetry and cycle cardiac enzymes to trend.  if continues to rise will need further work-up Given abnormal EKG will repeat EKG  Will order cardiology consult   Rhabdomyolysis -rehydrate and follow CK, hemoglobin and urine likely related to  rhabdo   Hypokalemia -replace and check magnesium level   Hyponatremia most likely secondary to dehydration we will rehydrate and check  Abdominal distention - will start with KUB  Other plan as per orders.  DVT prophylaxis:    Lovenox     Code Status:  FULL CODE   as per patient   I had personally  discussed CODE STATUS with patient  Family Communication:   Family not at  Bedside    Disposition Plan:        To home once workup is complete and patient is stable                      Would benefit from PT/OT eval prior to DC  Ordered                   Swallow eval - SLP ordered                                        Consults called:  Ordered Cardiology consult through epic  Admission status:  ED Disposition    None         inpatient     Expect 2 midnight stay secondary to severity of patient's current illness including   hemodynamic instability despite optimal treatment (tachycardia  Hypotension  tachypnea  hypoxia,  )  and extensive comorbidities including:  DM2   CAD  Obesity   History of amputation   That are currently affecting medical management.   I expect  patient to be hospitalized for 2 midnights requiring inpatient medical care.  Patient is at high risk for adverse outcome (such as loss of life or disability) if not treated.  Indication for inpatient stay as follows:  Severe change from baseline regarding mental status Hemodynamic instability despite maximal medical therapy,    inability to maintain oral hydration    New or worsening hypoxia  Need for IV antibiotics, IV fluids      Level of care          SDU tele indefinitely please discontinue once patient no longer qualifies  Precautions:  NONE   No active isolations  PPE: Used by the provider:   P100  eye Goggles,  Gloves    Reznor Ferrando 03/05/2019, 9:55 PM    [facility]     after 2 AM please page floor coverage PA If 7AM-7PM, please contact the day team taking care of the patient using [email or web address]

## 2019-03-05 NOTE — ED Provider Notes (Signed)
[facility] Emergency Department Provider Note   ____________________________________________   First MD Initiated Contact with Patient 03/05/19 1644     (approximate)  I have reviewed the triage vital signs and the nursing notes.   HISTORY  Chief Complaint Multiple falls   HPI Carl Bryan is a 73 y.o. male with past medical history of hypertension and diabetes who presents to the ED for multiple falls.  Per wife, patient has been weaker than usual and having multiple falls over the past couple of days.  She states that he has been awake and alert but will seem to get very weak at times and fell to the ground.  One occasion occurred just prior to arrival, where patient seemed to wake up from a nap and then fall out of bed, striking his head.  He did not lose consciousness and he denies any pain currently.  They state he has not had any fevers at home and he denies any cough or shortness of breath.  He also denies any chest pain, abdominal pain, vomiting, or diarrhea.  They are not aware of any sick contacts.  He suffered a traumatic amputation of his right leg above the knee in the 42s and has not had any recent issues with this.        Past Medical History:  Diagnosis Date  . Bleeding ulcer   . Bronchospasm   . Diabetes mellitus without complication (HCC)    Type 2  . Diverticulosis   . Pure hypercholesterolemia   . Traumatic amputation of leg above knee [facility])     Patient Active Problem List   Diagnosis Date Noted  . Sepsis ([place]) 03/05/2019  . Wound of left ankle 09/10/2018  . Skin lesions 03/28/2018  . Deafness in right ear 03/28/2018  . Arthritis of knee, left 09/26/2017  . Advance care planning 08/03/2016  . Gout 07/06/2015  . Pure hypercholesterolemia 09/29/2014  . Diabetes mellitus associated with hormonal etiology ([place]) 09/29/2014  . Traumatic amputation of right leg above knee ([place]) 09/29/2014  . Essential (primary) hypertension  09/29/2014  . CAD (coronary artery disease) 09/29/2014  . Diverticulosis     Past Surgical History:  Procedure Laterality Date  . ABOVE KNEE LEG AMPUTATION  1971  . APPENDECTOMY    . KNEE SURGERY     x 2.    Prior to Admission medications   Medication Sig Start Date End Date Taking? Authorizing Provider  ACCU-CHEK AVIVA PLUS test strip Check 1 time a day 07/21/18   Guadalupe Maple, MD  ACCU-CHEK SOFTCLIX LANCETS lancets  04/22/15   [provider]  allopurinol (ZYLOPRIM) 300 MG tablet Take 1 tablet (300 mg total) by mouth daily. 10/01/18   Guadalupe Maple, MD  amLODipine (NORVASC) 10 MG tablet Take 1 tablet (10 mg total) by mouth daily. 10/01/18   Guadalupe Maple, MD  Blood Glucose Monitoring Suppl (ACCU-CHEK AVIVA PLUS) w/Device KIT Test 1 time daily 07/16/18   Guadalupe Maple, MD  cetirizine (ZYRTEC) 10 MG tablet Take 1 tablet (10 mg total) by mouth daily. 11/29/17   Volney American, PA-C  glipiZIDE (GLUCOTROL) 5 MG tablet Take 1 tablet (5 mg total) by mouth daily before breakfast. 10/01/18   Guadalupe Maple, MD  losartan (COZAAR) 100 MG tablet Take 1 tablet (100 mg total) by mouth daily. 10/01/18   Guadalupe Maple, MD  metFORMIN (GLUCOPHAGE) 500 MG tablet Take 2 tablets (1,000 mg total) by mouth 2 (  two) times daily with a meal. 10/01/18   Crissman, Jeannette How, MD  mupirocin ointment (BACTROBAN) 2 % Place 1 application into the nose 2 (two) times daily. 09/10/18   Cannady, Henrine Screws T, NP  ondansetron (ZOFRAN) 4 MG tablet Take 1 tablet (4 mg total) by mouth every 8 (eight) hours as needed for nausea or vomiting. 11/20/18   Lannie Fields, PA-C  rosuvastatin (CRESTOR) 40 MG tablet Take 1 tablet (40 mg total) by mouth daily. 10/01/18   Guadalupe Maple, MD    Allergies Doxycycline calcium, Tussionex pennkinetic er [hydrocod polst-cpm polst er], and Vytorin [ezetimibe-simvastatin]  Family History  Problem Relation Age of Onset  . Cancer Mother        Colon cancer  . Lung disease  Father     Social History Social History   Tobacco Use  . Smoking status: Former Smoker    Types: Cigarettes    Quit date: 09/29/1964    Years since quitting: 54.4  . Smokeless tobacco: Never Used  Substance Use Topics  . Alcohol use: No    Alcohol/week: 0.0 standard drinks  . Drug use: No    Review of Systems  Constitutional: No fever/chills.  Positive for generalized weakness. Eyes: No visual changes. ENT: No sore throat. Cardiovascular: Denies chest pain. Respiratory: Denies shortness of breath. Gastrointestinal: No abdominal pain.  No nausea, no vomiting.  No diarrhea.  No constipation. Genitourinary: Negative for dysuria. Musculoskeletal: Negative for back pain. Skin: Negative for rash. Neurological: Negative for headaches, focal weakness or numbness.  ____________________________________________   PHYSICAL EXAM:  VITAL SIGNS: ED Triage Vitals  Enc Vitals Group     BP 03/05/19 1633 (!) 145/100     Pulse Rate 03/05/19 1633 (!) 143     Resp 03/05/19 1633 20     Temp 03/05/19 1633 (!) 101.2 F (38.4 C)     Temp Source 03/05/19 1633 Oral     SpO2 03/05/19 1635 92 %     Weight --      Height --      Head Circumference --      Peak Flow --      Pain Score 03/05/19 1635 0     Pain Loc --      Pain Edu? --      Excl. in [place]? --     Constitutional: Alert and oriented. Eyes: Conjunctivae are normal. Head: Abrasion to right frontal scalp with no hematoma or step-off. Nose: No congestion/rhinnorhea. Mouth/Throat: Mucous membranes are moist. Neck: Normal ROM Cardiovascular: Tachycardic, regular rhythm. Grossly normal heart sounds. Respiratory: Tachypneic with no respiratory distress.  No retractions. Lungs CTAB. Gastrointestinal: Soft and nontender. No distention. Genitourinary: deferred Musculoskeletal: No lower extremity tenderness nor edema.  Old right above-knee amputation with site clean, dry, and intact. Neurologic:  Normal speech and language. No gross  focal neurologic deficits are appreciated. Skin:  Skin is warm, dry and intact. No rash noted. Psychiatric: Mood and affect are normal. Speech and behavior are normal.  ____________________________________________   LABS (all labs ordered are listed, but only abnormal results are displayed)  Labs Reviewed  LACTIC ACID, PLASMA - Abnormal; Notable for the following components:      Result Value   Lactic Acid, Venous 3.6 (*)    All other components within normal limits  COMPREHENSIVE METABOLIC PANEL - Abnormal; Notable for the following components:   Sodium 130 (*)    CO2 15 (*)    Glucose, Bld 337 (*)  Total Bilirubin 3.0 (*)    GFR calc non Af Amer 60 (*)    Anion gap 17 (*)    All other components within normal limits  CBC WITH DIFFERENTIAL/PLATELET - Abnormal; Notable for the following components:   WBC 13.6 (*)    Neutro Abs 12.3 (*)    Abs Immature Granulocytes 0.15 (*)    All other components within normal limits  URINALYSIS, ROUTINE W REFLEX MICROSCOPIC - Abnormal; Notable for the following components:   Color, Urine AMBER (*)    APPearance CLOUDY (*)    Glucose, UA >=500 (*)    Hgb urine dipstick LARGE (*)    Ketones, ur 20 (*)    Protein, ur >=300 (*)    Leukocytes,Ua TRACE (*)    RBC / HPF >50 (*)    WBC, UA >50 (*)    All other components within normal limits  BETA-HYDROXYBUTYRIC ACID - Abnormal; Notable for the following components:   Beta-Hydroxybutyric Acid 2.53 (*)    All other components within normal limits  GLUCOSE, CAPILLARY - Abnormal; Notable for the following components:   Glucose-Capillary 274 (*)    All other components within normal limits  TROPONIN I (HIGH SENSITIVITY) - Abnormal; Notable for the following components:   Troponin I (High Sensitivity) 57 (*)    All other components within normal limits  SARS CORONAVIRUS 2 BY RT PCR (HOSPITAL ORDER, [facility])  CULTURE, BLOOD (ROUTINE X 2)  CULTURE, BLOOD (ROUTINE X  2)  URINE CULTURE  APTT  PROTIME-INR  PROCALCITONIN  LACTIC ACID, PLASMA  POC SARS CORONAVIRUS 2 AG -  ED  POC SARS CORONAVIRUS 2 AG  TROPONIN I (HIGH SENSITIVITY)   ____________________________________________  EKG  ED ECG REPORT I, Blake Divine, the attending physician, personally viewed and interpreted this ECG.   Date: 03/05/2019  EKG Time: 16:37  Rate: 144  Rhythm: sinus tachycardia  Axis: Normal  Intervals:Prolonged QTc  ST&T Change: Diffuse ST depressions with ST elevation in aVR   PROCEDURES  Procedure(s) performed (including Critical Care):  .Critical Care Performed by: Blake Divine, MD Authorized by: Blake Divine, MD   Critical care provider statement:    Critical care time (minutes):  45   Critical care time was exclusive of:  Separately billable procedures and treating other patients and teaching time   Critical care was necessary to treat or prevent imminent or life-threatening deterioration of the following conditions:  Sepsis   Critical care was time spent personally by me on the following activities:  Discussions with consultants, evaluation of patient's response to treatment, examination of patient, ordering and performing treatments and interventions, ordering and review of laboratory studies, ordering and review of radiographic studies, pulse oximetry, re-evaluation of patient's condition, obtaining history from patient or surrogate and review of old charts     ____________________________________________   INITIAL IMPRESSION / [place] / ED COURSE       73 year old male presents to the ED for increasing weakness over the past 2 to 3 days with multiple recent falls, including 1 where he struck his head just prior to arrival.  Patient noted to be febrile, tachycardic, and tachypneic in triage, all concerning for sepsis.  Code sepsis called, will resuscitate with IV fluids and start broad-spectrum antibiotics as source is not  obvious at this time.  We will also image patient's head and neck given fall where he struck his head.  He has no obvious injury to his extremities and  there is no abdominal tenderness.  Chest x-ray negative for acute process however UA appears consistent with UTI, which is likely the source of his sepsis.  CT head and C-spine are negative for sequela of trauma.  Patient's heart rate gradually improving following second IV fluid bolus, blood pressure remained stable.  Patient does have hyperglycemia as well as slight anion gap and acidosis, beta hydroxybutyrate mildly elevated however lower suspicion for DKA as his glucose is improving with IV fluids.  Case discussed with hospitalist and plan made to hold off on insulin drip for now.      ____________________________________________   FINAL CLINICAL IMPRESSION(S) / ED DIAGNOSES  Final diagnoses:  Sepsis, due to unspecified organism, unspecified whether acute organ dysfunction present [facility])  Urinary tract infection with hematuria, site unspecified  Multiple falls     ED Discharge Orders    None       Note:  This document was prepared using Dragon voice recognition software and may include unintentional dictation errors.   Blake Divine, MD 03/05/19 2113

## 2019-03-05 NOTE — ED Notes (Signed)
Date and time results received: 03/05/19 9:17 PM  Test: Troponin  Critical Value: 49  Name of Provider Notified: Charna Archer

## 2019-03-05 NOTE — ED Notes (Signed)
Pt desatted to mid 50's while attempting to use urinal, placed pt on 2L [place]

## 2019-03-06 ENCOUNTER — Other Ambulatory Visit: Payer: Medicare HMO

## 2019-03-06 ENCOUNTER — Inpatient Hospital Stay: Payer: Medicare HMO

## 2019-03-06 ENCOUNTER — Inpatient Hospital Stay (HOSPITAL_COMMUNITY)
Admit: 2019-03-06 | Discharge: 2019-03-06 | Disposition: A | Discharge status: Home / Self Care | Payer: Medicare HMO | Attending: Internal Medicine | Admitting: Internal Medicine

## 2019-03-06 DIAGNOSIS — R778 Other specified abnormalities of plasma proteins: Secondary | ICD-10-CM

## 2019-03-06 DIAGNOSIS — I2583 Coronary atherosclerosis due to lipid rich plaque: Secondary | ICD-10-CM

## 2019-03-06 DIAGNOSIS — I2510 Atherosclerotic heart disease of native coronary artery without angina pectoris: Secondary | ICD-10-CM

## 2019-03-06 DIAGNOSIS — R296 Repeated falls: Secondary | ICD-10-CM

## 2019-03-06 DIAGNOSIS — R0902 Hypoxemia: Secondary | ICD-10-CM

## 2019-03-06 DIAGNOSIS — I10 Essential (primary) hypertension: Secondary | ICD-10-CM

## 2019-03-06 DIAGNOSIS — I471 Supraventricular tachycardia: Secondary | ICD-10-CM

## 2019-03-06 DIAGNOSIS — M6282 Rhabdomyolysis: Secondary | ICD-10-CM

## 2019-03-06 DIAGNOSIS — R319 Hematuria, unspecified: Secondary | ICD-10-CM

## 2019-03-06 DIAGNOSIS — A419 Sepsis, unspecified organism: Secondary | ICD-10-CM

## 2019-03-06 DIAGNOSIS — R9431 Abnormal electrocardiogram [ECG] [EKG]: Secondary | ICD-10-CM

## 2019-03-06 DIAGNOSIS — R6520 Severe sepsis without septic shock: Secondary | ICD-10-CM

## 2019-03-06 DIAGNOSIS — N390 Urinary tract infection, site not specified: Secondary | ICD-10-CM

## 2019-03-06 DIAGNOSIS — E1169 Type 2 diabetes mellitus with other specified complication: Secondary | ICD-10-CM

## 2019-03-06 DIAGNOSIS — A4150 Gram-negative sepsis, unspecified: Secondary | ICD-10-CM

## 2019-03-06 DIAGNOSIS — R079 Chest pain, unspecified: Secondary | ICD-10-CM

## 2019-03-06 LAB — BLOOD CULTURE ID PANEL (REFLEXED)

## 2019-03-06 LAB — GLUCOSE, CAPILLARY
Glucose-Capillary: 116 mg/dL — ABNORMAL HIGH (ref 70–99)
Glucose-Capillary: 121 mg/dL — ABNORMAL HIGH (ref 70–99)
Glucose-Capillary: 140 mg/dL — ABNORMAL HIGH (ref 70–99)
Glucose-Capillary: 161 mg/dL — ABNORMAL HIGH (ref 70–99)
Glucose-Capillary: 169 mg/dL — ABNORMAL HIGH (ref 70–99)

## 2019-03-06 LAB — CBC
HCT: 38.7 % — ABNORMAL LOW (ref 39.0–52.0)
Hemoglobin: 13.2 g/dL (ref 13.0–17.0)
MCH: 31.4 pg (ref 26.0–34.0)
MCHC: 34.1 g/dL (ref 30.0–36.0)
MCV: 91.9 fL (ref 80.0–100.0)
Platelets: 154 10*3/uL (ref 150–400)
RBC: 4.21 MIL/uL — ABNORMAL LOW (ref 4.22–5.81)
RDW: 13.4 % (ref 11.5–15.5)
WBC: 17.7 10*3/uL — ABNORMAL HIGH (ref 4.0–10.5)
nRBC: 0 % (ref 0.0–0.2)

## 2019-03-06 LAB — CK: Total CK: 725 U/L — ABNORMAL HIGH (ref 49–397)

## 2019-03-06 LAB — COMPREHENSIVE METABOLIC PANEL
ALT: 22 U/L (ref 0–44)
AST: 31 U/L (ref 15–41)
Albumin: 3 g/dL — ABNORMAL LOW (ref 3.5–5.0)
Alkaline Phosphatase: 54 U/L (ref 38–126)
Anion gap: 9 (ref 5–15)
BUN: 25 mg/dL — ABNORMAL HIGH (ref 8–23)
CO2: 21 mmol/L — ABNORMAL LOW (ref 22–32)
Calcium: 9 mg/dL (ref 8.9–10.3)
Chloride: 105 mmol/L (ref 98–111)
Creatinine, Ser: 1.24 mg/dL (ref 0.61–1.24)
GFR calc Af Amer: >60 mL/min (ref >60)
GFR calc non Af Amer: 57 mL/min — ABNORMAL LOW (ref >60)
Glucose, Bld: 168 mg/dL — ABNORMAL HIGH (ref 70–99)
Potassium: 3.5 mmol/L (ref 3.5–5.1)
Sodium: 135 mmol/L (ref 135–145)
Total Bilirubin: 1.5 mg/dL — ABNORMAL HIGH (ref 0.3–1.2)
Total Protein: 6.1 g/dL — ABNORMAL LOW (ref 6.5–8.1)

## 2019-03-06 LAB — TROPONIN I (HIGH SENSITIVITY): Troponin I (High Sensitivity): 74 ng/L — ABNORMAL HIGH (ref <18)

## 2019-03-06 LAB — MRSA PCR SCREENING: MRSA by PCR: NEGATIVE

## 2019-03-06 LAB — HEMOGLOBIN A1C
Hgb A1c MFr Bld: 8 % — ABNORMAL HIGH (ref 4.8–5.6)
Mean Plasma Glucose: 182.9 mg/dL

## 2019-03-06 LAB — ECHOCARDIOGRAM COMPLETE
Height: 66 in
Weight: 2511.48 oz

## 2019-03-06 LAB — PHOSPHORUS: Phosphorus: 3.1 mg/dL (ref 2.5–4.6)

## 2019-03-06 LAB — MAGNESIUM: Magnesium: 2 mg/dL (ref 1.7–2.4)

## 2019-03-06 LAB — TSH: TSH: 1.393 u[IU]/mL (ref 0.350–4.500)

## 2019-03-06 MED ORDER — DILTIAZEM HCL 100 MG IV SOLR
INTRAVENOUS | Status: AC
  Administered 2019-03-06: 5 mg/h via INTRAVENOUS
  Filled 2019-03-06: qty 100

## 2019-03-06 MED ORDER — LEVALBUTEROL HCL 0.63 MG/3ML IN NEBU
INHALATION_SOLUTION | RESPIRATORY_TRACT | Status: AC
  Administered 2019-03-06: 0.63 mg via RESPIRATORY_TRACT
  Filled 2019-03-06: qty 3

## 2019-03-06 MED ORDER — DILTIAZEM HCL 25 MG/5ML IV SOLN
5.0000 mg | Freq: Once | INTRAVENOUS | Status: AC
  Administered 2019-03-06: 10:00:00 5 mg via INTRAVENOUS

## 2019-03-06 MED ORDER — SODIUM CHLORIDE 0.9 % IV SOLN
2.0000 g | Freq: Two times a day (BID) | INTRAVENOUS | Status: DC
  Filled 2019-03-06: qty 2

## 2019-03-06 MED ORDER — VANCOMYCIN HCL 1.25 G IV SOLR
1250.0000 mg | INTRAVENOUS | Status: DC
  Filled 2019-03-06: qty 1250

## 2019-03-06 MED ORDER — LACTATED RINGERS IV BOLUS
500.0000 mL | Freq: Once | INTRAVENOUS | Status: AC
  Administered 2019-03-06: 500 mL via INTRAVENOUS

## 2019-03-06 MED ORDER — SODIUM CHLORIDE 0.9 % IV SOLN
1.0000 g | Freq: Two times a day (BID) | INTRAVENOUS | Status: DC
  Administered 2019-03-06 – 2019-03-07 (×3): 1 g via INTRAVENOUS
  Filled 2019-03-06 (×5): qty 1

## 2019-03-06 MED ORDER — DILTIAZEM HCL 100 MG IV SOLR
5.0000 mg/h | INTRAVENOUS | Status: DC
  Administered 2019-03-06: 10:00:00 5 mg/h via INTRAVENOUS

## 2019-03-06 MED ORDER — LACTATED RINGERS IV BOLUS
1000.0000 mL | Freq: Once | INTRAVENOUS | Status: AC
  Administered 2019-03-06: 1000 mL via INTRAVENOUS

## 2019-03-06 MED ORDER — LEVALBUTEROL HCL 0.63 MG/3ML IN NEBU
0.6300 mg | INHALATION_SOLUTION | Freq: Once | RESPIRATORY_TRACT | Status: AC
  Administered 2019-03-06: 10:00:00 0.63 mg via RESPIRATORY_TRACT

## 2019-03-06 MED ORDER — ACETAMINOPHEN 10 MG/ML IV SOLN
1000.0000 mg | Freq: Four times a day (QID) | INTRAVENOUS | Status: DC
  Administered 2019-03-06: 1000 mg via INTRAVENOUS
  Filled 2019-03-06 (×2): qty 100

## 2019-03-06 MED ORDER — DILTIAZEM HCL 25 MG/5ML IV SOLN
INTRAVENOUS | Status: AC
  Administered 2019-03-06: 5 mg via INTRAVENOUS
  Filled 2019-03-06: qty 5

## 2019-03-06 NOTE — Progress Notes (Addendum)
PROGRESS NOTE    Carl Bryan  OVZ:[id number] DOB: [birth year] DOA: 03/05/2019 PCP: Volney American, PA-C   Brief Narrative:  Carl Bryan is a 73 y.o. male with medical history significant of dm2, HTN, HLD Status post traumatic amputation  right AKA Presented with falls at home has been very weak has been having multiple falls for the past few days. On arrival to ED he met sepsis criteria, febrile at 101, tachycardic, tachypneic and hypotensive.  He was initially started on cefepime and vancomycin and was given IV fluid according to sepsis protocol. Urine and blood cultures are growing E. Coli-antibiotics switched to meropenem.  Subjective: Patient was feeling better when seen during morning rounds.  He was asking when he can go home. Later received a call from nurse that patient developed fever with chills and heart rate going up in 160s.  He was hypoxic and his nasal cannula was displaced.  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  Both urine and blood cultures are growing E. Coli. Initial improvement in labs which includes lactic acid and electrolytes. Procalcitonin elevated at 2.98. Patient was switched to meropenem according to blood culture reports-pending sensitivity. He has another episode of bacteremia which resulted in hypoxia and tachycardia.  Dr. Patsey Berthold from PCCM was managing when I arrived at floor after getting a call from nurse.  He was started on Cardizem drip by PCCM and was given Tylenol.  Saturation improves with nasal cannula. Repeat chest x-ray was within normal limit. -Heart rate improves-we  will stop Cardizem drip as blood pressure is soft. -Give him another bolus of LR. -Continue meropenem. -Continue monitoring.  Elevated troponin.  History of CAD.  Most likely demand.  No chest pain.  Diabetes.  Uncontrolled as A1c is eight. -Continue CBG monitoring with SSI.  Rhabdomyolysis.  Most likely due to fever.  CK improving -Continue IV hydration and monitor.  Acute encephalopathy.  Most likely metabolic secondary to bacteremia.  Resolved and patient was alert and oriented.  Hypertension.  Currently softer blood pressure. -Keep holding home meds.  Traumatic amputation of right leg above knee ([place]). Chronic and stable.  Electrolyte abnormalities.  Secondary to sepsis.  Resolved with repletion. -Continue to monitor-replete as needed.  Dyslipidemia. -Continue home meds.  Objective: Vitals:   03/06/19 1000 03/06/19 1038 03/06/19 1100 03/06/19 1319  BP: (!) 143/93 123/66 119/72 96/64  Pulse: (!) 126 (!) 116 (!) 105 94  Resp: (!) 27 17 (!) 23 19  Temp: (!) 102.7 F (39.3 C) (!) 102.6 F (39.2 C) (!) 101.8 F (38.8 C) 99.5 F (37.5 C)  TempSrc: Oral Oral Oral Oral  SpO2: (!) 88% 93% 93% 94%  Weight:      Height:        Intake/Output Summary (Last 24 hours) at 03/06/2019 1433 Last data filed at 03/06/2019 1041 Gross per 24 hour  Intake 3096.18 ml  Output 745 ml  Net 2351.18 ml   Filed Weights   03/05/19 1951 03/05/19 2350  Weight: 78 kg 71.2 kg    Examination:  General exam: Appears calm and comfortable.  Respiratory system: Clear  to auscultation. Respiratory effort normal. Cardiovascular system: S1 & S2 heard, RRR. No JVD, murmurs, rubs, gallops or clicks. No pedal edema. Gastrointestinal system: Abdomen is nondistended, soft and nontender. No organomegaly or masses felt. Normal bowel sounds heard. Central nervous system: Alert and oriented. No focal neurological deficits. Extremities: Symmetric 5 x 5 power. Skin: No rashes, lesions or  ulcers Psychiatry: Judgement and insight appear normal. Mood & affect appropriate.   DVT prophylaxis: Lovenox Code Status: Full Family Communication: Wife was updated on phone. Disposition Plan: Pending improvement.  Consultants:   Procedures:   Antimicrobials:  Meropenem.  Started on 11/26  Data Reviewed: I have personally reviewed following labs and imaging studies  CBC: Recent Labs  Lab 03/05/19 1659 03/06/19 0419  WBC 13.6* 17.7*  NEUTROABS 12.3*  --   HGB 15.5 13.2  HCT 44.6 38.7*  MCV 91.4 91.9  PLT 173 570   Basic Metabolic Panel: Recent Labs  Lab 03/05/19 1659 03/05/19 2159 03/06/19 0419  NA 130* 131* 135  K 3.5 3.3* 3.5  CL 98 101 105  CO2 15* 20* 21*  GLUCOSE 337* 291* 168*  BUN 21 22 25*  CREATININE 1.19 1.22 1.24  CALCIUM 9.8 9.0 9.0  MG  --   --  2.0  PHOS  --   --  3.1   GFR: Estimated Creatinine Clearance: 47.2 mL/min (by C-G formula based on SCr of 1.24 mg/dL). Liver Function Tests: Recent Labs  Lab 03/05/19 1659 03/06/19 0419  AST 31 31  ALT 24 22  ALKPHOS 104 54  BILITOT 3.0* 1.5*  PROT 7.7 6.1*  ALBUMIN 4.1 3.0*   No results for input(s): LIPASE, AMYLASE in the last 168 hours. Recent Labs  Lab 03/05/19 2159  AMMONIA 21   Coagulation Profile: Recent Labs  Lab 03/05/19 1659  INR 1.2   Cardiac Enzymes: Recent Labs  Lab 03/05/19 2159 03/06/19 0419  CKTOTAL 1,141* 725*   BNP (last 3 results) No results for input(s): PROBNP in the last 8760 hours. HbA1C: Recent Labs    03/06/19 0419  HGBA1C 8.0*   CBG: Recent Labs  Lab 03/05/19 2029 03/05/19 2242 03/06/19 0418 03/06/19 0743 03/06/19 1128  GLUCAP 274* 303* 169* 121* 140*   Lipid Profile: No results for input(s): CHOL, HDL, LDLCALC, TRIG, CHOLHDL, LDLDIRECT in the last 72 hours. Thyroid Function Tests: Recent Labs    03/06/19 0419  TSH 1.393   Anemia Panel: No results for input(s): VITAMINB12, FOLATE, FERRITIN, TIBC, IRON, RETICCTPCT in the last 72  hours. Sepsis Labs: Recent Labs  Lab 03/05/19 1659 03/05/19 2220  PROCALCITON 2.98  --   LATICACIDVEN 3.6* 1.4    Recent Results (from the past 240 hour(s))  Urine culture     Status: Abnormal (Preliminary result)   Collection Time: 03/05/19  4:46 PM   Specimen: In/Out Cath Urine  Result Value Ref Range Status   Specimen Description   Final    IN/OUT CATH URINE Performed at [facility], [address] [place], [place] [address]    Special Requests   Final    NONE Performed at [facility], [address] [place], [place] [address]    Culture (A)  Final    40,000 COLONIES/mL ESCHERICHIA COLI SUSCEPTIBILITIES TO FOLLOW Performed at [facility], [place] [address] [place], [place] [address]    Report Status PENDING  Incomplete  Blood Culture (routine x 2)     Status: None (Preliminary result)   Collection Time: 03/05/19  5:00 PM   Specimen: BLOOD  Result Value Ref Range Status   Specimen Description   Final    BLOOD BLOOD RIGHT HAND Performed at [facility], [place] [address], [place], [place] [address]    Special Requests   Final    BOTTLES DRAWN AEROBIC AND ANAEROBIC Blood Culture adequate volume Performed at [facility], [place] [address] [place], [place] [address]    Culture  Setup Time   Final    GRAM NEGATIVE RODS IN BOTH AEROBIC AND ANAEROBIC BOTTLES CRITICAL VALUE NOTED.  VALUE IS CONSISTENT WITH PREVIOUSLY REPORTED AND CALLED VALUE. Performed at [facility], [address], [place], [place] [address]    Culture   Final    CULTURE REINCUBATED FOR BETTER GROWTH Performed at [facility], [place] [address] [place], [place] [address]    Report Status PENDING  Incomplete  Blood Culture (routine x 2)     Status: None (Preliminary result)   Collection Time: 03/05/19  5:02 PM   Specimen: BLOOD LEFT FOREARM  Result Value Ref Range Status   Specimen Description BLOOD LEFT FOREARM  Final   Special Requests   Final     BOTTLES DRAWN AEROBIC AND ANAEROBIC Blood Culture adequate volume   Culture  Setup Time   Final    GRAM NEGATIVE RODS IN BOTH AEROBIC AND ANAEROBIC BOTTLES CRITICAL RESULT CALLED TO, READ BACK BY AND VERIFIED WITH: Hart Robinsons PHARMD [id number] 03/06/2019 HNM    Culture   Final    CULTURE REINCUBATED FOR BETTER GROWTH Performed at [facility], [place] [address], [place], [place] [address]    Report Status PENDING  Incomplete  Blood Culture ID Panel (Reflexed)     Status: Abnormal   Collection Time: 03/05/19  5:02 PM  Result Value Ref Range Status   Enterococcus species NOT DETECTED NOT DETECTED Final   Listeria monocytogenes NOT DETECTED NOT DETECTED Final   Staphylococcus species NOT DETECTED NOT DETECTED Final   Staphylococcus aureus (BCID) NOT DETECTED NOT DETECTED Final   Streptococcus species NOT DETECTED NOT DETECTED Final   Streptococcus agalactiae NOT DETECTED NOT DETECTED Final   Streptococcus pneumoniae NOT DETECTED NOT DETECTED Final   Streptococcus pyogenes NOT DETECTED NOT DETECTED Final   Acinetobacter baumannii NOT DETECTED NOT DETECTED Final   Enterobacteriaceae species DETECTED (A) NOT DETECTED Final    Comment: Enterobacteriaceae represent a large family of gram-negative bacteria, not a single organism. CRITICAL RESULT CALLED TO, READ BACK BY AND VERIFIED WITH: SCOTT HALL PHARMD [id number] 03/06/2019 HNM    Enterobacter cloacae complex NOT DETECTED NOT DETECTED Final   Escherichia coli DETECTED (A) NOT DETECTED Final    Comment: CRITICAL RESULT CALLED TO, READ BACK BY AND VERIFIED WITH: Hart Robinsons PHARMD [id number] 03/06/2019 HNM    Klebsiella oxytoca NOT DETECTED NOT DETECTED Final   Klebsiella pneumoniae NOT DETECTED NOT DETECTED Final   Proteus species NOT DETECTED NOT DETECTED Final   Serratia marcescens NOT DETECTED NOT DETECTED Final   Carbapenem resistance NOT DETECTED NOT DETECTED Final   Haemophilus influenzae NOT DETECTED NOT DETECTED Final   Neisseria  meningitidis NOT DETECTED NOT DETECTED Final   Pseudomonas aeruginosa NOT DETECTED NOT DETECTED Final   Candida albicans NOT DETECTED NOT DETECTED Final   Candida glabrata NOT DETECTED NOT DETECTED Final   Candida krusei NOT DETECTED NOT DETECTED Final   Candida parapsilosis NOT DETECTED NOT DETECTED Final   Candida tropicalis NOT DETECTED NOT DETECTED Final  Comment: Performed at [facility], [place] [place], [place] [address]  SARS Coronavirus 2 by RT PCR (hospital order, performed in [facility] hospital lab) Nasopharyngeal Nasopharyngeal Swab     Status: None   Collection Time: 03/05/19  6:07 PM   Specimen: Nasopharyngeal Swab  Result Value Ref Range Status   SARS Coronavirus 2 NEGATIVE NEGATIVE Final    Comment: (NOTE) SARS-CoV-2 target nucleic acids are NOT DETECTED. The SARS-CoV-2 RNA is generally detectable in upper and lower respiratory specimens during the acute phase of infection. The lowest concentration of SARS-CoV-2 viral copies this assay can detect is 250 copies / mL. A negative result does not preclude SARS-CoV-2 infection and should not be used as the sole basis for treatment or other patient management decisions.  A negative result may occur with improper specimen collection / handling, submission of specimen other than nasopharyngeal swab, presence of viral mutation(s) within the areas targeted by this assay, and inadequate number of viral copies (<250 copies / mL). A negative result must be combined with clinical observations, patient history, and epidemiological information. Fact Sheet for Patients:   StrictlyIdeas.no Fact Sheet for Healthcare Providers: BankingDealers.co.za This test is not yet approved or cleared  by the [place] FDA and has been authorized for detection and/or diagnosis of SARS-CoV-2 by FDA under an Emergency Use Authorization (EUA).  This EUA will remain in effect  (meaning this test can be used) for the duration of the COVID-19 declaration under Section 564(b)(1) of the Act, 21 U.S.C. section 360bbb-3(b)(1), unless the authorization is terminated or revoked sooner. Performed at [facility], [address] [place], [place] [address]   MRSA PCR Screening     Status: None   Collection Time: 03/05/19 10:52 PM   Specimen: Nasal Mucosa; Nasopharyngeal  Result Value Ref Range Status   MRSA by PCR NEGATIVE NEGATIVE Final    Comment:        The GeneXpert MRSA Assay (FDA approved for NASAL specimens only), is one component of a comprehensive MRSA colonization surveillance program. It is not intended to diagnose MRSA infection nor to guide or monitor treatment for MRSA infections. Performed at [facility], [address] [place],  [address]      Radiology Studies: Dg Abd 1 View  Result Date: 03/06/2019 CLINICAL DATA:  Abdominal distension EXAM: ABDOMEN - 1 VIEW COMPARISON:  12/05/2008 FINDINGS: Scattered large and small bowel gas is noted. No obstructive changes are seen. No free intraperitoneal air is noted. Degenerative changes of lumbar spine are seen. IMPRESSION: Scattered bowel gas without obstructive change. Electronically Signed   By: Inez Catalina M.D.   On: 03/06/2019 00:39   Ct Head Wo Contrast  Result Date: 03/05/2019 CLINICAL DATA:  Head trauma, multiple falls at home, incontinent, cervical spine trauma high clinical risk EXAM: CT HEAD WITHOUT CONTRAST CT CERVICAL SPINE WITHOUT CONTRAST TECHNIQUE: Multidetector CT imaging of the head and cervical spine was performed following the standard protocol without intravenous contrast. Multiplanar CT image reconstructions of the cervical spine were also generated. COMPARISON:  11/20/2018 FINDINGS: CT HEAD FINDINGS Brain: Generalized atrophy. Normal ventricular morphology. No midline shift or mass effect. Small vessel chronic ischemic changes of deep cerebral white  matter. Otherwise normal appearance of brain parenchyma. No intracranial hemorrhage, mass lesion or evidence of acute infarction. No extra-axial fluid collections. Vascular: No hyperdense vessels. Skull: Intact Sinuses/Orbits: Clear Other: N/A CT CERVICAL SPINE FINDINGS Alignment: Minimal retrolisthesis C4-C5. Remaining alignments normal Skull base and vertebrae: Osseous  demineralization. Multilevel facet degenerative changes. Multilevel disc space narrowing and endplate spur formation diffusely throughout cervical spine. Vertebral body heights maintained. No fracture, additional subluxation, or bone destruction. Minimal scattered motion artifacts. Soft tissues and spinal canal: Prevertebral soft tissues normal thickness. Disc levels: Endplate spurs encroach upon the anterior aspect of the spinal canal and thecal sac at multiple levels. Upper chest: Lung apices clear Other: N/A IMPRESSION: Atrophy with minimal small vessel chronic ischemic changes of deep cerebral white matter. No acute intracranial abnormalities. Degenerative disc and facet disease changes throughout cervical spine as above. No acute cervical spine abnormalities. Electronically Signed   By: Lavonia Dana M.D.   On: 03/05/2019 17:26   Ct Cervical Spine Wo Contrast  Result Date: 03/05/2019 CLINICAL DATA:  Head trauma, multiple falls at home, incontinent, cervical spine trauma high clinical risk EXAM: CT HEAD WITHOUT CONTRAST CT CERVICAL SPINE WITHOUT CONTRAST TECHNIQUE: Multidetector CT imaging of the head and cervical spine was performed following the standard protocol without intravenous contrast. Multiplanar CT image reconstructions of the cervical spine were also generated. COMPARISON:  11/20/2018 FINDINGS: CT HEAD FINDINGS Brain: Generalized atrophy. Normal ventricular morphology. No midline shift or mass effect. Small vessel chronic ischemic changes of deep cerebral white matter. Otherwise normal appearance of brain parenchyma. No  intracranial hemorrhage, mass lesion or evidence of acute infarction. No extra-axial fluid collections. Vascular: No hyperdense vessels. Skull: Intact Sinuses/Orbits: Clear Other: N/A CT CERVICAL SPINE FINDINGS Alignment: Minimal retrolisthesis C4-C5. Remaining alignments normal Skull base and vertebrae: Osseous demineralization. Multilevel facet degenerative changes. Multilevel disc space narrowing and endplate spur formation diffusely throughout cervical spine. Vertebral body heights maintained. No fracture, additional subluxation, or bone destruction. Minimal scattered motion artifacts. Soft tissues and spinal canal: Prevertebral soft tissues normal thickness. Disc levels: Endplate spurs encroach upon the anterior aspect of the spinal canal and thecal sac at multiple levels. Upper chest: Lung apices clear Other: N/A IMPRESSION: Atrophy with minimal small vessel chronic ischemic changes of deep cerebral white matter. No acute intracranial abnormalities. Degenerative disc and facet disease changes throughout cervical spine as above. No acute cervical spine abnormalities. Electronically Signed   By: Lavonia Dana M.D.   On: 03/05/2019 17:26   Dg Chest Port 1 View  Result Date: 03/06/2019 CLINICAL DATA:  Dyspnea. EXAM: PORTABLE CHEST 1 VIEW COMPARISON:  03/06/2019 12:09 a.m. FINDINGS: Lungs are adequately inflated without focal airspace consolidation or effusion. Cardiomediastinal silhouette and remainder of the exam is unchanged. IMPRESSION: No active disease. Electronically Signed   By: Marin Olp M.D.   On: 03/06/2019 10:08   Dg Chest Port 1 View  Result Date: 03/06/2019 CLINICAL DATA:  Hypoxia EXAM: PORTABLE CHEST 1 VIEW COMPARISON:  Film from the previous day FINDINGS: Cardiac shadow is mildly enlarged but stable. Tortuosity of the thoracic aorta is noted accentuated by patient rotation to the right. The lungs are well aerated bilaterally. Slight increased interstitial changes are noted likely related  to a degree of congestive failure. No sizable effusion is noted. No focal infiltrate is seen. No bony abnormality is noted. IMPRESSION: Increased interstitial changes consistent with a degree of congestive heart failure. Electronically Signed   By: Inez Catalina M.D.   On: 03/06/2019 00:37   Dg Chest Port 1 View  Result Date: 03/05/2019 CLINICAL DATA:  Fever. EXAM: PORTABLE CHEST 1 VIEW COMPARISON:  Chest x-ray dated 06/22/2014 FINDINGS: The heart size and mediastinal contours are within normal limits. Both lungs are clear. No acute bone abnormality. Degenerative changes of both shoulders. IMPRESSION:  No active disease in the chest. Electronically Signed   By: Lorriane Shire M.D.   On: 03/05/2019 17:31    Scheduled Meds:  Chlorhexidine Gluconate Cloth  6 each Topical Q0600   enoxaparin (LOVENOX) injection  40 mg Subcutaneous Q24H   insulin aspart  0-9 Units Subcutaneous Q4H   rosuvastatin  40 mg Oral q1800   Continuous Infusions:  acetaminophen 1,000 mg (03/06/19 1110)   diltiazem (CARDIZEM) infusion 5 mg/hr (03/06/19 0952)   lactated ringers     meropenem (MERREM) IV 1 g (03/06/19 0615)     LOS: 1 day   Time spent: 45 minutes.  Lorella Nimrod, MD [facility] Pager [phone]  If 7PM-7AM, please contact night-coverage [email or web address] Password [facility] 03/06/2019, 2:33 PM   This record has been created using Dragon voice recognition software. Errors have been sought and corrected,but may not always be located. Such creation errors do not reflect on the standard of care.

## 2019-03-06 NOTE — Progress Notes (Signed)
*  PRELIMINARY RESULTS* Echocardiogram 2D Echocardiogram has been performed.  Carl Bryan 03/06/2019, 10:31 AM

## 2019-03-06 NOTE — Progress Notes (Signed)
PHARMACY - PHYSICIAN COMMUNICATION CRITICAL VALUE ALERT - BLOOD CULTURE IDENTIFICATION (BCID)  Carl Bryan is an 73 y.o. male who presented to [facility] on 03/05/2019 with a chief complaint of sepsis  Assessment:  Lab reports + BCx, E Coli, KPC not detected  Name of physician (or Provider) Contacted: Jonny Ruiz, NP  Current antibiotics: Vancomycin and Cefepime  Changes to prescribed antibiotics recommended: Meropenem per pharmacy protocol: Recommendations accepted by provider  Results for orders placed or performed during the hospital encounter of 03/05/19  Blood Culture ID Panel (Reflexed) (Collected: 03/05/2019  5:02 PM)  Result Value Ref Range   Enterococcus species NOT DETECTED NOT DETECTED   Listeria monocytogenes NOT DETECTED NOT DETECTED   Staphylococcus species NOT DETECTED NOT DETECTED   Staphylococcus aureus (BCID) NOT DETECTED NOT DETECTED   Streptococcus species NOT DETECTED NOT DETECTED   Streptococcus agalactiae NOT DETECTED NOT DETECTED   Streptococcus pneumoniae NOT DETECTED NOT DETECTED   Streptococcus pyogenes NOT DETECTED NOT DETECTED   Acinetobacter baumannii NOT DETECTED NOT DETECTED   Enterobacteriaceae species DETECTED (A) NOT DETECTED   Enterobacter cloacae complex NOT DETECTED NOT DETECTED   Escherichia coli DETECTED (A) NOT DETECTED   Klebsiella oxytoca NOT DETECTED NOT DETECTED   Klebsiella pneumoniae NOT DETECTED NOT DETECTED   Proteus species NOT DETECTED NOT DETECTED   Serratia marcescens NOT DETECTED NOT DETECTED   Carbapenem resistance NOT DETECTED NOT DETECTED   Haemophilus influenzae NOT DETECTED NOT DETECTED   Neisseria meningitidis NOT DETECTED NOT DETECTED   Pseudomonas aeruginosa NOT DETECTED NOT DETECTED   Candida albicans NOT DETECTED NOT DETECTED   Candida glabrata NOT DETECTED NOT DETECTED   Candida krusei NOT DETECTED NOT DETECTED   Candida parapsilosis NOT DETECTED NOT DETECTED   Candida tropicalis NOT DETECTED NOT DETECTED     Hart Robinsons A 03/06/2019  4:23 AM

## 2019-03-06 NOTE — Consult Note (Addendum)
Reason for Consult: SVT and respiratory distress Referring Physician: Lorella Nimrod, MD  Carl Bryan is an 73 y.o. male.  HPI: 73 year old former smoker with a history as below, who presented to [facility] on 25 November with history of falling at home.  He was admitted after he met sepsis criteria and was noted to have E. coli bacteremia.  Source is still uncertain but appears to be urinary tract.  He was receiving IV antibiotics when he developed sudden onset of temperature to 102, severe rigors, oxygen desaturation and SVT at 160.  Patient developed respiratory distress.  I was called to evaluate the patient emergently.  The patient could not give history due to this acute process ongoing.  We intervened with providing the patient with supplemental oxygen, he was noted to have bronchospasm so he received level albuterol nebulized.  A chest x-ray was obtained that showed no evidence of edema or active disease.  Patient received a 1 L bolus of lactated Ringer's and was started on Cardizem.  He also received IV Tylenol.  His antibiotics have already been switched to meropenem by the admitting team.  The patient currently appears to be responding to these interventions.   Past Medical History:  Diagnosis Date  . Bleeding ulcer   . Bronchospasm   . Diabetes mellitus without complication (HCC)    Type 2  . Diverticulosis   . Pure hypercholesterolemia   . Traumatic amputation of leg above knee [facility])     Past Surgical History:  Procedure Laterality Date  . ABOVE KNEE LEG AMPUTATION  1971  . APPENDECTOMY    . KNEE SURGERY     x 2.    Family History  Problem Relation Age of Onset  . Cancer Mother        Colon cancer  . Lung disease Father     Social History:  reports that he quit smoking about 54 years ago. His smoking use included cigarettes. He has never used smokeless tobacco. He reports that he does not drink alcohol or use drugs.  Allergies:  Allergies  Allergen Reactions  . Doxycycline  Calcium Rash  . Tussionex Pennkinetic Er [Hydrocod Polst-Cpm Polst Er] Itching  . Vytorin [Ezetimibe-Simvastatin]     Sleepy    Medications: I have reviewed the patient's current medications.  Results for orders placed or performed during the hospital encounter of 03/05/19 (from the past 48 hour(s))  Urinalysis, Routine w reflex microscopic     Status: Abnormal   Collection Time: 03/05/19  4:46 PM  Result Value Ref Range   Color, Urine AMBER (A) YELLOW    Comment: BIOCHEMICALS MAY BE AFFECTED BY COLOR   APPearance CLOUDY (A) CLEAR   Specific Gravity, Urine 1.026 1.005 - 1.030   pH 5.0 5.0 - 8.0   Glucose, UA >=500 (A) NEGATIVE mg/dL   Hgb urine dipstick LARGE (A) NEGATIVE   Bilirubin Urine NEGATIVE NEGATIVE   Ketones, ur 20 (A) NEGATIVE mg/dL   Protein, ur >=300 (A) NEGATIVE mg/dL   Nitrite NEGATIVE NEGATIVE   Leukocytes,Ua TRACE (A) NEGATIVE   RBC / HPF >50 (H) 0 - 5 RBC/hpf   WBC, UA >50 (H) 0 - 5 WBC/hpf   Bacteria, UA NONE SEEN NONE SEEN   Squamous Epithelial / LPF NONE SEEN 0 - 5   WBC Clumps PRESENT    Mucus PRESENT     Comment: Performed at [facility], [address] [place], [place] [address]  Urine culture  Status: Abnormal (Preliminary result)   Collection Time: 03/05/19  4:46 PM   Specimen: In/Out Cath Urine  Result Value Ref Range   Specimen Description      IN/OUT CATH URINE Performed at [facility], [address] [place], [place] [address]    Special Requests      NONE Performed at [facility], [place], [place] [address]    Culture (A)     40,000 COLONIES/mL ESCHERICHIA COLI SUSCEPTIBILITIES TO FOLLOW Performed at [facility], [place] [address], [place], [place] [address]    Report Status PENDING   Lactic acid, plasma     Status: Abnormal   Collection Time: 03/05/19  4:59 PM  Result Value Ref Range   Lactic Acid, Venous 3.6 (HH) 0.5 - 1.9 mmol/L    Comment: CRITICAL RESULT CALLED TO, READ  BACK BY AND VERIFIED WITH VALERIE CHANDLER 03/05/19 @ Early Performed at [facility], [address] [place], [place] [address]   Comprehensive metabolic panel     Status: Abnormal   Collection Time: 03/05/19  4:59 PM  Result Value Ref Range   Sodium 130 (L) 135 - 145 mmol/L   Potassium 3.5 3.5 - 5.1 mmol/L   Chloride 98 98 - 111 mmol/L   CO2 15 (L) 22 - 32 mmol/L   Glucose, Bld 337 (H) 70 - 99 mg/dL   BUN 21 8 - 23 mg/dL   Creatinine, Ser 1.19 0.61 - 1.24 mg/dL   Calcium 9.8 8.9 - 10.3 mg/dL   Total Protein 7.7 6.5 - 8.1 g/dL   Albumin 4.1 3.5 - 5.0 g/dL   AST 31 15 - 41 U/L   ALT 24 0 - 44 U/L   Alkaline Phosphatase 104 38 - 126 U/L   Total Bilirubin 3.0 (H) 0.3 - 1.2 mg/dL   GFR calc non Af Amer 60 (L) >60 mL/min   GFR calc Af Amer >60 >60 mL/min   Anion gap 17 (H) 5 - 15    Comment: Performed at [facility], [address] [place], [place] [address]  CBC WITH DIFFERENTIAL     Status: Abnormal   Collection Time: 03/05/19  4:59 PM  Result Value Ref Range   WBC 13.6 (H) 4.0 - 10.5 K/uL   RBC 4.88 4.22 - 5.81 MIL/uL   Hemoglobin 15.5 13.0 - 17.0 g/dL   HCT 44.6 39.0 - 52.0 %   MCV 91.4 80.0 - 100.0 fL   MCH 31.8 26.0 - 34.0 pg   MCHC 34.8 30.0 - 36.0 g/dL   RDW 13.2 11.5 - 15.5 %   Platelets 173 150 - 400 K/uL   nRBC 0.0 0.0 - 0.2 %   Neutrophils Relative % 91 %   Neutro Abs 12.3 (H) 1.7 - 7.7 K/uL   Lymphocytes Relative 5 %   Lymphs Abs 0.7 0.7 - 4.0 K/uL   Monocytes Relative 3 %   Monocytes Absolute 0.3 0.1 - 1.0 K/uL   Eosinophils Relative 0 %   Eosinophils Absolute 0.0 0.0 - 0.5 K/uL   Basophils Relative 0 %   Basophils Absolute 0.0 0.0 - 0.1 K/uL   Immature Granulocytes 1 %   Abs Immature Granulocytes 0.15 (H) 0.00 - 0.07 K/uL    Comment: Performed at [facility], [address] [place], [place] [address]  APTT     Status: None   Collection Time: 03/05/19  4:59 PM  Result Value Ref Range  aPTT 31 24 - 36 seconds     Comment: Performed at [facility], [place] [place], [place] [address]  Protime-INR     Status: None   Collection Time: 03/05/19  4:59 PM  Result Value Ref Range   Prothrombin Time 14.9 11.4 - 15.2 seconds   INR 1.2 0.8 - 1.2    Comment: (NOTE) INR goal varies based on device and disease states. Performed at [facility], [address] [place], [place] [address]   Procalcitonin     Status: None   Collection Time: 03/05/19  4:59 PM  Result Value Ref Range   Procalcitonin 2.98 ng/mL    Comment:        Interpretation: PCT > 2 ng/mL: Systemic infection (sepsis) is likely, unless other causes are known. (NOTE)       Sepsis PCT Algorithm           Lower Respiratory Tract                                      Infection PCT Algorithm    ----------------------------     ----------------------------         PCT < 0.25 ng/mL                PCT < 0.10 ng/mL         Strongly encourage             Strongly discourage   discontinuation of antibiotics    initiation of antibiotics    ----------------------------     -----------------------------       PCT 0.25 - 0.50 ng/mL            PCT 0.10 - 0.25 ng/mL               OR       >80% decrease in PCT            Discourage initiation of                                            antibiotics      Encourage discontinuation           of antibiotics    ----------------------------     -----------------------------         PCT >= 0.50 ng/mL              PCT 0.26 - 0.50 ng/mL               AND       <80% decrease in PCT              Encourage initiation of                                             antibiotics       Encourage continuation           of antibiotics    ----------------------------     -----------------------------        PCT >= 0.50 ng/mL                  PCT > 0.50  ng/mL               AND         increase in PCT                  Strongly encourage                                      initiation of  antibiotics    Strongly encourage escalation           of antibiotics                                     -----------------------------                                           PCT <= 0.25 ng/mL                                                 OR                                        > 80% decrease in PCT                                     Discontinue / Do not initiate                                             antibiotics Performed at [facility], [address], [place], [place] [address]   Troponin I (High Sensitivity)     Status: Abnormal   Collection Time: 03/05/19  4:59 PM  Result Value Ref Range   Troponin I (High Sensitivity) 57 (H) <18 ng/L    Comment: (NOTE) Elevated high sensitivity troponin I (hsTnI) values and significant  changes across serial measurements may suggest ACS but many other  chronic and acute conditions are known to elevate hsTnI results.  Refer to the "Links" section for chest pain algorithms and additional  guidance. Performed at [facility], [address] [place], [place] [address]   Beta-hydroxybutyric acid     Status: Abnormal   Collection Time: 03/05/19  4:59 PM  Result Value Ref Range   Beta-Hydroxybutyric Acid 2.53 (H) 0.05 - 0.27 mmol/L    Comment: Performed at [facility], [address] [place], [place] [address]  Blood Culture (routine x 2)     Status: None (Preliminary result)   Collection Time: 03/05/19  5:00 PM   Specimen: BLOOD  Result Value Ref Range   Specimen Description      BLOOD BLOOD RIGHT HAND Performed at [facility], [place] [address] [place], [place] [address]    Special Requests      BOTTLES DRAWN AEROBIC AND ANAEROBIC Blood Culture adequate  volume Performed at [facility], [place] [address] [place], [place] [address]    Culture  Setup Time      GRAM NEGATIVE RODS IN BOTH AEROBIC AND ANAEROBIC BOTTLES CRITICAL VALUE NOTED.  VALUE IS CONSISTENT WITH PREVIOUSLY REPORTED  AND CALLED VALUE. Performed at [facility], [address], [place], [place] [address]    Culture      CULTURE REINCUBATED FOR BETTER GROWTH Performed at [facility], [place] [address] [place], [place] [address]    Report Status PENDING   Blood Culture (routine x 2)     Status: None (Preliminary result)   Collection Time: 03/05/19  5:02 PM   Specimen: BLOOD LEFT FOREARM  Result Value Ref Range   Specimen Description BLOOD LEFT FOREARM    Special Requests      BOTTLES DRAWN AEROBIC AND ANAEROBIC Blood Culture adequate volume   Culture  Setup Time      GRAM NEGATIVE RODS IN BOTH AEROBIC AND ANAEROBIC BOTTLES CRITICAL RESULT CALLED TO, READ BACK BY AND VERIFIED WITH: Hart Robinsons PHARMD [id number] 03/06/2019 HNM    Culture      CULTURE REINCUBATED FOR BETTER GROWTH Performed at [facility], [place] [address], [place], [place] [address]    Report Status PENDING   Blood Culture ID Panel (Reflexed)     Status: Abnormal   Collection Time: 03/05/19  5:02 PM  Result Value Ref Range   Enterococcus species NOT DETECTED NOT DETECTED   Listeria monocytogenes NOT DETECTED NOT DETECTED   Staphylococcus species NOT DETECTED NOT DETECTED   Staphylococcus aureus (BCID) NOT DETECTED NOT DETECTED   Streptococcus species NOT DETECTED NOT DETECTED   Streptococcus agalactiae NOT DETECTED NOT DETECTED   Streptococcus pneumoniae NOT DETECTED NOT DETECTED   Streptococcus pyogenes NOT DETECTED NOT DETECTED   Acinetobacter baumannii NOT DETECTED NOT DETECTED   Enterobacteriaceae species DETECTED (A) NOT DETECTED    Comment: Enterobacteriaceae represent a large family of gram-negative bacteria, not a single organism. CRITICAL RESULT CALLED TO, READ BACK BY AND VERIFIED WITH: SCOTT HALL PHARMD [id number] 03/06/2019 HNM    Enterobacter cloacae complex NOT DETECTED NOT DETECTED   Escherichia coli DETECTED (A) NOT DETECTED    Comment: CRITICAL RESULT CALLED TO, READ BACK BY AND VERIFIED  WITH: Hart Robinsons PHARMD [id number] 03/06/2019 HNM    Klebsiella oxytoca NOT DETECTED NOT DETECTED   Klebsiella pneumoniae NOT DETECTED NOT DETECTED   Proteus species NOT DETECTED NOT DETECTED   Serratia marcescens NOT DETECTED NOT DETECTED   Carbapenem resistance NOT DETECTED NOT DETECTED   Haemophilus influenzae NOT DETECTED NOT DETECTED   Neisseria meningitidis NOT DETECTED NOT DETECTED   Pseudomonas aeruginosa NOT DETECTED NOT DETECTED   Candida albicans NOT DETECTED NOT DETECTED   Candida glabrata NOT DETECTED NOT DETECTED   Candida krusei NOT DETECTED NOT DETECTED   Candida parapsilosis NOT DETECTED NOT DETECTED   Candida tropicalis NOT DETECTED NOT DETECTED    Comment: Performed at [facility], [address] [place], [place] [address]  POC SARS Coronavirus 2 Ag     Status: None   Collection Time: 03/05/19  5:35 PM  Result Value Ref Range   SARS Coronavirus 2 Ag NEGATIVE NEGATIVE    Comment: (NOTE) SARS-CoV-2 antigen NOT DETECTED.  Negative results are presumptive.  Negative results do not preclude SARS-CoV-2 infection and should not be used as the sole basis for treatment or other patient management decisions, including infection  control decisions, particularly in  the presence of clinical signs and  symptoms consistent with COVID-19, or in those who have been in contact with the virus.  Negative results must be combined with clinical observations, patient history, and epidemiological information. The expected result is Negative. Fact Sheet for Patients: PodPark.tn Fact Sheet for Healthcare Providers: GiftContent.is This test is not yet approved or cleared by the [place] FDA and  has been authorized for detection and/or diagnosis of SARS-CoV-2 by FDA under an Emergency Use Authorization (EUA).  This EUA will remain in effect (meaning this test can be used) for the duration of  the COVID-19 de claration  under Section 564(b)(1) of the Act, 21 U.S.C. section 360bbb-3(b)(1), unless the authorization is terminated or revoked sooner.   SARS Coronavirus 2 by RT PCR (hospital order, performed in [facility] hospital lab) Nasopharyngeal Nasopharyngeal Swab     Status: None   Collection Time: 03/05/19  6:07 PM   Specimen: Nasopharyngeal Swab  Result Value Ref Range   SARS Coronavirus 2 NEGATIVE NEGATIVE    Comment: (NOTE) SARS-CoV-2 target nucleic acids are NOT DETECTED. The SARS-CoV-2 RNA is generally detectable in upper and lower respiratory specimens during the acute phase of infection. The lowest concentration of SARS-CoV-2 viral copies this assay can detect is 250 copies / mL. A negative result does not preclude SARS-CoV-2 infection and should not be used as the sole basis for treatment or other patient management decisions.  A negative result may occur with improper specimen collection / handling, submission of specimen other than nasopharyngeal swab, presence of viral mutation(s) within the areas targeted by this assay, and inadequate number of viral copies (<250 copies / mL). A negative result must be combined with clinical observations, patient history, and epidemiological information. Fact Sheet for Patients:   StrictlyIdeas.no Fact Sheet for Healthcare Providers: BankingDealers.co.za This test is not yet approved or cleared  by the [place] FDA and has been authorized for detection and/or diagnosis of SARS-CoV-2 by FDA under an Emergency Use Authorization (EUA).  This EUA will remain in effect (meaning this test can be used) for the duration of the COVID-19 declaration under Section 564(b)(1) of the Act, 21 U.S.C. section 360bbb-3(b)(1), unless the authorization is terminated or revoked sooner. Performed at [facility], [address] [place], [place] [address]   Troponin I (High Sensitivity)     Status: Abnormal    Collection Time: 03/05/19  7:58 PM  Result Value Ref Range   Troponin I (High Sensitivity) 85 (H) <18 ng/L    Comment: READ BACK AND VERIFIED WITH [facility] 03/05/19 @ 2117  [address] (NOTE) Elevated high sensitivity troponin I (hsTnI) values and significant  changes across serial measurements may suggest ACS but many other  chronic and acute conditions are known to elevate hsTnI results.  Refer to the "Links" section for chest pain algorithms and additional  guidance. Performed at [facility], [address] [place], [place] [address]   Glucose, capillary     Status: Abnormal   Collection Time: 03/05/19  8:29 PM  Result Value Ref Range   Glucose-Capillary 274 (H) 70 - 99 mg/dL  Blood gas, arterial     Status: Abnormal   Collection Time: 03/05/19  9:59 PM  Result Value Ref Range   FIO2 0.32    Delivery systems NASAL CANNULA    pH, Arterial 7.43 7.350 - 7.450   pCO2 arterial 32 32.0 - 48.0 mmHg   pO2, Arterial 102 83.0 - 108.0 mmHg   Bicarbonate  21.2 20.0 - 28.0 mmol/L   Acid-base deficit 2.3 (H) 0.0 - 2.0 mmol/L   O2 Saturation 98.0 %   Patient temperature 37.0    Collection site RIGHT RADIAL    Sample type ARTERIAL DRAW    Allens test (pass/fail) PASS PASS    Comment: Performed at [facility], [address] [place], [place] [address]  CK     Status: Abnormal   Collection Time: 03/05/19  9:59 PM  Result Value Ref Range   Total CK 1,141 (H) 49 - 397 U/L    Comment: Performed at [facility], [address] [place], [place] [address]  Basic metabolic panel     Status: Abnormal   Collection Time: 03/05/19  9:59 PM  Result Value Ref Range   Sodium 131 (L) 135 - 145 mmol/L   Potassium 3.3 (L) 3.5 - 5.1 mmol/L   Chloride 101 98 - 111 mmol/L   CO2 20 (L) 22 - 32 mmol/L   Glucose, Bld 291 (H) 70 - 99 mg/dL   BUN 22 8 - 23 mg/dL   Creatinine, Ser 1.22 0.61 - 1.24 mg/dL   Calcium 9.0 8.9 - 10.3 mg/dL   GFR calc non Af Amer 58 (L) >60  mL/min   GFR calc Af Amer >60 >60 mL/min   Anion gap 10 5 - 15    Comment: Performed at [facility], [address] [place], [place] [address]  Osmolality, urine     Status: None   Collection Time: 03/05/19  9:59 PM  Result Value Ref Range   Osmolality, Ur 323 300 - 900 mOsm/kg    Comment: Performed at [facility], [address] [place], [place] [address]  Sodium, urine, random     Status: None   Collection Time: 03/05/19  9:59 PM  Result Value Ref Range   Sodium, Ur 21 mmol/L    Comment: Performed at [facility], [address] [place], [place] [address]  Creatinine, urine, random     Status: None   Collection Time: 03/05/19  9:59 PM  Result Value Ref Range   Creatinine, Urine 82 mg/dL    Comment: Performed at [facility], [address] [place], [place] [address]  Ammonia     Status: None   Collection Time: 03/05/19  9:59 PM  Result Value Ref Range   Ammonia 21 9 - 35 umol/L    Comment: Performed at [facility], [address] [place], [place] [address]  Lactic acid, plasma     Status: None   Collection Time: 03/05/19 10:20 PM  Result Value Ref Range   Lactic Acid, Venous 1.4 0.5 - 1.9 mmol/L    Comment: Performed at [facility], [place], [place] [address]  Troponin I (High Sensitivity)     Status: Abnormal   Collection Time: 03/05/19 10:28 PM  Result Value Ref Range   Troponin I (High Sensitivity) 76 (H) <18 ng/L    Comment: (NOTE) Elevated high sensitivity troponin I (hsTnI) values and significant  changes across serial measurements may suggest ACS but many other  chronic and acute conditions are known to elevate hsTnI results.  Refer to the "Links" section for chest pain algorithms and additional  guidance. Performed at [facility], [address] [place], [place] [address]   Glucose, capillary     Status: Abnormal   Collection Time: 03/05/19 10:42 PM  Result Value Ref  Range   Glucose-Capillary  303 (H) 70 - 99 mg/dL  MRSA PCR Screening     Status: None   Collection Time: 03/05/19 10:52 PM   Specimen: Nasal Mucosa; Nasopharyngeal  Result Value Ref Range   MRSA by PCR NEGATIVE NEGATIVE    Comment:        The GeneXpert MRSA Assay (FDA approved for NASAL specimens only), is one component of a comprehensive MRSA colonization surveillance program. It is not intended to diagnose MRSA infection nor to guide or monitor treatment for MRSA infections. Performed at [facility], [place], [place] [address]   Troponin I (High Sensitivity)     Status: Abnormal   Collection Time: 03/06/19 12:18 AM  Result Value Ref Range   Troponin I (High Sensitivity) 74 (H) <18 ng/L    Comment: (NOTE) Elevated high sensitivity troponin I (hsTnI) values and significant  changes across serial measurements may suggest ACS but many other  chronic and acute conditions are known to elevate hsTnI results.  Refer to the "Links" section for chest pain algorithms and additional  guidance. Performed at [facility], [address] [place], [place] [address]   Glucose, capillary     Status: Abnormal   Collection Time: 03/06/19  4:18 AM  Result Value Ref Range   Glucose-Capillary 169 (H) 70 - 99 mg/dL  Magnesium     Status: None   Collection Time: 03/06/19  4:19 AM  Result Value Ref Range   Magnesium 2.0 1.7 - 2.4 mg/dL    Comment: Performed at [facility], [address] [place], [place] [address]  Phosphorus     Status: None   Collection Time: 03/06/19  4:19 AM  Result Value Ref Range   Phosphorus 3.1 2.5 - 4.6 mg/dL    Comment: Performed at [facility], [address] [place], [place] [address]  TSH     Status: None   Collection Time: 03/06/19  4:19 AM  Result Value Ref Range   TSH 1.393 0.350 - 4.500 uIU/mL    Comment: Performed by a 3rd Generation assay with a functional sensitivity of <=0.01  uIU/mL. Performed at [facility], [address] [place], [place] [address]   Comprehensive metabolic panel     Status: Abnormal   Collection Time: 03/06/19  4:19 AM  Result Value Ref Range   Sodium 135 135 - 145 mmol/L   Potassium 3.5 3.5 - 5.1 mmol/L   Chloride 105 98 - 111 mmol/L   CO2 21 (L) 22 - 32 mmol/L   Glucose, Bld 168 (H) 70 - 99 mg/dL   BUN 25 (H) 8 - 23 mg/dL   Creatinine, Ser 1.24 0.61 - 1.24 mg/dL   Calcium 9.0 8.9 - 10.3 mg/dL   Total Protein 6.1 (L) 6.5 - 8.1 g/dL   Albumin 3.0 (L) 3.5 - 5.0 g/dL   AST 31 15 - 41 U/L   ALT 22 0 - 44 U/L   Alkaline Phosphatase 54 38 - 126 U/L   Total Bilirubin 1.5 (H) 0.3 - 1.2 mg/dL   GFR calc non Af Amer 57 (L) >60 mL/min   GFR calc Af Amer >60 >60 mL/min   Anion gap 9 5 - 15    Comment: Performed at [facility], [address] [place], [place] [address]  CBC     Status: Abnormal   Collection Time: 03/06/19  4:19 AM  Result Value Ref Range   WBC 17.7 (H) 4.0 - 10.5 K/uL  RBC 4.21 (L) 4.22 - 5.81 MIL/uL   Hemoglobin 13.2 13.0 - 17.0 g/dL   HCT 38.7 (L) 39.0 - 52.0 %   MCV 91.9 80.0 - 100.0 fL   MCH 31.4 26.0 - 34.0 pg   MCHC 34.1 30.0 - 36.0 g/dL   RDW 13.4 11.5 - 15.5 %   Platelets 154 150 - 400 K/uL   nRBC 0.0 0.0 - 0.2 %    Comment: Performed at [facility], [address] [place], [place] [address]  CK     Status: Abnormal   Collection Time: 03/06/19  4:19 AM  Result Value Ref Range   Total CK 725 (H) 49 - 397 U/L    Comment: Performed at [facility], [address] [place], [place] [address]  Hemoglobin A1c     Status: Abnormal   Collection Time: 03/06/19  4:19 AM  Result Value Ref Range   Hgb A1c MFr Bld 8.0 (H) 4.8 - 5.6 %    Comment: (NOTE) Pre diabetes:          5.7%-6.4% Diabetes:              >6.4% Glycemic control for   <7.0% adults with diabetes    Mean Plasma Glucose 182.9 mg/dL    Comment: Performed at [facility], [place] [address]  [place], [place] [address]  Glucose, capillary     Status: Abnormal   Collection Time: 03/06/19  7:43 AM  Result Value Ref Range   Glucose-Capillary 121 (H) 70 - 99 mg/dL  Glucose, capillary     Status: Abnormal   Collection Time: 03/06/19 11:28 AM  Result Value Ref Range   Glucose-Capillary 140 (H) 70 - 99 mg/dL    Dg Abd 1 View  Result Date: 03/06/2019 CLINICAL DATA:  Abdominal distension EXAM: ABDOMEN - 1 VIEW COMPARISON:  12/05/2008 FINDINGS: Scattered large and small bowel gas is noted. No obstructive changes are seen. No free intraperitoneal air is noted. Degenerative changes of lumbar spine are seen. IMPRESSION: Scattered bowel gas without obstructive change. Electronically Signed   By: Inez Catalina M.D.   On: 03/06/2019 00:39   Ct Head Wo Contrast  Result Date: 03/05/2019 CLINICAL DATA:  Head trauma, multiple falls at home, incontinent, cervical spine trauma high clinical risk EXAM: CT HEAD WITHOUT CONTRAST CT CERVICAL SPINE WITHOUT CONTRAST TECHNIQUE: Multidetector CT imaging of the head and cervical spine was performed following the standard protocol without intravenous contrast. Multiplanar CT image reconstructions of the cervical spine were also generated. COMPARISON:  11/20/2018 FINDINGS: CT HEAD FINDINGS Brain: Generalized atrophy. Normal ventricular morphology. No midline shift or mass effect. Small vessel chronic ischemic changes of deep cerebral white matter. Otherwise normal appearance of brain parenchyma. No intracranial hemorrhage, mass lesion or evidence of acute infarction. No extra-axial fluid collections. Vascular: No hyperdense vessels. Skull: Intact Sinuses/Orbits: Clear Other: N/A CT CERVICAL SPINE FINDINGS Alignment: Minimal retrolisthesis C4-C5. Remaining alignments normal Skull base and vertebrae: Osseous demineralization. Multilevel facet degenerative changes. Multilevel disc space narrowing and endplate spur formation diffusely throughout cervical spine. Vertebral body  heights maintained. No fracture, additional subluxation, or bone destruction. Minimal scattered motion artifacts. Soft tissues and spinal canal: Prevertebral soft tissues normal thickness. Disc levels: Endplate spurs encroach upon the anterior aspect of the spinal canal and thecal sac at multiple levels. Upper chest: Lung apices clear Other: N/A IMPRESSION: Atrophy with minimal small vessel chronic ischemic changes of deep cerebral white matter. No acute intracranial abnormalities. Degenerative disc and  facet disease changes throughout cervical spine as above. No acute cervical spine abnormalities. Electronically Signed   By: Lavonia Dana M.D.   On: 03/05/2019 17:26   Ct Cervical Spine Wo Contrast  Result Date: 03/05/2019 CLINICAL DATA:  Head trauma, multiple falls at home, incontinent, cervical spine trauma high clinical risk EXAM: CT HEAD WITHOUT CONTRAST CT CERVICAL SPINE WITHOUT CONTRAST TECHNIQUE: Multidetector CT imaging of the head and cervical spine was performed following the standard protocol without intravenous contrast. Multiplanar CT image reconstructions of the cervical spine were also generated. COMPARISON:  11/20/2018 FINDINGS: CT HEAD FINDINGS Brain: Generalized atrophy. Normal ventricular morphology. No midline shift or mass effect. Small vessel chronic ischemic changes of deep cerebral white matter. Otherwise normal appearance of brain parenchyma. No intracranial hemorrhage, mass lesion or evidence of acute infarction. No extra-axial fluid collections. Vascular: No hyperdense vessels. Skull: Intact Sinuses/Orbits: Clear Other: N/A CT CERVICAL SPINE FINDINGS Alignment: Minimal retrolisthesis C4-C5. Remaining alignments normal Skull base and vertebrae: Osseous demineralization. Multilevel facet degenerative changes. Multilevel disc space narrowing and endplate spur formation diffusely throughout cervical spine. Vertebral body heights maintained. No fracture, additional subluxation, or bone  destruction. Minimal scattered motion artifacts. Soft tissues and spinal canal: Prevertebral soft tissues normal thickness. Disc levels: Endplate spurs encroach upon the anterior aspect of the spinal canal and thecal sac at multiple levels. Upper chest: Lung apices clear Other: N/A IMPRESSION: Atrophy with minimal small vessel chronic ischemic changes of deep cerebral white matter. No acute intracranial abnormalities. Degenerative disc and facet disease changes throughout cervical spine as above. No acute cervical spine abnormalities. Electronically Signed   By: Lavonia Dana M.D.   On: 03/05/2019 17:26   Dg Chest Port 1 View  Result Date: 03/06/2019 CLINICAL DATA:  Dyspnea. EXAM: PORTABLE CHEST 1 VIEW COMPARISON:  03/06/2019 12:09 a.m. FINDINGS: Lungs are adequately inflated without focal airspace consolidation or effusion. Cardiomediastinal silhouette and remainder of the exam is unchanged. IMPRESSION: No active disease. Electronically Signed   By: Marin Olp M.D.   On: 03/06/2019 10:08   Dg Chest Port 1 View  Result Date: 03/06/2019 CLINICAL DATA:  Hypoxia EXAM: PORTABLE CHEST 1 VIEW COMPARISON:  Film from the previous day FINDINGS: Cardiac shadow is mildly enlarged but stable. Tortuosity of the thoracic aorta is noted accentuated by patient rotation to the right. The lungs are well aerated bilaterally. Slight increased interstitial changes are noted likely related to a degree of congestive failure. No sizable effusion is noted. No focal infiltrate is seen. No bony abnormality is noted. IMPRESSION: Increased interstitial changes consistent with a degree of congestive heart failure. Electronically Signed   By: Inez Catalina M.D.   On: 03/06/2019 00:37   Dg Chest Port 1 View  Result Date: 03/05/2019 CLINICAL DATA:  Fever. EXAM: PORTABLE CHEST 1 VIEW COMPARISON:  Chest x-ray dated 06/22/2014 FINDINGS: The heart size and mediastinal contours are within normal limits. Both lungs are clear. No acute bone  abnormality. Degenerative changes of both shoulders. IMPRESSION: No active disease in the chest. Electronically Signed   By: Lorriane Shire M.D.   On: 03/05/2019 17:31    Review of Systems  Unable to perform ROS: Acuity of condition   Blood pressure (!) 142/79, pulse 93, temperature (!) 102.5 F (39.2 C), temperature source Oral, resp. rate (!) 24, height '5\' 6"'  (1.676 m), weight 71.2 kg, SpO2 97 %. Physical Exam  Constitutional: He appears well-developed. He appears toxic. He appears distressed. Face mask in place.  Patient exhibiting rigors, unable to  speak due to respiratory distress and rigors  HENT:  Head: Normocephalic and atraumatic.  Right Ear: External ear normal.  Left Ear: External ear normal.  Nose: Nose normal.  Mouth/Throat: Mucous membranes are dry.  Eyes: Pupils are equal, round, and reactive to light. Conjunctivae are normal. No scleral icterus.  Neck: Neck supple. No JVD present. No tracheal deviation present.  Cardiovascular: Regular rhythm and normal heart sounds. Tachycardia present. Exam reveals no gallop.  SVT at 160  Respiratory: No stridor. He is in respiratory distress. He has wheezes. He has no rhonchi. He has no rales.  Mostly upper airway pseudowheeze  GI: Soft. Bowel sounds are normal. He exhibits no distension. There is no abdominal tenderness.  Musculoskeletal: Normal range of motion.        General: Deformity present.     Comments: Status post right AKA (traumatic)  Lymphadenopathy:    He has no cervical adenopathy.  Neurological:  No overt focal deficit, cannot assess orientation as the patient is currently in distress.  Skin: Skin is warm and dry.  Psychiatric: His mood appears anxious.    Assessment/Plan: Gram-negative severe sepsis, E. coli bacteremia of urinary tract source:  Continue meropenem  IV fluids to volume resuscitate  Antipyretics as needed  Oxygen supplementation as needed (this is actually improved now that heart rate is  controlled)  SVT due to physiologic stress and hypovolemia:  Volume resuscitate, LR bolus   Cardizem infusion, wean off as tolerated  Control fever  Elevated troponins relatively flat:  Suspect demand ischemia  Monitor closely  Continue to trend troponins  Diabetes mellitus type 2:  Glycemic control  Rhabdomyolysis, mild:  Improving with volume resuscitation  Monitor CK  Bronchospasm:  Xopenex as needed   Thank you for allowing [place] to participate in this patient's care.  Critical care time total: 60 minutes  C. Derrill Kay, MD [facility] 03/06/2019, 6:12 PM    This note was dictated using voice recognition software/Dragon.  Despite best efforts to proofread, errors can occur which can change the meaning.  Any change was purely unintentional.

## 2019-03-06 NOTE — Progress Notes (Signed)
PT Cancellation Note  Patient Details Name: Carl Bryan MRN: [id number] DOB: Nov 23, 1945   Cancelled Treatment:    Reason Eval/Treat Not Completed: Other (comment). Chart reviewed. Pt currently febrile (102.6) with elevated HR at 116, just started on cardizem drip this morning. Pt also just converted to HFNC due to desaturation this AM. Pt currently medically unstable to participate in therapy evaluation. Will hold at this time and re-attempt next date.   Luigi Stuckey 03/06/2019, 11:15 AM  Greggory Stallion, PT, DPT [phone]

## 2019-03-06 NOTE — Progress Notes (Signed)
Received call from [place] saying pt's HR was 150. Immediately went to pt's room and he was found shaking with nasal cannula slid out of his nose. Spo2 was 80% on room air, heart rate at 150 and Resp 27. Pt audibly wheezing and puffing. Pt oriented and talking saying he was cold. Pt immediately put back on nasal cannula and O2 increased, sat him up in bed and called charge nurse, Dr. Duwayne Heck and hospitalist and respiratory therapist. Staff immediately at bedside and performing orders from doctor [facility].

## 2019-03-06 NOTE — Progress Notes (Addendum)
Pharmacy Antibiotic Note  Carl Bryan is a 73 y.o. male admitted on 03/05/2019 with sepsis.  Pharmacy has been consulted for Vancomycin and Cefepime dosing.  Pt received initial doses in ED.  Lab called w/ + BCx = E Coli  Plan: Meropenem 1gm IV q12hrs  Height: 5\' 6"  (167.6 cm) Weight: 156 lb 15.5 oz (71.2 kg) IBW/kg (Calculated) : 63.8  Temp (24hrs), Avg:98.7 F (37.1 C), Min:97.8 F (36.6 C), Max:101.2 F (38.4 C)  Recent Labs  Lab 03/05/19 1659 03/05/19 2159 03/05/19 2220  WBC 13.6*  --   --   CREATININE 1.19 1.22  --   LATICACIDVEN 3.6*  --  1.4    Estimated Creatinine Clearance: 47.9 mL/min (by C-G formula based on SCr of 1.22 mg/dL).    Allergies  Allergen Reactions  . Doxycycline Calcium Rash  . Tussionex Pennkinetic Er [Hydrocod Polst-Cpm Polst Er] Itching  . Vytorin [Ezetimibe-Simvastatin]     Sleepy    Antimicrobials this admission: Meropenem 11/26 >> Vancomycin 11/25 x 1 Cefepime 11/25 x 1 Flagyl 11/25 x 1  Dose adjustments this admission:   Microbiology results:  BCx: E Coli per lab report  UCx:    Sputum:    MRSA PCR:   Thank you for allowing pharmacy to be a part of this patient's care.  Hart Robinsons A 03/06/2019 2:51 AM

## 2019-03-07 ENCOUNTER — Inpatient Hospital Stay: Payer: Medicare HMO

## 2019-03-07 ENCOUNTER — Encounter: Payer: Self-pay | Admitting: Radiology

## 2019-03-07 LAB — URINE CULTURE: Culture: 40000 — AB

## 2019-03-07 LAB — CBC
HCT: 35.4 % — ABNORMAL LOW (ref 39.0–52.0)
Hemoglobin: 12.4 g/dL — ABNORMAL LOW (ref 13.0–17.0)
MCH: 31.2 pg (ref 26.0–34.0)
MCHC: 35 g/dL (ref 30.0–36.0)
MCV: 89.2 fL (ref 80.0–100.0)
Platelets: 137 10*3/uL — ABNORMAL LOW (ref 150–400)
RBC: 3.97 MIL/uL — ABNORMAL LOW (ref 4.22–5.81)
RDW: 13.6 % (ref 11.5–15.5)
WBC: 11.3 10*3/uL — ABNORMAL HIGH (ref 4.0–10.5)
nRBC: 0 % (ref 0.0–0.2)

## 2019-03-07 LAB — GLUCOSE, CAPILLARY
Glucose-Capillary: 127 mg/dL — ABNORMAL HIGH (ref 70–99)
Glucose-Capillary: 120 mg/dL — ABNORMAL HIGH (ref 70–99)
Glucose-Capillary: 133 mg/dL — ABNORMAL HIGH (ref 70–99)
Glucose-Capillary: 124 mg/dL — ABNORMAL HIGH (ref 70–99)

## 2019-03-07 LAB — MAGNESIUM: Magnesium: 2 mg/dL (ref 1.7–2.4)

## 2019-03-07 LAB — COMPREHENSIVE METABOLIC PANEL
ALT: 23 U/L (ref 0–44)
AST: 25 U/L (ref 15–41)
Albumin: 2.7 g/dL — ABNORMAL LOW (ref 3.5–5.0)
Alkaline Phosphatase: 59 U/L (ref 38–126)
Anion gap: 12 (ref 5–15)
BUN: 25 mg/dL — ABNORMAL HIGH (ref 8–23)
CO2: 18 mmol/L — ABNORMAL LOW (ref 22–32)
Calcium: 8.7 mg/dL — ABNORMAL LOW (ref 8.9–10.3)
Chloride: 103 mmol/L (ref 98–111)
Creatinine, Ser: 1.26 mg/dL — ABNORMAL HIGH (ref 0.61–1.24)
GFR calc Af Amer: >60 mL/min (ref >60)
GFR calc non Af Amer: 56 mL/min — ABNORMAL LOW (ref >60)
Glucose, Bld: 139 mg/dL — ABNORMAL HIGH (ref 70–99)
Potassium: 3.2 mmol/L — ABNORMAL LOW (ref 3.5–5.1)
Sodium: 133 mmol/L — ABNORMAL LOW (ref 135–145)
Total Bilirubin: 1.7 mg/dL — ABNORMAL HIGH (ref 0.3–1.2)
Total Protein: 5.9 g/dL — ABNORMAL LOW (ref 6.5–8.1)

## 2019-03-07 LAB — FIBRIN DERIVATIVES D-DIMER (ARMC ONLY): Fibrin derivatives D-dimer (ARMC): 2520.09 ng/mL (FEU) — ABNORMAL HIGH (ref 0.00–499.00)

## 2019-03-07 LAB — CK: Total CK: 226 U/L (ref 49–397)

## 2019-03-07 LAB — TROPONIN I (HIGH SENSITIVITY): Troponin I (High Sensitivity): 38 ng/L — ABNORMAL HIGH (ref <18)

## 2019-03-07 MED ORDER — ENOXAPARIN SODIUM 40 MG/0.4ML ~~LOC~~ SOLN
40.0000 mg | SUBCUTANEOUS | Status: DC
  Administered 2019-03-07 – 2019-03-10 (×4): 40 mg via SUBCUTANEOUS
  Filled 2019-03-07 (×4): qty 0.4

## 2019-03-07 MED ORDER — ORAL CARE MOUTH RINSE
15.0000 mL | Freq: Two times a day (BID) | OROMUCOSAL | Status: DC
  Administered 2019-03-07 – 2019-03-11 (×8): 15 mL via OROMUCOSAL

## 2019-03-07 MED ORDER — IPRATROPIUM-ALBUTEROL 0.5-2.5 (3) MG/3ML IN SOLN
3.0000 mL | Freq: Four times a day (QID) | RESPIRATORY_TRACT | Status: DC
  Administered 2019-03-07 – 2019-03-08 (×7): 3 mL via RESPIRATORY_TRACT
  Filled 2019-03-07 (×6): qty 3

## 2019-03-07 MED ORDER — SODIUM CHLORIDE 0.9 % IV SOLN
INTRAVENOUS | Status: AC
  Administered 2019-03-07: 12:00:00 via INTRAVENOUS

## 2019-03-07 MED ORDER — SODIUM CHLORIDE 0.9 % IV SOLN: INTRAVENOUS | Status: DC

## 2019-03-07 MED ORDER — IOHEXOL 350 MG/ML SOLN
75.0000 mL | Freq: Once | INTRAVENOUS | Status: AC | PRN
  Administered 2019-03-07: 75 mL via INTRAVENOUS

## 2019-03-07 MED ORDER — CEFAZOLIN SODIUM-DEXTROSE 2-4 GM/100ML-% IV SOLN
2.0000 g | Freq: Three times a day (TID) | INTRAVENOUS | Status: DC
  Administered 2019-03-07 – 2019-03-11 (×12): 2 g via INTRAVENOUS
  Filled 2019-03-07 (×18): qty 100

## 2019-03-07 MED ORDER — POTASSIUM CHLORIDE CRYS ER 20 MEQ PO TBCR
40.0000 meq | EXTENDED_RELEASE_TABLET | Freq: Once | ORAL | Status: AC
  Administered 2019-03-07: 40 meq via ORAL
  Filled 2019-03-07: qty 2

## 2019-03-07 NOTE — Progress Notes (Signed)
CBG of 137

## 2019-03-07 NOTE — Evaluation (Signed)
Physical Therapy Evaluation Patient Details Name: Carl Bryan MRN: [id number] DOB: 1945/09/28 Today's Date: 03/07/2019   History of Present Illness  73 y.o. male with medical history significant of dm2, HTN, HLD.  Pt has been weak recently with multiple falls, admitted to CCU with sepsis.  Pt had R hip fx in August and reports he has not used prosthetic much since (was NWBing initially)  Clinical Impression  Pt did relatively well with PT exam in CCU.  He was on 5L O2 with O2 sats at 99%, able to maintain 95% or higher on room air with light in-bed activities.  Pt was able to get to EOB w/o assist (though he did need help to don sock - typically he is able to do this w/o assist).  He was able to do some hop-to ambulation with walker but did fatigue relatively quickly (apparently at baseline he is able to go hunting with crutches, run prolonged errands, drive, etc) with HR climbing from ~100 to 120s and O2 slowly dropping as low as 86% on room air with the effort.  He showed good confidence and did not have any LOBs or overt safety concerns apart from fatigue.  Pt indicated that he was not interested in HHPT, however given the change from his baseline it would be better if pt showed any interest.      Follow Up Recommendations Home health PT(pt not interested, still probably a good idea)    Equipment Recommendations  None recommended by PT(Pt has w/c, walker)    Recommendations for Other Services       Precautions / Restrictions Precautions Precautions: Fall Restrictions Weight Bearing Restrictions: No      Mobility  Bed Mobility Overal bed mobility: Modified Independent             General bed mobility comments: Pt did take some extra time and effort to get to EOB, could not don socks w/o assist - reports that he normally can  Transfers Overall transfer level: Modified independent Equipment used: Rolling walker (2 wheeled)             General transfer comment: Pt was  heavily reliant on UEs to attain standing but was able to rise w/o assist or safety concerns.  Ambulation/Gait Ambulation/Gait assistance: Supervision Gait Distance (Feet): 35 Feet Assistive device: Rolling walker (2 wheeled)       General Gait Details: Pt able to hop-to with good UE use and AD control.  He did fatigue relatively quickly with O2 dropping into (on room air, stayed in high 90s at rest on room air in sitting and w/o bed activity)  Pt did not have any LOBs but clearly was becoming tired after this effort.  Pt's baseline is considerably more independent and confident than today's effort.  Stairs            Wheelchair Mobility    Modified Rankin (Stroke Patients Only)       Balance                                             Pertinent Vitals/Pain Pain Assessment: 0-10(chornic L knee pain - sees ortho for injections) Pain Score: 4     Home Living Family/patient expects to be discharged to:: Private residence Living Arrangements: Spouse/significant other Available Help at Discharge: Family   Home Access: Stairs to enter(has an enterance  with no step ups as well ) Entrance Stairs-Rails: (yes, but he uses his crutches) Entrance Stairs-Number of Steps: 2 Home Layout: One level Home Equipment: Walker - 2 wheels;Crutches;Wheelchair - manual      Prior Function Level of Independence: Independent with assistive device(s)         Comments: Pt reports that he has gotten around with crutches for years and still does well with these (drives, goes hunting, etc) reports he rarely ever needs RW or w/c      Hand Dominance        Extremity/Trunk Assessment   Upper Extremity Assessment Upper Extremity Assessment: Overall WFL for tasks assessed    Lower Extremity Assessment Lower Extremity Assessment: Overall WFL for tasks assessed       Communication   Communication: No difficulties  Cognition Arousal/Alertness: Awake/alert Behavior  During Therapy: WFL for tasks assessed/performed Overall Cognitive Status: Within Functional Limits for tasks assessed                                        General Comments      Exercises     Assessment/Plan    PT Assessment Patient needs continued PT services  PT Problem List Decreased strength;Decreased range of motion;Decreased activity tolerance;Decreased balance;Decreased mobility;Decreased coordination;Decreased cognition;Decreased knowledge of use of DME;Decreased safety awareness;Pain       PT Treatment Interventions DME instruction;Gait training;Stair training;Functional mobility training;Therapeutic activities;Therapeutic exercise;Balance training;Neuromuscular re-education;Patient/family education    PT Goals (Current goals can be found in the Care Plan section)  Acute Rehab PT Goals Patient Stated Goal: go home PT Goal Formulation: With patient Time For Goal Achievement: 03/21/19 Potential to Achieve Goals: Good    Frequency Min 2X/week   Barriers to discharge        Co-evaluation               AM-PAC PT "6 Clicks" Mobility  Outcome Measure Help needed turning from your back to your side while in a flat bed without using bedrails?: None Help needed moving from lying on your back to sitting on the side of a flat bed without using bedrails?: None Help needed moving to and from a bed to a chair (including a wheelchair)?: A Little Help needed standing up from a chair using your arms (e.g., wheelchair or bedside chair)?: A Little Help needed to walk in hospital room?: A Little Help needed climbing 3-5 steps with a railing? : A Lot 6 Click Score: 19    End of Session Equipment Utilized During Treatment: Gait belt;Oxygen(5L) Activity Tolerance: Patient limited by fatigue Patient left: in bed;with call bell/phone within reach Nurse Communication: Mobility status(vitals with activity) PT Visit Diagnosis: Muscle weakness (generalized)  (M62.81);Difficulty in walking, not elsewhere classified (R26.2)    Time: 8315-1761 PT Time Calculation (min) (ACUTE ONLY): 28 min   Charges:   PT Evaluation $PT Eval Low Complexity: 1 Low PT Treatments $Gait Training: 8-22 mins        Malachi Pro, DPT 03/07/2019, 12:01 PM

## 2019-03-07 NOTE — Progress Notes (Signed)
OT Cancellation Note  Patient Details Name: Carl Bryan MRN: [id number] DOB: [birth year]   Cancelled Treatment:     REattempted OT eval this pm however nurse in the room trying to start another IV and reports afterwards patient will be transported to CT scan.  Will continue attempts.   Amy T Lovett, OTR/L, CLT   Lovett,Amy 03/07/2019, 2:21 PM

## 2019-03-07 NOTE — Progress Notes (Signed)
OT Cancellation Note  Patient Details Name: Carl Bryan MRN: [id number] DOB: May 15, 1945   Cancelled Treatment:     Attempted to see patient at 1206 pm for OT evaluation, patient was sleeping and requested therapist to come back later in the day.  Lunch tray sitting beside patient untouched. Will continue attempts Mabry Tift T Yenty Bloch, OTR/L, CLT   Marla Pouliot 03/07/2019, 12:07 PM

## 2019-03-07 NOTE — Progress Notes (Signed)
PROGRESS NOTE    ANANIAS KOLANDER  MWU:[id number] DOB: 1945-07-31 DOA: 03/05/2019 PCP: Volney American, PA-C   Brief Narrative:  Carl Bryan is a 73 y.o. male with medical history significant of dm2, HTN, HLD Status post traumatic amputation  right AKA Presented with falls at home has been very weak has been having multiple falls for the past few days. On arrival to ED he met sepsis criteria, febrile at 101, tachycardic, tachypneic and hypotensive.  He was initially started on cefepime and vancomycin and was given IV fluid according to sepsis protocol. Urine and blood cultures are growing E. Coli-antibiotics switched to meropenem.  Subjective: Patient was feeling better when seen during morning rounds.  He was asking when he can go home.  He denies any chest pain or shortness of breath. He was unable to sleep well last night due to disturbance in the unit.  Assessment & Plan:   Active Problems:   Pure hypercholesterolemia   Diabetes mellitus associated with hormonal etiology ([place])   Traumatic amputation of right leg above knee (HCC)   Essential (primary) hypertension   CAD (coronary artery disease)   Sepsis ([place])   Acute lower UTI   Acute metabolic encephalopathy   Elevated troponin   Rhabdomyolysis   Hypokalemia   Hyponatremia   Urinary tract infection with hematuria   Multiple falls   Hypoxia  Sepsis secondary to UTI.  Both urine and blood cultures are growing E. Coli. Initial improvement in labs which includes lactic acid and electrolytes. Procalcitonin elevated at 2.98. Patient was switched to meropenem according to blood culture reports-pending sensitivity. Urine culture sensitivity came back E. coli with good sensitivity, I assume it is going to be the same for blood culture-waiting for final results before switching antibiotics. Continue to have low-grade fever. -Continue meropenem, will switch once blood culture results are available. -Continue monitoring.  Acute  hypoxic respiratory failure.  Patient continued to have increasing oxygen requirement.  Currently saturating in mid 90s on 5 L high flow nasal cannula.  Repeated two-view chest x-ray this morning which was negative for any infiltrate or pulmonary edema. Wells score for PE was 1.5 which shows low risk but Geneva modified score was 6 which is consistent with moderate risk of PE. As we do not have any other explanation for worsening hypoxia D-dimer was checked which was elevated above 2500. -Stat CTA. -Lower extremity Doppler.  Elevated troponin.  History of CAD.  Most likely demand.  No chest pain.  Diabetes.  Uncontrolled as A1c is 8.0. -Continue CBG monitoring with SSI.  Rhabdomyolysis.  Most likely due to fever.  CK within normal limit now. -Continue IV hydration and monitor.  Acute encephalopathy.  Most likely metabolic secondary to bacteremia.  Resolved and patient was alert and oriented.  Hypertension.  Currently softer blood pressure. -Keep holding home meds.  Traumatic amputation of right leg above knee ([place]). Chronic and stable.  Electrolyte abnormalities.  Secondary to sepsis.  Hypokalemia and mild hyponatremia today. -Continue to monitor-replete as needed.  Dyslipidemia. -Continue home meds.  Objective: Vitals:   03/07/19 0900 03/07/19 1000 03/07/19 1017 03/07/19 1100  BP:      Pulse: 97 94  99  Resp: (!) '24 20  17  ' Temp:      TempSrc:      SpO2: 98% 96% 95% 96%  Weight:      Height:        Intake/Output Summary (Last 24 hours) at 03/07/2019 1252 Last data filed  at 03/07/2019 1133 Gross per 24 hour  Intake 327.87 ml  Output 1900 ml  Net -1572.13 ml   Filed Weights   03/05/19 1951 03/05/19 2350  Weight: 78 kg 71.2 kg    Examination:  General exam: Appears calm and comfortable.  Respiratory system: Clear to auscultation. Respiratory effort normal, mild tachypnea. Cardiovascular system: Sinus tachycardia, no JVD, murmurs, rubs, gallops or clicks. No  pedal edema. Gastrointestinal system: Abdomen is nondistended, soft and nontender. No organomegaly or masses felt. Normal bowel sounds heard. Central nervous system: Alert and oriented. No focal neurological deficits. Extremities: Right AKA. Skin: No rashes, lesions or ulcers Psychiatry: Judgement and insight appear normal. Mood & affect appropriate.   DVT prophylaxis: Lovenox Code Status: Full Family Communication:  Wife was updated on phone. Disposition Plan: Pending improvement.  Consultants:  PCCM  Procedures:   Antimicrobials:  Meropenem.  Started on 11/26  Data Reviewed: I have personally reviewed following labs and imaging studies  CBC: Recent Labs  Lab 03/05/19 1659 03/06/19 0419 03/07/19 0552  WBC 13.6* 17.7* 11.3*  NEUTROABS 12.3*  --   --   HGB 15.5 13.2 12.4*  HCT 44.6 38.7* 35.4*  MCV 91.4 91.9 89.2  PLT 173 154 067*   Basic Metabolic Panel: Recent Labs  Lab 03/05/19 1659 03/05/19 2159 03/06/19 0419 03/07/19 0552  NA 130* 131* 135 133*  K 3.5 3.3* 3.5 3.2*  CL 98 101 105 103  CO2 15* 20* 21* 18*  GLUCOSE 337* 291* 168* 139*  BUN 21 22 25* 25*  CREATININE 1.19 1.22 1.24 1.26*  CALCIUM 9.8 9.0 9.0 8.7*  MG  --   --  2.0 2.0  PHOS  --   --  3.1  --    GFR: Estimated Creatinine Clearance: 46.4 mL/min (A) (by C-G formula based on SCr of 1.26 mg/dL (H)). Liver Function Tests: Recent Labs  Lab 03/05/19 1659 03/06/19 0419 03/07/19 0552  AST '31 31 25  ' ALT '24 22 23  ' ALKPHOS 104 54 59  BILITOT 3.0* 1.5* 1.7*  PROT 7.7 6.1* 5.9*  ALBUMIN 4.1 3.0* 2.7*   No results for input(s): LIPASE, AMYLASE in the last 168 hours. Recent Labs  Lab 03/05/19 2159  AMMONIA 21   Coagulation Profile: Recent Labs  Lab 03/05/19 1659  INR 1.2   Cardiac Enzymes: Recent Labs  Lab 03/05/19 2159 03/06/19 0419 03/07/19 0552  CKTOTAL 1,141* 725* 226   BNP (last 3 results) No results for input(s): PROBNP in the last 8760 hours. HbA1C: Recent Labs     03/06/19 0419  HGBA1C 8.0*   CBG: Recent Labs  Lab 03/06/19 1941 03/06/19 2333 03/07/19 0426 03/07/19 0750 03/07/19 1134  GLUCAP 161* 116* 127* 120* 133*   Lipid Profile: No results for input(s): CHOL, HDL, LDLCALC, TRIG, CHOLHDL, LDLDIRECT in the last 72 hours. Thyroid Function Tests: Recent Labs    03/06/19 0419  TSH 1.393   Anemia Panel: No results for input(s): VITAMINB12, FOLATE, FERRITIN, TIBC, IRON, RETICCTPCT in the last 72 hours. Sepsis Labs: Recent Labs  Lab 03/05/19 1659 03/05/19 2220  PROCALCITON 2.98  --   LATICACIDVEN 3.6* 1.4    Recent Results (from the past 240 hour(s))  Urine culture     Status: Abnormal   Collection Time: 03/05/19  4:46 PM   Specimen: In/Out Cath Urine  Result Value Ref Range Status   Specimen Description   Final    IN/OUT CATH URINE Performed at [facility], [address] [place], [place]  [address]    Special Requests   Final    NONE Performed at [facility], [place], [place] [address]    Culture 40,000 COLONIES/mL ESCHERICHIA COLI (A)  Final   Report Status 03/07/2019 FINAL  Final   Organism ID, Bacteria ESCHERICHIA COLI (A)  Final      Susceptibility   Escherichia coli - MIC*    AMPICILLIN >=32 RESISTANT Resistant     CEFAZOLIN <=4 SENSITIVE Sensitive     CEFTRIAXONE <=1 SENSITIVE Sensitive     CIPROFLOXACIN >=4 RESISTANT Resistant     GENTAMICIN <=1 SENSITIVE Sensitive     IMIPENEM <=0.25 SENSITIVE Sensitive     NITROFURANTOIN <=16 SENSITIVE Sensitive     TRIMETH/SULFA <=20 SENSITIVE Sensitive     AMPICILLIN/SULBACTAM 16 INTERMEDIATE Intermediate     PIP/TAZO <=4 SENSITIVE Sensitive     Extended ESBL NEGATIVE Sensitive     * 40,000 COLONIES/mL ESCHERICHIA COLI  Blood Culture (routine x 2)     Status: None (Preliminary result)   Collection Time: 03/05/19  5:00 PM   Specimen: BLOOD  Result Value Ref Range Status   Specimen Description   Final    BLOOD BLOOD RIGHT  HAND Performed at [facility], [address], [place], [place] [address]    Special Requests   Final    BOTTLES DRAWN AEROBIC AND ANAEROBIC Blood Culture adequate volume Performed at [facility], [place] [address], [place], [place] [address]    Culture  Setup Time   Final    GRAM NEGATIVE RODS IN BOTH AEROBIC AND ANAEROBIC BOTTLES CRITICAL VALUE NOTED.  VALUE IS CONSISTENT WITH PREVIOUSLY REPORTED AND CALLED VALUE. Performed at [facility], [address] [place], [place] [address]    Culture GRAM NEGATIVE RODS  Final   Report Status PENDING  Incomplete  Blood Culture (routine x 2)     Status: Abnormal (Preliminary result)   Collection Time: 03/05/19  5:02 PM   Specimen: BLOOD LEFT FOREARM  Result Value Ref Range Status   Specimen Description BLOOD LEFT FOREARM  Final   Special Requests   Final    BOTTLES DRAWN AEROBIC AND ANAEROBIC Blood Culture adequate volume   Culture  Setup Time   Final    GRAM NEGATIVE RODS IN BOTH AEROBIC AND ANAEROBIC BOTTLES CRITICAL RESULT CALLED TO, READ BACK BY AND VERIFIED WITHHart Robinsons [facility] [id number] 03/06/2019 HNM Performed at [facility], [place] [address], [place], [place] [address]    Culture ESCHERICHIA COLI (A)  Final   Report Status PENDING  Incomplete  Blood Culture ID Panel (Reflexed)     Status: Abnormal   Collection Time: 03/05/19  5:02 PM  Result Value Ref Range Status   Enterococcus species NOT DETECTED NOT DETECTED Final   Listeria monocytogenes NOT DETECTED NOT DETECTED Final   Staphylococcus species NOT DETECTED NOT DETECTED Final   Staphylococcus aureus (BCID) NOT DETECTED NOT DETECTED Final   Streptococcus species NOT DETECTED NOT DETECTED Final   Streptococcus agalactiae NOT DETECTED NOT DETECTED Final   Streptococcus pneumoniae NOT DETECTED NOT DETECTED Final   Streptococcus pyogenes NOT DETECTED NOT DETECTED Final   Acinetobacter baumannii NOT DETECTED NOT DETECTED Final   Enterobacteriaceae  species DETECTED (A) NOT DETECTED Final    Comment: Enterobacteriaceae represent a large family of gram-negative bacteria, not a single organism. CRITICAL RESULT CALLED TO, READ BACK BY AND VERIFIED WITH: Hart Robinsons PHARMD [id number] 03/06/2019 HNM    Enterobacter  cloacae complex NOT DETECTED NOT DETECTED Final   Escherichia coli DETECTED (A) NOT DETECTED Final    Comment: CRITICAL RESULT CALLED TO, READ BACK BY AND VERIFIED WITH: Hart Robinsons PHARMD [id number] 03/06/2019 HNM    Klebsiella oxytoca NOT DETECTED NOT DETECTED Final   Klebsiella pneumoniae NOT DETECTED NOT DETECTED Final   Proteus species NOT DETECTED NOT DETECTED Final   Serratia marcescens NOT DETECTED NOT DETECTED Final   Carbapenem resistance NOT DETECTED NOT DETECTED Final   Haemophilus influenzae NOT DETECTED NOT DETECTED Final   Neisseria meningitidis NOT DETECTED NOT DETECTED Final   Pseudomonas aeruginosa NOT DETECTED NOT DETECTED Final   Candida albicans NOT DETECTED NOT DETECTED Final   Candida glabrata NOT DETECTED NOT DETECTED Final   Candida krusei NOT DETECTED NOT DETECTED Final   Candida parapsilosis NOT DETECTED NOT DETECTED Final   Candida tropicalis NOT DETECTED NOT DETECTED Final    Comment: Performed at [facility], [address] [place], [place] [address]  SARS Coronavirus 2 by RT PCR (hospital order, performed in [facility] hospital lab) Nasopharyngeal Nasopharyngeal Swab     Status: None   Collection Time: 03/05/19  6:07 PM   Specimen: Nasopharyngeal Swab  Result Value Ref Range Status   SARS Coronavirus 2 NEGATIVE NEGATIVE Final    Comment: (NOTE) SARS-CoV-2 target nucleic acids are NOT DETECTED. The SARS-CoV-2 RNA is generally detectable in upper and lower respiratory specimens during the acute phase of infection. The lowest concentration of SARS-CoV-2 viral copies this assay can detect is 250 copies / mL. A negative result does not preclude SARS-CoV-2 infection and should not be used as the  sole basis for treatment or other patient management decisions.  A negative result may occur with improper specimen collection / handling, submission of specimen other than nasopharyngeal swab, presence of viral mutation(s) within the areas targeted by this assay, and inadequate number of viral copies (<250 copies / mL). A negative result must be combined with clinical observations, patient history, and epidemiological information. Fact Sheet for Patients:   StrictlyIdeas.no Fact Sheet for Healthcare Providers: BankingDealers.co.za This test is not yet approved or cleared  by the [place] FDA and has been authorized for detection and/or diagnosis of SARS-CoV-2 by FDA under an Emergency Use Authorization (EUA).  This EUA will remain in effect (meaning this test can be used) for the duration of the COVID-19 declaration under Section 564(b)(1) of the Act, 21 U.S.C. section 360bbb-3(b)(1), unless the authorization is terminated or revoked sooner. Performed at [facility], [address] [place], [place] [address]   MRSA PCR Screening     Status: None   Collection Time: 03/05/19 10:52 PM   Specimen: Nasal Mucosa; Nasopharyngeal  Result Value Ref Range Status   MRSA by PCR NEGATIVE NEGATIVE Final    Comment:        The GeneXpert MRSA Assay (FDA approved for NASAL specimens only), is one component of a comprehensive MRSA colonization surveillance program. It is not intended to diagnose MRSA infection nor to guide or monitor treatment for MRSA infections. Performed at [facility], [address] [place], [place] [address]      Radiology Studies: Dg Chest 2 View  Result Date: 03/07/2019 CLINICAL DATA:  Sepsis, fever, hypoxia and shortness of breath. EXAM: CHEST - 2 VIEW COMPARISON:  03/06/2019 FINDINGS: The heart size and mediastinal contours are within normal limits. Stable mild interstitial prominence in  both lungs without evidence edema, focal airspace consolidation or pleural fluid.  The visualized skeletal structures are unremarkable. IMPRESSION: Stable mild interstitial prominence. No focal infiltrate identified. Electronically Signed   By: Aletta Edouard M.D.   On: 03/07/2019 09:04   Dg Abd 1 View  Result Date: 03/06/2019 CLINICAL DATA:  Abdominal distension EXAM: ABDOMEN - 1 VIEW COMPARISON:  12/05/2008 FINDINGS: Scattered large and small bowel gas is noted. No obstructive changes are seen. No free intraperitoneal air is noted. Degenerative changes of lumbar spine are seen. IMPRESSION: Scattered bowel gas without obstructive change. Electronically Signed   By: Inez Catalina M.D.   On: 03/06/2019 00:39   Ct Head Wo Contrast  Result Date: 03/05/2019 CLINICAL DATA:  Head trauma, multiple falls at home, incontinent, cervical spine trauma high clinical risk EXAM: CT HEAD WITHOUT CONTRAST CT CERVICAL SPINE WITHOUT CONTRAST TECHNIQUE: Multidetector CT imaging of the head and cervical spine was performed following the standard protocol without intravenous contrast. Multiplanar CT image reconstructions of the cervical spine were also generated. COMPARISON:  11/20/2018 FINDINGS: CT HEAD FINDINGS Brain: Generalized atrophy. Normal ventricular morphology. No midline shift or mass effect. Small vessel chronic ischemic changes of deep cerebral white matter. Otherwise normal appearance of brain parenchyma. No intracranial hemorrhage, mass lesion or evidence of acute infarction. No extra-axial fluid collections. Vascular: No hyperdense vessels. Skull: Intact Sinuses/Orbits: Clear Other: N/A CT CERVICAL SPINE FINDINGS Alignment: Minimal retrolisthesis C4-C5. Remaining alignments normal Skull base and vertebrae: Osseous demineralization. Multilevel facet degenerative changes. Multilevel disc space narrowing and endplate spur formation diffusely throughout cervical spine. Vertebral body heights maintained. No fracture,  additional subluxation, or bone destruction. Minimal scattered motion artifacts. Soft tissues and spinal canal: Prevertebral soft tissues normal thickness. Disc levels: Endplate spurs encroach upon the anterior aspect of the spinal canal and thecal sac at multiple levels. Upper chest: Lung apices clear Other: N/A IMPRESSION: Atrophy with minimal small vessel chronic ischemic changes of deep cerebral white matter. No acute intracranial abnormalities. Degenerative disc and facet disease changes throughout cervical spine as above. No acute cervical spine abnormalities. Electronically Signed   By: Lavonia Dana M.D.   On: 03/05/2019 17:26   Ct Cervical Spine Wo Contrast  Result Date: 03/05/2019 CLINICAL DATA:  Head trauma, multiple falls at home, incontinent, cervical spine trauma high clinical risk EXAM: CT HEAD WITHOUT CONTRAST CT CERVICAL SPINE WITHOUT CONTRAST TECHNIQUE: Multidetector CT imaging of the head and cervical spine was performed following the standard protocol without intravenous contrast. Multiplanar CT image reconstructions of the cervical spine were also generated. COMPARISON:  11/20/2018 FINDINGS: CT HEAD FINDINGS Brain: Generalized atrophy. Normal ventricular morphology. No midline shift or mass effect. Small vessel chronic ischemic changes of deep cerebral white matter. Otherwise normal appearance of brain parenchyma. No intracranial hemorrhage, mass lesion or evidence of acute infarction. No extra-axial fluid collections. Vascular: No hyperdense vessels. Skull: Intact Sinuses/Orbits: Clear Other: N/A CT CERVICAL SPINE FINDINGS Alignment: Minimal retrolisthesis C4-C5. Remaining alignments normal Skull base and vertebrae: Osseous demineralization. Multilevel facet degenerative changes. Multilevel disc space narrowing and endplate spur formation diffusely throughout cervical spine. Vertebral body heights maintained. No fracture, additional subluxation, or bone destruction. Minimal scattered motion  artifacts. Soft tissues and spinal canal: Prevertebral soft tissues normal thickness. Disc levels: Endplate spurs encroach upon the anterior aspect of the spinal canal and thecal sac at multiple levels. Upper chest: Lung apices clear Other: N/A IMPRESSION: Atrophy with minimal small vessel chronic ischemic changes of deep cerebral white matter. No acute intracranial abnormalities. Degenerative disc and facet disease changes throughout cervical spine as above. No acute  cervical spine abnormalities. Electronically Signed   By: Lavonia Dana M.D.   On: 03/05/2019 17:26   Dg Chest Port 1 View  Result Date: 03/06/2019 CLINICAL DATA:  Dyspnea. EXAM: PORTABLE CHEST 1 VIEW COMPARISON:  03/06/2019 12:09 a.m. FINDINGS: Lungs are adequately inflated without focal airspace consolidation or effusion. Cardiomediastinal silhouette and remainder of the exam is unchanged. IMPRESSION: No active disease. Electronically Signed   By: Marin Olp M.D.   On: 03/06/2019 10:08   Dg Chest Port 1 View  Result Date: 03/06/2019 CLINICAL DATA:  Hypoxia EXAM: PORTABLE CHEST 1 VIEW COMPARISON:  Film from the previous day FINDINGS: Cardiac shadow is mildly enlarged but stable. Tortuosity of the thoracic aorta is noted accentuated by patient rotation to the right. The lungs are well aerated bilaterally. Slight increased interstitial changes are noted likely related to a degree of congestive failure. No sizable effusion is noted. No focal infiltrate is seen. No bony abnormality is noted. IMPRESSION: Increased interstitial changes consistent with a degree of congestive heart failure. Electronically Signed   By: Inez Catalina M.D.   On: 03/06/2019 00:37   Dg Chest Port 1 View  Result Date: 03/05/2019 CLINICAL DATA:  Fever. EXAM: PORTABLE CHEST 1 VIEW COMPARISON:  Chest x-ray dated 06/22/2014 FINDINGS: The heart size and mediastinal contours are within normal limits. Both lungs are clear. No acute bone abnormality. Degenerative changes of  both shoulders. IMPRESSION: No active disease in the chest. Electronically Signed   By: Lorriane Shire M.D.   On: 03/05/2019 17:31    Scheduled Meds:  Chlorhexidine Gluconate Cloth  6 each Topical Q0600   enoxaparin (LOVENOX) injection  40 mg Subcutaneous Q24H   insulin aspart  0-9 Units Subcutaneous Q4H   ipratropium-albuterol  3 mL Nebulization Q6H   mouth rinse  15 mL Mouth Rinse BID   rosuvastatin  40 mg Oral q1800   Continuous Infusions:  sodium chloride 200 mL/hr at 03/07/19 1133   meropenem (MERREM) IV Stopped (03/07/19 0600)     LOS: 2 days   Time spent: 45 minutes.  Lorella Nimrod, MD [facility] Pager [phone]  This record has been created using Dragon voice recognition software. Errors have been sought and corrected,but may not always be located. Such creation errors do not reflect on the standard of care.  If 7PM-7AM, please contact night-coverage [email or web address] Password [facility] 03/07/2019, 12:52 PM   This record has been created using Systems analyst. Errors have been sought and corrected,but may not always be located. Such creation errors do not reflect on the standard of care.

## 2019-03-07 NOTE — Evaluation (Signed)
SLP Cancellation Note  Patient Details Name: Carl Bryan MRN: [id number] DOB: [birth year]   Cancelled treatment:       Reason Eval/Treat Not Completed: SLP screened, no needs identified, will sign off  NSG reports no difficulties with speech or swallow. Pt screened, had no overt ssx aspiration when he drank thins via straw and O2 remained 96-98 during intake. Pt reports having no difficulty with speech or swallow. ST educated pt and nsg to notify st if changes in speech or swallow occur. ST will sign off at this time, but will reassess if necessary.    Tomie China 03/07/2019, 9:54 AM

## 2019-03-07 NOTE — TOC Initial Note (Signed)
Transition of Care [facility]) - Initial/Assessment Note    Patient Details  Name: GERMANY CHELF MRN: [id number] Date of Birth: [birth year]  Transition of Care [facility]) CM/SW Contact:    Su Hilt, RN Phone Number: 03/07/2019, 3:17 PM  Clinical Narrative:                 Mdt with the patient to discuss DC plan and needs, He lives at home with his wife He has DME at home including a walker, crutches, a wheelchair He has used PT HH in the past and is not interested in doing it again He stated he has no needs        Patient Goals and CMS Choice        Expected Discharge Plan and Services                                                Prior Living Arrangements/Services                       Activities of Daily Living      Permission Sought/Granted                  Emotional Assessment              Admission diagnosis:  Multiple falls [R29.6] Urinary tract infection with hematuria, site unspecified [N39.0, R31.9] Sepsis, due to unspecified organism, unspecified whether acute organ dysfunction present [facility]) [A41.9] Patient Active Problem List   Diagnosis Date Noted  . Urinary tract infection with hematuria   . Multiple falls   . Hypoxia   . Sepsis ([place]) 03/05/2019  . Acute lower UTI 03/05/2019  . Acute metabolic encephalopathy 88/41/6606  . Elevated troponin 03/05/2019  . Rhabdomyolysis 03/05/2019  . Hypokalemia 03/05/2019  . Hyponatremia 03/05/2019  . Wound of left ankle 09/10/2018  . Skin lesions 03/28/2018  . Deafness in right ear 03/28/2018  . Arthritis of knee, left 09/26/2017  . Advance care planning 08/03/2016  . Gout 07/06/2015  . Pure hypercholesterolemia 09/29/2014  . Diabetes mellitus associated with hormonal etiology ([place]) 09/29/2014  . Traumatic amputation of right leg above knee ([place]) 09/29/2014  . Essential (primary) hypertension 09/29/2014  . CAD (coronary artery disease) 09/29/2014  . Diverticulosis    PCP:   Volney American, PA-C Pharmacy:   [place], [place] [address] Phone: [phone] Fax: [phone]  [facility] [address], [place] [address] [place] [address] [place] [address] [place] [address] [place] [address] Phone: [phone] Fax: [phone]     Social Determinants of Health (SDOH) Interventions    Readmission Risk Interventions No flowsheet data found.

## 2019-03-08 ENCOUNTER — Inpatient Hospital Stay: Payer: Medicare HMO

## 2019-03-08 LAB — BLOOD GAS, ARTERIAL
Acid-base deficit: 2.4 mmol/L — ABNORMAL HIGH (ref 0.0–2.0)
Allens test (pass/fail): POSITIVE — AB
Bicarbonate: 21.6 mmol/L (ref 20.0–28.0)
FIO2: 36
O2 Saturation: 94.6 %
Patient temperature: 37
pCO2 arterial: 34 mmHg (ref 32.0–48.0)
pH, Arterial: 7.41 (ref 7.350–7.450)
pO2, Arterial: 73 mmHg — ABNORMAL LOW (ref 83.0–108.0)

## 2019-03-08 LAB — CULTURE, BLOOD (ROUTINE X 2)
Special Requests: ADEQUATE
Special Requests: ADEQUATE

## 2019-03-08 LAB — URINALYSIS, COMPLETE (UACMP) WITH MICROSCOPIC
Bilirubin Urine: NEGATIVE
Glucose, UA: 50 mg/dL — AB
Ketones, ur: 20 mg/dL — AB
Nitrite: NEGATIVE
Protein, ur: 100 mg/dL — AB
Specific Gravity, Urine: 1.011 (ref 1.005–1.030)
pH: 6 (ref 5.0–8.0)

## 2019-03-08 LAB — COMPREHENSIVE METABOLIC PANEL
ALT: 22 U/L (ref 0–44)
AST: 23 U/L (ref 15–41)
Albumin: 2.6 g/dL — ABNORMAL LOW (ref 3.5–5.0)
Alkaline Phosphatase: 56 U/L (ref 38–126)
Anion gap: 13 (ref 5–15)
BUN: 21 mg/dL (ref 8–23)
CO2: 17 mmol/L — ABNORMAL LOW (ref 22–32)
Calcium: 8.6 mg/dL — ABNORMAL LOW (ref 8.9–10.3)
Chloride: 101 mmol/L (ref 98–111)
Creatinine, Ser: 1.16 mg/dL (ref 0.61–1.24)
GFR calc Af Amer: >60 mL/min (ref >60)
GFR calc non Af Amer: >60 mL/min (ref >60)
Glucose, Bld: 147 mg/dL — ABNORMAL HIGH (ref 70–99)
Potassium: 3 mmol/L — ABNORMAL LOW (ref 3.5–5.1)
Sodium: 131 mmol/L — ABNORMAL LOW (ref 135–145)
Total Bilirubin: 1.4 mg/dL — ABNORMAL HIGH (ref 0.3–1.2)
Total Protein: 5.6 g/dL — ABNORMAL LOW (ref 6.5–8.1)

## 2019-03-08 LAB — CBC
HCT: 33.7 % — ABNORMAL LOW (ref 39.0–52.0)
Hemoglobin: 12.1 g/dL — ABNORMAL LOW (ref 13.0–17.0)
MCH: 31.8 pg (ref 26.0–34.0)
MCHC: 35.9 g/dL (ref 30.0–36.0)
MCV: 88.7 fL (ref 80.0–100.0)
Platelets: 140 10*3/uL — ABNORMAL LOW (ref 150–400)
RBC: 3.8 MIL/uL — ABNORMAL LOW (ref 4.22–5.81)
RDW: 13.6 % (ref 11.5–15.5)
WBC: 9.7 10*3/uL (ref 4.0–10.5)
nRBC: 0 % (ref 0.0–0.2)

## 2019-03-08 LAB — CHLORIDE, URINE, RANDOM: Chloride Urine: 51 mmol/L

## 2019-03-08 LAB — NA AND K (SODIUM & POTASSIUM), RAND UR
Potassium Urine: 16 mmol/L
Sodium, Ur: 52 mmol/L

## 2019-03-08 LAB — LACTIC ACID, PLASMA: Lactic Acid, Venous: 0.8 mmol/L (ref 0.5–1.9)

## 2019-03-08 LAB — MAGNESIUM: Magnesium: 2 mg/dL (ref 1.7–2.4)

## 2019-03-08 LAB — GLUCOSE, CAPILLARY
Glucose-Capillary: 142 mg/dL — ABNORMAL HIGH (ref 70–99)
Glucose-Capillary: 154 mg/dL — ABNORMAL HIGH (ref 70–99)
Glucose-Capillary: 181 mg/dL — ABNORMAL HIGH (ref 70–99)

## 2019-03-08 LAB — PHOSPHORUS: Phosphorus: 1.8 mg/dL — ABNORMAL LOW (ref 2.5–4.6)

## 2019-03-08 MED ORDER — POTASSIUM CHLORIDE 10 MEQ/100ML IV SOLN
10.0000 meq | INTRAVENOUS | Status: DC
  Administered 2019-03-08: 10 meq via INTRAVENOUS
  Filled 2019-03-08: qty 100

## 2019-03-08 MED ORDER — IPRATROPIUM-ALBUTEROL 0.5-2.5 (3) MG/3ML IN SOLN
3.0000 mL | Freq: Three times a day (TID) | RESPIRATORY_TRACT | Status: DC
  Administered 2019-03-09 – 2019-03-10 (×4): 3 mL via RESPIRATORY_TRACT
  Filled 2019-03-08 (×4): qty 3

## 2019-03-08 MED ORDER — SODIUM PHOSPHATES 45 MMOLE/15ML IV SOLN
20.0000 mmol | Freq: Once | INTRAVENOUS | Status: AC
  Administered 2019-03-08: 20 mmol via INTRAVENOUS
  Filled 2019-03-08: qty 6.67

## 2019-03-08 MED ORDER — POTASSIUM CHLORIDE 10 MEQ/100ML IV SOLN
10.0000 meq | INTRAVENOUS | Status: AC
  Administered 2019-03-08 – 2019-03-09 (×3): 10 meq via INTRAVENOUS
  Filled 2019-03-08 (×3): qty 100

## 2019-03-08 MED ORDER — POTASSIUM CHLORIDE CRYS ER 20 MEQ PO TBCR: 40.0000 meq | EXTENDED_RELEASE_TABLET | Freq: Once | ORAL | Status: DC

## 2019-03-08 MED ORDER — K PHOS MONO-SOD PHOS DI & MONO 155-852-130 MG PO TABS
500.0000 mg | ORAL_TABLET | Freq: Two times a day (BID) | ORAL | Status: AC
  Administered 2019-03-08 (×2): 500 mg via ORAL
  Filled 2019-03-08 (×2): qty 2

## 2019-03-08 MED ORDER — STERILE WATER FOR INJECTION IV SOLN
INTRAVENOUS | Status: DC
  Administered 2019-03-08 – 2019-03-09 (×2): via INTRAVENOUS
  Filled 2019-03-08 (×3): qty 850

## 2019-03-08 MED ORDER — SODIUM CHLORIDE 0.9 % IV SOLN
INTRAVENOUS | Status: DC
  Administered 2019-03-08: 14:00:00 via INTRAVENOUS

## 2019-03-08 MED ORDER — POTASSIUM CHLORIDE CRYS ER 20 MEQ PO TBCR
40.0000 meq | EXTENDED_RELEASE_TABLET | Freq: Two times a day (BID) | ORAL | Status: AC
  Administered 2019-03-08 (×2): 40 meq via ORAL
  Filled 2019-03-08 (×2): qty 2

## 2019-03-08 MED ORDER — SODIUM PHOSPHATES 45 MMOLE/15ML IV SOLN
30.0000 mmol | Freq: Once | INTRAVENOUS | Status: DC
  Filled 2019-03-08: qty 10

## 2019-03-08 NOTE — Progress Notes (Signed)
CBG of 143.

## 2019-03-08 NOTE — Progress Notes (Signed)
PROGRESS NOTE    DENO SIDA  WIO:[id number] DOB: [birth year] DOA: 03/05/2019 PCP: Volney American, PA-C   Brief Narrative:  Carl Bryan is a 73 y.o. male with medical history significant of dm2, HTN, HLD Status post traumatic amputation  right AKA Presented with falls at home has been very weak has been having multiple falls for the past few days. On arrival to ED he met sepsis criteria, febrile at 101, tachycardic, tachypneic and hypotensive.  He was initially started on cefepime and vancomycin and was given IV fluid according to sepsis protocol. Urine and blood cultures are growing E. Coli-antibiotics switched to meropenem, later switched to cefazolin according to sensitivity report.  Subjective: Patient was feeling better when seen during morning rounds.  He denies any chronic oxygen use at home or CPAP use for obstructive sleep apnea.  He denies any upper respiratory symptoms, chest pain or shortness of breath.  Assessment & Plan:   Active Problems:   Pure hypercholesterolemia   Diabetes mellitus associated with hormonal etiology ([place])   Traumatic amputation of right leg above knee (HCC)   Essential (primary) hypertension   CAD (coronary artery disease)   Sepsis ([place])   Acute lower UTI   Acute metabolic encephalopathy   Elevated troponin   Rhabdomyolysis   Hypokalemia   Hyponatremia   Urinary tract infection with hematuria   Multiple falls   Hypoxia  Sepsis secondary to UTI.  Both urine and blood cultures are growing E. Coli with good sensitivity. Initial improvement in labs which includes lactic acid and electrolytes. Procalcitonin elevated at 2.98. Continue to have low-grade fever. Continue to require oxygen. -Place meropenem with cefazolin according to sensitivity report.  Patient will need 2 weeks of antibiotics. -Continue monitoring. -Repeat blood culture.  Acute hypoxic respiratory failure.  Patient continued to have increasing oxygen requirement.  Currently  saturating in mid 90s on 3 L. Repeated two-view chest x-ray this morning which was negative for any infiltrate or pulmonary edema. Wells score for PE was 1.5 which shows low risk but Geneva modified score was 6 which is consistent with moderate risk of PE.  D-dimer elevated above 2500, CTA and lower extremity Dopplers are negative for any PE or DVT. D-dimer can be elevated with sepsis. Hypoxia can be due to sepsis. ABG shows normal pH with PO2 of 73 on 5 L.  Normal anion gap metabolic acidosis with hypokalemia and hyponatremia. Patient started developing worsening acidosis with decreased in bicarb and becoming hypokalemic and hyponatremic despite replacing electrolytes. Urine anion gap is positive at 17 which points towards RTA type I or II. most likely type I(distal) as urine pH is 6. Patient does not has any known autoimmune illness, selective uropathy can be a cause-we will order renal CT stone studies to rule out that. -Nephrology was consulted-appreciate their recommendations.  Elevated troponin.  History of CAD.  Most likely demand.  No chest pain.  Diabetes.  Uncontrolled as A1c is 8.0. -Continue CBG monitoring with SSI.  Rhabdomyolysis.  Most likely due to fever.  CK within normal limit now. -Continue IV hydration and monitor.  Acute encephalopathy.  Most likely metabolic secondary to bacteremia.  Resolved and patient was alert and oriented.  Hypertension.  Currently softer blood pressure. -Keep holding home meds.  Traumatic amputation of right leg above knee ([place]). Chronic and stable.  Electrolyte abnormalities.  Secondary to sepsis.  Hypokalemia and mild hyponatremia today.  RTA can be a possibility. -Continue to monitor-replete as needed.  Dyslipidemia. -Continue  home meds.  Objective: Vitals:   03/07/19 1924 03/08/19 0549 03/08/19 0741 03/08/19 0819  BP:  138/74  (!) 101/40  Pulse:  87  84  Resp:      Temp:  (!) 100.9 F (38.3 C)  98 F (36.7 C)  TempSrc:  Oral   Oral  SpO2: 95% 96% 94% 96%  Weight:      Height:        Intake/Output Summary (Last 24 hours) at 03/08/2019 1414 Last data filed at 03/08/2019 0458 Gross per 24 hour  Intake 895.5 ml  Output 2025 ml  Net -1129.5 ml   Filed Weights   03/05/19 1951 03/05/19 2350 03/07/19 1600  Weight: 78 kg 71.2 kg 75.8 kg    Examination:  General exam: Appears calm and comfortable.  Respiratory system: Clear to auscultation. Respiratory effort normal, mild tachypnea. Cardiovascular system: Regular rate and rhythm, no JVD, murmurs, rubs, gallops or clicks. No pedal edema. Gastrointestinal system: Abdomen is nondistended, soft and nontender. No organomegaly or masses felt. Normal bowel sounds heard. Central nervous system: Alert and oriented. No focal neurological deficits. Extremities: Right AKA. Skin: No rashes, lesions or ulcers Psychiatry: Judgement and insight appear normal. Mood & affect appropriate.   DVT prophylaxis: Lovenox Code Status: Full Family Communication:  Wife was updated on phone. Disposition Plan: Pending improvement.  Consultants:  PCCM Nephrology  Procedures:   Antimicrobials:  Meropenem.  Started on 11/26  Data Reviewed: I have personally reviewed following labs and imaging studies  CBC: Recent Labs  Lab 03/05/19 1659 03/06/19 0419 03/07/19 0552 03/08/19 0420  WBC 13.6* 17.7* 11.3* 9.7  NEUTROABS 12.3*  --   --   --   HGB 15.5 13.2 12.4* 12.1*  HCT 44.6 38.7* 35.4* 33.7*  MCV 91.4 91.9 89.2 88.7  PLT 173 154 137* 585*   Basic Metabolic Panel: Recent Labs  Lab 03/05/19 1659 03/05/19 2159 03/06/19 0419 03/07/19 0552 03/08/19 0420  NA 130* 131* 135 133* 131*  K 3.5 3.3* 3.5 3.2* 3.0*  CL 98 101 105 103 101  CO2 15* 20* 21* 18* 17*  GLUCOSE 337* 291* 168* 139* 147*  BUN 21 22 25* 25* 21  CREATININE 1.19 1.22 1.24 1.26* 1.16  CALCIUM 9.8 9.0 9.0 8.7* 8.6*  MG  --   --  2.0 2.0 2.0  PHOS  --   --  3.1  --  1.8*   GFR: Estimated Creatinine  Clearance: 50.4 mL/min (by C-G formula based on SCr of 1.16 mg/dL). Liver Function Tests: Recent Labs  Lab 03/05/19 1659 03/06/19 0419 03/07/19 0552 03/08/19 0420  AST _0 ALT _1 ALKPHOS 104 54 59 56  BILITOT 3.0* 1.5* 1.7* 1.4*  PROT 7.7 6.1* 5.9* 5.6*  ALBUMIN 4.1 3.0* 2.7* 2.6*   No results for input(s): LIPASE, AMYLASE in the last 168 hours. Recent Labs  Lab 03/05/19 2159  AMMONIA 21   Coagulation Profile: Recent Labs  Lab 03/05/19 1659  INR 1.2   Cardiac Enzymes: Recent Labs  Lab 03/05/19 2159 03/06/19 0419 03/07/19 0552  CKTOTAL 1,141* 725* 226   BNP (last 3 results) No results for input(s): PROBNP in the last 8760 hours. HbA1C: Recent Labs    03/06/19 0419  HGBA1C 8.0*   CBG: Recent Labs  Lab 03/07/19 0750 03/07/19 1134 03/07/19 1539 03/08/19 0745 03/08/19 1222  GLUCAP 120* 133* 124* 142* 154*   Lipid Profile: No results for input(s): CHOL, HDL, LDLCALC, TRIG, CHOLHDL, LDLDIRECT  in the last 72 hours. Thyroid Function Tests: Recent Labs    03/06/19 0419  TSH 1.393   Anemia Panel: No results for input(s): VITAMINB12, FOLATE, FERRITIN, TIBC, IRON, RETICCTPCT in the last 72 hours. Sepsis Labs: Recent Labs  Lab 03/05/19 1659 03/05/19 2220 03/08/19 1115  PROCALCITON 2.98  --   --   LATICACIDVEN 3.6* 1.4 0.8    Recent Results (from the past 240 hour(s))  Urine culture     Status: Abnormal   Collection Time: 03/05/19  4:46 PM   Specimen: In/Out Cath Urine  Result Value Ref Range Status   Specimen Description   Final    IN/OUT CATH URINE Performed at [facility], [address] [place], [place] [address]    Special Requests   Final    NONE Performed at [facility], [address] [place], [place] [address]    Culture 40,000 COLONIES/mL ESCHERICHIA COLI (A)  Final   Report Status 03/07/2019 FINAL  Final   Organism ID, Bacteria ESCHERICHIA COLI (A)  Final      Susceptibility   Escherichia  coli - MIC*    AMPICILLIN >=32 RESISTANT Resistant     CEFAZOLIN <=4 SENSITIVE Sensitive     CEFTRIAXONE <=1 SENSITIVE Sensitive     CIPROFLOXACIN >=4 RESISTANT Resistant     GENTAMICIN <=1 SENSITIVE Sensitive     IMIPENEM <=0.25 SENSITIVE Sensitive     NITROFURANTOIN <=16 SENSITIVE Sensitive     TRIMETH/SULFA <=20 SENSITIVE Sensitive     AMPICILLIN/SULBACTAM 16 INTERMEDIATE Intermediate     PIP/TAZO <=4 SENSITIVE Sensitive     Extended ESBL NEGATIVE Sensitive     * 40,000 COLONIES/mL ESCHERICHIA COLI  Blood Culture (routine x 2)     Status: Abnormal   Collection Time: 03/05/19  5:00 PM   Specimen: BLOOD  Result Value Ref Range Status   Specimen Description   Final    BLOOD BLOOD RIGHT HAND Performed at [facility], [place] [address] [place], [place] [address]    Special Requests   Final    BOTTLES DRAWN AEROBIC AND ANAEROBIC Blood Culture adequate volume Performed at [facility], [place] [address] [place], [place] [address]    Culture  Setup Time   Final    GRAM NEGATIVE RODS IN BOTH AEROBIC AND ANAEROBIC BOTTLES CRITICAL VALUE NOTED.  VALUE IS CONSISTENT WITH PREVIOUSLY REPORTED AND CALLED VALUE. Performed at [facility], [address] [place], [place] [address]    Culture (A)  Final    ESCHERICHIA COLI SUSCEPTIBILITIES PERFORMED ON PREVIOUS CULTURE WITHIN THE LAST 5 DAYS. Performed at [facility], [address], [place], [place] [address]    Report Status 03/08/2019 FINAL  Final  Blood Culture (routine x 2)     Status: Abnormal   Collection Time: 03/05/19  5:02 PM   Specimen: BLOOD LEFT FOREARM  Result Value Ref Range Status   Specimen Description BLOOD LEFT FOREARM  Final   Special Requests   Final    BOTTLES DRAWN AEROBIC AND ANAEROBIC Blood Culture adequate volume   Culture  Setup Time   Final    GRAM NEGATIVE RODS IN BOTH AEROBIC AND ANAEROBIC BOTTLES CRITICAL RESULT CALLED TO, READ BACK BY AND VERIFIED WITHHart Robinsons [facility] 1856  03/06/2019 HNM Performed at [facility], [place] [address], [place],  [address]    Culture ESCHERICHIA COLI (A)  Final   Report Status 03/08/2019 FINAL  Final  Organism ID, Bacteria ESCHERICHIA COLI  Final      Susceptibility   Escherichia coli - MIC*    AMPICILLIN >=32 RESISTANT Resistant     CEFAZOLIN <=4 SENSITIVE Sensitive     CEFEPIME <=1 SENSITIVE Sensitive     CEFTAZIDIME <=1 SENSITIVE Sensitive     CEFTRIAXONE <=1 SENSITIVE Sensitive     CIPROFLOXACIN >=4 RESISTANT Resistant     GENTAMICIN <=1 SENSITIVE Sensitive     IMIPENEM <=0.25 SENSITIVE Sensitive     TRIMETH/SULFA <=20 SENSITIVE Sensitive     AMPICILLIN/SULBACTAM 16 INTERMEDIATE Intermediate     PIP/TAZO <=4 SENSITIVE Sensitive     Extended ESBL NEGATIVE Sensitive     * ESCHERICHIA COLI  Blood Culture ID Panel (Reflexed)     Status: Abnormal   Collection Time: 03/05/19  5:02 PM  Result Value Ref Range Status   Enterococcus species NOT DETECTED NOT DETECTED Final   Listeria monocytogenes NOT DETECTED NOT DETECTED Final   Staphylococcus species NOT DETECTED NOT DETECTED Final   Staphylococcus aureus (BCID) NOT DETECTED NOT DETECTED Final   Streptococcus species NOT DETECTED NOT DETECTED Final   Streptococcus agalactiae NOT DETECTED NOT DETECTED Final   Streptococcus pneumoniae NOT DETECTED NOT DETECTED Final   Streptococcus pyogenes NOT DETECTED NOT DETECTED Final   Acinetobacter baumannii NOT DETECTED NOT DETECTED Final   Enterobacteriaceae species DETECTED (A) NOT DETECTED Final    Comment: Enterobacteriaceae represent a large family of gram-negative bacteria, not a single organism. CRITICAL RESULT CALLED TO, READ BACK BY AND VERIFIED WITH: SCOTT HALL PHARMD [id number] 03/06/2019 HNM    Enterobacter cloacae complex NOT DETECTED NOT DETECTED Final   Escherichia coli DETECTED (A) NOT DETECTED Final    Comment: CRITICAL RESULT CALLED TO, READ BACK BY AND VERIFIED WITH: Hart Robinsons PHARMD [id number] 03/06/2019 HNM     Klebsiella oxytoca NOT DETECTED NOT DETECTED Final   Klebsiella pneumoniae NOT DETECTED NOT DETECTED Final   Proteus species NOT DETECTED NOT DETECTED Final   Serratia marcescens NOT DETECTED NOT DETECTED Final   Carbapenem resistance NOT DETECTED NOT DETECTED Final   Haemophilus influenzae NOT DETECTED NOT DETECTED Final   Neisseria meningitidis NOT DETECTED NOT DETECTED Final   Pseudomonas aeruginosa NOT DETECTED NOT DETECTED Final   Candida albicans NOT DETECTED NOT DETECTED Final   Candida glabrata NOT DETECTED NOT DETECTED Final   Candida krusei NOT DETECTED NOT DETECTED Final   Candida parapsilosis NOT DETECTED NOT DETECTED Final   Candida tropicalis NOT DETECTED NOT DETECTED Final    Comment: Performed at [facility], [address] [place], [place] [address]  SARS Coronavirus 2 by RT PCR (hospital order, performed in [facility] hospital lab) Nasopharyngeal Nasopharyngeal Swab     Status: None   Collection Time: 03/05/19  6:07 PM   Specimen: Nasopharyngeal Swab  Result Value Ref Range Status   SARS Coronavirus 2 NEGATIVE NEGATIVE Final    Comment: (NOTE) SARS-CoV-2 target nucleic acids are NOT DETECTED. The SARS-CoV-2 RNA is generally detectable in upper and lower respiratory specimens during the acute phase of infection. The lowest concentration of SARS-CoV-2 viral copies this assay can detect is 250 copies / mL. A negative result does not preclude SARS-CoV-2 infection and should not be used as the sole basis for treatment or other patient management decisions.  A negative result may occur with improper specimen collection / handling, submission of specimen other than nasopharyngeal swab, presence of viral mutation(s) within the areas targeted by this assay, and inadequate number of  viral copies (<250 copies / mL). A negative result must be combined with clinical observations, patient history, and epidemiological information. Fact Sheet for Patients:    StrictlyIdeas.no Fact Sheet for Healthcare Providers: BankingDealers.co.za This test is not yet approved or cleared  by the [place] FDA and has been authorized for detection and/or diagnosis of SARS-CoV-2 by FDA under an Emergency Use Authorization (EUA).  This EUA will remain in effect (meaning this test can be used) for the duration of the COVID-19 declaration under Section 564(b)(1) of the Act, 21 U.S.C. section 360bbb-3(b)(1), unless the authorization is terminated or revoked sooner. Performed at [facility], [address] [place], [place] [address]   MRSA PCR Screening     Status: None   Collection Time: 03/05/19 10:52 PM   Specimen: Nasal Mucosa; Nasopharyngeal  Result Value Ref Range Status   MRSA by PCR NEGATIVE NEGATIVE Final    Comment:        The GeneXpert MRSA Assay (FDA approved for NASAL specimens only), is one component of a comprehensive MRSA colonization surveillance program. It is not intended to diagnose MRSA infection nor to guide or monitor treatment for MRSA infections. Performed at [facility], [address], [place], [place] [address]      Radiology Studies: Dg Chest 2 View  Result Date: 03/07/2019 CLINICAL DATA:  Sepsis, fever, hypoxia and shortness of breath. EXAM: CHEST - 2 VIEW COMPARISON:  03/06/2019 FINDINGS: The heart size and mediastinal contours are within normal limits. Stable mild interstitial prominence in both lungs without evidence edema, focal airspace consolidation or pleural fluid. The visualized skeletal structures are unremarkable. IMPRESSION: Stable mild interstitial prominence. No focal infiltrate identified. Electronically Signed   By: Aletta Edouard M.D.   On: 03/07/2019 09:04   Ct Angio Chest Pe W Or Wo Contrast  Result Date: 03/07/2019 CLINICAL DATA:  Shortness of breath. Elevated D-dimer. EXAM: CT ANGIOGRAPHY CHEST WITH CONTRAST TECHNIQUE:  Multidetector CT imaging of the chest was performed using the standard protocol during bolus administration of intravenous contrast. Multiplanar CT image reconstructions and MIPs were obtained to evaluate the vascular anatomy. CONTRAST:  70m OMNIPAQUE IOHEXOL 350 MG/ML SOLN COMPARISON:  Chest x-ray dated 03/07/2019 FINDINGS: Cardiovascular: Satisfactory opacification of the pulmonary arteries to the segmental level. No evidence of pulmonary embolism. Normal heart size. No pericardial effusion. Mediastinum/Nodes: No enlarged mediastinal, hilar, or axillary lymph nodes. Thyroid gland, trachea, and esophagus demonstrate no significant findings. Lungs/Pleura: Tiny intrapulmonary node along the minor fissure on the right of no significance. Peripheral atelectasis at the right lung base posteriorly. No effusions. Upper Abdomen: Negative. Musculoskeletal: No chest wall abnormality. No acute or significant osseous findings. Review of the MIP images confirms the above findings. IMPRESSION: No pulmonary emboli. Slight atelectasis at the right lung base posteriorly. Electronically Signed   By: JLorriane ShireM.D.   On: 03/07/2019 14:55   UKoreaVenous Img Lower Bilateral (dvt)  Result Date: 03/07/2019 CLINICAL DATA:  73year old with hypoxia, shortness of breath and elevated D-dimer. Above the knee amputation in the right lower extremity. EXAM: BILATERAL LOWER EXTREMITY VENOUS DOPPLER ULTRASOUND TECHNIQUE: Gray-scale sonography with graded compression, as well as color Doppler and duplex ultrasound were performed to evaluate the lower extremity deep venous systems from the level of the common femoral vein and including the common femoral, femoral, profunda femoral, popliteal and calf veins including the posterior tibial, peroneal and gastrocnemius veins when visible. The superficial great saphenous vein was also interrogated. Spectral Doppler was utilized to evaluate  flow at rest and with distal augmentation maneuvers in the  common femoral, femoral and popliteal veins. COMPARISON:  None. FINDINGS: RIGHT LOWER EXTREMITY Common Femoral Vein: No evidence of thrombus. Normal compressibility, respiratory phasicity and response to augmentation. Saphenofemoral Junction: No evidence of thrombus. Normal compressibility and flow on color Doppler imaging. Profunda Femoral Vein: No evidence of thrombus. Normal compressibility and flow on color Doppler imaging. Femoral Vein: No evidence of thrombus. Normal compressibility, respiratory phasicity and response to augmentation. Other: Above the knee amputation in the right lower extremity. Other Findings:  None. LEFT LOWER EXTREMITY Common Femoral Vein: No evidence of thrombus. Normal compressibility, respiratory phasicity and response to augmentation. Saphenofemoral Junction: No evidence of thrombus. Normal compressibility and flow on color Doppler imaging. Profunda Femoral Vein: No evidence of thrombus. Normal compressibility and flow on color Doppler imaging. Femoral Vein: No evidence of thrombus. Normal compressibility, respiratory phasicity and response to augmentation. Popliteal Vein: No evidence of thrombus. Normal compressibility, respiratory phasicity and response to augmentation. Calf Veins: No evidence of thrombus. Normal compressibility and flow on color Doppler imaging. Other Findings:  None. IMPRESSION: No evidence of deep venous thrombosis in either lower extremity. Electronically Signed   By: Markus Daft M.D.   On: 03/07/2019 15:46    Scheduled Meds: . Chlorhexidine Gluconate Cloth  6 each Topical Q0600  . enoxaparin (LOVENOX) injection  40 mg Subcutaneous Q24H  . insulin aspart  0-9 Units Subcutaneous Q4H  . ipratropium-albuterol  3 mL Nebulization Q6H  . mouth rinse  15 mL Mouth Rinse BID  . phosphorus  500 mg Oral BID  . potassium chloride  40 mEq Oral BID  . rosuvastatin  40 mg Oral q1800   Continuous Infusions: . sodium chloride 100 mL/hr at 03/08/19 1336  .  ceFAZolin  (ANCEF) IV 2 g (03/08/19 0529)     LOS: 3 days   Time spent: 45 minutes.  Lorella Nimrod, MD [facility] Pager [phone]  This record has been created using Dragon voice recognition software. Errors have been sought and corrected,but may not always be located. Such creation errors do not reflect on the standard of care.  If 7PM-7AM, please contact night-coverage [email or web address] Password [facility] 03/08/2019, 2:14 PM   This record has been created using Dragon voice recognition software. Errors have been sought and corrected,but may not always be located. Such creation errors do not reflect on the standard of care.

## 2019-03-08 NOTE — Consult Note (Signed)
[facility] CONSULT NOTE    Date: 03/08/2019                  Patient Name:  Carl Bryan  MRN: [id number]  DOB: 05-15-1945  Age / Sex: 73 y.o., male         PCP: Volney American, PA-C                 Service Requesting Consult:  Hospitalist                 Reason for Consult:  Metabolic acidosis with hyponatremia and hypokalemia.            History of Present Illness: Patient is a 73 y.o. male with a PMHx of hyperlipidemia, diabetes mellitus type 2, right above-the-knee amputation, hypertension, coronary artery disease,, who was admitted to [facility] on 03/05/2019 for evaluation of multiple falls.  We are now asked to see him for hyponatremia, hypokalemia, and increasing metabolic acidosis.  His creatinine currently appears to be 1.16.  Patient has been receiving IV fluid hydration with 0.9 normal saline.  Potassium also low at 3.0.  He is receiving oral supplementation.  Patient himself is a rather poor historian and cannot offer significant details in the history of present illness.  Also appears to have some acidosis with serum bicarbonate low at 17.   Medications: Outpatient medications: Medications Prior to Admission  Medication Sig Dispense Refill Last Dose  . ACCU-CHEK AVIVA PLUS test strip Check 1 time a day 100 each 12 Past Week at Unknown time  . allopurinol (ZYLOPRIM) 300 MG tablet Take 1 tablet (300 mg total) by mouth daily. 90 tablet 4 Past Week at Unknown time  . amLODipine (NORVASC) 10 MG tablet Take 1 tablet (10 mg total) by mouth daily. 90 tablet 4 Past Week at Unknown time  . glipiZIDE (GLUCOTROL) 5 MG tablet Take 1 tablet (5 mg total) by mouth daily before breakfast. 90 tablet 4 Past Week at Unknown time  . losartan (COZAAR) 100 MG tablet Take 1 tablet (100 mg total) by mouth daily. 90 tablet 4 Past Week at Unknown time  . metFORMIN (GLUCOPHAGE) 500 MG tablet Take 2 tablets (1,000 mg total) by mouth 2 (two) times daily with a meal. 360 tablet  4 Past Week at Unknown time  . rosuvastatin (CRESTOR) 40 MG tablet Take 1 tablet (40 mg total) by mouth daily. 90 tablet 4 Past Week at Unknown time  . Blood Glucose Monitoring Suppl (ACCU-CHEK AVIVA PLUS) w/Device KIT Test 1 time daily (Patient not taking: Reported on 03/05/2019) 1 kit 12 Not Taking at Unknown time  . cetirizine (ZYRTEC) 10 MG tablet Take 1 tablet (10 mg total) by mouth daily. (Patient not taking: Reported on 03/05/2019) 30 tablet 1 Not Taking at Unknown time  . mupirocin ointment (BACTROBAN) 2 % Place 1 application into the nose 2 (two) times daily. (Patient not taking: Reported on 03/05/2019) 22 g 0 Not Taking at Unknown time  . ondansetron (ZOFRAN) 4 MG tablet Take 1 tablet (4 mg total) by mouth every 8 (eight) hours as needed for nausea or vomiting. (Patient not taking: Reported on 03/05/2019) 20 tablet 0 Not Taking at Unknown time    Current medications: Current Facility-Administered Medications  Medication Dose Route Frequency Provider Last Rate Last Dose  . 0.9 %  sodium chloride infusion   Intravenous Continuous Lorella Nimrod, MD 100 mL/hr at 03/08/19 1336    . acetaminophen (TYLENOL) tablet 650 mg  650 mg Oral Q6H PRN Toy Baker, MD   650 mg at 03/08/19 0615   Or  . acetaminophen (TYLENOL) suppository 650 mg  650 mg Rectal Q6H PRN Doutova, Anastassia, MD      . ceFAZolin (ANCEF) IVPB 2g/100 mL premix  2 g Intravenous Q8H Tyler Pita, MD 200 mL/hr at 03/08/19 1536 2 g at 03/08/19 1536  . Chlorhexidine Gluconate Cloth 2 % PADS 6 each  6 each Topical Q0600 Toy Baker, MD   6 each at 03/08/19 0625  . enoxaparin (LOVENOX) injection 40 mg  40 mg Subcutaneous Q24H Tyler Pita, MD   40 mg at 03/07/19 2243  . HYDROcodone-acetaminophen (NORCO/VICODIN) 5-325 MG per tablet 1-2 tablet  1-2 tablet Oral Q4H PRN Toy Baker, MD   2 tablet at 03/08/19 1311  . insulin aspart (novoLOG) injection 0-9 Units  0-9 Units Subcutaneous Q4H Toy Baker, MD   2 Units at 03/08/19 1311  . ipratropium-albuterol (DUONEB) 0.5-2.5 (3) MG/3ML nebulizer solution 3 mL  3 mL Nebulization Q6H Lorella Nimrod, MD   3 mL at 03/08/19 1409  . MEDLINE mouth rinse  15 mL Mouth Rinse BID Lorella Nimrod, MD   15 mL at 03/08/19 0835  . ondansetron (ZOFRAN) tablet 4 mg  4 mg Oral Q6H PRN Doutova, Anastassia, MD       Or  . ondansetron (ZOFRAN) injection 4 mg  4 mg Intravenous Q6H PRN Doutova, Anastassia, MD      . phosphorus (K PHOS NEUTRAL) tablet 500 mg  500 mg Oral BID Lorella Nimrod, MD   500 mg at 03/08/19 1312  . potassium chloride SA (KLOR-CON) CR tablet 40 mEq  40 mEq Oral BID Lorella Nimrod, MD   40 mEq at 03/08/19 0834  . rosuvastatin (CRESTOR) tablet 40 mg  40 mg Oral q1800 Toy Baker, MD   40 mg at 03/07/19 1626      Allergies: Allergies  Allergen Reactions  . Doxycycline Calcium Rash  . Tussionex Pennkinetic Er [Hydrocod Polst-Cpm Polst Er] Itching  . Vytorin [Ezetimibe-Simvastatin]     Sleepy      Past Medical History: Past Medical History:  Diagnosis Date  . Bleeding ulcer   . Bronchospasm   . Diabetes mellitus without complication (HCC)    Type 2  . Diverticulosis   . Pure hypercholesterolemia   . Traumatic amputation of leg above knee [facility])      Past Surgical History: Past Surgical History:  Procedure Laterality Date  . ABOVE KNEE LEG AMPUTATION  1971  . APPENDECTOMY    . KNEE SURGERY     x 2.     Family History: Family History  Problem Relation Age of Onset  . Cancer Mother        Colon cancer  . Lung disease Father      Social History: Social History   Socioeconomic History  . Marital status: Married    Spouse name: Not on file  . Number of children: Not on file  . Years of education: Not on file  . Highest education level: 6th grade  Occupational History  . Occupation: Retired  Scientific laboratory technician  . Financial resource strain: Not hard at all  . Food insecurity    Worry: Never true     Inability: Never true  . Transportation needs    Medical: No    Non-medical: No  Tobacco Use  . Smoking status: Former Smoker    Types: Cigarettes    Quit date: 09/29/1964  Years since quitting: 54.4  . Smokeless tobacco: Never Used  Substance and Sexual Activity  . Alcohol use: No    Alcohol/week: 0.0 standard drinks  . Drug use: No  . Sexual activity: Not on file  Lifestyle  . Physical activity    Days per week: 0 days    Minutes per session: 0 min  . Stress: Not at all  Relationships  . Social connections    Talks on phone: More than three times a week    Gets together: More than three times a week    Attends religious service: More than 4 times per year    Active member of club or organization: Yes    Attends meetings of clubs or organizations: More than 4 times per year    Relationship status: Married  . Intimate partner violence    Fear of current or ex partner: No    Emotionally abused: No    Physically abused: No    Forced sexual activity: No  Other Topics Concern  . Not on file  Social History Narrative  . Not on file     Review of Systems: Patient cannot offer detailed review of systems due to confusion.  Vital Signs: Blood pressure 122/74, pulse 89, temperature 98 F (36.7 C), temperature source Oral, resp. rate 20, height 5' 6" (1.676 m), weight 75.8 kg, SpO2 95 %.  Weight trends: Filed Weights   03/05/19 1951 03/05/19 2350 03/07/19 1600  Weight: 78 kg 71.2 kg 75.8 kg    Physical Exam: General: NAD, resting in bed comfortably.  Head: Normocephalic, atraumatic.  Eyes: Anicteric, EOMI  Nose: Mucous membranes moist, not inflammed, nonerythematous.  Throat: Oropharynx nonerythematous, no exudate appreciated.   Neck: Supple, trachea midline.  Lungs:  Normal respiratory effort. Clear to auscultation BL without crackles or wheezes.  Heart: RRR. S1 and S2 normal without gallop, murmur, or rubs.  Abdomen:  BS normoactive. Soft, Nondistended,  non-tender.  No masses or organomegaly.  Extremities: Right above-the-knee amputation  Neurologic: Awake, alert but confused  Skin: No visible rashes, scars.    Lab results: Basic Metabolic Panel: Recent Labs  Lab 03/06/19 0419 03/07/19 0552 03/08/19 0420  NA 135 133* 131*  K 3.5 3.2* 3.0*  CL 105 103 101  CO2 21* 18* 17*  GLUCOSE 168* 139* 147*  BUN 25* 25* 21  CREATININE 1.24 1.26* 1.16  CALCIUM 9.0 8.7* 8.6*  MG 2.0 2.0 2.0  PHOS 3.1  --  1.8*    Liver Function Tests: Recent Labs  Lab 03/06/19 0419 03/07/19 0552 03/08/19 0420  AST _0 ALT _1 ALKPHOS 54 59 56  BILITOT 1.5* 1.7* 1.4*  PROT 6.1* 5.9* 5.6*  ALBUMIN 3.0* 2.7* 2.6*   No results for input(s): LIPASE, AMYLASE in the last 168 hours. Recent Labs  Lab 03/05/19 2159  AMMONIA 21    CBC: Recent Labs  Lab 03/05/19 1659 03/06/19 0419 03/07/19 0552 03/08/19 0420  WBC 13.6* 17.7* 11.3* 9.7  NEUTROABS 12.3*  --   --   --   HGB 15.5 13.2 12.4* 12.1*  HCT 44.6 38.7* 35.4* 33.7*  MCV 91.4 91.9 89.2 88.7  PLT 173 154 137* 140*    Cardiac Enzymes: Recent Labs  Lab 03/05/19 2159 03/06/19 0419 03/07/19 0552  CKTOTAL 1,141* 725* 226    BNP: Invalid input(s): POCBNP  CBG: Recent Labs  Lab 03/07/19 0750 03/07/19 1134 03/07/19 1539 03/08/19 0745 03/08/19 1222  GLUCAP 120* 133* 124*  142* 154*    Microbiology: Results for orders placed or performed during the hospital encounter of 03/05/19  Urine culture     Status: Abnormal   Collection Time: 03/05/19  4:46 PM   Specimen: In/Out Cath Urine  Result Value Ref Range Status   Specimen Description   Final    IN/OUT CATH URINE Performed at [facility], [address] [place], [place] [address]    Special Requests   Final    NONE Performed at [facility], [address] [place], [place] [address]    Culture 40,000 COLONIES/mL ESCHERICHIA COLI (A)  Final   Report Status 03/07/2019 FINAL  Final    Organism ID, Bacteria ESCHERICHIA COLI (A)  Final      Susceptibility   Escherichia coli - MIC*    AMPICILLIN >=32 RESISTANT Resistant     CEFAZOLIN <=4 SENSITIVE Sensitive     CEFTRIAXONE <=1 SENSITIVE Sensitive     CIPROFLOXACIN >=4 RESISTANT Resistant     GENTAMICIN <=1 SENSITIVE Sensitive     IMIPENEM <=0.25 SENSITIVE Sensitive     NITROFURANTOIN <=16 SENSITIVE Sensitive     TRIMETH/SULFA <=20 SENSITIVE Sensitive     AMPICILLIN/SULBACTAM 16 INTERMEDIATE Intermediate     PIP/TAZO <=4 SENSITIVE Sensitive     Extended ESBL NEGATIVE Sensitive     * 40,000 COLONIES/mL ESCHERICHIA COLI  Blood Culture (routine x 2)     Status: Abnormal   Collection Time: 03/05/19  5:00 PM   Specimen: BLOOD  Result Value Ref Range Status   Specimen Description   Final    BLOOD BLOOD RIGHT HAND Performed at [facility], [place] [address] [place], [place] [address]    Special Requests   Final    BOTTLES DRAWN AEROBIC AND ANAEROBIC Blood Culture adequate volume Performed at [facility], [place] [address], [place], [place] [address]    Culture  Setup Time   Final    GRAM NEGATIVE RODS IN BOTH AEROBIC AND ANAEROBIC BOTTLES CRITICAL VALUE NOTED.  VALUE IS CONSISTENT WITH PREVIOUSLY REPORTED AND CALLED VALUE. Performed at [facility], [address] [place], [place] [address]    Culture (A)  Final    ESCHERICHIA COLI SUSCEPTIBILITIES PERFORMED ON PREVIOUS CULTURE WITHIN THE LAST 5 DAYS. Performed at [facility], [place] [address], [place], [place] [address]    Report Status 03/08/2019 FINAL  Final  Blood Culture (routine x 2)     Status: Abnormal   Collection Time: 03/05/19  5:02 PM   Specimen: BLOOD LEFT FOREARM  Result Value Ref Range Status   Specimen Description BLOOD LEFT FOREARM  Final   Special Requests   Final    BOTTLES DRAWN AEROBIC AND ANAEROBIC Blood Culture adequate volume   Culture  Setup Time   Final    GRAM NEGATIVE RODS IN BOTH AEROBIC AND ANAEROBIC  BOTTLES CRITICAL RESULT CALLED TO, READ BACK BY AND VERIFIED WITHHart Robinsons [facility] [id number] 03/06/2019 HNM Performed at [facility], [place] [address] [place], [place] [address]    Culture ESCHERICHIA COLI (A)  Final   Report Status 03/08/2019 FINAL  Final   Organism ID, Bacteria ESCHERICHIA COLI  Final      Susceptibility   Escherichia coli - MIC*    AMPICILLIN >=32 RESISTANT Resistant     CEFAZOLIN <=4 SENSITIVE Sensitive     CEFEPIME <=1 SENSITIVE Sensitive     CEFTAZIDIME <=1 SENSITIVE Sensitive     CEFTRIAXONE <=  1 SENSITIVE Sensitive     CIPROFLOXACIN >=4 RESISTANT Resistant     GENTAMICIN <=1 SENSITIVE Sensitive     IMIPENEM <=0.25 SENSITIVE Sensitive     TRIMETH/SULFA <=20 SENSITIVE Sensitive     AMPICILLIN/SULBACTAM 16 INTERMEDIATE Intermediate     PIP/TAZO <=4 SENSITIVE Sensitive     Extended ESBL NEGATIVE Sensitive     * ESCHERICHIA COLI  Blood Culture ID Panel (Reflexed)     Status: Abnormal   Collection Time: 03/05/19  5:02 PM  Result Value Ref Range Status   Enterococcus species NOT DETECTED NOT DETECTED Final   Listeria monocytogenes NOT DETECTED NOT DETECTED Final   Staphylococcus species NOT DETECTED NOT DETECTED Final   Staphylococcus aureus (BCID) NOT DETECTED NOT DETECTED Final   Streptococcus species NOT DETECTED NOT DETECTED Final   Streptococcus agalactiae NOT DETECTED NOT DETECTED Final   Streptococcus pneumoniae NOT DETECTED NOT DETECTED Final   Streptococcus pyogenes NOT DETECTED NOT DETECTED Final   Acinetobacter baumannii NOT DETECTED NOT DETECTED Final   Enterobacteriaceae species DETECTED (A) NOT DETECTED Final    Comment: Enterobacteriaceae represent a large family of gram-negative bacteria, not a single organism. CRITICAL RESULT CALLED TO, READ BACK BY AND VERIFIED WITH: SCOTT HALL PHARMD [id number] 03/06/2019 HNM    Enterobacter cloacae complex NOT DETECTED NOT DETECTED Final   Escherichia coli DETECTED (A) NOT DETECTED Final    Comment: CRITICAL  RESULT CALLED TO, READ BACK BY AND VERIFIED WITH: Hart Robinsons PHARMD [id number] 03/06/2019 HNM    Klebsiella oxytoca NOT DETECTED NOT DETECTED Final   Klebsiella pneumoniae NOT DETECTED NOT DETECTED Final   Proteus species NOT DETECTED NOT DETECTED Final   Serratia marcescens NOT DETECTED NOT DETECTED Final   Carbapenem resistance NOT DETECTED NOT DETECTED Final   Haemophilus influenzae NOT DETECTED NOT DETECTED Final   Neisseria meningitidis NOT DETECTED NOT DETECTED Final   Pseudomonas aeruginosa NOT DETECTED NOT DETECTED Final   Candida albicans NOT DETECTED NOT DETECTED Final   Candida glabrata NOT DETECTED NOT DETECTED Final   Candida krusei NOT DETECTED NOT DETECTED Final   Candida parapsilosis NOT DETECTED NOT DETECTED Final   Candida tropicalis NOT DETECTED NOT DETECTED Final    Comment: Performed at [facility], [address] [place], [place] [address]  SARS Coronavirus 2 by RT PCR (hospital order, performed in [place] hospital lab) Nasopharyngeal Nasopharyngeal Swab     Status: None   Collection Time: 03/05/19  6:07 PM   Specimen: Nasopharyngeal Swab  Result Value Ref Range Status   SARS Coronavirus 2 NEGATIVE NEGATIVE Final    Comment: (NOTE) SARS-CoV-2 target nucleic acids are NOT DETECTED. The SARS-CoV-2 RNA is generally detectable in upper and lower respiratory specimens during the acute phase of infection. The lowest concentration of SARS-CoV-2 viral copies this assay can detect is 250 copies / mL. A negative result does not preclude SARS-CoV-2 infection and should not be used as the sole basis for treatment or other patient management decisions.  A negative result may occur with improper specimen collection / handling, submission of specimen other than nasopharyngeal swab, presence of viral mutation(s) within the areas targeted by this assay, and inadequate number of viral copies (<250 copies / mL). A negative result must be combined with  clinical observations, patient history, and epidemiological information. Fact Sheet for Patients:   StrictlyIdeas.no Fact Sheet for Healthcare Providers: BankingDealers.co.za This test is not yet approved or cleared  by the [place] FDA and has been authorized for detection and/or  diagnosis of SARS-CoV-2 by FDA under an Emergency Use Authorization (EUA).  This EUA will remain in effect (meaning this test can be used) for the duration of the COVID-19 declaration under Section 564(b)(1) of the Act, 21 U.S.C. section 360bbb-3(b)(1), unless the authorization is terminated or revoked sooner. Performed at [facility], [address] [place], [place] [address]   MRSA PCR Screening     Status: None   Collection Time: 03/05/19 10:52 PM   Specimen: Nasal Mucosa; Nasopharyngeal  Result Value Ref Range Status   MRSA by PCR NEGATIVE NEGATIVE Final    Comment:        The GeneXpert MRSA Assay (FDA approved for NASAL specimens only), is one component of a comprehensive MRSA colonization surveillance program. It is not intended to diagnose MRSA infection nor to guide or monitor treatment for MRSA infections. Performed at [facility], [address] [place], [place] [address]     Coagulation Studies: Recent Labs    03/05/19 1659  LABPROT 14.9  INR 1.2    Urinalysis: Recent Labs    03/05/19 1646 03/08/19 0803  COLORURINE AMBER* YELLOW*  LABSPEC 1.026 1.011  PHURINE 5.0 6.0  GLUCOSEU >=500* 50*  HGBUR LARGE* MODERATE*  BILIRUBINUR NEGATIVE NEGATIVE  KETONESUR 20* 20*  PROTEINUR >=300* 100*  NITRITE NEGATIVE NEGATIVE  LEUKOCYTESUR TRACE* TRACE*      Imaging: Dg Chest 2 View  Result Date: 03/07/2019 CLINICAL DATA:  Sepsis, fever, hypoxia and shortness of breath. EXAM: CHEST - 2 VIEW COMPARISON:  03/06/2019 FINDINGS: The heart size and mediastinal contours are within normal limits. Stable mild  interstitial prominence in both lungs without evidence edema, focal airspace consolidation or pleural fluid. The visualized skeletal structures are unremarkable. IMPRESSION: Stable mild interstitial prominence. No focal infiltrate identified. Electronically Signed   By: Aletta Edouard M.D.   On: 03/07/2019 09:04   Ct Angio Chest Pe W Or Wo Contrast  Result Date: 03/07/2019 CLINICAL DATA:  Shortness of breath. Elevated D-dimer. EXAM: CT ANGIOGRAPHY CHEST WITH CONTRAST TECHNIQUE: Multidetector CT imaging of the chest was performed using the standard protocol during bolus administration of intravenous contrast. Multiplanar CT image reconstructions and MIPs were obtained to evaluate the vascular anatomy. CONTRAST:  12m OMNIPAQUE IOHEXOL 350 MG/ML SOLN COMPARISON:  Chest x-ray dated 03/07/2019 FINDINGS: Cardiovascular: Satisfactory opacification of the pulmonary arteries to the segmental level. No evidence of pulmonary embolism. Normal heart size. No pericardial effusion. Mediastinum/Nodes: No enlarged mediastinal, hilar, or axillary lymph nodes. Thyroid gland, trachea, and esophagus demonstrate no significant findings. Lungs/Pleura: Tiny intrapulmonary node along the minor fissure on the right of no significance. Peripheral atelectasis at the right lung base posteriorly. No effusions. Upper Abdomen: Negative. Musculoskeletal: No chest wall abnormality. No acute or significant osseous findings. Review of the MIP images confirms the above findings. IMPRESSION: No pulmonary emboli. Slight atelectasis at the right lung base posteriorly. Electronically Signed   By: JLorriane ShireM.D.   On: 03/07/2019 14:55   UKoreaVenous Img Lower Bilateral (dvt)  Result Date: 03/07/2019 CLINICAL DATA:  73year old with hypoxia, shortness of breath and elevated D-dimer. Above the knee amputation in the right lower extremity. EXAM: BILATERAL LOWER EXTREMITY VENOUS DOPPLER ULTRASOUND TECHNIQUE: Gray-scale sonography with graded  compression, as well as color Doppler and duplex ultrasound were performed to evaluate the lower extremity deep venous systems from the level of the common femoral vein and including the common femoral, femoral, profunda femoral, popliteal and calf veins including the posterior tibial, peroneal and gastrocnemius  veins when visible. The superficial great saphenous vein was also interrogated. Spectral Doppler was utilized to evaluate flow at rest and with distal augmentation maneuvers in the common femoral, femoral and popliteal veins. COMPARISON:  None. FINDINGS: RIGHT LOWER EXTREMITY Common Femoral Vein: No evidence of thrombus. Normal compressibility, respiratory phasicity and response to augmentation. Saphenofemoral Junction: No evidence of thrombus. Normal compressibility and flow on color Doppler imaging. Profunda Femoral Vein: No evidence of thrombus. Normal compressibility and flow on color Doppler imaging. Femoral Vein: No evidence of thrombus. Normal compressibility, respiratory phasicity and response to augmentation. Other: Above the knee amputation in the right lower extremity. Other Findings:  None. LEFT LOWER EXTREMITY Common Femoral Vein: No evidence of thrombus. Normal compressibility, respiratory phasicity and response to augmentation. Saphenofemoral Junction: No evidence of thrombus. Normal compressibility and flow on color Doppler imaging. Profunda Femoral Vein: No evidence of thrombus. Normal compressibility and flow on color Doppler imaging. Femoral Vein: No evidence of thrombus. Normal compressibility, respiratory phasicity and response to augmentation. Popliteal Vein: No evidence of thrombus. Normal compressibility, respiratory phasicity and response to augmentation. Calf Veins: No evidence of thrombus. Normal compressibility and flow on color Doppler imaging. Other Findings:  None. IMPRESSION: No evidence of deep venous thrombosis in either lower extremity. Electronically Signed   By: Markus Daft M.D.   On: 03/07/2019 15:46   Ct Renal Stone Study  Result Date: 03/08/2019 CLINICAL DATA:  73 year old with personal history of urinary tract calculi, presenting with generalized abdominal pain and fever. Surgical history includes appendectomy. EXAM: CT ABDOMEN AND PELVIS WITHOUT CONTRAST TECHNIQUE: Multidetector CT imaging of the abdomen and pelvis was performed following the standard protocol without IV contrast. COMPARISON:  None. FINDINGS: Lower chest: Respiratory motion blurred several of the images. Expected dependent atelectasis posteriorly in the lower lobes. Visualized lung bases otherwise clear. Heart size upper normal. RIGHT coronary artery atherosclerosis. Hepatobiliary: Normal unenhanced appearance of the liver. High attenuation bile in the gallbladder. No calcified gallstones. No pericholecystic edema/inflammation. No biliary ductal dilation. Pancreas: Mildly atrophic without evidence of mass, ductal dilation, or inflammation. Spleen: Normal in size and appearance. Adrenals/Urinary Tract: Normal appearing adrenal glands. Approximate 6.8 x 5.5 cm simple cyst arising from the MEDIAL RIGHT kidney. Much smaller exophytic simple cyst arising from the lower pole of the RIGHT kidney. Within the limits of the unenhanced technique, no significant focal parenchymal abnormality involving either kidney. No urinary tract calculi. No hydronephrosis. Normal appearing urinary bladder. Stomach/Bowel: Stomach normal in appearance for the degree of distention. Diverticulum arising from the proximal transverse duodenum. Small bowel normal in appearance otherwise. Small lipoma involving the ileocecal valve. Entire colon decompressed. Descending and sigmoid colon diverticulosis without evidence of acute diverticulitis. Surgically absent appendix. Vascular/Lymphatic: Moderate aorto-iliofemoral atherosclerosis without evidence of aneurysm. The RIGHT common iliac and external iliac arteries are significantly smaller  than those on the LEFT. No pathologic lymphadenopathy. Reproductive: Moderate prostate gland enlargement. Normal seminal vesicles. Other: RIGHT femoral hernia suspected. BILATERAL inguinal hernias containing fat. Musculoskeletal: Marked atrophy of the RIGHT gluteal muscles. Remote healed fractures involving the LEFT INFERIOR pubic ramus, the LEFT acetabulum. Degenerative changes and partial ankylosis of the RIGHT SI joint. Diffuse lumbar facet degenerative changes. Lower thoracic spondylosis. No acute findings. IMPRESSION: 1. No acute abnormalities involving the abdomen or pelvis. 2. High attenuation bile (sludge?) In the gallbladder without calcified gallstones or CT evidence of acute cholecystitis. 3. Descending and sigmoid colon diverticulosis without evidence of acute diverticulitis. 4. Moderate prostate gland enlargement. 5. BILATERAL inguinal hernias containing  fat. RIGHT femoral hernia suspected. 6. Marked atrophy of the RIGHT gluteal muscles. 7. Asymmetric size of the common and external iliac arteries, those on the LEFT much larger than those on the RIGHT. 8. Duodenal diverticulum. Aortic Atherosclerosis (ICD10-170.0) Electronically Signed   By: Evangeline Dakin M.D.   On: 03/08/2019 15:10      Assessment & Plan: Pt is a 73 y.o. male with a PMHx of hyperlipidemia, diabetes mellitus type 2, right above-the-knee amputation, hypertension, coronary artery disease,, who was admitted to [facility] on 03/05/2019 for evaluation of multiple falls.   1.  Mild hyponatremia serum sodium 131. 2.  Hypokalemia serum potassium 3.0. 3.  Metabolic acidosis serum bicarbonate 17. 4.  Altered mental status.  Plan: The patient has multiple electrolyte derangements.  He now has hyponatremia, hypokalemia, metabolic acidosis, and hypophosphatemia.  This may be induced to poor p.o. intake as these findings are all rather acute.  Discontinue 0.9 normal saline as this can contribute to a nongap metabolic acidosis.  Switch the  patient over to sodium bicarbonate drip instead.  We will also replete serum potassium intravenously as he may have poor absorption.  Also check serum cortisol as sodium is low.  TSH found to be normal.  Thanks for consultation.  Further plan as patient progresses.

## 2019-03-08 NOTE — Progress Notes (Signed)
OT Cancellation Note  Patient Details Name: Carl Bryan MRN: [id number] DOB: 18-Dec-1945   Cancelled Treatment:    Reason Eval/Treat Not Completed: Patient at procedure or test/ unavailable Order received and chart reviewed.  Pt being taken to CT and not available for OT evaluation.  Will attempt again later today time permitting.  Thank you for the referral.  Chrys Racer, OTR/L, [place] [phone] 03/08/19, 2:15 PM

## 2019-03-08 NOTE — Progress Notes (Signed)
Physical Therapy Treatment Patient Details Name: Carl Bryan MRN: [id number] DOB: 1945/11/11 Today's Date: 03/08/2019    History of Present Illness 73 y.o. male with medical history significant of dm2, HTN, HLD.  Pt has been weak recently with multiple falls, admitted to CCU with sepsis.  Pt had R hip fx in August and reports he has not used prosthetic much since (was NWBing initially)    PT Comments    Pt was seen for mobility and note his struggle today to stand and balance, and resulted in waiting to attempt crutches.  Pt was anxious to try them but was too unsteady on his feet for a practical trial.  Will work toward this next visit to ensure his ability to transition home as planned but will consider SNF recommendation if he does not get as far functionally as expected.   Follow Up Recommendations  Home health PT     Equipment Recommendations  None recommended by PT    Recommendations for Other Services       Precautions / Restrictions Precautions Precautions: Fall Precaution Comments: pt is having issues of safety awareness Restrictions Weight Bearing Restrictions: No    Mobility  Bed Mobility Overal bed mobility: Needs Assistance Bed Mobility: Supine to Sit;Sit to Supine     Supine to sit: Min assist Sit to supine: Min assist   General bed mobility comments: pt cannot control balance without help to get to side of bed  Transfers Overall transfer level: Needs assistance Equipment used: Rolling walker (2 wheeled) Transfers: Sit to/from Stand Sit to Stand: Mod assist         General transfer comment: mod assist with extra time to capture balance  Ambulation/Gait Ambulation/Gait assistance: Min assist Gait Distance (Feet): 6 Feet Assistive device: Rolling walker (2 wheeled)       General Gait Details: sidestepped on side of bed with difficulty clearing his L heel on the floor   Stairs             Wheelchair Mobility    Modified Rankin (Stroke  Patients Only)       Balance Overall balance assessment: Needs assistance Sitting-balance support: Feet supported Sitting balance-Leahy Scale: Fair     Standing balance support: Bilateral upper extremity supported;During functional activity Standing balance-Leahy Scale: Poor                              Cognition Arousal/Alertness: Awake/alert Behavior During Therapy: WFL for tasks assessed/performed Overall Cognitive Status: Within Functional Limits for tasks assessed                                 General Comments: pt does have a wish to use crutches but is not ready      Exercises      General Comments General comments (skin integrity, edema, etc.): pt is up to stand with a lot of help, feeling perhaps weaker today but is very unsteady upon initial standing and could control only with time and cues on RW      Pertinent Vitals/Pain Pain Assessment: No/denies pain    Home Living                      Prior Function            PT Goals (current goals can now be found in the care plan  section) Acute Rehab PT Goals Patient Stated Goal: go home    Frequency    Min 2X/week      PT Plan Current plan remains appropriate    Co-evaluation              AM-PAC PT "6 Clicks" Mobility   Outcome Measure  Help needed turning from your back to your side while in a flat bed without using bedrails?: None Help needed moving from lying on your back to sitting on the side of a flat bed without using bedrails?: A Little Help needed moving to and from a bed to a chair (including a wheelchair)?: A Little Help needed standing up from a chair using your arms (e.g., wheelchair or bedside chair)?: A Lot Help needed to walk in hospital room?: A Little   6 Click Score: 15    End of Session Equipment Utilized During Treatment: Gait belt;Oxygen Activity Tolerance: Patient limited by fatigue;Treatment limited secondary to medical  complications (Comment) Patient left: in bed;with call bell/phone within reach Nurse Communication: Mobility status PT Visit Diagnosis: Muscle weakness (generalized) (M62.81);Difficulty in walking, not elsewhere classified (R26.2)     Time: 3428-7681 PT Time Calculation (min) (ACUTE ONLY): 32 min  Charges:  $Gait Training: 8-22 mins $Therapeutic Activity: 8-22 mins               Ivar Drape 03/08/2019, 8:32 PM   Samul Dada, PT MS Acute Rehab Dept. Number: [facility] [id number] and [facility] [phone]

## 2019-03-09 LAB — RENAL FUNCTION PANEL
Albumin: 2.6 g/dL — ABNORMAL LOW (ref 3.5–5.0)
Anion gap: 14 (ref 5–15)
BUN: 17 mg/dL (ref 8–23)
CO2: 22 mmol/L (ref 22–32)
Calcium: 8.5 mg/dL — ABNORMAL LOW (ref 8.9–10.3)
Chloride: 101 mmol/L (ref 98–111)
Creatinine, Ser: 0.92 mg/dL (ref 0.61–1.24)
GFR calc Af Amer: >60 mL/min (ref >60)
GFR calc non Af Amer: >60 mL/min (ref >60)
Glucose, Bld: 158 mg/dL — ABNORMAL HIGH (ref 70–99)
Phosphorus: 3 mg/dL (ref 2.5–4.6)
Potassium: 3.7 mmol/L (ref 3.5–5.1)
Sodium: 137 mmol/L (ref 135–145)

## 2019-03-09 LAB — CBC
HCT: 35.2 % — ABNORMAL LOW (ref 39.0–52.0)
Hemoglobin: 12.1 g/dL — ABNORMAL LOW (ref 13.0–17.0)
MCH: 31 pg (ref 26.0–34.0)
MCHC: 34.4 g/dL (ref 30.0–36.0)
MCV: 90.3 fL (ref 80.0–100.0)
Platelets: 130 10*3/uL — ABNORMAL LOW (ref 150–400)
RBC: 3.9 MIL/uL — ABNORMAL LOW (ref 4.22–5.81)
RDW: 13.6 % (ref 11.5–15.5)
WBC: 7.7 10*3/uL (ref 4.0–10.5)
nRBC: 0 % (ref 0.0–0.2)

## 2019-03-09 LAB — MAGNESIUM: Magnesium: 1.9 mg/dL (ref 1.7–2.4)

## 2019-03-09 LAB — CORTISOL-AM, BLOOD: Cortisol - AM: 17.3 ug/dL (ref 6.7–22.6)

## 2019-03-09 LAB — GLUCOSE, CAPILLARY
Glucose-Capillary: 171 mg/dL — ABNORMAL HIGH (ref 70–99)
Glucose-Capillary: 163 mg/dL — ABNORMAL HIGH (ref 70–99)

## 2019-03-09 MED ORDER — ALUM & MAG HYDROXIDE-SIMETH 200-200-20 MG/5ML PO SUSP
30.0000 mL | ORAL | Status: DC | PRN
  Administered 2019-03-09: 13:00:00 30 mL via ORAL
  Filled 2019-03-09: qty 30

## 2019-03-09 MED ORDER — PANTOPRAZOLE SODIUM 40 MG PO TBEC
40.0000 mg | DELAYED_RELEASE_TABLET | Freq: Every day | ORAL | Status: DC
  Administered 2019-03-09 – 2019-03-11 (×3): 40 mg via ORAL
  Filled 2019-03-09 (×3): qty 1

## 2019-03-09 MED ORDER — PHENAZOPYRIDINE HCL 100 MG PO TABS
100.0000 mg | ORAL_TABLET | Freq: Once | ORAL | Status: AC
  Administered 2019-03-09: 11:00:00 100 mg via ORAL
  Filled 2019-03-09: qty 1

## 2019-03-09 NOTE — Progress Notes (Signed)
[facility]  ROUNDING NOTE   Subjective:  Patient status appears improved today. Serum bicarbonate now up to 22 with a potassium of 3.7. Renal parameters also appear to be improved. Patient is complaining of some dysuria.   Objective:  Vital signs in last 24 hours:  Temp:  [97.5 F (36.4 C)] 97.5 F (36.4 C) (11/29 0835) Pulse Rate:  [69-89] 69 (11/29 0835) Resp:  [18] 18 (11/29 0835) BP: (122-139)/(73-74) 139/73 (11/29 0835) SpO2:  [91 %-95 %] 91 % (11/29 0835)  Weight change:  Filed Weights   03/05/19 1951 03/05/19 2350 03/07/19 1600  Weight: 78 kg 71.2 kg 75.8 kg    Intake/Output: I/O last 3 completed shifts: In: 1895.4 [I.V.:1092.3; IV Piggyback:803.2] Out: 3818 [Urine:5275]   Intake/Output this shift:  No intake/output data recorded.  Physical Exam: General: No acute distress  Head: Normocephalic, atraumatic. Moist oral mucosal membranes  Eyes: Anicteric  Neck: Supple, trachea midline  Lungs:  Clear to auscultation, normal effort  Heart: S1S2 no rubs  Abdomen:  Soft, nontender, bowel sounds present  Extremities: Trace peripheral edema.  Neurologic: Awake, alert, following commands  Skin: No lesions       Basic Metabolic Panel: Recent Labs  Lab 03/05/19 2159 03/06/19 0419 03/07/19 0552 03/08/19 0420 03/09/19 0411  NA 131* 135 133* 131* 137  K 3.3* 3.5 3.2* 3.0* 3.7  CL 101 105 103 101 101  CO2 20* 21* 18* 17* 22  GLUCOSE 291* 168* 139* 147* 158*  BUN 22 25* 25* 21 17  CREATININE 1.22 1.24 1.26* 1.16 0.92  CALCIUM 9.0 9.0 8.7* 8.6* 8.5*  MG  --  2.0 2.0 2.0 1.9  PHOS  --  3.1  --  1.8* 3.0    Liver Function Tests: Recent Labs  Lab 03/05/19 1659 03/06/19 0419 03/07/19 0552 03/08/19 0420 03/09/19 0411  AST 31 31 25 23   --   ALT 24 22 23 22   --   ALKPHOS 104 54 59 56  --   BILITOT 3.0* 1.5* 1.7* 1.4*  --   PROT 7.7 6.1* 5.9* 5.6*  --   ALBUMIN 4.1 3.0* 2.7* 2.6* 2.6*   No results for input(s): LIPASE, AMYLASE in the last  168 hours. Recent Labs  Lab 03/05/19 2159  AMMONIA 21    CBC: Recent Labs  Lab 03/05/19 1659 03/06/19 0419 03/07/19 0552 03/08/19 0420 03/09/19 0411  WBC 13.6* 17.7* 11.3* 9.7 7.7  NEUTROABS 12.3*  --   --   --   --   HGB 15.5 13.2 12.4* 12.1* 12.1*  HCT 44.6 38.7* 35.4* 33.7* 35.2*  MCV 91.4 91.9 89.2 88.7 90.3  PLT 173 154 137* 140* 130*    Cardiac Enzymes: Recent Labs  Lab 03/05/19 2159 03/06/19 0419 03/07/19 0552  CKTOTAL 1,141* 725* 226    BNP: Invalid input(s): POCBNP  CBG: Recent Labs  Lab 03/07/19 1134 03/07/19 1539 03/08/19 0745 03/08/19 1222 03/08/19 1711  GLUCAP 133* 124* 142* 154* 181*    Microbiology: Results for orders placed or performed during the hospital encounter of 03/05/19  Urine culture     Status: Abnormal   Collection Time: 03/05/19  4:46 PM   Specimen: In/Out Cath Urine  Result Value Ref Range Status   Specimen Description   Final    IN/OUT CATH URINE Performed at [facility], [address] [place], [place] [address]    Special Requests   Final    NONE Performed at [facility], [address]  [place], [place] [address]    Culture 40,000 COLONIES/mL ESCHERICHIA COLI (A)  Final   Report Status 03/07/2019 FINAL  Final   Organism ID, Bacteria ESCHERICHIA COLI (A)  Final      Susceptibility   Escherichia coli - MIC*    AMPICILLIN >=32 RESISTANT Resistant     CEFAZOLIN <=4 SENSITIVE Sensitive     CEFTRIAXONE <=1 SENSITIVE Sensitive     CIPROFLOXACIN >=4 RESISTANT Resistant     GENTAMICIN <=1 SENSITIVE Sensitive     IMIPENEM <=0.25 SENSITIVE Sensitive     NITROFURANTOIN <=16 SENSITIVE Sensitive     TRIMETH/SULFA <=20 SENSITIVE Sensitive     AMPICILLIN/SULBACTAM 16 INTERMEDIATE Intermediate     PIP/TAZO <=4 SENSITIVE Sensitive     Extended ESBL NEGATIVE Sensitive     * 40,000 COLONIES/mL ESCHERICHIA COLI  Blood Culture (routine x 2)     Status: Abnormal   Collection Time: 03/05/19  5:00 PM    Specimen: BLOOD  Result Value Ref Range Status   Specimen Description   Final    BLOOD BLOOD RIGHT HAND Performed at [facility], [address] [place], [place] [address]    Special Requests   Final    BOTTLES DRAWN AEROBIC AND ANAEROBIC Blood Culture adequate volume Performed at [facility], [address], [place], [place] [address]    Culture  Setup Time   Final    GRAM NEGATIVE RODS IN BOTH AEROBIC AND ANAEROBIC BOTTLES CRITICAL VALUE NOTED.  VALUE IS CONSISTENT WITH PREVIOUSLY REPORTED AND CALLED VALUE. Performed at [facility], [address] [place], [place] [address]    Culture (A)  Final    ESCHERICHIA COLI SUSCEPTIBILITIES PERFORMED ON PREVIOUS CULTURE WITHIN THE LAST 5 DAYS. Performed at [facility], [address] [place], [place] [address]    Report Status 03/08/2019 FINAL  Final  Blood Culture (routine x 2)     Status: Abnormal   Collection Time: 03/05/19  5:02 PM   Specimen: BLOOD LEFT FOREARM  Result Value Ref Range Status   Specimen Description BLOOD LEFT FOREARM  Final   Special Requests   Final    BOTTLES DRAWN AEROBIC AND ANAEROBIC Blood Culture adequate volume   Culture  Setup Time   Final    GRAM NEGATIVE RODS IN BOTH AEROBIC AND ANAEROBIC BOTTLES CRITICAL RESULT CALLED TO, READ BACK BY AND VERIFIED WITHValrie Hart [facility] [id number] 03/06/2019 HNM Performed at [facility], [address], [place], [place] [address]    Culture ESCHERICHIA COLI (A)  Final   Report Status 03/08/2019 FINAL  Final   Organism ID, Bacteria ESCHERICHIA COLI  Final      Susceptibility   Escherichia coli - MIC*    AMPICILLIN >=32 RESISTANT Resistant     CEFAZOLIN <=4 SENSITIVE Sensitive     CEFEPIME <=1 SENSITIVE Sensitive     CEFTAZIDIME <=1 SENSITIVE Sensitive     CEFTRIAXONE <=1 SENSITIVE Sensitive     CIPROFLOXACIN >=4 RESISTANT Resistant     GENTAMICIN <=1 SENSITIVE Sensitive     IMIPENEM <=0.25 SENSITIVE Sensitive      TRIMETH/SULFA <=20 SENSITIVE Sensitive     AMPICILLIN/SULBACTAM 16 INTERMEDIATE Intermediate     PIP/TAZO <=4 SENSITIVE Sensitive     Extended ESBL NEGATIVE Sensitive     * ESCHERICHIA COLI  Blood Culture ID Panel (Reflexed)     Status: Abnormal   Collection Time: 03/05/19  5:02 PM  Result Value Ref Range Status  Enterococcus species NOT DETECTED NOT DETECTED Final   Listeria monocytogenes NOT DETECTED NOT DETECTED Final   Staphylococcus species NOT DETECTED NOT DETECTED Final   Staphylococcus aureus (BCID) NOT DETECTED NOT DETECTED Final   Streptococcus species NOT DETECTED NOT DETECTED Final   Streptococcus agalactiae NOT DETECTED NOT DETECTED Final   Streptococcus pneumoniae NOT DETECTED NOT DETECTED Final   Streptococcus pyogenes NOT DETECTED NOT DETECTED Final   Acinetobacter baumannii NOT DETECTED NOT DETECTED Final   Enterobacteriaceae species DETECTED (A) NOT DETECTED Final    Comment: Enterobacteriaceae represent a large family of gram-negative bacteria, not a single organism. CRITICAL RESULT CALLED TO, READ BACK BY AND VERIFIED WITH: SCOTT HALL PHARMD [id number] 03/06/2019 HNM    Enterobacter cloacae complex NOT DETECTED NOT DETECTED Final   Escherichia coli DETECTED (A) NOT DETECTED Final    Comment: CRITICAL RESULT CALLED TO, READ BACK BY AND VERIFIED WITH: Valrie HartSCOTT HALL PHARMD [id number] 03/06/2019 HNM    Klebsiella oxytoca NOT DETECTED NOT DETECTED Final   Klebsiella pneumoniae NOT DETECTED NOT DETECTED Final   Proteus species NOT DETECTED NOT DETECTED Final   Serratia marcescens NOT DETECTED NOT DETECTED Final   Carbapenem resistance NOT DETECTED NOT DETECTED Final   Haemophilus influenzae NOT DETECTED NOT DETECTED Final   Neisseria meningitidis NOT DETECTED NOT DETECTED Final   Pseudomonas aeruginosa NOT DETECTED NOT DETECTED Final   Candida albicans NOT DETECTED NOT DETECTED Final   Candida glabrata NOT DETECTED NOT DETECTED Final   Candida krusei NOT DETECTED NOT DETECTED  Final   Candida parapsilosis NOT DETECTED NOT DETECTED Final   Candida tropicalis NOT DETECTED NOT DETECTED Final    Comment: Performed at [facility], [address] [place], [place] [address]  SARS Coronavirus 2 by RT PCR (hospital order, performed in [facility]) Nasopharyngeal Nasopharyngeal Swab     Status: None   Collection Time: 03/05/19  6:07 PM   Specimen: Nasopharyngeal Swab  Result Value Ref Range Status   SARS Coronavirus 2 NEGATIVE NEGATIVE Final    Comment: (NOTE) SARS-CoV-2 target nucleic acids are NOT DETECTED. The SARS-CoV-2 RNA is generally detectable in upper and lower respiratory specimens during the acute phase of infection. The lowest concentration of SARS-CoV-2 viral copies this assay can detect is 250 copies / mL. A negative result does not preclude SARS-CoV-2 infection and should not be used as the sole basis for treatment or other patient management decisions.  A negative result may occur with improper specimen collection / handling, submission of specimen other than nasopharyngeal swab, presence of viral mutation(s) within the areas targeted by this assay, and inadequate number of viral copies (<250 copies / mL). A negative result must be combined with clinical observations, patient history, and epidemiological information. Fact Sheet for Patients:   BoilerBrush.com.cyhttps://www.fda.gov/media/136312/download Fact Sheet for Healthcare Providers: https://pope.com/https://www.fda.gov/media/136313/download This test is not yet approved or cleared  by the [place] FDA and has been authorized for detection and/or diagnosis of SARS-CoV-2 by FDA under an Emergency Use Authorization (EUA).  This EUA will remain in effect (meaning this test can be used) for the duration of the COVID-19 declaration under Section 564(b)(1) of the Act, 21 U.S.C. section 360bbb-3(b)(1), unless the authorization is terminated or revoked sooner. Performed at [facility], [address] [place], [place] [address]   MRSA PCR Screening     Status: None   Collection Time: 03/05/19 10:52 PM   Specimen: Nasal Mucosa; Nasopharyngeal  Result Value Ref Range Status   MRSA  by PCR NEGATIVE NEGATIVE Final    Comment:        The GeneXpert MRSA Assay (FDA approved for NASAL specimens only), is one component of a comprehensive MRSA colonization surveillance program. It is not intended to diagnose MRSA infection nor to guide or monitor treatment for MRSA infections. Performed at [facility], [address] [place], [place] [address]     Coagulation Studies: No results for input(s): LABPROT, INR in the last 72 hours.  Urinalysis: Recent Labs    03/08/19 0803  COLORURINE YELLOW*  LABSPEC 1.011  PHURINE 6.0  GLUCOSEU 50*  HGBUR MODERATE*  BILIRUBINUR NEGATIVE  KETONESUR 20*  PROTEINUR 100*  NITRITE NEGATIVE  LEUKOCYTESUR TRACE*      Imaging: Ct Angio Chest Pe W Or Wo Contrast  Result Date: 03/07/2019 CLINICAL DATA:  Shortness of breath. Elevated D-dimer. EXAM: CT ANGIOGRAPHY CHEST WITH CONTRAST TECHNIQUE: Multidetector CT imaging of the chest was performed using the standard protocol during bolus administration of intravenous contrast. Multiplanar CT image reconstructions and MIPs were obtained to evaluate the vascular anatomy. CONTRAST:  75mL OMNIPAQUE IOHEXOL 350 MG/ML SOLN COMPARISON:  Chest x-ray dated 03/07/2019 FINDINGS: Cardiovascular: Satisfactory opacification of the pulmonary arteries to the segmental level. No evidence of pulmonary embolism. Normal heart size. No pericardial effusion. Mediastinum/Nodes: No enlarged mediastinal, hilar, or axillary lymph nodes. Thyroid gland, trachea, and esophagus demonstrate no significant findings. Lungs/Pleura: Tiny intrapulmonary node along the minor fissure on the right of no significance. Peripheral atelectasis at the right lung base posteriorly. No effusions. Upper Abdomen: Negative.  Musculoskeletal: No chest wall abnormality. No acute or significant osseous findings. Review of the MIP images confirms the above findings. IMPRESSION: No pulmonary emboli. Slight atelectasis at the right lung base posteriorly. Electronically Signed   By: Francene Boyers M.D.   On: 03/07/2019 14:55   US Venous Img Lower Bilateral (dvt)  Result Date: 03/07/2019 CLINICAL DATA:  73 year old with hypoxia, shortness of breath and elevated D-dimer. Above the knee amputation in the right lower extremity. EXAM: BILATERAL LOWER EXTREMITY VENOUS DOPPLER ULTRASOUND TECHNIQUE: Gray-scale sonography with graded compression, as well as color Doppler and duplex ultrasound were performed to evaluate the lower extremity deep venous systems from the level of the common femoral vein and including the common femoral, femoral, profunda femoral, popliteal and calf veins including the posterior tibial, peroneal and gastrocnemius veins when visible. The superficial great saphenous vein was also interrogated. Spectral Doppler was utilized to evaluate flow at rest and with distal augmentation maneuvers in the common femoral, femoral and popliteal veins. COMPARISON:  None. FINDINGS: RIGHT LOWER EXTREMITY Common Femoral Vein: No evidence of thrombus. Normal compressibility, respiratory phasicity and response to augmentation. Saphenofemoral Junction: No evidence of thrombus. Normal compressibility and flow on color Doppler imaging. Profunda Femoral Vein: No evidence of thrombus. Normal compressibility and flow on color Doppler imaging. Femoral Vein: No evidence of thrombus. Normal compressibility, respiratory phasicity and response to augmentation. Other: Above the knee amputation in the right lower extremity. Other Findings:  None. LEFT LOWER EXTREMITY Common Femoral Vein: No evidence of thrombus. Normal compressibility, respiratory phasicity and response to augmentation. Saphenofemoral Junction: No evidence of thrombus. Normal  compressibility and flow on color Doppler imaging. Profunda Femoral Vein: No evidence of thrombus. Normal compressibility and flow on color Doppler imaging. Femoral Vein: No evidence of thrombus. Normal compressibility, respiratory phasicity and response to augmentation. Popliteal Vein: No evidence of thrombus. Normal compressibility, respiratory phasicity and response to augmentation. Calf Veins: No evidence of thrombus. Normal  compressibility and flow on color Doppler imaging. Other Findings:  None. IMPRESSION: No evidence of deep venous thrombosis in either lower extremity. Electronically Signed   By: Richarda Overlie M.D.   On: 03/07/2019 15:46   Ct Renal Stone Study  Result Date: 03/08/2019 CLINICAL DATA:  73 year old with personal history of urinary tract calculi, presenting with generalized abdominal pain and fever. Surgical history includes appendectomy. EXAM: CT ABDOMEN AND PELVIS WITHOUT CONTRAST TECHNIQUE: Multidetector CT imaging of the abdomen and pelvis was performed following the standard protocol without IV contrast. COMPARISON:  None. FINDINGS: Lower chest: Respiratory motion blurred several of the images. Expected dependent atelectasis posteriorly in the lower lobes. Visualized lung bases otherwise clear. Heart size upper normal. RIGHT coronary artery atherosclerosis. Hepatobiliary: Normal unenhanced appearance of the liver. High attenuation bile in the gallbladder. No calcified gallstones. No pericholecystic edema/inflammation. No biliary ductal dilation. Pancreas: Mildly atrophic without evidence of mass, ductal dilation, or inflammation. Spleen: Normal in size and appearance. Adrenals/Urinary Tract: Normal appearing adrenal glands. Approximate 6.8 x 5.5 cm simple cyst arising from the MEDIAL RIGHT kidney. Much smaller exophytic simple cyst arising from the lower pole of the RIGHT kidney. Within the limits of the unenhanced technique, no significant focal parenchymal abnormality involving either  kidney. No urinary tract calculi. No hydronephrosis. Normal appearing urinary bladder. Stomach/Bowel: Stomach normal in appearance for the degree of distention. Diverticulum arising from the proximal transverse duodenum. Small bowel normal in appearance otherwise. Small lipoma involving the ileocecal valve. Entire colon decompressed. Descending and sigmoid colon diverticulosis without evidence of acute diverticulitis. Surgically absent appendix. Vascular/Lymphatic: Moderate aorto-iliofemoral atherosclerosis without evidence of aneurysm. The RIGHT common iliac and external iliac arteries are significantly smaller than those on the LEFT. No pathologic lymphadenopathy. Reproductive: Moderate prostate gland enlargement. Normal seminal vesicles. Other: RIGHT femoral hernia suspected. BILATERAL inguinal hernias containing fat. Musculoskeletal: Marked atrophy of the RIGHT gluteal muscles. Remote healed fractures involving the LEFT INFERIOR pubic ramus, the LEFT acetabulum. Degenerative changes and partial ankylosis of the RIGHT SI joint. Diffuse lumbar facet degenerative changes. Lower thoracic spondylosis. No acute findings. IMPRESSION: 1. No acute abnormalities involving the abdomen or pelvis. 2. High attenuation bile (sludge?) In the gallbladder without calcified gallstones or CT evidence of acute cholecystitis. 3. Descending and sigmoid colon diverticulosis without evidence of acute diverticulitis. 4. Moderate prostate gland enlargement. 5. BILATERAL inguinal hernias containing fat. RIGHT femoral hernia suspected. 6. Marked atrophy of the RIGHT gluteal muscles. 7. Asymmetric size of the common and external iliac arteries, those on the LEFT much larger than those on the RIGHT. 8. Duodenal diverticulum. Aortic Atherosclerosis (ICD10-170.0) Electronically Signed   By: Hulan Saas M.D.   On: 03/08/2019 15:10     Medications:   .  ceFAZolin (ANCEF) IV 2 g (03/09/19 0541)  .  sodium bicarbonate (isotonic)  infusion in sterile water 75 mL/hr at 03/09/19 1128   . enoxaparin (LOVENOX) injection  40 mg Subcutaneous Q24H  . insulin aspart  0-9 Units Subcutaneous Q4H  . ipratropium-albuterol  3 mL Nebulization TID  . mouth rinse  15 mL Mouth Rinse BID  . rosuvastatin  40 mg Oral q1800   acetaminophen **OR** acetaminophen, alum & mag hydroxide-simeth, HYDROcodone-acetaminophen, ondansetron **OR** ondansetron (ZOFRAN) IV  Assessment/ Plan:  73 y.o. male with a PMHx of hyperlipidemia, diabetes mellitus type 2, right above-the-knee amputation, hypertension, coronary artery disease,, who was admitted to [facility] on 03/05/2019 for evaluation of multiple falls.   1.  Mild hyponatremia, serum sodium up to 137. 2.  Hypokalemia, resolved potassium 3.7. 3.  Metabolic acidosis, resolved serum bicarbonate up to 22. 4.  Altered mental status.  Plan: The patient status has improved significantly since yesterday.  His hyponatremia, hypokalemia, and metabolic acidosis have all improved with conservative measures.  We will go ahead and discontinue sodium bicarbonate infusion at this time.  No further potassium supplementation recommended at the moment.  His mental status also appears to have improved significantly.  No further renal input at this time.  We will sign off.   LOS: 4 Frank Pilger 11/29/202012:29 PM

## 2019-03-09 NOTE — Progress Notes (Signed)
PROGRESS NOTE    Carl Bryan  TGG:[id number] DOB: 30-Mar-1946 DOA: 03/05/2019 PCP: Volney American, PA-C   Brief Narrative:  Carl Bryan is a 73 y.o. male with medical history significant of dm2, HTN, HLD Status post traumatic amputation  right AKA Presented with falls at home has been very weak has been having multiple falls for the past few days. On arrival to ED he met sepsis criteria, febrile at 101, tachycardic, tachypneic and hypotensive.  He was initially started on cefepime and vancomycin and was given IV fluid according to sepsis protocol. Urine and blood cultures are growing E. Coli-antibiotics switched to meropenem, later switched to cefazolin according to sensitivity report.  Subjective: Patient was feeling better when seen during morning rounds.  He has no new complaints.  Later he developed some substernal pain which responded well to Maalox.  EKG remained normal.  Assessment & Plan:   Active Problems:   Pure hypercholesterolemia   Diabetes mellitus associated with hormonal etiology ([place])   Traumatic amputation of right leg above knee (HCC)   Essential (primary) hypertension   CAD (coronary artery disease)   Sepsis ([place])   Acute lower UTI   Acute metabolic encephalopathy   Elevated troponin   Rhabdomyolysis   Hypokalemia   Hyponatremia   Urinary tract infection with hematuria   Multiple falls   Hypoxia  Sepsis secondary to UTI.  Both urine and blood cultures are growing E. Coli with good sensitivity. Initial improvement in labs which includes lactic acid and electrolytes. Procalcitonin elevated at 2.98. Continue to have low-grade fever. Continue to require oxygen. -Place meropenem with cefazolin according to sensitivity report.  Patient will need 2 weeks of antibiotics. -Continue monitoring. -Repeat blood culture.  Acute hypoxic respiratory failure.  Patient continued to have increasing oxygen requirement.  Repeated two-view chest x-ray this morning which  was negative for any infiltrate or pulmonary edema. Wells score for PE was 1.5 which shows low risk but Geneva modified score was 6 which is consistent with moderate risk of PE.  D-dimer elevated above 2500, CTA and lower extremity Dopplers are negative for any PE or DVT. D-dimer can be elevated with sepsis. Hypoxia can be due to sepsis. ABG shows normal pH with PO2 of 73 on 5 L.  His oxygen requirement is improving now saturating in mid 90s on 2 L. -Continue to monitor.  Normal anion gap metabolic acidosis with hypokalemia and hyponatremia. Improved today. Patient started developing worsening acidosis with decreased in bicarb and becoming hypokalemic and hyponatremic despite replacing electrolytes. Urine anion gap is positive at 17 which points towards RTA type I or II. most likely type I(distal) as urine pH is 6. -Renal stone studies were negative. -Was placed on bicarb infusion by nephrology yesterday and responded well. -Bicarb infusion was discontinued today. -Nephrology was consulted-appreciate their recommendations.  Transient substernal chest pain.  Most likely GI in origin as responded well to Maalox.  EKG remained normal. -Add Protonix. -We will repeat troponin.  Elevated troponin.  History of CAD.  Most likely demand.  No chest pain.  Diabetes.  Uncontrolled as A1c is 8.0. -Continue CBG monitoring with SSI.  Rhabdomyolysis.  Most likely due to fever.  CK within normal limit now. -Continue IV hydration and monitor.  Acute encephalopathy.  Most likely metabolic secondary to bacteremia.  Resolved and patient was alert and oriented.  Hypertension.  Currently softer blood pressure. -Keep holding home meds.  Traumatic amputation of right leg above knee ([place]). Chronic and stable.  Electrolyte abnormalities.  Secondary to sepsis.  Hypokalemia and mild hyponatremia today.  RTA can be a possibility. -Continue to monitor-replete as needed.  Dyslipidemia. -Continue home  meds.  Objective: Vitals:   03/08/19 0819 03/08/19 1540 03/08/19 2025 03/09/19 0835  BP: (!) 101/40 122/74  139/73  Pulse: 84 89  69  Resp:    18  Temp: 98 F (36.7 C)   (!) 97.5 F (36.4 C)  TempSrc: Oral   Oral  SpO2: 96% 95% 94% 91%  Weight:      Height:        Intake/Output Summary (Last 24 hours) at 03/09/2019 1325 Last data filed at 03/09/2019 0545 Gross per 24 hour  Intake 1863.76 ml  Output 3725 ml  Net -1861.24 ml   Filed Weights   03/05/19 1951 03/05/19 2350 03/07/19 1600  Weight: 78 kg 71.2 kg 75.8 kg    Examination:  General exam: Appears calm and comfortable.  Respiratory system: Clear to auscultation. Respiratory effort normal, mild tachypnea. Cardiovascular system: Regular rate and rhythm, no JVD, murmurs, rubs, gallops or clicks. No pedal edema. Gastrointestinal system: Abdomen is nondistended, soft and nontender. No organomegaly or masses felt. Normal bowel sounds heard. Central nervous system: Alert and oriented. No focal neurological deficits. Extremities: Right AKA. Skin: No rashes, lesions or ulcers Psychiatry: Judgement and insight appear normal. Mood & affect appropriate.   DVT prophylaxis: Lovenox Code Status: Full Family Communication: No family at bedside. Disposition Plan: Pending improvement.  Consultants:  PCCM Nephrology  Procedures:   Antimicrobials:  Meropenem.  Started on 11/26  Data Reviewed: I have personally reviewed following labs and imaging studies  CBC: Recent Labs  Lab 03/05/19 1659 03/06/19 0419 03/07/19 0552 03/08/19 0420 03/09/19 0411  WBC 13.6* 17.7* 11.3* 9.7 7.7  NEUTROABS 12.3*  --   --   --   --   HGB 15.5 13.2 12.4* 12.1* 12.1*  HCT 44.6 38.7* 35.4* 33.7* 35.2*  MCV 91.4 91.9 89.2 88.7 90.3  PLT 173 154 137* 140* 660*   Basic Metabolic Panel: Recent Labs  Lab 03/05/19 2159 03/06/19 0419 03/07/19 0552 03/08/19 0420 03/09/19 0411  NA 131* 135 133* 131* 137  K 3.3* 3.5 3.2* 3.0* 3.7  CL 101  105 103 101 101  CO2 20* 21* 18* 17* 22  GLUCOSE 291* 168* 139* 147* 158*  BUN 22 25* 25* 21 17  CREATININE 1.22 1.24 1.26* 1.16 0.92  CALCIUM 9.0 9.0 8.7* 8.6* 8.5*  MG  --  2.0 2.0 2.0 1.9  PHOS  --  3.1  --  1.8* 3.0   GFR: Estimated Creatinine Clearance: 63.6 mL/min (by C-G formula based on SCr of 0.92 mg/dL). Liver Function Tests: Recent Labs  Lab 03/05/19 1659 03/06/19 0419 03/07/19 0552 03/08/19 0420 03/09/19 0411  AST '31 31 25 23  ' --   ALT '24 22 23 22  ' --   ALKPHOS 104 54 59 56  --   BILITOT 3.0* 1.5* 1.7* 1.4*  --   PROT 7.7 6.1* 5.9* 5.6*  --   ALBUMIN 4.1 3.0* 2.7* 2.6* 2.6*   No results for input(s): LIPASE, AMYLASE in the last 168 hours. Recent Labs  Lab 03/05/19 2159  AMMONIA 21   Coagulation Profile: Recent Labs  Lab 03/05/19 1659  INR 1.2   Cardiac Enzymes: Recent Labs  Lab 03/05/19 2159 03/06/19 0419 03/07/19 0552  CKTOTAL 1,141* 725* 226   BNP (last 3 results) No results for input(s): PROBNP in the last 8760 hours. HbA1C:  No results for input(s): HGBA1C in the last 72 hours. CBG: Recent Labs  Lab 03/07/19 1134 03/07/19 1539 03/08/19 0745 03/08/19 1222 03/08/19 1711  GLUCAP 133* 124* 142* 154* 181*   Lipid Profile: No results for input(s): CHOL, HDL, LDLCALC, TRIG, CHOLHDL, LDLDIRECT in the last 72 hours. Thyroid Function Tests: No results for input(s): TSH, T4TOTAL, FREET4, T3FREE, THYROIDAB in the last 72 hours. Anemia Panel: No results for input(s): VITAMINB12, FOLATE, FERRITIN, TIBC, IRON, RETICCTPCT in the last 72 hours. Sepsis Labs: Recent Labs  Lab 03/05/19 1659 03/05/19 2220 03/08/19 1115  PROCALCITON 2.98  --   --   LATICACIDVEN 3.6* 1.4 0.8    Recent Results (from the past 240 hour(s))  Urine culture     Status: Abnormal   Collection Time: 03/05/19  4:46 PM   Specimen: In/Out Cath Urine  Result Value Ref Range Status   Specimen Description   Final    IN/OUT CATH URINE Performed at [facility], [address] [place], [place] [address]    Special Requests   Final    NONE Performed at [facility], [address] [place], [place] [address]    Culture 40,000 COLONIES/mL ESCHERICHIA COLI (A)  Final   Report Status 03/07/2019 FINAL  Final   Organism ID, Bacteria ESCHERICHIA COLI (A)  Final      Susceptibility   Escherichia coli - MIC*    AMPICILLIN >=32 RESISTANT Resistant     CEFAZOLIN <=4 SENSITIVE Sensitive     CEFTRIAXONE <=1 SENSITIVE Sensitive     CIPROFLOXACIN >=4 RESISTANT Resistant     GENTAMICIN <=1 SENSITIVE Sensitive     IMIPENEM <=0.25 SENSITIVE Sensitive     NITROFURANTOIN <=16 SENSITIVE Sensitive     TRIMETH/SULFA <=20 SENSITIVE Sensitive     AMPICILLIN/SULBACTAM 16 INTERMEDIATE Intermediate     PIP/TAZO <=4 SENSITIVE Sensitive     Extended ESBL NEGATIVE Sensitive     * 40,000 COLONIES/mL ESCHERICHIA COLI  Blood Culture (routine x 2)     Status: Abnormal   Collection Time: 03/05/19  5:00 PM   Specimen: BLOOD  Result Value Ref Range Status   Specimen Description   Final    BLOOD BLOOD RIGHT HAND Performed at [facility], [place] [address], [place], [place] [address]    Special Requests   Final    BOTTLES DRAWN AEROBIC AND ANAEROBIC Blood Culture adequate volume Performed at [facility], [place] [address], [place], [place] [address]    Culture  Setup Time   Final    GRAM NEGATIVE RODS IN BOTH AEROBIC AND ANAEROBIC BOTTLES CRITICAL VALUE NOTED.  VALUE IS CONSISTENT WITH PREVIOUSLY REPORTED AND CALLED VALUE. Performed at [facility], [address] [place], [place] [address]    Culture (A)  Final    ESCHERICHIA COLI SUSCEPTIBILITIES PERFORMED ON PREVIOUS CULTURE WITHIN THE LAST 5 DAYS. Performed at [facility], [place] [address] [place], [place] [address]    Report Status 03/08/2019 FINAL  Final  Blood Culture (routine x 2)     Status: Abnormal   Collection Time: 03/05/19  5:02 PM   Specimen: BLOOD LEFT FOREARM    Result Value Ref Range Status   Specimen Description BLOOD LEFT FOREARM  Final   Special Requests   Final    BOTTLES DRAWN AEROBIC AND ANAEROBIC Blood Culture adequate volume   Culture  Setup Time   Final    GRAM NEGATIVE RODS IN BOTH AEROBIC AND ANAEROBIC  BOTTLES CRITICAL RESULT CALLED TO, READ BACK BY AND VERIFIED WITH: Hart Robinsons [id number] 03/06/2019 HNM Performed at [place] [address], [place], [place] [address]    Culture ESCHERICHIA COLI (A)  Final   Report Status 03/08/2019 FINAL  Final   Organism ID, Bacteria ESCHERICHIA COLI  Final      Susceptibility   Escherichia coli - MIC*    AMPICILLIN >=32 RESISTANT Resistant     CEFAZOLIN <=4 SENSITIVE Sensitive     CEFEPIME <=1 SENSITIVE Sensitive     CEFTAZIDIME <=1 SENSITIVE Sensitive     CEFTRIAXONE <=1 SENSITIVE Sensitive     CIPROFLOXACIN >=4 RESISTANT Resistant     GENTAMICIN <=1 SENSITIVE Sensitive     IMIPENEM <=0.25 SENSITIVE Sensitive     TRIMETH/SULFA <=20 SENSITIVE Sensitive     AMPICILLIN/SULBACTAM 16 INTERMEDIATE Intermediate     PIP/TAZO <=4 SENSITIVE Sensitive     Extended ESBL NEGATIVE Sensitive     * ESCHERICHIA COLI  Blood Culture ID Panel (Reflexed)     Status: Abnormal   Collection Time: 03/05/19  5:02 PM  Result Value Ref Range Status   Enterococcus species NOT DETECTED NOT DETECTED Final   Listeria monocytogenes NOT DETECTED NOT DETECTED Final   Staphylococcus species NOT DETECTED NOT DETECTED Final   Staphylococcus aureus (BCID) NOT DETECTED NOT DETECTED Final   Streptococcus species NOT DETECTED NOT DETECTED Final   Streptococcus agalactiae NOT DETECTED NOT DETECTED Final   Streptococcus pneumoniae NOT DETECTED NOT DETECTED Final   Streptococcus pyogenes NOT DETECTED NOT DETECTED Final   Acinetobacter baumannii NOT DETECTED NOT DETECTED Final   Enterobacteriaceae species DETECTED (A) NOT DETECTED Final    Comment: Enterobacteriaceae represent a large family of gram-negative  bacteria, not a single organism. CRITICAL RESULT CALLED TO, READ BACK BY AND VERIFIED WITH: SCOTT HALL PHARMD [id number] 03/06/2019 HNM    Enterobacter cloacae complex NOT DETECTED NOT DETECTED Final   Escherichia coli DETECTED (A) NOT DETECTED Final    Comment: CRITICAL RESULT CALLED TO, READ BACK BY AND VERIFIED WITH: [place] Robinsons PHARMD [id number] 03/06/2019 HNM    Klebsiella oxytoca NOT DETECTED NOT DETECTED Final   Klebsiella pneumoniae NOT DETECTED NOT DETECTED Final   Proteus species NOT DETECTED NOT DETECTED Final   Serratia marcescens NOT DETECTED NOT DETECTED Final   Carbapenem resistance NOT DETECTED NOT DETECTED Final   Haemophilus influenzae NOT DETECTED NOT DETECTED Final   Neisseria meningitidis NOT DETECTED NOT DETECTED Final   Pseudomonas aeruginosa NOT DETECTED NOT DETECTED Final   Candida albicans NOT DETECTED NOT DETECTED Final   Candida glabrata NOT DETECTED NOT DETECTED Final   Candida krusei NOT DETECTED NOT DETECTED Final   Candida parapsilosis NOT DETECTED NOT DETECTED Final   Candida tropicalis NOT DETECTED NOT DETECTED Final    Comment: Performed at [facility], [address] [place], [place] [address]  SARS Coronavirus 2 by RT PCR (hospital order, performed in [place] hospital lab) Nasopharyngeal Nasopharyngeal Swab     Status: None   Collection Time: 03/05/19  6:07 PM   Specimen: Nasopharyngeal Swab  Result Value Ref Range Status   SARS Coronavirus 2 NEGATIVE NEGATIVE Final    Comment: (NOTE) SARS-CoV-2 target nucleic acids are NOT DETECTED. The SARS-CoV-2 RNA is generally detectable in upper and lower respiratory specimens during the acute phase of infection. The lowest concentration of SARS-CoV-2 viral copies this assay can detect is 250 copies / mL. A negative result does not preclude SARS-CoV-2 infection and  should not be used as the sole basis for treatment or other patient management decisions.  A negative result may occur with improper  specimen collection / handling, submission of specimen other than nasopharyngeal swab, presence of viral mutation(s) within the areas targeted by this assay, and inadequate number of viral copies (<250 copies / mL). A negative result must be combined with clinical observations, patient history, and epidemiological information. Fact Sheet for Patients:   StrictlyIdeas.no Fact Sheet for Healthcare Providers: BankingDealers.co.za This test is not yet approved or cleared  by the [place] FDA and has been authorized for detection and/or diagnosis of SARS-CoV-2 by FDA under an Emergency Use Authorization (EUA).  This EUA will remain in effect (meaning this test can be used) for the duration of the COVID-19 declaration under Section 564(b)(1) of the Act, 21 U.S.C. section 360bbb-3(b)(1), unless the authorization is terminated or revoked sooner. Performed at [facility], [address] [place], [place] [address]   MRSA PCR Screening     Status: None   Collection Time: 03/05/19 10:52 PM   Specimen: Nasal Mucosa; Nasopharyngeal  Result Value Ref Range Status   MRSA by PCR NEGATIVE NEGATIVE Final    Comment:        The GeneXpert MRSA Assay (FDA approved for NASAL specimens only), is one component of a comprehensive MRSA colonization surveillance program. It is not intended to diagnose MRSA infection nor to guide or monitor treatment for MRSA infections. Performed at [facility], [address], [place], [place] [address]      Radiology Studies: Ct Angio Chest Pe W Or Wo Contrast  Result Date: 03/07/2019 CLINICAL DATA:  Shortness of breath. Elevated D-dimer. EXAM: CT ANGIOGRAPHY CHEST WITH CONTRAST TECHNIQUE: Multidetector CT imaging of the chest was performed using the standard protocol during bolus administration of intravenous contrast. Multiplanar CT image reconstructions and MIPs were obtained to evaluate  the vascular anatomy. CONTRAST:  80m OMNIPAQUE IOHEXOL 350 MG/ML SOLN COMPARISON:  Chest x-ray dated 03/07/2019 FINDINGS: Cardiovascular: Satisfactory opacification of the pulmonary arteries to the segmental level. No evidence of pulmonary embolism. Normal heart size. No pericardial effusion. Mediastinum/Nodes: No enlarged mediastinal, hilar, or axillary lymph nodes. Thyroid gland, trachea, and esophagus demonstrate no significant findings. Lungs/Pleura: Tiny intrapulmonary node along the minor fissure on the right of no significance. Peripheral atelectasis at the right lung base posteriorly. No effusions. Upper Abdomen: Negative. Musculoskeletal: No chest wall abnormality. No acute or significant osseous findings. Review of the MIP images confirms the above findings. IMPRESSION: No pulmonary emboli. Slight atelectasis at the right lung base posteriorly. Electronically Signed   By: JLorriane ShireM.D.   On: 03/07/2019 14:55   UKoreaVenous Img Lower Bilateral (dvt)  Result Date: 03/07/2019 CLINICAL DATA:  73year old with hypoxia, shortness of breath and elevated D-dimer. Above the knee amputation in the right lower extremity. EXAM: BILATERAL LOWER EXTREMITY VENOUS DOPPLER ULTRASOUND TECHNIQUE: Gray-scale sonography with graded compression, as well as color Doppler and duplex ultrasound were performed to evaluate the lower extremity deep venous systems from the level of the common femoral vein and including the common femoral, femoral, profunda femoral, popliteal and calf veins including the posterior tibial, peroneal and gastrocnemius veins when visible. The superficial great saphenous vein was also interrogated. Spectral Doppler was utilized to evaluate flow at rest and with distal augmentation maneuvers in the common femoral, femoral and popliteal veins. COMPARISON:  None. FINDINGS: RIGHT LOWER EXTREMITY Common Femoral Vein: No evidence of thrombus. Normal compressibility, respiratory phasicity and  response to  augmentation. Saphenofemoral Junction: No evidence of thrombus. Normal compressibility and flow on color Doppler imaging. Profunda Femoral Vein: No evidence of thrombus. Normal compressibility and flow on color Doppler imaging. Femoral Vein: No evidence of thrombus. Normal compressibility, respiratory phasicity and response to augmentation. Other: Above the knee amputation in the right lower extremity. Other Findings:  None. LEFT LOWER EXTREMITY Common Femoral Vein: No evidence of thrombus. Normal compressibility, respiratory phasicity and response to augmentation. Saphenofemoral Junction: No evidence of thrombus. Normal compressibility and flow on color Doppler imaging. Profunda Femoral Vein: No evidence of thrombus. Normal compressibility and flow on color Doppler imaging. Femoral Vein: No evidence of thrombus. Normal compressibility, respiratory phasicity and response to augmentation. Popliteal Vein: No evidence of thrombus. Normal compressibility, respiratory phasicity and response to augmentation. Calf Veins: No evidence of thrombus. Normal compressibility and flow on color Doppler imaging. Other Findings:  None. IMPRESSION: No evidence of deep venous thrombosis in either lower extremity. Electronically Signed   By: Markus Daft M.D.   On: 03/07/2019 15:46   Ct Renal Stone Study  Result Date: 03/08/2019 CLINICAL DATA:  73 year old with personal history of urinary tract calculi, presenting with generalized abdominal pain and fever. Surgical history includes appendectomy. EXAM: CT ABDOMEN AND PELVIS WITHOUT CONTRAST TECHNIQUE: Multidetector CT imaging of the abdomen and pelvis was performed following the standard protocol without IV contrast. COMPARISON:  None. FINDINGS: Lower chest: Respiratory motion blurred several of the images. Expected dependent atelectasis posteriorly in the lower lobes. Visualized lung bases otherwise clear. Heart size upper normal. RIGHT coronary artery atherosclerosis. Hepatobiliary:  Normal unenhanced appearance of the liver. High attenuation bile in the gallbladder. No calcified gallstones. No pericholecystic edema/inflammation. No biliary ductal dilation. Pancreas: Mildly atrophic without evidence of mass, ductal dilation, or inflammation. Spleen: Normal in size and appearance. Adrenals/Urinary Tract: Normal appearing adrenal glands. Approximate 6.8 x 5.5 cm simple cyst arising from the MEDIAL RIGHT kidney. Much smaller exophytic simple cyst arising from the lower pole of the RIGHT kidney. Within the limits of the unenhanced technique, no significant focal parenchymal abnormality involving either kidney. No urinary tract calculi. No hydronephrosis. Normal appearing urinary bladder. Stomach/Bowel: Stomach normal in appearance for the degree of distention. Diverticulum arising from the proximal transverse duodenum. Small bowel normal in appearance otherwise. Small lipoma involving the ileocecal valve. Entire colon decompressed. Descending and sigmoid colon diverticulosis without evidence of acute diverticulitis. Surgically absent appendix. Vascular/Lymphatic: Moderate aorto-iliofemoral atherosclerosis without evidence of aneurysm. The RIGHT common iliac and external iliac arteries are significantly smaller than those on the LEFT. No pathologic lymphadenopathy. Reproductive: Moderate prostate gland enlargement. Normal seminal vesicles. Other: RIGHT femoral hernia suspected. BILATERAL inguinal hernias containing fat. Musculoskeletal: Marked atrophy of the RIGHT gluteal muscles. Remote healed fractures involving the LEFT INFERIOR pubic ramus, the LEFT acetabulum. Degenerative changes and partial ankylosis of the RIGHT SI joint. Diffuse lumbar facet degenerative changes. Lower thoracic spondylosis. No acute findings. IMPRESSION: 1. No acute abnormalities involving the abdomen or pelvis. 2. High attenuation bile (sludge?) In the gallbladder without calcified gallstones or CT evidence of acute  cholecystitis. 3. Descending and sigmoid colon diverticulosis without evidence of acute diverticulitis. 4. Moderate prostate gland enlargement. 5. BILATERAL inguinal hernias containing fat. RIGHT femoral hernia suspected. 6. Marked atrophy of the RIGHT gluteal muscles. 7. Asymmetric size of the common and external iliac arteries, those on the LEFT much larger than those on the RIGHT. 8. Duodenal diverticulum. Aortic Atherosclerosis (ICD10-170.0) Electronically Signed   By: Sherran Needs.D.  On: 03/08/2019 15:10    Scheduled Meds:  enoxaparin (LOVENOX) injection  40 mg Subcutaneous Q24H   insulin aspart  0-9 Units Subcutaneous Q4H   ipratropium-albuterol  3 mL Nebulization TID   mouth rinse  15 mL Mouth Rinse BID   pantoprazole  40 mg Oral Daily   rosuvastatin  40 mg Oral q1800   Continuous Infusions:   ceFAZolin (ANCEF) IV 2 g (03/09/19 0541)     LOS: 4 days   Time spent: 45 minutes.  Lorella Nimrod, MD [facility] Pager [phone]  This record has been created using Dragon voice recognition software. Errors have been sought and corrected,but may not always be located. Such creation errors do not reflect on the standard of care.  If 7PM-7AM, please contact night-coverage [email or web address] Password [facility] 03/09/2019, 1:25 PM   This record has been created using Systems analyst. Errors have been sought and corrected,but may not always be located. Such creation errors do not reflect on the standard of care.

## 2019-03-10 DIAGNOSIS — R0602 Shortness of breath: Secondary | ICD-10-CM

## 2019-03-10 LAB — GLUCOSE, CAPILLARY
Glucose-Capillary: 257 mg/dL — ABNORMAL HIGH (ref 70–99)
Glucose-Capillary: 137 mg/dL — ABNORMAL HIGH (ref 70–99)
Glucose-Capillary: 148 mg/dL — ABNORMAL HIGH (ref 70–99)
Glucose-Capillary: 148 mg/dL — ABNORMAL HIGH (ref 70–99)
Glucose-Capillary: 165 mg/dL — ABNORMAL HIGH (ref 70–99)
Glucose-Capillary: 179 mg/dL — ABNORMAL HIGH (ref 70–99)
Glucose-Capillary: 180 mg/dL — ABNORMAL HIGH (ref 70–99)
Glucose-Capillary: 211 mg/dL — ABNORMAL HIGH (ref 70–99)
Glucose-Capillary: 218 mg/dL — ABNORMAL HIGH (ref 70–99)
Glucose-Capillary: 227 mg/dL — ABNORMAL HIGH (ref 70–99)
Glucose-Capillary: 232 mg/dL — ABNORMAL HIGH (ref 70–99)

## 2019-03-10 LAB — RENAL FUNCTION PANEL
Albumin: 2.6 g/dL — ABNORMAL LOW (ref 3.5–5.0)
Anion gap: 12 (ref 5–15)
BUN: 20 mg/dL (ref 8–23)
CO2: 24 mmol/L (ref 22–32)
Calcium: 8.5 mg/dL — ABNORMAL LOW (ref 8.9–10.3)
Chloride: 101 mmol/L (ref 98–111)
Creatinine, Ser: 1.06 mg/dL (ref 0.61–1.24)
GFR calc Af Amer: >60 mL/min (ref >60)
GFR calc non Af Amer: >60 mL/min (ref >60)
Glucose, Bld: 272 mg/dL — ABNORMAL HIGH (ref 70–99)
Phosphorus: 1.9 mg/dL — ABNORMAL LOW (ref 2.5–4.6)
Potassium: 3.2 mmol/L — ABNORMAL LOW (ref 3.5–5.1)
Sodium: 137 mmol/L (ref 135–145)

## 2019-03-10 LAB — CBC
HCT: 37.6 % — ABNORMAL LOW (ref 39.0–52.0)
Hemoglobin: 13 g/dL (ref 13.0–17.0)
MCH: 31.4 pg (ref 26.0–34.0)
MCHC: 34.6 g/dL (ref 30.0–36.0)
MCV: 90.8 fL (ref 80.0–100.0)
Platelets: 160 10*3/uL (ref 150–400)
RBC: 4.14 MIL/uL — ABNORMAL LOW (ref 4.22–5.81)
RDW: 13.3 % (ref 11.5–15.5)
WBC: 6.4 10*3/uL (ref 4.0–10.5)
nRBC: 0 % (ref 0.0–0.2)

## 2019-03-10 LAB — TROPONIN I (HIGH SENSITIVITY): Troponin I (High Sensitivity): 20 ng/L — ABNORMAL HIGH (ref <18)

## 2019-03-10 LAB — MAGNESIUM: Magnesium: 2.2 mg/dL (ref 1.7–2.4)

## 2019-03-10 MED ORDER — SODIUM CHLORIDE 0.9 % IV SOLN
INTRAVENOUS | Status: DC | PRN
  Administered 2019-03-10: 15:00:00 20 mL via INTRAVENOUS

## 2019-03-10 MED ORDER — IPRATROPIUM-ALBUTEROL 0.5-2.5 (3) MG/3ML IN SOLN: 3.0000 mL | RESPIRATORY_TRACT | Status: DC | PRN

## 2019-03-10 MED ORDER — INSULIN ASPART 100 UNIT/ML ~~LOC~~ SOLN
0.0000 [IU] | Freq: Three times a day (TID) | SUBCUTANEOUS | Status: DC
  Administered 2019-03-10 – 2019-03-11 (×3): 5 [IU] via SUBCUTANEOUS
  Filled 2019-03-10 (×3): qty 1

## 2019-03-10 MED ORDER — INSULIN ASPART 100 UNIT/ML ~~LOC~~ SOLN
0.0000 [IU] | Freq: Every day | SUBCUTANEOUS | Status: DC
  Administered 2019-03-10: 2 [IU] via SUBCUTANEOUS
  Filled 2019-03-10: qty 1

## 2019-03-10 MED ORDER — AMLODIPINE BESYLATE 10 MG PO TABS
10.0000 mg | ORAL_TABLET | Freq: Every day | ORAL | Status: DC
  Administered 2019-03-10 – 2019-03-11 (×2): 10 mg via ORAL
  Filled 2019-03-10 (×2): qty 1

## 2019-03-10 MED ORDER — POTASSIUM PHOSPHATES 15 MMOLE/5ML IV SOLN
30.0000 mmol | Freq: Once | INTRAVENOUS | Status: AC
  Administered 2019-03-10: 10:00:00 30 mmol via INTRAVENOUS
  Filled 2019-03-10: qty 10

## 2019-03-10 NOTE — Progress Notes (Signed)
Physical Therapy Treatment Patient Details Name: Carl Bryan MRN: [id number] DOB: Aug 21, 1945 Today's Date: 03/10/2019    History of Present Illness 73 y.o. male with medical history significant of dm2, HTN, HLD.  Pt has been weak recently with multiple falls, admitted to CCU with sepsis.  Pt had R hip fx in August and reports he has not used prosthetic much since (was NWBing initially)    PT Comments    Pt is making good progress towards goals with ability to ambulate short distances in room. Still limited due to endurance with O2 and appears to have limited insight regarding his limitation. Reviewed to perform short distances at home and [facility] for long distances while still recovering from acute illness. Will continue to progress/monitor as appropriate.  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 All mobility performed on room air.  Ambulation/Gait Ambulation/Gait assistance: Min guard Gait Distance (Feet): 60 Feet Assistive device: Rolling walker (2 wheeled) Gait Pattern/deviations: Step-to pattern     General Gait Details: ambulated in room 30'x2 with seated rest break in between bouts of exertion. Hop to gait pattern performed with RW and safe technique. Notable fatigue present, however pt reports  it was "easy." Post ambulation, O2 sats decreased to 85% and 86% respectively. Cues for pursed lip breathing with sats returning to 90%.    Stairs             Wheelchair Mobility    Modified Rankin (Stroke Patients Only)       Balance Overall balance assessment: Needs assistance Sitting-balance support: Feet supported;Feet unsupported Sitting balance-Leahy Scale: Fair     Standing balance support: Bilateral upper extremity supported;During functional activity Standing balance-Leahy Scale: Fair Standing balance comment: no overt LOB                            Cognition Arousal/Alertness: Awake/alert Behavior During Therapy: WFL for tasks assessed/performed Overall Cognitive Status: Within Functional Limits for tasks assessed                                        Exercises Other Exercises Other Exercises: Pt instructed in energy conservation strategies and use of shower stool for showering at home to support safety/independence with ADL and functional mobility    General Comments General comments (skin integrity, edema, etc.): O2 sats >95% on 2L O2 at rest and during activity; HR WNL      Pertinent Vitals/Pain Pain Assessment: No/denies pain    Home Living Family/patient expects to be discharged to:: Private residence Living Arrangements: Spouse/significant other Available Help at Discharge: Family;Available 24 hours/day Type of Home: House Home Access: Stairs to enter(has an enterance with no step ups as  well ) Entrance Stairs-Rails: (yes, but he uses his crutches) Home Layout: One level Home Equipment: Walker - 2 wheels;Crutches;Wheelchair - Sport and exercise psychologist Comments: has shower stool but hasn't used it    Prior Function Level of Independence: Independent with assistive device(s)      Comments: Pt reports that he has gotten around with crutches for years and still does well with these (drives, goes hunting, etc)  reports he rarely ever needs RW or w/c    PT Goals (current goals can now be found in the care plan section) Acute Rehab PT Goals Patient Stated Goal: go home PT Goal Formulation: With patient Time For Goal Achievement: 03/21/19 Potential to Achieve Goals: Good Progress towards PT goals: Progressing toward goals    Frequency    Min 2X/week      PT Plan Current plan remains appropriate    Co-evaluation              AM-PAC PT "6 Clicks" Mobility   Outcome Measure  Help needed turning from your back to your side while in a flat bed without using bedrails?: None Help needed moving from lying on your back to sitting on the side of a flat bed without using bedrails?: None Help needed moving to and from a bed to a chair (including a wheelchair)?: None Help needed standing up from a chair using your arms (e.g., wheelchair or bedside chair)?: None Help needed to walk in hospital room?: A Little Help needed climbing 3-5 steps with a railing? : A Lot 6 Click Score: 21    End of Session Equipment Utilized During Treatment: Gait belt;Oxygen Activity Tolerance: Patient limited by fatigue Patient left: in bed;with bed alarm set;with nursing/sitter in room Nurse Communication: Mobility status PT Visit Diagnosis: Muscle weakness (generalized) (M62.81);Difficulty in walking, not elsewhere classified (R26.2)     Time: 3491-7915 PT Time Calculation (min) (ACUTE ONLY): 23 min  Charges:  $Gait Training: 23-37 mins                     Greggory Stallion, [place], DPT [phone]    Vaishnavi Dalby 03/10/2019, 12:07 PM

## 2019-03-10 NOTE — Progress Notes (Signed)
Verbal order from Dr. Reesa Chew to discontinue cardiac monitoring.  Clarise Cruz, BSN

## 2019-03-10 NOTE — Progress Notes (Signed)
BS 227 at midnight, results unable to trasnfere

## 2019-03-10 NOTE — Discharge Summary (Signed)
Physician Discharge Summary  Carl Bryan JSE:[id number] DOB: 08-21-1945 DOA: 03/05/2019  PCP: Volney American, PA-C  Admit date: 03/05/2019 Discharge date: 03/11/2019  Admitted From: Home Disposition: Home  Recommendations for Outpatient Follow-up:  1. Follow up with PCP in 1-2 weeks 2. Please obtain BMP/CBC in one week 3. Please follow up on the following pending results: None  Home Health: Yes Equipment/Devices: None Discharge Condition: Stable CODE STATUS: Full Diet recommendation: Heart Healthy / Carb Modified   Brief/Interim Summary: Carl Bryan a 73 y.o.malewith medical history significant of dm2, HTN, HLD Status post traumatic amputation right AKA Presented withfallsat home has been very weak has been having multiple falls for the past few days. On arrival to ED he met sepsis criteria, febrile at 101, tachycardic, tachypneic and hypotensive.  He was initially started on cefepime and vancomycin and was given IV fluid according to sepsis protocol. Urine and blood cultures are growing E. Coli-antibiotics switched to meropenem, later switched to cefazolin according to sensitivity report. Repeat blood cultures negative. He was discharged home on Keflex for 3 more days to complete 10-day course.  He developed acute hypoxic respiratory failure during his stay in hospital.  PE was ruled out as he was having elevated D-dimer with CTA and lower extremity Dopplers which were negative.  Sepsis itself can cause hypoxia.  His saturation continued to improve and he was successfully weaned off from oxygen.  He also developed normal anion gap metabolic acidosis with hypokalemia and hyponatremia during his stay.  His urine studies with positive anion gap pointed towards distal RTA.  Nephrology was consulted and he was started on bicarb infusion.  He responded well and his electrolyte abnormalities resolved on discharge.  He also has elevated troponin on admission.  Did experience  some intermittent substernal chest pain which was thought to be due to GI origin.  Multiple EKG without any acute change and troponin continue to trend down.  It was thought to be due to demand ischemia.  He was also having elevated CK on arrival most likely due to fever.  CK normalized with IV hydration.  His home antihypertensives were held for few days because of softer blood pressure.  We restarted them a day before discharge and he will continue with them after discharge.  He will continue with his glipizide for diabetes on discharge.  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 I am sending you home on 3 more days of antibiotics, please finish all your medicine. Please follow-up with your primary care physician within 1 week.   Increase activity slowly   Complete by: As directed      Allergies as of 03/11/2019      Reactions   Doxycycline Calcium Rash   Tussionex Pennkinetic Er [hydrocod Polst-cpm Polst Er] Itching  Vytorin [ezetimibe-simvastatin]    Sleepy      Medication List    STOP taking these medications   mupirocin ointment 2 % Commonly known as: BACTROBAN     TAKE these medications   Accu-Chek Aviva Plus test strip Generic drug: glucose blood Check 1 time a day   Accu-Chek Aviva Plus w/Device Kit Test 1 time daily    allopurinol 300 MG tablet Commonly known as: ZYLOPRIM Take 1 tablet (300 mg total) by mouth daily.   amLODipine 10 MG tablet Commonly known as: NORVASC Take 1 tablet (10 mg total) by mouth daily.   cephALEXin 500 MG capsule Commonly known as: KEFLEX Take 1 capsule (500 mg total) by mouth 4 (four) times daily for 3 days.   cetirizine 10 MG tablet Commonly known as: ZYRTEC Take 1 tablet (10 mg total) by mouth daily.   glipiZIDE 5 MG tablet Commonly known as: GLUCOTROL Take 1 tablet (5 mg total) by mouth daily before breakfast.   losartan 100 MG tablet Commonly known as: COZAAR Take 1 tablet (100 mg total) by mouth daily.   metFORMIN 500 MG tablet Commonly known as: GLUCOPHAGE Take 2 tablets (1,000 mg total) by mouth 2 (two) times daily with a meal.   ondansetron 4 MG tablet Commonly known as: Zofran Take 1 tablet (4 mg total) by mouth every 8 (eight) hours as needed for nausea or vomiting.   pantoprazole 40 MG tablet Commonly known as: PROTONIX Take 1 tablet (40 mg total) by mouth daily. Start taking on: March 12, 2019   rosuvastatin 40 MG tablet Commonly known as: CRESTOR Take 1 tablet (40 mg total) by mouth daily.       Allergies  Allergen Reactions  . Doxycycline Calcium Rash  . Tussionex Pennkinetic Er [Hydrocod Polst-Cpm Polst Er] Itching  . Vytorin [Ezetimibe-Simvastatin]     Sleepy   Consultations:  Nephrology  Procedures/Studies: Dg Chest 2 View  Result Date: 03/07/2019 CLINICAL DATA:  Sepsis, fever, hypoxia and shortness of breath. EXAM: CHEST - 2 VIEW COMPARISON:  03/06/2019 FINDINGS: The heart size and mediastinal contours are within normal limits. Stable mild interstitial prominence in both lungs without evidence edema, focal airspace consolidation or pleural fluid. The visualized skeletal structures are unremarkable. IMPRESSION: Stable mild interstitial prominence. No focal infiltrate identified. Electronically Signed   By: Aletta Edouard  M.D.   On: 03/07/2019 09:04   Dg Abd 1 View  Result Date: 03/06/2019 CLINICAL DATA:  Abdominal distension EXAM: ABDOMEN - 1 VIEW COMPARISON:  12/05/2008 FINDINGS: Scattered large and small bowel gas is noted. No obstructive changes are seen. No free intraperitoneal air is noted. Degenerative changes of lumbar spine are seen. IMPRESSION: Scattered bowel gas without obstructive change. Electronically Signed   By: Inez Catalina M.D.   On: 03/06/2019 00:39   Ct Head Wo Contrast  Result Date: 03/05/2019 CLINICAL DATA:  Head trauma, multiple falls at home, incontinent, cervical spine trauma high clinical risk EXAM: CT HEAD WITHOUT CONTRAST CT CERVICAL SPINE WITHOUT CONTRAST TECHNIQUE: Multidetector CT imaging of the head and cervical spine was performed following the standard protocol without intravenous contrast. Multiplanar CT image reconstructions of the cervical spine were also generated. COMPARISON:  11/20/2018 FINDINGS: CT HEAD FINDINGS Brain: Generalized atrophy. Normal ventricular morphology. No midline shift or mass effect. Small vessel chronic ischemic changes of deep cerebral white matter. Otherwise normal appearance of brain parenchyma. No intracranial hemorrhage, mass lesion or evidence of acute infarction. No extra-axial fluid collections. Vascular: No hyperdense vessels. Skull: Intact  Sinuses/Orbits: Clear Other: N/A CT CERVICAL SPINE FINDINGS Alignment: Minimal retrolisthesis C4-C5. Remaining alignments normal Skull base and vertebrae: Osseous demineralization. Multilevel facet degenerative changes. Multilevel disc space narrowing and endplate spur formation diffusely throughout cervical spine. Vertebral body heights maintained. No fracture, additional subluxation, or bone destruction. Minimal scattered motion artifacts. Soft tissues and spinal canal: Prevertebral soft tissues normal thickness. Disc levels: Endplate spurs encroach upon the anterior aspect of the spinal canal and thecal sac at  multiple levels. Upper chest: Lung apices clear Other: N/A IMPRESSION: Atrophy with minimal small vessel chronic ischemic changes of deep cerebral white matter. No acute intracranial abnormalities. Degenerative disc and facet disease changes throughout cervical spine as above. No acute cervical spine abnormalities. Electronically Signed   By: Lavonia Dana M.D.   On: 03/05/2019 17:26   Ct Angio Chest Pe W Or Wo Contrast  Result Date: 03/07/2019 CLINICAL DATA:  Shortness of breath. Elevated D-dimer. EXAM: CT ANGIOGRAPHY CHEST WITH CONTRAST TECHNIQUE: Multidetector CT imaging of the chest was performed using the standard protocol during bolus administration of intravenous contrast. Multiplanar CT image reconstructions and MIPs were obtained to evaluate the vascular anatomy. CONTRAST:  2m OMNIPAQUE IOHEXOL 350 MG/ML SOLN COMPARISON:  Chest x-ray dated 03/07/2019 FINDINGS: Cardiovascular: Satisfactory opacification of the pulmonary arteries to the segmental level. No evidence of pulmonary embolism. Normal heart size. No pericardial effusion. Mediastinum/Nodes: No enlarged mediastinal, hilar, or axillary lymph nodes. Thyroid gland, trachea, and esophagus demonstrate no significant findings. Lungs/Pleura: Tiny intrapulmonary node along the minor fissure on the right of no significance. Peripheral atelectasis at the right lung base posteriorly. No effusions. Upper Abdomen: Negative. Musculoskeletal: No chest wall abnormality. No acute or significant osseous findings. Review of the MIP images confirms the above findings. IMPRESSION: No pulmonary emboli. Slight atelectasis at the right lung base posteriorly. Electronically Signed   By: JLorriane ShireM.D.   On: 03/07/2019 14:55   Ct Cervical Spine Wo Contrast  Result Date: 03/05/2019 CLINICAL DATA:  Head trauma, multiple falls at home, incontinent, cervical spine trauma high clinical risk EXAM: CT HEAD WITHOUT CONTRAST CT CERVICAL SPINE WITHOUT CONTRAST TECHNIQUE:  Multidetector CT imaging of the head and cervical spine was performed following the standard protocol without intravenous contrast. Multiplanar CT image reconstructions of the cervical spine were also generated. COMPARISON:  11/20/2018 FINDINGS: CT HEAD FINDINGS Brain: Generalized atrophy. Normal ventricular morphology. No midline shift or mass effect. Small vessel chronic ischemic changes of deep cerebral white matter. Otherwise normal appearance of brain parenchyma. No intracranial hemorrhage, mass lesion or evidence of acute infarction. No extra-axial fluid collections. Vascular: No hyperdense vessels. Skull: Intact Sinuses/Orbits: Clear Other: N/A CT CERVICAL SPINE FINDINGS Alignment: Minimal retrolisthesis C4-C5. Remaining alignments normal Skull base and vertebrae: Osseous demineralization. Multilevel facet degenerative changes. Multilevel disc space narrowing and endplate spur formation diffusely throughout cervical spine. Vertebral body heights maintained. No fracture, additional subluxation, or bone destruction. Minimal scattered motion artifacts. Soft tissues and spinal canal: Prevertebral soft tissues normal thickness. Disc levels: Endplate spurs encroach upon the anterior aspect of the spinal canal and thecal sac at multiple levels. Upper chest: Lung apices clear Other: N/A IMPRESSION: Atrophy with minimal small vessel chronic ischemic changes of deep cerebral white matter. No acute intracranial abnormalities. Degenerative disc and facet disease changes throughout cervical spine as above. No acute cervical spine abnormalities. Electronically Signed   By: MLavonia DanaM.D.   On: 03/05/2019 17:26   UK[place]Venous Img Lower Bilateral (dvt)  Result Date: 03/07/2019 CLINICAL DATA:  73year old  with hypoxia, shortness of breath and elevated D-dimer. Above the knee amputation in the right lower extremity. EXAM: BILATERAL LOWER EXTREMITY VENOUS DOPPLER ULTRASOUND TECHNIQUE: Gray-scale sonography with graded  compression, as well as color Doppler and duplex ultrasound were performed to evaluate the lower extremity deep venous systems from the level of the common femoral vein and including the common femoral, femoral, profunda femoral, popliteal and calf veins including the posterior tibial, peroneal and gastrocnemius veins when visible. The superficial great saphenous vein was also interrogated. Spectral Doppler was utilized to evaluate flow at rest and with distal augmentation maneuvers in the common femoral, femoral and popliteal veins. COMPARISON:  None. FINDINGS: RIGHT LOWER EXTREMITY Common Femoral Vein: No evidence of thrombus. Normal compressibility, respiratory phasicity and response to augmentation. Saphenofemoral Junction: No evidence of thrombus. Normal compressibility and flow on color Doppler imaging. Profunda Femoral Vein: No evidence of thrombus. Normal compressibility and flow on color Doppler imaging. Femoral Vein: No evidence of thrombus. Normal compressibility, respiratory phasicity and response to augmentation. Other: Above the knee amputation in the right lower extremity. Other Findings:  None. LEFT LOWER EXTREMITY Common Femoral Vein: No evidence of thrombus. Normal compressibility, respiratory phasicity and response to augmentation. Saphenofemoral Junction: No evidence of thrombus. Normal compressibility and flow on color Doppler imaging. Profunda Femoral Vein: No evidence of thrombus. Normal compressibility and flow on color Doppler imaging. Femoral Vein: No evidence of thrombus. Normal compressibility, respiratory phasicity and response to augmentation. Popliteal Vein: No evidence of thrombus. Normal compressibility, respiratory phasicity and response to augmentation. Calf Veins: No evidence of thrombus. Normal compressibility and flow on color Doppler imaging. Other Findings:  None. IMPRESSION: No evidence of deep venous thrombosis in either lower extremity. Electronically Signed   By: Markus Daft M.D.   On: 03/07/2019 15:46   Dg Chest Port 1 View  Result Date: 03/06/2019 CLINICAL DATA:  Dyspnea. EXAM: PORTABLE CHEST 1 VIEW COMPARISON:  03/06/2019 12:09 a.m. FINDINGS: Lungs are adequately inflated without focal airspace consolidation or effusion. Cardiomediastinal silhouette and remainder of the exam is unchanged. IMPRESSION: No active disease. Electronically Signed   By: Marin Olp M.D.   On: 03/06/2019 10:08   Dg Chest Port 1 View  Result Date: 03/06/2019 CLINICAL DATA:  Hypoxia EXAM: PORTABLE CHEST 1 VIEW COMPARISON:  Film from the previous day FINDINGS: Cardiac shadow is mildly enlarged but stable. Tortuosity of the thoracic aorta is noted accentuated by patient rotation to the right. The lungs are well aerated bilaterally. Slight increased interstitial changes are noted likely related to a degree of congestive failure. No sizable effusion is noted. No focal infiltrate is seen. No bony abnormality is noted. IMPRESSION: Increased interstitial changes consistent with a degree of congestive heart failure. Electronically Signed   By: Inez Catalina M.D.   On: 03/06/2019 00:37   Dg Chest Port 1 View  Result Date: 03/05/2019 CLINICAL DATA:  Fever. EXAM: PORTABLE CHEST 1 VIEW COMPARISON:  Chest x-ray dated 06/22/2014 FINDINGS: The heart size and mediastinal contours are within normal limits. Both lungs are clear. No acute bone abnormality. Degenerative changes of both shoulders. IMPRESSION: No active disease in the chest. Electronically Signed   By: Lorriane Shire M.D.   On: 03/05/2019 17:31   Ct Renal Stone Study  Result Date: 03/08/2019 CLINICAL DATA:  73 year old with personal history of urinary tract calculi, presenting with generalized abdominal pain and fever. Surgical history includes appendectomy. EXAM: CT ABDOMEN AND PELVIS WITHOUT CONTRAST TECHNIQUE: Multidetector CT imaging of the abdomen and pelvis was  performed following the standard protocol without IV contrast.  COMPARISON:  None. FINDINGS: Lower chest: Respiratory motion blurred several of the images. Expected dependent atelectasis posteriorly in the lower lobes. Visualized lung bases otherwise clear. Heart size upper normal. RIGHT coronary artery atherosclerosis. Hepatobiliary: Normal unenhanced appearance of the liver. High attenuation bile in the gallbladder. No calcified gallstones. No pericholecystic edema/inflammation. No biliary ductal dilation. Pancreas: Mildly atrophic without evidence of mass, ductal dilation, or inflammation. Spleen: Normal in size and appearance. Adrenals/Urinary Tract: Normal appearing adrenal glands. Approximate 6.8 x 5.5 cm simple cyst arising from the MEDIAL RIGHT kidney. Much smaller exophytic simple cyst arising from the lower pole of the RIGHT kidney. Within the limits of the unenhanced technique, no significant focal parenchymal abnormality involving either kidney. No urinary tract calculi. No hydronephrosis. Normal appearing urinary bladder. Stomach/Bowel: Stomach normal in appearance for the degree of distention. Diverticulum arising from the proximal transverse duodenum. Small bowel normal in appearance otherwise. Small lipoma involving the ileocecal valve. Entire colon decompressed. Descending and sigmoid colon diverticulosis without evidence of acute diverticulitis. Surgically absent appendix. Vascular/Lymphatic: Moderate aorto-iliofemoral atherosclerosis without evidence of aneurysm. The RIGHT common iliac and external iliac arteries are significantly smaller than those on the LEFT. No pathologic lymphadenopathy. Reproductive: Moderate prostate gland enlargement. Normal seminal vesicles. Other: RIGHT femoral hernia suspected. BILATERAL inguinal hernias containing fat. Musculoskeletal: Marked atrophy of the RIGHT gluteal muscles. Remote healed fractures involving the LEFT INFERIOR pubic ramus, the LEFT acetabulum. Degenerative changes and partial ankylosis of the RIGHT SI joint.  Diffuse lumbar facet degenerative changes. Lower thoracic spondylosis. No acute findings. IMPRESSION: 1. No acute abnormalities involving the abdomen or pelvis. 2. High attenuation bile (sludge?) In the gallbladder without calcified gallstones or CT evidence of acute cholecystitis. 3. Descending and sigmoid colon diverticulosis without evidence of acute diverticulitis. 4. Moderate prostate gland enlargement. 5. BILATERAL inguinal hernias containing fat. RIGHT femoral hernia suspected. 6. Marked atrophy of the RIGHT gluteal muscles. 7. Asymmetric size of the common and external iliac arteries, those on the LEFT much larger than those on the RIGHT. 8. Duodenal diverticulum. Aortic Atherosclerosis (ICD10-170.0) Electronically Signed   By: Evangeline Dakin M.D.   On: 03/08/2019 15:10   Subjective: Patient was feeling better when seen this morning.  Had no new complaints.  He would like to go home as soon as possible today as [birth year].  Discharge Exam: Vitals:   03/11/19 0418 03/11/19 0729  BP: 122/79 127/71  Pulse: 60 (!) 57  Resp: 18 18  Temp: 98.5 F (36.9 C) 98.4 F (36.9 C)  SpO2: 93% 91%   Vitals:   03/10/19 1603 03/10/19 2010 03/11/19 0418 03/11/19 0729  BP: (!) 145/75 131/68 122/79 127/71  Pulse: (!) 59 66 60 (!) 57  Resp: '18 18 18 18  ' Temp: 97.9 F (36.6 C) 98.2 F (36.8 C) 98.5 F (36.9 C) 98.4 F (36.9 C)  TempSrc: Oral Oral Oral Oral  SpO2: 93% 92% 93% 91%  Weight:    72.5 kg  Height:       General: Pt is alert, awake, not in acute distress Cardiovascular: RRR, S1/S2 +, no rubs, no gallops Respiratory: CTA bilaterally, no wheezing, no rhonchi Abdominal: Soft, NT, ND, bowel sounds + Extremities: no edema, no cyanosis.  Right AKA.   The results of significant diagnostics from this hospitalization (including imaging, microbiology, ancillary and laboratory) are listed below for reference.     Microbiology: Recent Results (from the past 240 hour(s))  Urine  culture  Status: Abnormal   Collection Time: 03/05/19  4:46 PM   Specimen: In/Out Cath Urine  Result Value Ref Range Status   Specimen Description   Final    IN/OUT CATH URINE Performed at [facility], [address] [place], [place] [address]    Special Requests   Final    NONE Performed at [facility], [place], [place] [address]    Culture 40,000 COLONIES/mL ESCHERICHIA COLI (A)  Final   Report Status 03/07/2019 FINAL  Final   Organism ID, Bacteria ESCHERICHIA COLI (A)  Final      Susceptibility   Escherichia coli - MIC*    AMPICILLIN >=32 RESISTANT Resistant     CEFAZOLIN <=4 SENSITIVE Sensitive     CEFTRIAXONE <=1 SENSITIVE Sensitive     CIPROFLOXACIN >=4 RESISTANT Resistant     GENTAMICIN <=1 SENSITIVE Sensitive     IMIPENEM <=0.25 SENSITIVE Sensitive     NITROFURANTOIN <=16 SENSITIVE Sensitive     TRIMETH/SULFA <=20 SENSITIVE Sensitive     AMPICILLIN/SULBACTAM 16 INTERMEDIATE Intermediate     PIP/TAZO <=4 SENSITIVE Sensitive     Extended ESBL NEGATIVE Sensitive     * 40,000 COLONIES/mL ESCHERICHIA COLI  Blood Culture (routine x 2)     Status: Abnormal   Collection Time: 03/05/19  5:00 PM   Specimen: BLOOD  Result Value Ref Range Status   Specimen Description   Final    BLOOD BLOOD RIGHT HAND Performed at [facility], [place] [address], [place], [place] [address]    Special Requests   Final    BOTTLES DRAWN AEROBIC AND ANAEROBIC Blood Culture adequate volume Performed at [facility], [place] [address] [place], [place] [address]    Culture  Setup Time   Final    GRAM NEGATIVE RODS IN BOTH AEROBIC AND ANAEROBIC BOTTLES CRITICAL VALUE NOTED.  VALUE IS CONSISTENT WITH PREVIOUSLY REPORTED AND CALLED VALUE. Performed at [facility], [address] [place], [place] [address]    Culture (A)  Final    ESCHERICHIA COLI SUSCEPTIBILITIES PERFORMED ON PREVIOUS CULTURE WITHIN THE LAST 5 DAYS. Performed at [facility], [place] [address] [place], [place] [address]    Report Status 03/08/2019 FINAL  Final  Blood Culture (routine x 2)     Status: Abnormal   Collection Time: 03/05/19  5:02 PM   Specimen: BLOOD LEFT FOREARM  Result Value Ref Range Status   Specimen Description BLOOD LEFT FOREARM  Final   Special Requests   Final    BOTTLES DRAWN AEROBIC AND ANAEROBIC Blood Culture adequate volume   Culture  Setup Time   Final    GRAM NEGATIVE RODS IN BOTH AEROBIC AND ANAEROBIC BOTTLES CRITICAL RESULT CALLED TO, READ BACK BY AND VERIFIED WITHHart Robinsons [facility] [id number] 03/06/2019 HNM Performed at [facility], [place] [address], [place], [place] [address]    Culture ESCHERICHIA COLI (A)  Final   Report Status 03/08/2019 FINAL  Final   Organism ID, Bacteria ESCHERICHIA COLI  Final      Susceptibility   Escherichia coli - MIC*    AMPICILLIN >=32 RESISTANT Resistant     CEFAZOLIN <=4 SENSITIVE Sensitive     CEFEPIME <=1 SENSITIVE Sensitive     CEFTAZIDIME <=1 SENSITIVE Sensitive     CEFTRIAXONE <=1 SENSITIVE Sensitive     CIPROFLOXACIN >=4 RESISTANT Resistant     GENTAMICIN <=1 SENSITIVE Sensitive     IMIPENEM <=0.25 SENSITIVE  Sensitive     TRIMETH/SULFA <=20 SENSITIVE Sensitive     AMPICILLIN/SULBACTAM 16 INTERMEDIATE Intermediate     PIP/TAZO <=4 SENSITIVE Sensitive     Extended ESBL NEGATIVE Sensitive     * ESCHERICHIA COLI  Blood Culture ID Panel (Reflexed)     Status: Abnormal   Collection Time: 03/05/19  5:02 PM  Result Value Ref Range Status   Enterococcus species NOT DETECTED NOT DETECTED Final   Listeria monocytogenes NOT DETECTED NOT DETECTED Final   Staphylococcus species NOT DETECTED NOT DETECTED Final   Staphylococcus aureus (BCID) NOT DETECTED NOT DETECTED Final   Streptococcus species NOT DETECTED NOT DETECTED Final   Streptococcus agalactiae NOT DETECTED NOT DETECTED Final   Streptococcus pneumoniae NOT DETECTED NOT DETECTED Final   Streptococcus pyogenes NOT DETECTED  NOT DETECTED Final   Acinetobacter baumannii NOT DETECTED NOT DETECTED Final   Enterobacteriaceae species DETECTED (A) NOT DETECTED Final    Comment: Enterobacteriaceae represent a large family of gram-negative bacteria, not a single organism. CRITICAL RESULT CALLED TO, READ BACK BY AND VERIFIED WITH: SCOTT HALL PHARMD [id number] 03/06/2019 HNM    Enterobacter cloacae complex NOT DETECTED NOT DETECTED Final   Escherichia coli DETECTED (A) NOT DETECTED Final    Comment: CRITICAL RESULT CALLED TO, READ BACK BY AND VERIFIED WITH: Hart Robinsons PHARMD [id number] 03/06/2019 HNM    Klebsiella oxytoca NOT DETECTED NOT DETECTED Final   Klebsiella pneumoniae NOT DETECTED NOT DETECTED Final   Proteus species NOT DETECTED NOT DETECTED Final   Serratia marcescens NOT DETECTED NOT DETECTED Final   Carbapenem resistance NOT DETECTED NOT DETECTED Final   Haemophilus influenzae NOT DETECTED NOT DETECTED Final   Neisseria meningitidis NOT DETECTED NOT DETECTED Final   Pseudomonas aeruginosa NOT DETECTED NOT DETECTED Final   Candida albicans NOT DETECTED NOT DETECTED Final   Candida glabrata NOT DETECTED NOT DETECTED Final   Candida krusei NOT DETECTED NOT DETECTED Final   Candida parapsilosis NOT DETECTED NOT DETECTED Final   Candida tropicalis NOT DETECTED NOT DETECTED Final    Comment: Performed at [facility], [address] [place], [place] [address]  SARS Coronavirus 2 by RT PCR (hospital order, performed in [place] hospital lab) Nasopharyngeal Nasopharyngeal Swab     Status: None   Collection Time: 03/05/19  6:07 PM   Specimen: Nasopharyngeal Swab  Result Value Ref Range Status   SARS Coronavirus 2 NEGATIVE NEGATIVE Final    Comment: (NOTE) SARS-CoV-2 target nucleic acids are NOT DETECTED. The SARS-CoV-2 RNA is generally detectable in upper and lower respiratory specimens during the acute phase of infection. The lowest concentration of SARS-CoV-2 viral copies this assay can detect is  250 copies / mL. A negative result does not preclude SARS-CoV-2 infection and should not be used as the sole basis for treatment or other patient management decisions.  A negative result may occur with improper specimen collection / handling, submission of specimen other than nasopharyngeal swab, presence of viral mutation(s) within the areas targeted by this assay, and inadequate number of viral copies (<250 copies / mL). A negative result must be combined with clinical observations, patient history, and epidemiological information. Fact Sheet for Patients:   StrictlyIdeas.no Fact Sheet for Healthcare Providers: BankingDealers.co.za This test is not yet approved or cleared  by the [place] FDA and has been authorized for detection and/or diagnosis of SARS-CoV-2 by FDA under an Emergency Use Authorization (EUA).  This EUA will remain in effect (meaning this test can be used) for the  duration of the COVID-19 declaration under Section 564(b)(1) of the Act, 21 U.S.C. section 360bbb-3(b)(1), unless the authorization is terminated or revoked sooner. Performed at [facility], [address] [place], [place] [address]   MRSA PCR Screening     Status: None   Collection Time: 03/05/19 10:52 PM   Specimen: Nasal Mucosa; Nasopharyngeal  Result Value Ref Range Status   MRSA by PCR NEGATIVE NEGATIVE Final    Comment:        The GeneXpert MRSA Assay (FDA approved for NASAL specimens only), is one component of a comprehensive MRSA colonization surveillance program. It is not intended to diagnose MRSA infection nor to guide or monitor treatment for MRSA infections. Performed at [facility], [address] [place], [place] [address]   Culture, blood (single) w Reflex to ID Panel     Status: None (Preliminary result)   Collection Time: 03/09/19  3:33 PM   Specimen: BLOOD  Result Value Ref Range Status   Specimen  Description BLOOD RIGHT HAND  Final   Special Requests   Final    BOTTLES DRAWN AEROBIC AND ANAEROBIC Blood Culture results may not be optimal due to an excessive volume of blood received in culture bottles   Culture   Final    NO GROWTH 2 DAYS Performed at [facility], [address] [place], [place] [address]    Report Status PENDING  Incomplete     Labs: BNP (last 3 results) No results for input(s): BNP in the last 8760 hours. Basic Metabolic Panel: Recent Labs  Lab 03/06/19 0419 03/07/19 0552 03/08/19 0420 03/09/19 0411 03/10/19 0417 03/11/19 0429  NA 135 133* 131* 137 137 136  K 3.5 3.2* 3.0* 3.7 3.2* 3.7  CL 105 103 101 101 101 102  CO2 21* 18* 17* '22 24 23  ' GLUCOSE 168* 139* 147* 158* 272* 211*  BUN 25* 25* '21 17 20 19  ' CREATININE 1.24 1.26* 1.16 0.92 1.06 0.93  CALCIUM 9.0 8.7* 8.6* 8.5* 8.5* 9.0  MG 2.0 2.0 2.0 1.9 2.2 2.4  PHOS 3.1  --  1.8* 3.0 1.9* 2.7   Liver Function Tests: Recent Labs  Lab 03/05/19 1659 03/06/19 0419 03/07/19 0552 03/08/19 0420 03/09/19 0411 03/10/19 0417 03/11/19 0429  AST '31 31 25 23  ' --   --   --   ALT '24 22 23 22  ' --   --   --   ALKPHOS 104 54 59 56  --   --   --   BILITOT 3.0* 1.5* 1.7* 1.4*  --   --   --   PROT 7.7 6.1* 5.9* 5.6*  --   --   --   ALBUMIN 4.1 3.0* 2.7* 2.6* 2.6* 2.6* 2.8*   No results for input(s): LIPASE, AMYLASE in the last 168 hours. Recent Labs  Lab 03/05/19 2159  AMMONIA 21   CBC: Recent Labs  Lab 03/05/19 1659 03/06/19 0419 03/07/19 0552 03/08/19 0420 03/09/19 0411 03/10/19 0417  WBC 13.6* 17.7* 11.3* 9.7 7.7 6.4  NEUTROABS 12.3*  --   --   --   --   --   HGB 15.5 13.2 12.4* 12.1* 12.1* 13.0  HCT 44.6 38.7* 35.4* 33.7* 35.2* 37.6*  MCV 91.4 91.9 89.2 88.7 90.3 90.8  PLT 173 154 137* 140* 130* 160   Cardiac Enzymes: Recent Labs  Lab 03/05/19 2159 03/06/19 0419 03/07/19 0552  CKTOTAL 1,141* 725* 226   BNP: Invalid input(s): POCBNP CBG: Recent Labs  Lab  03/10/19 0811 03/10/19 1158 03/10/19 1714 03/10/19 2117 03/11/19 0800  GLUCAP 179* 211* 232* 262* 221*   D-Dimer No results for input(s): DDIMER in the last 72 hours. Hgb A1c No results for input(s): HGBA1C in the last 72 hours. Lipid Profile No results for input(s): CHOL, HDL, LDLCALC, TRIG, CHOLHDL, LDLDIRECT in the last 72 hours. Thyroid function studies No results for input(s): TSH, T4TOTAL, T3FREE, THYROIDAB in the last 72 hours.  Invalid input(s): FREET3 Anemia work up No results for input(s): VITAMINB12, FOLATE, FERRITIN, TIBC, IRON, RETICCTPCT in the last 72 hours. Urinalysis    Component Value Date/Time   COLORURINE YELLOW (A) 03/08/2019 0803   APPEARANCEUR HAZY (A) 03/08/2019 0803   APPEARANCEUR Clear 09/19/2018 1615   LABSPEC 1.011 03/08/2019 0803   PHURINE 6.0 03/08/2019 0803   GLUCOSEU 50 (A) 03/08/2019 0803   HGBUR MODERATE (A) 03/08/2019 0803   BILIRUBINUR NEGATIVE 03/08/2019 0803   BILIRUBINUR Negative 09/19/2018 1615   KETONESUR 20 (A) 03/08/2019 0803   PROTEINUR 100 (A) 03/08/2019 0803   NITRITE NEGATIVE 03/08/2019 0803   LEUKOCYTESUR TRACE (A) 03/08/2019 0803   Sepsis Labs Invalid input(s): PROCALCITONIN,  WBC,  LACTICIDVEN Microbiology Recent Results (from the past 240 hour(s))  Urine culture     Status: Abnormal   Collection Time: 03/05/19  4:46 PM   Specimen: In/Out Cath Urine  Result Value Ref Range Status   Specimen Description   Final    IN/OUT CATH URINE Performed at [facility], [address] [place], [place] [address]    Special Requests   Final    NONE Performed at [facility], [address] [place], [place] [address]    Culture 40,000 COLONIES/mL ESCHERICHIA COLI (A)  Final   Report Status 03/07/2019 FINAL  Final   Organism ID, Bacteria ESCHERICHIA COLI (A)  Final      Susceptibility   Escherichia coli - MIC*    AMPICILLIN >=32 RESISTANT Resistant     CEFAZOLIN <=4 SENSITIVE Sensitive     CEFTRIAXONE  <=1 SENSITIVE Sensitive     CIPROFLOXACIN >=4 RESISTANT Resistant     GENTAMICIN <=1 SENSITIVE Sensitive     IMIPENEM <=0.25 SENSITIVE Sensitive     NITROFURANTOIN <=16 SENSITIVE Sensitive     TRIMETH/SULFA <=20 SENSITIVE Sensitive     AMPICILLIN/SULBACTAM 16 INTERMEDIATE Intermediate     PIP/TAZO <=4 SENSITIVE Sensitive     Extended ESBL NEGATIVE Sensitive     * 40,000 COLONIES/mL ESCHERICHIA COLI  Blood Culture (routine x 2)     Status: Abnormal   Collection Time: 03/05/19  5:00 PM   Specimen: BLOOD  Result Value Ref Range Status   Specimen Description   Final    BLOOD BLOOD RIGHT HAND Performed at [facility], [place] [address] [place], [place] [address]    Special Requests   Final    BOTTLES DRAWN AEROBIC AND ANAEROBIC Blood Culture adequate volume Performed at [facility], [place] [address], [place], [place] [address]    Culture  Setup Time   Final    GRAM NEGATIVE RODS IN BOTH AEROBIC AND ANAEROBIC BOTTLES CRITICAL VALUE NOTED.  VALUE IS CONSISTENT WITH PREVIOUSLY REPORTED AND CALLED VALUE. Performed at [facility], [address] [place], [place] [address]    Culture (A)  Final    ESCHERICHIA COLI SUSCEPTIBILITIES PERFORMED ON PREVIOUS CULTURE WITHIN THE LAST 5 DAYS. Performed at [facility], [place] [address], [place], [place] [address]  Report Status 03/08/2019 FINAL  Final  Blood Culture (routine x 2)     Status: Abnormal   Collection Time: 03/05/19  5:02 PM   Specimen: BLOOD LEFT FOREARM  Result Value Ref Range Status   Specimen Description BLOOD LEFT FOREARM  Final   Special Requests   Final    BOTTLES DRAWN AEROBIC AND ANAEROBIC Blood Culture adequate volume   Culture  Setup Time   Final    GRAM NEGATIVE RODS IN BOTH AEROBIC AND ANAEROBIC BOTTLES CRITICAL RESULT CALLED TO, READ BACK BY AND VERIFIED WITH: [place] 1610 03/06/2019 HNM Performed at [facility], [place] [address], [place], [place] [address]    Culture  ESCHERICHIA COLI (A)  Final   Report Status 03/08/2019 FINAL  Final   Organism ID, Bacteria ESCHERICHIA COLI  Final      Susceptibility   Escherichia coli - MIC*    AMPICILLIN >=32 RESISTANT Resistant     CEFAZOLIN <=4 SENSITIVE Sensitive     CEFEPIME <=1 SENSITIVE Sensitive     CEFTAZIDIME <=1 SENSITIVE Sensitive     CEFTRIAXONE <=1 SENSITIVE Sensitive     CIPROFLOXACIN >=4 RESISTANT Resistant     GENTAMICIN <=1 SENSITIVE Sensitive     IMIPENEM <=0.25 SENSITIVE Sensitive     TRIMETH/SULFA <=20 SENSITIVE Sensitive     AMPICILLIN/SULBACTAM 16 INTERMEDIATE Intermediate     PIP/TAZO <=4 SENSITIVE Sensitive     Extended ESBL NEGATIVE Sensitive     * ESCHERICHIA COLI  Blood Culture ID Panel (Reflexed)     Status: Abnormal   Collection Time: 03/05/19  5:02 PM  Result Value Ref Range Status   Enterococcus species NOT DETECTED NOT DETECTED Final   Listeria monocytogenes NOT DETECTED NOT DETECTED Final   Staphylococcus species NOT DETECTED NOT DETECTED Final   Staphylococcus aureus (BCID) NOT DETECTED NOT DETECTED Final   Streptococcus species NOT DETECTED NOT DETECTED Final   Streptococcus agalactiae NOT DETECTED NOT DETECTED Final   Streptococcus pneumoniae NOT DETECTED NOT DETECTED Final   Streptococcus pyogenes NOT DETECTED NOT DETECTED Final   Acinetobacter baumannii NOT DETECTED NOT DETECTED Final   Enterobacteriaceae species DETECTED (A) NOT DETECTED Final    Comment: Enterobacteriaceae represent a large family of gram-negative bacteria, not a single organism. CRITICAL RESULT CALLED TO, READ BACK BY AND VERIFIED WITH: SCOTT HALL PHARMD [id number] 03/06/2019 HNM    Enterobacter cloacae complex NOT DETECTED NOT DETECTED Final   Escherichia coli DETECTED (A) NOT DETECTED Final    Comment: CRITICAL RESULT CALLED TO, READ BACK BY AND VERIFIED WITH: Hart Robinsons PHARMD [id number] 03/06/2019 HNM    Klebsiella oxytoca NOT DETECTED NOT DETECTED Final   Klebsiella pneumoniae NOT DETECTED NOT DETECTED  Final   Proteus species NOT DETECTED NOT DETECTED Final   Serratia marcescens NOT DETECTED NOT DETECTED Final   Carbapenem resistance NOT DETECTED NOT DETECTED Final   Haemophilus influenzae NOT DETECTED NOT DETECTED Final   Neisseria meningitidis NOT DETECTED NOT DETECTED Final   Pseudomonas aeruginosa NOT DETECTED NOT DETECTED Final   Candida albicans NOT DETECTED NOT DETECTED Final   Candida glabrata NOT DETECTED NOT DETECTED Final   Candida krusei NOT DETECTED NOT DETECTED Final   Candida parapsilosis NOT DETECTED NOT DETECTED Final   Candida tropicalis NOT DETECTED NOT DETECTED Final    Comment: Performed at [facility], [address] [place], [place] [address]  SARS Coronavirus 2 by RT PCR (hospital order, performed in [facility] hospital lab) Nasopharyngeal Nasopharyngeal Swab  Status: None   Collection Time: 03/05/19  6:07 PM   Specimen: Nasopharyngeal Swab  Result Value Ref Range Status   SARS Coronavirus 2 NEGATIVE NEGATIVE Final    Comment: (NOTE) SARS-CoV-2 target nucleic acids are NOT DETECTED. The SARS-CoV-2 RNA is generally detectable in upper and lower respiratory specimens during the acute phase of infection. The lowest concentration of SARS-CoV-2 viral copies this assay can detect is 250 copies / mL. A negative result does not preclude SARS-CoV-2 infection and should not be used as the sole basis for treatment or other patient management decisions.  A negative result may occur with improper specimen collection / handling, submission of specimen other than nasopharyngeal swab, presence of viral mutation(s) within the areas targeted by this assay, and inadequate number of viral copies (<250 copies / mL). A negative result must be combined with clinical observations, patient history, and epidemiological information. Fact Sheet for Patients:   StrictlyIdeas.no Fact Sheet for Healthcare  Providers: BankingDealers.co.za This test is not yet approved or cleared  by the [place] FDA and has been authorized for detection and/or diagnosis of SARS-CoV-2 by FDA under an Emergency Use Authorization (EUA).  This EUA will remain in effect (meaning this test can be used) for the duration of the COVID-19 declaration under Section 564(b)(1) of the Act, 21 U.S.C. section 360bbb-3(b)(1), unless the authorization is terminated or revoked sooner. Performed at [facility], [address] [place], [place] [address]   MRSA PCR Screening     Status: None   Collection Time: 03/05/19 10:52 PM   Specimen: Nasal Mucosa; Nasopharyngeal  Result Value Ref Range Status   MRSA by PCR NEGATIVE NEGATIVE Final    Comment:        The GeneXpert MRSA Assay (FDA approved for NASAL specimens only), is one component of a comprehensive MRSA colonization surveillance program. It is not intended to diagnose MRSA infection nor to guide or monitor treatment for MRSA infections. Performed at [facility], [address] [place], [place] [address]   Culture, blood (single) w Reflex to ID Panel     Status: None (Preliminary result)   Collection Time: 03/09/19  3:33 PM   Specimen: BLOOD  Result Value Ref Range Status   Specimen Description BLOOD RIGHT HAND  Final   Special Requests   Final    BOTTLES DRAWN AEROBIC AND ANAEROBIC Blood Culture results may not be optimal due to an excessive volume of blood received in culture bottles   Culture   Final    NO GROWTH 2 DAYS Performed at [facility], [address], [place], [place] [address]    Report Status PENDING  Incomplete    Time coordinating discharge: Over 30 minutes  SIGNED:  Lorella Nimrod, MD  [facility] 03/11/2019, 10:59 AM Pager [phone]  If 7PM-7AM, please contact night-coverage [email or web address] Password [email or web address]  This record has been created using [employer]rap[employer], sports. Errors have been sought and corrected,but may not always be located. Such creation errors do not reflect on the standard of care.

## 2019-03-10 NOTE — Progress Notes (Signed)
PROGRESS NOTE    Carl Bryan  JSH:[id number] DOB: [birth year] DOA: 03/05/2019 PCP: Volney American, PA-C   Brief Narrative:  Carl Bryan is a 73 y.o. male with medical history significant of dm2, HTN, HLD Status post traumatic amputation  right AKA Presented with falls at home has been very weak has been having multiple falls for the past few days. On arrival to ED he met sepsis criteria, febrile at 101, tachycardic, tachypneic and hypotensive.  He was initially started on cefepime and vancomycin and was given IV fluid according to sepsis protocol. Urine and blood cultures are growing E. Coli-antibiotics switched to meropenem, later switched to cefazolin according to sensitivity report.  Subjective: Patient was feeling better when seen during morning rounds.  He feels better and would like to spend 1 more day before going home tomorrow.  Assessment & Plan:   Active Problems:   Pure hypercholesterolemia   Diabetes mellitus associated with hormonal etiology ([place])   Traumatic amputation of right leg above knee (HCC)   Essential (primary) hypertension   CAD (coronary artery disease)   Sepsis ([place])   Acute lower UTI   Acute metabolic encephalopathy   Elevated troponin   Rhabdomyolysis   Hypokalemia   Hyponatremia   Urinary tract infection with hematuria   Multiple falls   Hypoxia  Sepsis secondary to UTI.  Both urine and blood cultures are growing E. Coli with good sensitivity.  Repeat blood cultures negative. Procalcitonin elevated at 2.98. Continue to have low-grade fever. Continue to require oxygen. -Place meropenem with cefazolin according to sensitivity report.  Patient will need 2 weeks of antibiotics. -Continue monitoring. -Can go home tomorrow morning on Keflex to complete a 2-week course.  Acute hypoxic respiratory failure.  Patient continued to have increasing oxygen requirement.  Repeated two-view chest x-ray this morning which was negative for any infiltrate or  pulmonary edema. Wells score for PE was 1.5 which shows low risk but Geneva modified score was 6 which is consistent with moderate risk of PE.  D-dimer elevated above 2500, CTA and lower extremity Dopplers are negative for any PE or DVT. D-dimer can be elevated with sepsis. Hypoxia can be due to sepsis. ABG shows normal pH with PO2 of 73 on 5 L.  His oxygen requirement is improving now saturating in mid 90s on 2 L. -Continue to monitor. -Ambulate with pulse ox today.  Normal anion gap metabolic acidosis with hypokalemia and hyponatremia. Improved today. Patient started developing worsening acidosis with decreased in bicarb and becoming hypokalemic and hyponatremic despite replacing electrolytes. Urine anion gap is positive at 17 which points towards RTA type I or II. most likely type I(distal) as urine pH is 6. -Renal stone studies were negative. -Was placed on bicarb infusion by nephrology yesterday and responded well. -Bicarb infusion was discontinued yesterday. -Nephrology was consulted-appreciate their recommendations.  Hypokalemia/hypophosphatemia.  Potassium of 3.2 with phosphorus of 1.9.  Magnesium normal. Replete electrolytes and monitor.  Transient substernal chest pain.  Most likely GI in origin as responded well to Maalox.  EKG remained normal. -Add Protonix. -We will repeat troponin-it was 20 which was improved from his original of 85.  Elevated troponin.  History of CAD.  Most likely demand.  No chest pain.  Diabetes.  Uncontrolled as A1c is 8.0. -Continue CBG monitoring with SSI.  Rhabdomyolysis.  Most likely due to fever.  CK within normal limit now. -Continue IV hydration and monitor.  Acute encephalopathy.  Most likely metabolic secondary to bacteremia.  Resolved and patient was alert and oriented.  Hypertension.  Mildly elevated blood pressure today. -Keep holding home meds-can be really started once blood pressure elevated.  Traumatic amputation of right leg  above knee ([place]). Chronic and stable.  Electrolyte abnormalities.  Secondary to sepsis.  Hypokalemia and mild hyponatremia today.  RTA can be a possibility. -Continue to monitor-replete as needed.  Dyslipidemia. -Continue home meds.  Objective: Vitals:   03/09/19 1954 03/10/19 0010 03/10/19 0805 03/10/19 0814  BP:  135/70  (!) 147/72  Pulse:  61 61 62  Resp:  '18 18 18  ' Temp:  97.9 F (36.6 C)  97.7 F (36.5 C)  TempSrc:  Oral    SpO2: 94% 93% 94% 95%  Weight:      Height:        Intake/Output Summary (Last 24 hours) at 03/10/2019 1249 Last data filed at 03/10/2019 0900 Gross per 24 hour  Intake 640 ml  Output 3200 ml  Net -2560 ml   Filed Weights   03/05/19 1951 03/05/19 2350 03/07/19 1600  Weight: 78 kg 71.2 kg 75.8 kg    Examination:  General exam: Appears calm and comfortable.  Respiratory system: Clear to auscultation. Respiratory effort normal, mild tachypnea. Cardiovascular system: Regular rate and rhythm, no JVD, murmurs, rubs, gallops or clicks. No pedal edema. Gastrointestinal system: Abdomen is nondistended, soft and nontender. No organomegaly or masses felt. Normal bowel sounds heard. Central nervous system: Alert and oriented. No focal neurological deficits. Extremities: Right AKA. Skin: No rashes, lesions or ulcers Psychiatry: Judgement and insight appear normal. Mood & affect appropriate.   DVT prophylaxis: Lovenox Code Status: Full Family Communication: No family at bedside. Disposition Plan: Pending improvement.  Consultants:  PCCM Nephrology  Procedures:   Antimicrobials:  Meropenem.  Started on 11/26-11/28 Cefazolin. 11/28-  Data Reviewed: I have personally reviewed following labs and imaging studies  CBC: Recent Labs  Lab 03/05/19 1659 03/06/19 0419 03/07/19 0552 03/08/19 0420 03/09/19 0411 03/10/19 0417  WBC 13.6* 17.7* 11.3* 9.7 7.7 6.4  NEUTROABS 12.3*  --   --   --   --   --   HGB 15.5 13.2 12.4* 12.1* 12.1* 13.0  HCT  44.6 38.7* 35.4* 33.7* 35.2* 37.6*  MCV 91.4 91.9 89.2 88.7 90.3 90.8  PLT 173 154 137* 140* 130* 517   Basic Metabolic Panel: Recent Labs  Lab 03/06/19 0419 03/07/19 0552 03/08/19 0420 03/09/19 0411 03/10/19 0417  NA 135 133* 131* 137 137  K 3.5 3.2* 3.0* 3.7 3.2*  CL 105 103 101 101 101  CO2 21* 18* 17* 22 24  GLUCOSE 168* 139* 147* 158* 272*  BUN 25* 25* '21 17 20  ' CREATININE 1.24 1.26* 1.16 0.92 1.06  CALCIUM 9.0 8.7* 8.6* 8.5* 8.5*  MG 2.0 2.0 2.0 1.9 2.2  PHOS 3.1  --  1.8* 3.0 1.9*   GFR: Estimated Creatinine Clearance: 55.2 mL/min (by C-G formula based on SCr of 1.06 mg/dL). Liver Function Tests: Recent Labs  Lab 03/05/19 1659 03/06/19 0419 03/07/19 0552 03/08/19 0420 03/09/19 0411 03/10/19 0417  AST '31 31 25 23  ' --   --   ALT '24 22 23 22  ' --   --   ALKPHOS 104 54 59 56  --   --   BILITOT 3.0* 1.5* 1.7* 1.4*  --   --   PROT 7.7 6.1* 5.9* 5.6*  --   --   ALBUMIN 4.1 3.0* 2.7* 2.6* 2.6* 2.6*   No results for input(s): LIPASE,  AMYLASE in the last 168 hours. Recent Labs  Lab 03/05/19 2159  AMMONIA 21   Coagulation Profile: Recent Labs  Lab 03/05/19 1659  INR 1.2   Cardiac Enzymes: Recent Labs  Lab 03/05/19 2159 03/06/19 0419 03/07/19 0552  CKTOTAL 1,141* 725* 226   BNP (last 3 results) No results for input(s): PROBNP in the last 8760 hours. HbA1C: No results for input(s): HGBA1C in the last 72 hours. CBG: Recent Labs  Lab 03/08/19 1222 03/08/19 1711 03/09/19 1704 03/09/19 2012 03/10/19 0406  GLUCAP 154* 181* 171* 163* 257*   Lipid Profile: No results for input(s): CHOL, HDL, LDLCALC, TRIG, CHOLHDL, LDLDIRECT in the last 72 hours. Thyroid Function Tests: No results for input(s): TSH, T4TOTAL, FREET4, T3FREE, THYROIDAB in the last 72 hours. Anemia Panel: No results for input(s): VITAMINB12, FOLATE, FERRITIN, TIBC, IRON, RETICCTPCT in the last 72 hours. Sepsis Labs: Recent Labs  Lab 03/05/19 1659 03/05/19 2220 03/08/19 1115    PROCALCITON 2.98  --   --   LATICACIDVEN 3.6* 1.4 0.8    Recent Results (from the past 240 hour(s))  Urine culture     Status: Abnormal   Collection Time: 03/05/19  4:46 PM   Specimen: In/Out Cath Urine  Result Value Ref Range Status   Specimen Description   Final    IN/OUT CATH URINE Performed at [facility], [address] [place], [place] [address]    Special Requests   Final    NONE Performed at [facility], [address] [place], [place] [address]    Culture 40,000 COLONIES/mL ESCHERICHIA COLI (A)  Final   Report Status 03/07/2019 FINAL  Final   Organism ID, Bacteria ESCHERICHIA COLI (A)  Final      Susceptibility   Escherichia coli - MIC*    AMPICILLIN >=32 RESISTANT Resistant     CEFAZOLIN <=4 SENSITIVE Sensitive     CEFTRIAXONE <=1 SENSITIVE Sensitive     CIPROFLOXACIN >=4 RESISTANT Resistant     GENTAMICIN <=1 SENSITIVE Sensitive     IMIPENEM <=0.25 SENSITIVE Sensitive     NITROFURANTOIN <=16 SENSITIVE Sensitive     TRIMETH/SULFA <=20 SENSITIVE Sensitive     AMPICILLIN/SULBACTAM 16 INTERMEDIATE Intermediate     PIP/TAZO <=4 SENSITIVE Sensitive     Extended ESBL NEGATIVE Sensitive     * 40,000 COLONIES/mL ESCHERICHIA COLI  Blood Culture (routine x 2)     Status: Abnormal   Collection Time: 03/05/19  5:00 PM   Specimen: BLOOD  Result Value Ref Range Status   Specimen Description   Final    BLOOD BLOOD RIGHT HAND Performed at [facility], [place] [address] [place], [place] [address]    Special Requests   Final    BOTTLES DRAWN AEROBIC AND ANAEROBIC Blood Culture adequate volume Performed at [facility], [place] [address] [place], [place] [address]    Culture  Setup Time   Final    GRAM NEGATIVE RODS IN BOTH AEROBIC AND ANAEROBIC BOTTLES CRITICAL VALUE NOTED.  VALUE IS CONSISTENT WITH PREVIOUSLY REPORTED AND CALLED VALUE. Performed at [facility], [address] [place], [place] [address]    Culture (A)  Final     ESCHERICHIA COLI SUSCEPTIBILITIES PERFORMED ON PREVIOUS CULTURE WITHIN THE LAST 5 DAYS. Performed at [facility], [place] [address] [place],  [address]    Report Status 03/08/2019 FINAL  Final  Blood Culture (routine x 2)     Status: Abnormal   Collection Time:  03/05/19  5:02 PM   Specimen: BLOOD LEFT FOREARM  Result Value Ref Range Status   Specimen Description BLOOD LEFT FOREARM  Final   Special Requests   Final    BOTTLES DRAWN AEROBIC AND ANAEROBIC Blood Culture adequate volume   Culture  Setup Time   Final    GRAM NEGATIVE RODS IN BOTH AEROBIC AND ANAEROBIC BOTTLES CRITICAL RESULT CALLED TO, READ BACK BY AND VERIFIED WITH: Babcock [id number] 03/06/2019 HNM Performed at [facility], [place] [address] [place], [place] [address]    Culture ESCHERICHIA COLI (A)  Final   Report Status 03/08/2019 FINAL  Final   Organism ID, Bacteria ESCHERICHIA COLI  Final      Susceptibility   Escherichia coli - MIC*    AMPICILLIN >=32 RESISTANT Resistant     CEFAZOLIN <=4 SENSITIVE Sensitive     CEFEPIME <=1 SENSITIVE Sensitive     CEFTAZIDIME <=1 SENSITIVE Sensitive     CEFTRIAXONE <=1 SENSITIVE Sensitive     CIPROFLOXACIN >=4 RESISTANT Resistant     GENTAMICIN <=1 SENSITIVE Sensitive     IMIPENEM <=0.25 SENSITIVE Sensitive     TRIMETH/SULFA <=20 SENSITIVE Sensitive     AMPICILLIN/SULBACTAM 16 INTERMEDIATE Intermediate     PIP/TAZO <=4 SENSITIVE Sensitive     Extended ESBL NEGATIVE Sensitive     * ESCHERICHIA COLI  Blood Culture ID Panel (Reflexed)     Status: Abnormal   Collection Time: 03/05/19  5:02 PM  Result Value Ref Range Status   Enterococcus species NOT DETECTED NOT DETECTED Final   Listeria monocytogenes NOT DETECTED NOT DETECTED Final   Staphylococcus species NOT DETECTED NOT DETECTED Final   Staphylococcus aureus (BCID) NOT DETECTED NOT DETECTED Final   Streptococcus species NOT DETECTED NOT DETECTED Final   Streptococcus agalactiae NOT DETECTED NOT  DETECTED Final   Streptococcus pneumoniae NOT DETECTED NOT DETECTED Final   Streptococcus pyogenes NOT DETECTED NOT DETECTED Final   Acinetobacter baumannii NOT DETECTED NOT DETECTED Final   Enterobacteriaceae species DETECTED (A) NOT DETECTED Final    Comment: Enterobacteriaceae represent a large family of gram-negative bacteria, not a single organism. CRITICAL RESULT CALLED TO, READ BACK BY AND VERIFIED WITH: SCOTT HALL PHARMD [id number] 03/06/2019 HNM    Enterobacter cloacae complex NOT DETECTED NOT DETECTED Final   Escherichia coli DETECTED (A) NOT DETECTED Final    Comment: CRITICAL RESULT CALLED TO, READ BACK BY AND VERIFIED WITH: Hart Robinsons PHARMD [id number] 03/06/2019 HNM    Klebsiella oxytoca NOT DETECTED NOT DETECTED Final   Klebsiella pneumoniae NOT DETECTED NOT DETECTED Final   Proteus species NOT DETECTED NOT DETECTED Final   Serratia marcescens NOT DETECTED NOT DETECTED Final   Carbapenem resistance NOT DETECTED NOT DETECTED Final   Haemophilus influenzae NOT DETECTED NOT DETECTED Final   Neisseria meningitidis NOT DETECTED NOT DETECTED Final   Pseudomonas aeruginosa NOT DETECTED NOT DETECTED Final   Candida albicans NOT DETECTED NOT DETECTED Final   Candida glabrata NOT DETECTED NOT DETECTED Final   Candida krusei NOT DETECTED NOT DETECTED Final   Candida parapsilosis NOT DETECTED NOT DETECTED Final   Candida tropicalis NOT DETECTED NOT DETECTED Final    Comment: Performed at [facility], [address] [place], [place] [address]  SARS Coronavirus 2 by RT PCR (hospital order, performed in [place] hospital lab) Nasopharyngeal Nasopharyngeal Swab     Status: None   Collection Time: 03/05/19  6:07 PM   Specimen: Nasopharyngeal Swab  Result Value Ref Range  Status   SARS Coronavirus 2 NEGATIVE NEGATIVE Final    Comment: (NOTE) SARS-CoV-2 target nucleic acids are NOT DETECTED. The SARS-CoV-2 RNA is generally detectable in upper and lower respiratory specimens during  the acute phase of infection. The lowest concentration of SARS-CoV-2 viral copies this assay can detect is 250 copies / mL. A negative result does not preclude SARS-CoV-2 infection and should not be used as the sole basis for treatment or other patient management decisions.  A negative result may occur with improper specimen collection / handling, submission of specimen other than nasopharyngeal swab, presence of viral mutation(s) within the areas targeted by this assay, and inadequate number of viral copies (<250 copies / mL). A negative result must be combined with clinical observations, patient history, and epidemiological information. Fact Sheet for Patients:   StrictlyIdeas.no Fact Sheet for Healthcare Providers: BankingDealers.co.za This test is not yet approved or cleared  by the [place] FDA and has been authorized for detection and/or diagnosis of SARS-CoV-2 by FDA under an Emergency Use Authorization (EUA).  This EUA will remain in effect (meaning this test can be used) for the duration of the COVID-19 declaration under Section 564(b)(1) of the Act, 21 U.S.C. section 360bbb-3(b)(1), unless the authorization is terminated or revoked sooner. Performed at [facility], [address] [place], [place] [address]   MRSA PCR Screening     Status: None   Collection Time: 03/05/19 10:52 PM   Specimen: Nasal Mucosa; Nasopharyngeal  Result Value Ref Range Status   MRSA by PCR NEGATIVE NEGATIVE Final    Comment:        The GeneXpert MRSA Assay (FDA approved for NASAL specimens only), is one component of a comprehensive MRSA colonization surveillance program. It is not intended to diagnose MRSA infection nor to guide or monitor treatment for MRSA infections. Performed at [facility], [address] [place], [place] [address]   Culture, blood (single) w Reflex to ID Panel     Status: None (Preliminary  result)   Collection Time: 03/09/19  3:33 PM   Specimen: BLOOD  Result Value Ref Range Status   Specimen Description BLOOD RIGHT HAND  Final   Special Requests   Final    BOTTLES DRAWN AEROBIC AND ANAEROBIC Blood Culture results may not be optimal due to an excessive volume of blood received in culture bottles   Culture   Final    NO GROWTH < 24 HOURS Performed at [facility], [address], [place], [place] [address]    Report Status PENDING  Incomplete     Radiology Studies: Ct Renal Stone Study  Result Date: 03/08/2019 CLINICAL DATA:  73 year old with personal history of urinary tract calculi, presenting with generalized abdominal pain and fever. Surgical history includes appendectomy. EXAM: CT ABDOMEN AND PELVIS WITHOUT CONTRAST TECHNIQUE: Multidetector CT imaging of the abdomen and pelvis was performed following the standard protocol without IV contrast. COMPARISON:  None. FINDINGS: Lower chest: Respiratory motion blurred several of the images. Expected dependent atelectasis posteriorly in the lower lobes. Visualized lung bases otherwise clear. Heart size upper normal. RIGHT coronary artery atherosclerosis. Hepatobiliary: Normal unenhanced appearance of the liver. High attenuation bile in the gallbladder. No calcified gallstones. No pericholecystic edema/inflammation. No biliary ductal dilation. Pancreas: Mildly atrophic without evidence of mass, ductal dilation, or inflammation. Spleen: Normal in size and appearance. Adrenals/Urinary Tract: Normal appearing adrenal glands. Approximate 6.8 x 5.5 cm simple cyst arising from the MEDIAL RIGHT kidney. Much smaller exophytic simple cyst arising  from the lower pole of the RIGHT kidney. Within the limits of the unenhanced technique, no significant focal parenchymal abnormality involving either kidney. No urinary tract calculi. No hydronephrosis. Normal appearing urinary bladder. Stomach/Bowel: Stomach normal in appearance for the degree  of distention. Diverticulum arising from the proximal transverse duodenum. Small bowel normal in appearance otherwise. Small lipoma involving the ileocecal valve. Entire colon decompressed. Descending and sigmoid colon diverticulosis without evidence of acute diverticulitis. Surgically absent appendix. Vascular/Lymphatic: Moderate aorto-iliofemoral atherosclerosis without evidence of aneurysm. The RIGHT common iliac and external iliac arteries are significantly smaller than those on the LEFT. No pathologic lymphadenopathy. Reproductive: Moderate prostate gland enlargement. Normal seminal vesicles. Other: RIGHT femoral hernia suspected. BILATERAL inguinal hernias containing fat. Musculoskeletal: Marked atrophy of the RIGHT gluteal muscles. Remote healed fractures involving the LEFT INFERIOR pubic ramus, the LEFT acetabulum. Degenerative changes and partial ankylosis of the RIGHT SI joint. Diffuse lumbar facet degenerative changes. Lower thoracic spondylosis. No acute findings. IMPRESSION: 1. No acute abnormalities involving the abdomen or pelvis. 2. High attenuation bile (sludge?) In the gallbladder without calcified gallstones or CT evidence of acute cholecystitis. 3. Descending and sigmoid colon diverticulosis without evidence of acute diverticulitis. 4. Moderate prostate gland enlargement. 5. BILATERAL inguinal hernias containing fat. RIGHT femoral hernia suspected. 6. Marked atrophy of the RIGHT gluteal muscles. 7. Asymmetric size of the common and external iliac arteries, those on the LEFT much larger than those on the RIGHT. 8. Duodenal diverticulum. Aortic Atherosclerosis (ICD10-170.0) Electronically Signed   By: Evangeline Dakin M.D.   On: 03/08/2019 15:10    Scheduled Meds:  enoxaparin (LOVENOX) injection  40 mg Subcutaneous Q24H   insulin aspart  0-9 Units Subcutaneous Q4H   mouth rinse  15 mL Mouth Rinse BID   pantoprazole  40 mg Oral Daily   rosuvastatin  40 mg Oral q1800   Continuous  Infusions:   ceFAZolin (ANCEF) IV Stopped (03/10/19 0547)   potassium PHOSPHATE IVPB (in mmol) 30 mmol (03/10/19 1000)     LOS: 5 days   Time spent: 35 minutes.  Lorella Nimrod, MD [facility] Pager [phone]  This record has been created using Dragon voice recognition software. Errors have been sought and corrected,but may not always be located. Such creation errors do not reflect on the standard of care.  If 7PM-7AM, please contact night-coverage [email or web address] Password [facility] 03/10/2019, 12:49 PM   This record has been created using Systems analyst. Errors have been sought and corrected,but may not always be located. Such creation errors do not reflect on the standard of care.

## 2019-03-10 NOTE — Progress Notes (Signed)
Inpatient Diabetes Program Recommendations  AACE/ADA: New Consensus Statement on Inpatient Glycemic Control (2015)  Target Ranges:  Prepandial:   less than 140 mg/dL      Peak postprandial:   less than 180 mg/dL (1-2 hours)      Critically ill patients:  140 - 180 mg/dL   Results for JAYVION, STEFANSKI (MRN [id number]) as of 03/10/2019 13:12  Ref. Range 03/10/2019 00:08 03/10/2019 04:06 03/10/2019 08:11 03/10/2019 11:58  Glucose-Capillary Latest Ref Range: 70 - 99 mg/dL 227 (H)  3 units NOVOLOG  257 (H)  5 units NOVOLOG  179 (H)  2 units NOVOLOG  211 (H)  3 units NOVOLOG      Home DM Meds: Glipizide 5 mg Daily        Metformin 1000 mg BID  Current Orders: Novolog Sensitive Correction Scale/ SSI (0-9 units) Q4 hours     MD- Note CBGs >200 mg/dl  Please consider increasing Novolog SSi to the Moderate scale (0-15 units)    --Will follow patient during hospitalization--  Wyn Quaker RN, MSN, CDE Diabetes Coordinator Inpatient Glycemic Control Team Team Pager: [phone] (8a-5p)

## 2019-03-10 NOTE — Evaluation (Signed)
Occupational Therapy Evaluation Patient Details Name: Carl Bryan MRN: [id number] DOB: 06-08-1945 Today's Date: 03/10/2019    History of Present Illness 73 y.o. male with medical history significant of dm2, HTN, HLD.  Pt has been weak recently with multiple falls, admitted to CCU with sepsis.  Pt had R hip fx in August and reports he has not used prosthetic much since (was NWBing initially)   Clinical Impression   Pt seen for OT evaluation this date. Prior to hospital admission, pt was modified independent, driving, hunting, and using crutches for mobility.  Pt lives with his spouse.  Currently pt demonstrates impairments in balance and activity tolerance requiring 2L supplemental O2, CGA for functional transfers, and Min A for LB dressing/bathing. Pt instructed in ECS and use of shower stool to minimize falls risk and promote safety/independence with ADL while minimizing over exertion/SOB. Pt verbalized understanding and would benefit from skilled OT to address noted impairments and functional limitations (see below for any additional details) in order to maximize safety and independence while minimizing falls risk and caregiver burden.  Upon hospital discharge, recommend pt discharge with HHOT.    Follow Up Recommendations  Home health OT    Equipment Recommendations  None recommended by OT    Recommendations for Other Services       Precautions / Restrictions Precautions Precautions: Fall Restrictions Weight Bearing Restrictions: No Other Position/Activity Restrictions: hx R AKA from 1970's      Mobility Bed Mobility   Bed Mobility: Supine to Sit;Sit to Supine     Supine to sit: Supervision Sit to supine: Supervision   General bed mobility comments: no difficulty noted  Transfers Overall transfer level: Needs assistance Equipment used: Rolling walker (2 wheeled) Transfers: Sit to/from Stand Sit to Stand: Min guard         General transfer comment: no significant  difficulty to perform from std height bed    Balance Overall balance assessment: Needs assistance Sitting-balance support: Feet supported;Feet unsupported Sitting balance-Leahy Scale: Fair     Standing balance support: Bilateral upper extremity supported;During functional activity Standing balance-Leahy Scale: Fair Standing balance comment: no overt LOB                           ADL either performed or assessed with clinical judgement   ADL Overall ADL's : Needs assistance/impaired                                       General ADL Comments: Min A for seated LB dressing, CGA for functional ADL transfers using RW     Vision Baseline Vision/History: No visual deficits Patient Visual Report: No change from baseline Vision Assessment?: No apparent visual deficits     Perception     Praxis      Pertinent Vitals/Pain Pain Assessment: No/denies pain     Hand Dominance     Extremity/Trunk Assessment Upper Extremity Assessment Upper Extremity Assessment: Overall WFL for tasks assessed   Lower Extremity Assessment Lower Extremity Assessment: Overall WFL for tasks assessed(hx R AKA from 1970's)   Cervical / Trunk Assessment Cervical / Trunk Assessment: Normal   Communication Communication Communication: No difficulties   Cognition Arousal/Alertness: Awake/alert Behavior During Therapy: WFL for tasks assessed/performed Overall Cognitive Status: Within Functional Limits for tasks assessed  General Comments  O2 sats >95% on 2L O2 at rest and during activity; HR WNL    Exercises Other Exercises Other Exercises: Pt instructed in energy conservation strategies and use of shower stool for showering at home to support safety/independence with ADL and functional mobility   Shoulder Instructions      Home Living Family/patient expects to be discharged to:: Private residence Living Arrangements:  Spouse/significant other Available Help at Discharge: Family;Available 24 hours/day Type of Home: House Home Access: Stairs to enter(has an enterance with no step ups as well ) Entrance Stairs-Number of Steps: 2 Entrance Stairs-Rails: (yes, but he uses his crutches) Home Layout: One level     Bathroom Shower/Tub: Occupational psychologist: Handicapped height     Home Equipment: Environmental consultant - 2 wheels;Crutches;Wheelchair - Biomedical scientist Comments: has shower stool but hasn't used it      Prior Functioning/Environment Level of Independence: Independent with assistive device(s)        Comments: Pt reports that he has gotten around with crutches for years and still does well with these (drives, goes hunting, etc) reports he rarely ever needs RW or w/c         OT Problem List: Impaired balance (sitting and/or standing);Decreased activity tolerance      OT Treatment/Interventions: Self-care/ADL training;Therapeutic exercise;Therapeutic activities;Energy conservation;DME and/or AE instruction;Patient/family education;Balance training    OT Goals(Current goals can be found in the care plan section) Acute Rehab OT Goals Patient Stated Goal: go home OT Goal Formulation: With patient Time For Goal Achievement: 03/24/19 Potential to Achieve Goals: Good ADL Goals Pt Will Transfer to Toilet: with supervision;ambulating(comfort height toilet, LRAD for amb) Pt Will Perform Tub/Shower Transfer: with supervision;ambulating;shower seat;rolling walker Additional ADL Goal #1: Pt will verbalize plan to implement at least 1 learned ECS to support safety/independence/falls prevention.  OT Frequency: Min 1X/week   Barriers to D/C:            Co-evaluation              AM-PAC OT "6 Clicks" Daily Activity     Outcome Measure Help from another person eating meals?: None Help from another person taking care of personal grooming?: None Help from another person  toileting, which includes using toliet, bedpan, or urinal?: A Little Help from another person bathing (including washing, rinsing, drying)?: A Little Help from another person to put on and taking off regular upper body clothing?: None Help from another person to put on and taking off regular lower body clothing?: A Little 6 Click Score: 21   End of Session Equipment Utilized During Treatment: Gait belt;Rolling walker  Activity Tolerance: Patient tolerated treatment well Patient left: in bed;with call bell/phone within reach;with bed alarm set  OT Visit Diagnosis: Other abnormalities of gait and mobility (R26.89);Repeated falls (R29.6)                Time: 4259-5638 OT Time Calculation (min): 19 min Charges:  OT General Charges $OT Visit: 1 Visit OT Evaluation $OT Eval Low Complexity: 1 Low OT Treatments $Self Care/Home Management : 8-22 mins  Jeni Salles, MPH, MS, OTR/L ascom [phone] 03/10/19, 10:50 AM

## 2019-03-10 NOTE — Care Management Important Message (Signed)
Important Message  Patient Details  Name: OAKLEY KOSSMAN MRN: [id number] Date of Birth: 1946/01/27   Medicare Important Message Given:  Yes     Juliann Pulse A Cyrah Mclamb 03/10/2019, 11:39 AM

## 2019-03-11 DIAGNOSIS — R0603 Acute respiratory distress: Secondary | ICD-10-CM

## 2019-03-11 LAB — RENAL FUNCTION PANEL
Albumin: 2.8 g/dL — ABNORMAL LOW (ref 3.5–5.0)
Anion gap: 11 (ref 5–15)
BUN: 19 mg/dL (ref 8–23)
CO2: 23 mmol/L (ref 22–32)
Calcium: 9 mg/dL (ref 8.9–10.3)
Chloride: 102 mmol/L (ref 98–111)
Creatinine, Ser: 0.93 mg/dL (ref 0.61–1.24)
GFR calc Af Amer: >60 mL/min (ref >60)
GFR calc non Af Amer: >60 mL/min (ref >60)
Glucose, Bld: 211 mg/dL — ABNORMAL HIGH (ref 70–99)
Phosphorus: 2.7 mg/dL (ref 2.5–4.6)
Potassium: 3.7 mmol/L (ref 3.5–5.1)
Sodium: 136 mmol/L (ref 135–145)

## 2019-03-11 LAB — GLUCOSE, CAPILLARY
Glucose-Capillary: 232 mg/dL — ABNORMAL HIGH (ref 70–99)
Glucose-Capillary: 262 mg/dL — ABNORMAL HIGH (ref 70–99)
Glucose-Capillary: 221 mg/dL — ABNORMAL HIGH (ref 70–99)
Glucose-Capillary: 216 mg/dL — ABNORMAL HIGH (ref 70–99)

## 2019-03-11 LAB — MAGNESIUM: Magnesium: 2.4 mg/dL (ref 1.7–2.4)

## 2019-03-11 MED ORDER — PANTOPRAZOLE SODIUM 40 MG PO TBEC: 40.0000 mg | DELAYED_RELEASE_TABLET | Freq: Every day | ORAL | 2 refills | Status: DC

## 2019-03-11 MED ORDER — CEPHALEXIN 500 MG PO CAPS: 500.0000 mg | ORAL_CAPSULE | Freq: Four times a day (QID) | ORAL | 0 refills | Status: AC

## 2019-03-11 NOTE — Plan of Care (Signed)

## 2019-03-12 ENCOUNTER — Telehealth: Payer: Self-pay

## 2019-03-12 NOTE — Telephone Encounter (Signed)
I have made the 1st attempt to contact the patient or family member in charge, in order to follow up from recently being discharged from the hospital. I was unable to leave a message on voicemail but I will make another attempt at a different time.  

## 2019-03-14 LAB — CULTURE, BLOOD (SINGLE): Culture: NO GROWTH

## 2019-03-18 ENCOUNTER — Other Ambulatory Visit: Payer: Self-pay

## 2019-03-19 ENCOUNTER — Ambulatory Visit (INDEPENDENT_AMBULATORY_CARE_PROVIDER_SITE_OTHER): Payer: Medicare HMO | Admitting: Family Medicine

## 2019-03-19 ENCOUNTER — Encounter: Payer: Self-pay | Admitting: Family Medicine

## 2019-03-19 ENCOUNTER — Other Ambulatory Visit: Payer: Self-pay

## 2019-03-19 VITALS — BP 135/65 | HR 62 | Temp 97.8°F | Ht 63.0 in | Wt 162.0 lb

## 2019-03-19 DIAGNOSIS — N390 Urinary tract infection, site not specified: Secondary | ICD-10-CM | POA: Diagnosis not present

## 2019-03-19 DIAGNOSIS — R7989 Other specified abnormal findings of blood chemistry: Secondary | ICD-10-CM | POA: Diagnosis not present

## 2019-03-19 DIAGNOSIS — Z8619 Personal history of other infectious and parasitic diseases: Secondary | ICD-10-CM | POA: Diagnosis not present

## 2019-03-19 NOTE — Progress Notes (Signed)
BP 135/65   Pulse 62   Temp 97.8 F (36.6 C) (Oral)   Ht _0  (1.6 m)   Wt 162 lb (73.5 kg)   SpO2 96%   BMI 28.70 kg/m    Subjective:    Patient ID: Carl Bryan, male    DOB: 09/08/1945, 73 y.o.   MRN: [id number]  HPI: Carl Bryan is a 73 y.o. male  Chief Complaint  Patient presents with  . Hospitalization Follow-up   Patient presenting today for hospital f/u for UTI with sepsis. Presented to ED 11/25 for frequent falls at home and disorientation. He was found to be febrile, tachycardic, hypotensive and tachypneic in the ED, started on IV abx and admitted. During admission developed hypoxia and had CTA and LE dopplers which were neg for DVT/PE and O2 saturations improved during course. Was also worked up for troponin elevation during admission which was thought to be due to demand ischemia given stable EKGs throughout admission. Was d/c'd home on 3 more days of keflex and told to restart home medications. Has been doing very well since being back home other than feeling weak. No fevers, chills, abdominal pains, confusion, further falls. No new concerns today.   Transition of Care Hospital Follow up.   Hospital/Facility: [facility] D/C Physician: Dr. Reesa Chew D/C Date: 03/10/2019  Records Requested: available Records Received: immediately Records Reviewed: 03/19/2019  Diagnoses on Discharge: UTI with sepsis  Date of interactive Contact within 48 hours of discharge: 03/12/2019 Contact was through: phone  Date of 7 day or 14 day face-to-face visit:    within 7 days  Outpatient Encounter Medications as of 03/19/2019  Medication Sig  . ACCU-CHEK AVIVA PLUS test strip Check 1 time a day  . allopurinol (ZYLOPRIM) 300 MG tablet Take 1 tablet (300 mg total) by mouth daily.  Marland Kitchen amLODipine (NORVASC) 10 MG tablet Take 1 tablet (10 mg total) by mouth daily.  . Blood Glucose Monitoring Suppl (ACCU-CHEK AVIVA PLUS) w/Device KIT Test 1 time daily  . glipiZIDE (GLUCOTROL) 5 MG tablet Take 1 tablet  (5 mg total) by mouth daily before breakfast.  . losartan (COZAAR) 100 MG tablet Take 1 tablet (100 mg total) by mouth daily.  . metFORMIN (GLUCOPHAGE) 500 MG tablet Take 2 tablets (1,000 mg total) by mouth 2 (two) times daily with a meal.  . pantoprazole (PROTONIX) 40 MG tablet Take 1 tablet (40 mg total) by mouth daily.  . rosuvastatin (CRESTOR) 40 MG tablet Take 1 tablet (40 mg total) by mouth daily.  . [DISCONTINUED] cetirizine (ZYRTEC) 10 MG tablet Take 1 tablet (10 mg total) by mouth daily. (Patient not taking: Reported on 03/05/2019)  . [DISCONTINUED] ondansetron (ZOFRAN) 4 MG tablet Take 1 tablet (4 mg total) by mouth every 8 (eight) hours as needed for nausea or vomiting. (Patient not taking: Reported on 03/05/2019)   No facility-administered encounter medications on file as of 03/19/2019.     Diagnostic Tests Reviewed/Disposition: Imaging, labs  Consults: Nephrology  Discharge Instructions - complete 3 days of keflex, resume home medications  Disease/illness Education: given  Home Health/Community Services Discussions/Referrals:none  Establishment or re-establishment of referral orders for community resources: none  Discussion with other health care providers: none   Assessment and Support of treatment regimen adherence: excellent  Appointments Coordinated with: none  Education for self-management, independent living, and ADLs: given  Relevant past medical, surgical, family and social history reviewed and updated as indicated. Interim medical history since our last visit reviewed. Allergies and  medications reviewed and updated.  Review of Systems  Per HPI unless specifically indicated above     Objective:    BP 135/65   Pulse 62   Temp 97.8 F (36.6 C) (Oral)   Ht _0  (1.6 m)   Wt 162 lb (73.5 kg)   SpO2 96%   BMI 28.70 kg/m   Wt Readings from Last 3 Encounters:  03/19/19 162 lb (73.5 kg)  03/11/19 159 lb 13.3 oz (72.5 kg)  11/20/18 172 lb (78 kg)     Physical Exam Vitals signs and nursing note reviewed.  Constitutional:      Appearance: Normal appearance.  HENT:     Head: Atraumatic.  Eyes:     Extraocular Movements: Extraocular movements intact.     Conjunctiva/sclera: Conjunctivae normal.  Neck:     Musculoskeletal: Normal range of motion and neck supple.  Cardiovascular:     Rate and Rhythm: Normal rate and regular rhythm.     Heart sounds: Normal heart sounds.  Pulmonary:     Effort: Pulmonary effort is normal.     Breath sounds: Normal breath sounds.  Abdominal:     General: Bowel sounds are normal.     Palpations: Abdomen is soft.     Tenderness: There is no abdominal tenderness. There is no right CVA tenderness, left CVA tenderness or guarding.  Musculoskeletal:     Comments: ROM at baseline  Skin:    General: Skin is warm and dry.  Neurological:     General: No focal deficit present.     Mental Status: He is oriented to person, place, and time.  Psychiatric:        Mood and Affect: Mood normal.        Thought Content: Thought content normal.        Judgment: Judgment normal.     Results for orders placed or performed during the hospital encounter of 03/05/19  Blood Culture (routine x 2)   Specimen: BLOOD  Result Value Ref Range   Specimen Description      BLOOD BLOOD RIGHT HAND Performed at [facility], [place] [address] [place], [place] [address]    Special Requests      BOTTLES DRAWN AEROBIC AND ANAEROBIC Blood Culture adequate volume Performed at [facility], [place] [address], [place], [place] [address]    Culture  Setup Time      GRAM NEGATIVE RODS IN BOTH AEROBIC AND ANAEROBIC BOTTLES CRITICAL VALUE NOTED.  VALUE IS CONSISTENT WITH PREVIOUSLY REPORTED AND CALLED VALUE. Performed at [facility], [place], [place] [address]    Culture (A)     ESCHERICHIA COLI SUSCEPTIBILITIES PERFORMED ON PREVIOUS CULTURE WITHIN THE LAST 5 DAYS. Performed at [facility], [place] [address], [place], [place] [address]    Report Status 03/08/2019 FINAL   Blood Culture (routine x 2)   Specimen: BLOOD LEFT FOREARM  Result Value Ref Range   Specimen Description BLOOD LEFT FOREARM    Special Requests      BOTTLES DRAWN AEROBIC AND ANAEROBIC Blood Culture adequate volume   Culture  Setup Time      GRAM NEGATIVE RODS IN BOTH AEROBIC AND ANAEROBIC BOTTLES CRITICAL RESULT CALLED TO, READ BACK BY AND VERIFIED WITHHart Robinsons [facility] 4008 03/06/2019 HNM Performed at [facility], [place] [address] [place], [place] [address]    Culture ESCHERICHIA COLI (A)    Report Status 03/08/2019 FINAL  Organism ID, Bacteria ESCHERICHIA COLI       Susceptibility   Escherichia coli - MIC*    AMPICILLIN >=32 RESISTANT Resistant     CEFAZOLIN <=4 SENSITIVE Sensitive     CEFEPIME <=1 SENSITIVE Sensitive     CEFTAZIDIME <=1 SENSITIVE Sensitive     CEFTRIAXONE <=1 SENSITIVE Sensitive     CIPROFLOXACIN >=4 RESISTANT Resistant     GENTAMICIN <=1 SENSITIVE Sensitive     IMIPENEM <=0.25 SENSITIVE Sensitive     TRIMETH/SULFA <=20 SENSITIVE Sensitive     AMPICILLIN/SULBACTAM 16 INTERMEDIATE Intermediate     PIP/TAZO <=4 SENSITIVE Sensitive     Extended ESBL NEGATIVE Sensitive     * ESCHERICHIA COLI  Urine culture   Specimen: In/Out Cath Urine  Result Value Ref Range   Specimen Description      IN/OUT CATH URINE Performed at [facility], [address] [place], [place] [address]    Special Requests      NONE Performed at [facility], [address] [place], [place] [address]    Culture 40,000 COLONIES/mL ESCHERICHIA COLI (A)    Report Status 03/07/2019 FINAL    Organism ID, Bacteria ESCHERICHIA COLI (A)       Susceptibility   Escherichia coli - MIC*    AMPICILLIN >=32 RESISTANT Resistant     CEFAZOLIN <=4 SENSITIVE Sensitive     CEFTRIAXONE <=1 SENSITIVE Sensitive     CIPROFLOXACIN >=4 RESISTANT Resistant     GENTAMICIN <=1 SENSITIVE  Sensitive     IMIPENEM <=0.25 SENSITIVE Sensitive     NITROFURANTOIN <=16 SENSITIVE Sensitive     TRIMETH/SULFA <=20 SENSITIVE Sensitive     AMPICILLIN/SULBACTAM 16 INTERMEDIATE Intermediate     PIP/TAZO <=4 SENSITIVE Sensitive     Extended ESBL NEGATIVE Sensitive     * 40,000 COLONIES/mL ESCHERICHIA COLI  SARS Coronavirus 2 by RT PCR (hospital order, performed in [place] hospital lab) Nasopharyngeal Nasopharyngeal Swab   Specimen: Nasopharyngeal Swab  Result Value Ref Range   SARS Coronavirus 2 NEGATIVE NEGATIVE  MRSA PCR Screening   Specimen: Nasal Mucosa; Nasopharyngeal  Result Value Ref Range   MRSA by PCR NEGATIVE NEGATIVE  Blood Culture ID Panel (Reflexed)  Result Value Ref Range   Enterococcus species NOT DETECTED NOT DETECTED   Listeria monocytogenes NOT DETECTED NOT DETECTED   Staphylococcus species NOT DETECTED NOT DETECTED   Staphylococcus aureus (BCID) NOT DETECTED NOT DETECTED   Streptococcus species NOT DETECTED NOT DETECTED   Streptococcus agalactiae NOT DETECTED NOT DETECTED   Streptococcus pneumoniae NOT DETECTED NOT DETECTED   Streptococcus pyogenes NOT DETECTED NOT DETECTED   Acinetobacter baumannii NOT DETECTED NOT DETECTED   Enterobacteriaceae species DETECTED (A) NOT DETECTED   Enterobacter cloacae complex NOT DETECTED NOT DETECTED   Escherichia coli DETECTED (A) NOT DETECTED   Klebsiella oxytoca NOT DETECTED NOT DETECTED   Klebsiella pneumoniae NOT DETECTED NOT DETECTED   Proteus species NOT DETECTED NOT DETECTED   Serratia marcescens NOT DETECTED NOT DETECTED   Carbapenem resistance NOT DETECTED NOT DETECTED   Haemophilus influenzae NOT DETECTED NOT DETECTED   Neisseria meningitidis NOT DETECTED NOT DETECTED   Pseudomonas aeruginosa NOT DETECTED NOT DETECTED   Candida albicans NOT DETECTED NOT DETECTED   Candida glabrata NOT DETECTED NOT DETECTED   Candida krusei NOT DETECTED NOT DETECTED   Candida parapsilosis NOT DETECTED NOT DETECTED   Candida  tropicalis NOT DETECTED NOT DETECTED  Culture, blood (single) w Reflex to ID Panel   Specimen: BLOOD  Result Value Ref Range   Specimen Description BLOOD RIGHT HAND    Special Requests      BOTTLES DRAWN AEROBIC AND ANAEROBIC Blood Culture results may not be optimal due to an excessive volume of blood received in culture bottles   Culture      NO GROWTH 5 DAYS Performed at [facility], [address] [place], [place] [address]    Report Status 03/14/2019 FINAL   Lactic acid, plasma  Result Value Ref Range   Lactic Acid, Venous 3.6 (HH) 0.5 - 1.9 mmol/L  Lactic acid, plasma  Result Value Ref Range   Lactic Acid, Venous 1.4 0.5 - 1.9 mmol/L  Comprehensive metabolic panel  Result Value Ref Range   Sodium 130 (L) 135 - 145 mmol/L   Potassium 3.5 3.5 - 5.1 mmol/L   Chloride 98 98 - 111 mmol/L   CO2 15 (L) 22 - 32 mmol/L   Glucose, Bld 337 (H) 70 - 99 mg/dL   BUN 21 8 - 23 mg/dL   Creatinine, Ser 1.19 0.61 - 1.24 mg/dL   Calcium 9.8 8.9 - 10.3 mg/dL   Total Protein 7.7 6.5 - 8.1 g/dL   Albumin 4.1 3.5 - 5.0 g/dL   AST 31 15 - 41 U/L   ALT 24 0 - 44 U/L   Alkaline Phosphatase 104 38 - 126 U/L   Total Bilirubin 3.0 (H) 0.3 - 1.2 mg/dL   GFR calc non Af Amer 60 (L) >60 mL/min   GFR calc Af Amer >60 >60 mL/min   Anion gap 17 (H) 5 - 15  CBC WITH DIFFERENTIAL  Result Value Ref Range   WBC 13.6 (H) 4.0 - 10.5 K/uL   RBC 4.88 4.22 - 5.81 MIL/uL   Hemoglobin 15.5 13.0 - 17.0 g/dL   HCT 44.6 39.0 - 52.0 %   MCV 91.4 80.0 - 100.0 fL   MCH 31.8 26.0 - 34.0 pg   MCHC 34.8 30.0 - 36.0 g/dL   RDW 13.2 11.5 - 15.5 %   Platelets 173 150 - 400 K/uL   nRBC 0.0 0.0 - 0.2 %   Neutrophils Relative % 91 %   Neutro Abs 12.3 (H) 1.7 - 7.7 K/uL   Lymphocytes Relative 5 %   Lymphs Abs 0.7 0.7 - 4.0 K/uL   Monocytes Relative 3 %   Monocytes Absolute 0.3 0.1 - 1.0 K/uL   Eosinophils Relative 0 %   Eosinophils Absolute 0.0 0.0 - 0.5 K/uL   Basophils Relative 0 %   Basophils  Absolute 0.0 0.0 - 0.1 K/uL   Immature Granulocytes 1 %   Abs Immature Granulocytes 0.15 (H) 0.00 - 0.07 K/uL  APTT  Result Value Ref Range   aPTT 31 24 - 36 seconds  Protime-INR  Result Value Ref Range   Prothrombin Time 14.9 11.4 - 15.2 seconds   INR 1.2 0.8 - 1.2  Urinalysis, Routine w reflex microscopic  Result Value Ref Range   Color, Urine AMBER (A) YELLOW   APPearance CLOUDY (A) CLEAR   Specific Gravity, Urine 1.026 1.005 - 1.030   pH 5.0 5.0 - 8.0   Glucose, UA >=500 (A) NEGATIVE mg/dL   Hgb urine dipstick LARGE (A) NEGATIVE   Bilirubin Urine NEGATIVE NEGATIVE   Ketones, ur 20 (A) NEGATIVE mg/dL   Protein, ur >=300 (A) NEGATIVE mg/dL   Nitrite NEGATIVE NEGATIVE   Leukocytes,Ua TRACE (A) NEGATIVE   RBC / HPF >50 (H) 0 - 5 RBC/hpf   WBC,  UA >50 (H) 0 - 5 WBC/hpf   Bacteria, UA NONE SEEN NONE SEEN   Squamous Epithelial / LPF NONE SEEN 0 - 5   WBC Clumps PRESENT    Mucus PRESENT   Procalcitonin  Result Value Ref Range   Procalcitonin 2.98 ng/mL  Beta-hydroxybutyric acid  Result Value Ref Range   Beta-Hydroxybutyric Acid 2.53 (H) 0.05 - 0.27 mmol/L  Glucose, capillary  Result Value Ref Range   Glucose-Capillary 274 (H) 70 - 99 mg/dL  Blood gas, arterial  Result Value Ref Range   FIO2 0.32    Delivery systems NASAL CANNULA    pH, Arterial 7.43 7.350 - 7.450   pCO2 arterial 32 32.0 - 48.0 mmHg   pO2, Arterial 102 83.0 - 108.0 mmHg   Bicarbonate 21.2 20.0 - 28.0 mmol/L   Acid-base deficit 2.3 (H) 0.0 - 2.0 mmol/L   O2 Saturation 98.0 %   Patient temperature 37.0    Collection site RIGHT RADIAL    Sample type ARTERIAL DRAW    Allens test (pass/fail) PASS PASS  CK  Result Value Ref Range   Total CK 1,141 (H) 49 - 397 U/L  Basic metabolic panel  Result Value Ref Range   Sodium 131 (L) 135 - 145 mmol/L   Potassium 3.3 (L) 3.5 - 5.1 mmol/L   Chloride 101 98 - 111 mmol/L   CO2 20 (L) 22 - 32 mmol/L   Glucose, Bld 291 (H) 70 - 99 mg/dL   BUN 22 8 - 23 mg/dL    Creatinine, Ser 1.22 0.61 - 1.24 mg/dL   Calcium 9.0 8.9 - 10.3 mg/dL   GFR calc non Af Amer 58 (L) >60 mL/min   GFR calc Af Amer >60 >60 mL/min   Anion gap 10 5 - 15  Osmolality, urine  Result Value Ref Range   Osmolality, Ur 323 300 - 900 mOsm/kg  Sodium, urine, random  Result Value Ref Range   Sodium, Ur 21 mmol/L  Creatinine, urine, random  Result Value Ref Range   Creatinine, Urine 82 mg/dL  Ammonia  Result Value Ref Range   Ammonia 21 9 - 35 umol/L  Glucose, capillary  Result Value Ref Range   Glucose-Capillary 303 (H) 70 - 99 mg/dL  Magnesium  Result Value Ref Range   Magnesium 2.0 1.7 - 2.4 mg/dL  Phosphorus  Result Value Ref Range   Phosphorus 3.1 2.5 - 4.6 mg/dL  TSH  Result Value Ref Range   TSH 1.393 0.350 - 4.500 uIU/mL  Comprehensive metabolic panel  Result Value Ref Range   Sodium 135 135 - 145 mmol/L   Potassium 3.5 3.5 - 5.1 mmol/L   Chloride 105 98 - 111 mmol/L   CO2 21 (L) 22 - 32 mmol/L   Glucose, Bld 168 (H) 70 - 99 mg/dL   BUN 25 (H) 8 - 23 mg/dL   Creatinine, Ser 1.24 0.61 - 1.24 mg/dL   Calcium 9.0 8.9 - 10.3 mg/dL   Total Protein 6.1 (L) 6.5 - 8.1 g/dL   Albumin 3.0 (L) 3.5 - 5.0 g/dL   AST 31 15 - 41 U/L   ALT 22 0 - 44 U/L   Alkaline Phosphatase 54 38 - 126 U/L   Total Bilirubin 1.5 (H) 0.3 - 1.2 mg/dL   GFR calc non Af Amer 57 (L) >60 mL/min   GFR calc Af Amer >60 >60 mL/min   Anion gap 9 5 - 15  CBC  Result Value Ref Range  WBC 17.7 (H) 4.0 - 10.5 K/uL   RBC 4.21 (L) 4.22 - 5.81 MIL/uL   Hemoglobin 13.2 13.0 - 17.0 g/dL   HCT 38.7 (L) 39.0 - 52.0 %   MCV 91.9 80.0 - 100.0 fL   MCH 31.4 26.0 - 34.0 pg   MCHC 34.1 30.0 - 36.0 g/dL   RDW 13.4 11.5 - 15.5 %   Platelets 154 150 - 400 K/uL   nRBC 0.0 0.0 - 0.2 %  CK  Result Value Ref Range   Total CK 725 (H) 49 - 397 U/L  Hemoglobin A1c  Result Value Ref Range   Hgb A1c MFr Bld 8.0 (H) 4.8 - 5.6 %   Mean Plasma Glucose 182.9 mg/dL  Glucose, capillary  Result Value Ref Range    Glucose-Capillary 169 (H) 70 - 99 mg/dL  Glucose, capillary  Result Value Ref Range   Glucose-Capillary 121 (H) 70 - 99 mg/dL  Glucose, capillary  Result Value Ref Range   Glucose-Capillary 140 (H) 70 - 99 mg/dL  CBC  Result Value Ref Range   WBC 11.3 (H) 4.0 - 10.5 K/uL   RBC 3.97 (L) 4.22 - 5.81 MIL/uL   Hemoglobin 12.4 (L) 13.0 - 17.0 g/dL   HCT 35.4 (L) 39.0 - 52.0 %   MCV 89.2 80.0 - 100.0 fL   MCH 31.2 26.0 - 34.0 pg   MCHC 35.0 30.0 - 36.0 g/dL   RDW 13.6 11.5 - 15.5 %   Platelets 137 (L) 150 - 400 K/uL   nRBC 0.0 0.0 - 0.2 %  Comprehensive metabolic panel  Result Value Ref Range   Sodium 133 (L) 135 - 145 mmol/L   Potassium 3.2 (L) 3.5 - 5.1 mmol/L   Chloride 103 98 - 111 mmol/L   CO2 18 (L) 22 - 32 mmol/L   Glucose, Bld 139 (H) 70 - 99 mg/dL   BUN 25 (H) 8 - 23 mg/dL   Creatinine, Ser 1.26 (H) 0.61 - 1.24 mg/dL   Calcium 8.7 (L) 8.9 - 10.3 mg/dL   Total Protein 5.9 (L) 6.5 - 8.1 g/dL   Albumin 2.7 (L) 3.5 - 5.0 g/dL   AST 25 15 - 41 U/L   ALT 23 0 - 44 U/L   Alkaline Phosphatase 59 38 - 126 U/L   Total Bilirubin 1.7 (H) 0.3 - 1.2 mg/dL   GFR calc non Af Amer 56 (L) >60 mL/min   GFR calc Af Amer >60 >60 mL/min   Anion gap 12 5 - 15  CK  Result Value Ref Range   Total CK 226 49 - 397 U/L  Glucose, capillary  Result Value Ref Range   Glucose-Capillary 161 (H) 70 - 99 mg/dL  Glucose, capillary  Result Value Ref Range   Glucose-Capillary 116 (H) 70 - 99 mg/dL  Glucose, capillary  Result Value Ref Range   Glucose-Capillary 127 (H) 70 - 99 mg/dL  Glucose, capillary  Result Value Ref Range   Glucose-Capillary 120 (H) 70 - 99 mg/dL  Magnesium  Result Value Ref Range   Magnesium 2.0 1.7 - 2.4 mg/dL  Fibrin derivatives D-Dimer ([facility] only)  Result Value Ref Range   Fibrin derivatives D-dimer ([facility]) 2,520.09 (H) 0.00 - 499.00 ng/mL (FEU)  Glucose, capillary  Result Value Ref Range   Glucose-Capillary 133 (H) 70 - 99 mg/dL  Glucose, capillary  Result Value  Ref Range   Glucose-Capillary 124 (H) 70 - 99 mg/dL  Comprehensive metabolic panel  Result Value Ref Range  Sodium 131 (L) 135 - 145 mmol/L   Potassium 3.0 (L) 3.5 - 5.1 mmol/L   Chloride 101 98 - 111 mmol/L   CO2 17 (L) 22 - 32 mmol/L   Glucose, Bld 147 (H) 70 - 99 mg/dL   BUN 21 8 - 23 mg/dL   Creatinine, Ser 1.16 0.61 - 1.24 mg/dL   Calcium 8.6 (L) 8.9 - 10.3 mg/dL   Total Protein 5.6 (L) 6.5 - 8.1 g/dL   Albumin 2.6 (L) 3.5 - 5.0 g/dL   AST 23 15 - 41 U/L   ALT 22 0 - 44 U/L   Alkaline Phosphatase 56 38 - 126 U/L   Total Bilirubin 1.4 (H) 0.3 - 1.2 mg/dL   GFR calc non Af Amer >60 >60 mL/min   GFR calc Af Amer >60 >60 mL/min   Anion gap 13 5 - 15  CBC  Result Value Ref Range   WBC 9.7 4.0 - 10.5 K/uL   RBC 3.80 (L) 4.22 - 5.81 MIL/uL   Hemoglobin 12.1 (L) 13.0 - 17.0 g/dL   HCT 33.7 (L) 39.0 - 52.0 %   MCV 88.7 80.0 - 100.0 fL   MCH 31.8 26.0 - 34.0 pg   MCHC 35.9 30.0 - 36.0 g/dL   RDW 13.6 11.5 - 15.5 %   Platelets 140 (L) 150 - 400 K/uL   nRBC 0.0 0.0 - 0.2 %  Magnesium  Result Value Ref Range   Magnesium 2.0 1.7 - 2.4 mg/dL  Glucose, capillary  Result Value Ref Range   Glucose-Capillary 142 (H) 70 - 99 mg/dL  Blood gas, arterial  Result Value Ref Range   FIO2 36.00    Delivery systems NASAL CANNULA    pH, Arterial 7.41 7.350 - 7.450   pCO2 arterial 34 32.0 - 48.0 mmHg   pO2, Arterial 73 (L) 83.0 - 108.0 mmHg   Bicarbonate 21.6 20.0 - 28.0 mmol/L   Acid-base deficit 2.4 (H) 0.0 - 2.0 mmol/L   O2 Saturation 94.6 %   Patient temperature 37.0    Collection site RIGHT RADIAL    Sample type ARTHROGRAPHIS SPECIES    Allens test (pass/fail) POSITIVE (A) PASS  Lactic acid, plasma  Result Value Ref Range   Lactic Acid, Venous 0.8 0.5 - 1.9 mmol/L  Na and K (sodium & potassium), rand urine  Result Value Ref Range   Sodium, Ur 52 mmol/L   Potassium Urine 16 mmol/L  Chloride, urine, random  Result Value Ref Range   Chloride Urine 51 mmol/L  Phosphorus   Result Value Ref Range   Phosphorus 1.8 (L) 2.5 - 4.6 mg/dL  Urinalysis, Complete w Microscopic  Result Value Ref Range   Color, Urine YELLOW (A) YELLOW   APPearance HAZY (A) CLEAR   Specific Gravity, Urine 1.011 1.005 - 1.030   pH 6.0 5.0 - 8.0   Glucose, UA 50 (A) NEGATIVE mg/dL   Hgb urine dipstick MODERATE (A) NEGATIVE   Bilirubin Urine NEGATIVE NEGATIVE   Ketones, ur 20 (A) NEGATIVE mg/dL   Protein, ur 100 (A) NEGATIVE mg/dL   Nitrite NEGATIVE NEGATIVE   Leukocytes,Ua TRACE (A) NEGATIVE   RBC / HPF 0-5 0 - 5 RBC/hpf   WBC, UA 21-50 0 - 5 WBC/hpf   Bacteria, UA RARE (A) NONE SEEN   Squamous Epithelial / LPF 0-5 0 - 5   Mucus PRESENT   Glucose, capillary  Result Value Ref Range   Glucose-Capillary 154 (H) 70 - 99 mg/dL  Renal function  panel  Result Value Ref Range   Sodium 137 135 - 145 mmol/L   Potassium 3.7 3.5 - 5.1 mmol/L   Chloride 101 98 - 111 mmol/L   CO2 22 22 - 32 mmol/L   Glucose, Bld 158 (H) 70 - 99 mg/dL   BUN 17 8 - 23 mg/dL   Creatinine, Ser 0.92 0.61 - 1.24 mg/dL   Calcium 8.5 (L) 8.9 - 10.3 mg/dL   Phosphorus 3.0 2.5 - 4.6 mg/dL   Albumin 2.6 (L) 3.5 - 5.0 g/dL   GFR calc non Af Amer >60 >60 mL/min   GFR calc Af Amer >60 >60 mL/min   Anion gap 14 5 - 15  CBC  Result Value Ref Range   WBC 7.7 4.0 - 10.5 K/uL   RBC 3.90 (L) 4.22 - 5.81 MIL/uL   Hemoglobin 12.1 (L) 13.0 - 17.0 g/dL   HCT 35.2 (L) 39.0 - 52.0 %   MCV 90.3 80.0 - 100.0 fL   MCH 31.0 26.0 - 34.0 pg   MCHC 34.4 30.0 - 36.0 g/dL   RDW 13.6 11.5 - 15.5 %   Platelets 130 (L) 150 - 400 K/uL   nRBC 0.0 0.0 - 0.2 %  Magnesium  Result Value Ref Range   Magnesium 1.9 1.7 - 2.4 mg/dL  Glucose, capillary  Result Value Ref Range   Glucose-Capillary 181 (H) 70 - 99 mg/dL  Cortisol-am, blood  Result Value Ref Range   Cortisol - AM 17.3 6.7 - 22.6 ug/dL  Renal function panel  Result Value Ref Range   Sodium 137 135 - 145 mmol/L   Potassium 3.2 (L) 3.5 - 5.1 mmol/L   Chloride 101 98 - 111  mmol/L   CO2 24 22 - 32 mmol/L   Glucose, Bld 272 (H) 70 - 99 mg/dL   BUN 20 8 - 23 mg/dL   Creatinine, Ser 1.06 0.61 - 1.24 mg/dL   Calcium 8.5 (L) 8.9 - 10.3 mg/dL   Phosphorus 1.9 (L) 2.5 - 4.6 mg/dL   Albumin 2.6 (L) 3.5 - 5.0 g/dL   GFR calc non Af Amer >60 >60 mL/min   GFR calc Af Amer >60 >60 mL/min   Anion gap 12 5 - 15  CBC  Result Value Ref Range   WBC 6.4 4.0 - 10.5 K/uL   RBC 4.14 (L) 4.22 - 5.81 MIL/uL   Hemoglobin 13.0 13.0 - 17.0 g/dL   HCT 37.6 (L) 39.0 - 52.0 %   MCV 90.8 80.0 - 100.0 fL   MCH 31.4 26.0 - 34.0 pg   MCHC 34.6 30.0 - 36.0 g/dL   RDW 13.3 11.5 - 15.5 %   Platelets 160 150 - 400 K/uL   nRBC 0.0 0.0 - 0.2 %  Magnesium  Result Value Ref Range   Magnesium 2.2 1.7 - 2.4 mg/dL  Glucose, capillary  Result Value Ref Range   Glucose-Capillary 171 (H) 70 - 99 mg/dL   Comment 1 Notify RN    Comment 2 Document in Chart   Glucose, capillary  Result Value Ref Range   Glucose-Capillary 163 (H) 70 - 99 mg/dL  Glucose, capillary  Result Value Ref Range   Glucose-Capillary 257 (H) 70 - 99 mg/dL  Glucose, capillary  Result Value Ref Range   Glucose-Capillary 211 (H) 70 - 99 mg/dL  Glucose, capillary  Result Value Ref Range   Glucose-Capillary 165 (H) 70 - 99 mg/dL  Glucose, capillary  Result Value Ref Range   Glucose-Capillary 227 (H)  70 - 99 mg/dL  Glucose, capillary  Result Value Ref Range   Glucose-Capillary 179 (H) 70 - 99 mg/dL  Glucose, capillary  Result Value Ref Range   Glucose-Capillary 218 (H) 70 - 99 mg/dL  Glucose, capillary  Result Value Ref Range   Glucose-Capillary 148 (H) 70 - 99 mg/dL  Glucose, capillary  Result Value Ref Range   Glucose-Capillary 180 (H) 70 - 99 mg/dL   Comment 1 Notify RN    Comment 2 Document in Chart   Glucose, capillary  Result Value Ref Range   Glucose-Capillary 148 (H) 70 - 99 mg/dL  Glucose, capillary  Result Value Ref Range   Glucose-Capillary 232 (H) 70 - 99 mg/dL  Glucose, capillary  Result  Value Ref Range   Glucose-Capillary 137 (H) 70 - 99 mg/dL  Renal function panel  Result Value Ref Range   Sodium 136 135 - 145 mmol/L   Potassium 3.7 3.5 - 5.1 mmol/L   Chloride 102 98 - 111 mmol/L   CO2 23 22 - 32 mmol/L   Glucose, Bld 211 (H) 70 - 99 mg/dL   BUN 19 8 - 23 mg/dL   Creatinine, Ser 0.93 0.61 - 1.24 mg/dL   Calcium 9.0 8.9 - 10.3 mg/dL   Phosphorus 2.7 2.5 - 4.6 mg/dL   Albumin 2.8 (L) 3.5 - 5.0 g/dL   GFR calc non Af Amer >60 >60 mL/min   GFR calc Af Amer >60 >60 mL/min   Anion gap 11 5 - 15  Magnesium  Result Value Ref Range   Magnesium 2.4 1.7 - 2.4 mg/dL  Glucose, capillary  Result Value Ref Range   Glucose-Capillary 221 (H) 70 - 99 mg/dL  Glucose, capillary  Result Value Ref Range   Glucose-Capillary 262 (H) 70 - 99 mg/dL  Glucose, capillary  Result Value Ref Range   Glucose-Capillary 232 (H) 70 - 99 mg/dL  Glucose, capillary  Result Value Ref Range   Glucose-Capillary 216 (H) 70 - 99 mg/dL  POC SARS Coronavirus 2 Ag  Result Value Ref Range   SARS Coronavirus 2 Ag NEGATIVE NEGATIVE  ECHOCARDIOGRAM COMPLETE  Result Value Ref Range   Weight 2,511.48 oz   Height 66 in   BP 143/93 mmHg  Troponin I (High Sensitivity)  Result Value Ref Range   Troponin I (High Sensitivity) 57 (H) <18 ng/L  Troponin I (High Sensitivity)  Result Value Ref Range   Troponin I (High Sensitivity) 85 (H) <18 ng/L  Troponin I (High Sensitivity)  Result Value Ref Range   Troponin I (High Sensitivity) 76 (H) <18 ng/L  Troponin I (High Sensitivity)  Result Value Ref Range   Troponin I (High Sensitivity) 74 (H) <18 ng/L  Troponin I (High Sensitivity)  Result Value Ref Range   Troponin I (High Sensitivity) 38 (H) <18 ng/L  Troponin I (High Sensitivity)  Result Value Ref Range   Troponin I (High Sensitivity) 20 (H) <18 ng/L      Assessment & Plan:   Problem List Items Addressed This Visit      Genitourinary   Acute lower UTI - Primary   Relevant Orders    Comprehensive metabolic panel    Other Visit Diagnoses    History of sepsis       Abnormal CBC       Relevant Orders   CBC with Differential/Platelet out       Follow up plan: Return in about 4 weeks (around 04/16/2019) for 6 month f/u.

## 2019-03-20 LAB — COMPREHENSIVE METABOLIC PANEL
ALT: 19 IU/L (ref 0–44)
AST: 17 IU/L (ref 0–40)
Albumin/Globulin Ratio: 1.4 (ref 1.2–2.2)
Albumin: 3.8 g/dL (ref 3.7–4.7)
Alkaline Phosphatase: 82 IU/L (ref 39–117)
BUN/Creatinine Ratio: 12 (ref 10–24)
BUN: 11 mg/dL (ref 8–27)
Bilirubin Total: 0.7 mg/dL (ref 0.0–1.2)
CO2: 18 mmol/L — ABNORMAL LOW (ref 20–29)
Calcium: 9.8 mg/dL (ref 8.6–10.2)
Chloride: 102 mmol/L (ref 96–106)
Creatinine, Ser: 0.93 mg/dL (ref 0.76–1.27)
GFR calc Af Amer: 93 mL/min/{1.73_m2} (ref >59)
GFR calc non Af Amer: 80 mL/min/{1.73_m2} (ref >59)
Globulin, Total: 2.7 g/dL (ref 1.5–4.5)
Glucose: 173 mg/dL — ABNORMAL HIGH (ref 65–99)
Potassium: 4.3 mmol/L (ref 3.5–5.2)
Sodium: 137 mmol/L (ref 134–144)
Total Protein: 6.5 g/dL (ref 6.0–8.5)

## 2019-03-20 LAB — CBC WITH DIFFERENTIAL/PLATELET
Basophils Absolute: 0.1 10*3/uL (ref 0.0–0.2)
Basos: 1 %
EOS (ABSOLUTE): 0.1 10*3/uL (ref 0.0–0.4)
Eos: 1 %
Hematocrit: 41.1 % (ref 37.5–51.0)
Hemoglobin: 14.2 g/dL (ref 13.0–17.7)
Immature Grans (Abs): 0.1 10*3/uL (ref 0.0–0.1)
Immature Granulocytes: 1 %
Lymphocytes Absolute: 1.6 10*3/uL (ref 0.7–3.1)
Lymphs: 16 %
MCH: 31.7 pg (ref 26.6–33.0)
MCHC: 34.5 g/dL (ref 31.5–35.7)
MCV: 92 fL (ref 79–97)
Monocytes Absolute: 0.5 10*3/uL (ref 0.1–0.9)
Monocytes: 5 %
Neutrophils Absolute: 8 10*3/uL — ABNORMAL HIGH (ref 1.4–7.0)
Neutrophils: 76 %
Platelets: 242 10*3/uL (ref 150–450)
RBC: 4.48 x10E6/uL (ref 4.14–5.80)
RDW: 13.3 % (ref 11.6–15.4)
WBC: 10.4 10*3/uL (ref 3.4–10.8)

## 2019-04-21 ENCOUNTER — Ambulatory Visit: Payer: Medicare HMO | Admitting: Family Medicine

## 2019-04-21 ENCOUNTER — Encounter: Payer: Self-pay | Admitting: Family Medicine

## 2019-04-21 ENCOUNTER — Ambulatory Visit (INDEPENDENT_AMBULATORY_CARE_PROVIDER_SITE_OTHER): Payer: Medicare HMO | Admitting: Family Medicine

## 2019-04-21 ENCOUNTER — Other Ambulatory Visit: Payer: Self-pay

## 2019-04-21 VITALS — BP 123/69 | HR 67 | Temp 98.1°F | Wt 155.0 lb

## 2019-04-21 DIAGNOSIS — I152 Hypertension secondary to endocrine disorders: Secondary | ICD-10-CM

## 2019-04-21 DIAGNOSIS — M109 Gout, unspecified: Secondary | ICD-10-CM | POA: Diagnosis not present

## 2019-04-21 DIAGNOSIS — S78111D Complete traumatic amputation at level between right hip and knee, subsequent encounter: Secondary | ICD-10-CM | POA: Diagnosis not present

## 2019-04-21 DIAGNOSIS — I10 Essential (primary) hypertension: Secondary | ICD-10-CM

## 2019-04-21 DIAGNOSIS — I2510 Atherosclerotic heart disease of native coronary artery without angina pectoris: Secondary | ICD-10-CM

## 2019-04-21 DIAGNOSIS — I2583 Coronary atherosclerosis due to lipid rich plaque: Secondary | ICD-10-CM | POA: Diagnosis not present

## 2019-04-21 DIAGNOSIS — E7800 Pure hypercholesterolemia, unspecified: Secondary | ICD-10-CM

## 2019-04-21 DIAGNOSIS — E1159 Type 2 diabetes mellitus with other circulatory complications: Secondary | ICD-10-CM

## 2019-04-21 DIAGNOSIS — E1169 Type 2 diabetes mellitus with other specified complication: Secondary | ICD-10-CM

## 2019-04-21 MED ORDER — PANTOPRAZOLE SODIUM 40 MG PO TBEC: 40.0000 mg | DELAYED_RELEASE_TABLET | Freq: Two times a day (BID) | ORAL | 2 refills | Status: DC

## 2019-04-21 NOTE — Progress Notes (Signed)
BP 123/69   Pulse 67   Temp 98.1 F (36.7 C) (Oral)   Wt 155 lb (70.3 kg)   SpO2 93%   BMI 27.46 kg/m    Subjective:    Patient ID: Carl Bryan, male    DOB: June 24, 1945, 74 y.o.   MRN: [id number]  HPI: Carl Bryan is a 74 y.o. male  Chief Complaint  Patient presents with  . Diabetes  . Hypertension  . Hyperlipidemia  . Emesis    hiccup x 3 days   Patient presenting today for 6 month f/u chronic conditions. No new concerns today.   DM - Checking BSs, usually fasting sugars are in 90s-105. Taking his medicines faithfully without side effects. Trying to watch diet but notes he splurged quite a bit during the holidays. Does not exercise due to hx of leg amputation and mobility issues. No low blood sugar spells.   HTN - does not check home BPs regularly but WNL when checking. Denies CP, SOB, HAs, dizziness.   HLD - On crestor without side effects. No myalgias, claudication.   Hx of gout, no recent flares. Taking allopurinol faithfully.    Relevant past medical, surgical, family and social history reviewed and updated as indicated. Interim medical history since our last visit reviewed. Allergies and medications reviewed and updated.  Review of Systems  Per HPI unless specifically indicated above     Objective:    BP 123/69   Pulse 67   Temp 98.1 F (36.7 C) (Oral)   Wt 155 lb (70.3 kg)   SpO2 93%   BMI 27.46 kg/m   Wt Readings from Last 3 Encounters:  04/21/19 155 lb (70.3 kg)  03/19/19 162 lb (73.5 kg)  03/11/19 159 lb 13.3 oz (72.5 kg)    Physical Exam Vitals and nursing note reviewed.  Constitutional:      Appearance: Normal appearance.  HENT:     Head: Atraumatic.  Eyes:     Extraocular Movements: Extraocular movements intact.     Conjunctiva/sclera: Conjunctivae normal.  Cardiovascular:     Rate and Rhythm: Normal rate and regular rhythm.  Pulmonary:     Effort: Pulmonary effort is normal.     Breath sounds: Normal breath sounds.   Musculoskeletal:        General: Normal range of motion.     Cervical back: Normal range of motion and neck supple.     Comments: S/p right lower leg amputation  Skin:    General: Skin is warm and dry.  Neurological:     General: No focal deficit present.     Mental Status: He is oriented to person, place, and time.  Psychiatric:        Mood and Affect: Mood normal.        Thought Content: Thought content normal.        Judgment: Judgment normal.     Results for orders placed or performed in visit on 04/21/19  Comprehensive metabolic panel  Result Value Ref Range   Glucose 119 (H) 65 - 99 mg/dL   BUN 12 8 - 27 mg/dL   Creatinine, Ser 0.88 0.76 - 1.27 mg/dL   GFR calc non Af Amer 84 >59 mL/min/1.73   GFR calc Af Amer 97 >59 mL/min/1.73   BUN/Creatinine Ratio 14 10 - 24   Sodium 132 (L) 134 - 144 mmol/L   Potassium 3.8 3.5 - 5.2 mmol/L   Chloride 97 96 - 106 mmol/L   CO2 19 (  L) 20 - 29 mmol/L   Calcium 9.7 8.6 - 10.2 mg/dL   Total Protein 6.3 6.0 - 8.5 g/dL   Albumin 4.0 3.7 - 4.7 g/dL   Globulin, Total 2.3 1.5 - 4.5 g/dL   Albumin/Globulin Ratio 1.7 1.2 - 2.2   Bilirubin Total 0.7 0.0 - 1.2 mg/dL   Alkaline Phosphatase 57 39 - 117 IU/L   AST 17 0 - 40 IU/L   ALT 15 0 - 44 IU/L  Lipid Panel w/o Chol/HDL Ratio out  Result Value Ref Range   Cholesterol, Total 101 100 - 199 mg/dL   Triglycerides 270 0 - 149 mg/dL   HDL 25 (L) >62 mg/dL   VLDL Cholesterol Cal 24 5 - 40 mg/dL   LDL Chol Calc (NIH) 52 0 - 99 mg/dL  HgB B7S  Result Value Ref Range   Hgb A1c MFr Bld 7.3 (H) 4.8 - 5.6 %   Est. 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 Continue good lifestyle habits and present medications      Relevant Orders   HgB A1c (Completed)     Other   Pure hypercholesterolemia    Recheck lipids, adjust as needed. 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 Continue current regimen          Follow up plan: Return in about 3 months (around 07/20/2019) for DM f/u.

## 2019-04-22 LAB — COMPREHENSIVE METABOLIC PANEL
ALT: 15 IU/L (ref 0–44)
AST: 17 IU/L (ref 0–40)
Albumin/Globulin Ratio: 1.7 (ref 1.2–2.2)
Albumin: 4 g/dL (ref 3.7–4.7)
Alkaline Phosphatase: 57 IU/L (ref 39–117)
BUN/Creatinine Ratio: 14 (ref 10–24)
BUN: 12 mg/dL (ref 8–27)
Bilirubin Total: 0.7 mg/dL (ref 0.0–1.2)
CO2: 19 mmol/L — ABNORMAL LOW (ref 20–29)
Calcium: 9.7 mg/dL (ref 8.6–10.2)
Chloride: 97 mmol/L (ref 96–106)
Creatinine, Ser: 0.88 mg/dL (ref 0.76–1.27)
GFR calc Af Amer: 97 mL/min/{1.73_m2} (ref >59)
GFR calc non Af Amer: 84 mL/min/{1.73_m2} (ref >59)
Globulin, Total: 2.3 g/dL (ref 1.5–4.5)
Glucose: 119 mg/dL — ABNORMAL HIGH (ref 65–99)
Potassium: 3.8 mmol/L (ref 3.5–5.2)
Sodium: 132 mmol/L — ABNORMAL LOW (ref 134–144)
Total Protein: 6.3 g/dL (ref 6.0–8.5)

## 2019-04-22 LAB — HEMOGLOBIN A1C
Est. average glucose Bld gHb Est-mCnc: 163 mg/dL
Hgb A1c MFr Bld: 7.3 % — ABNORMAL HIGH (ref 4.8–5.6)

## 2019-04-22 LAB — LIPID PANEL W/O CHOL/HDL RATIO
Cholesterol, Total: 101 mg/dL (ref 100–199)
HDL: 25 mg/dL — ABNORMAL LOW (ref >39)
LDL Chol Calc (NIH): 52 mg/dL (ref 0–99)
Triglycerides: 135 mg/dL (ref 0–149)
VLDL Cholesterol Cal: 24 mg/dL (ref 5–40)

## 2019-04-25 NOTE — Assessment & Plan Note (Signed)
Recheck labs, continue current regimen 

## 2019-04-25 NOTE — Assessment & Plan Note (Signed)
Recheck A1C, adjust as needed. Continue good lifestyle habits and present medications

## 2019-04-25 NOTE — Assessment & Plan Note (Signed)
Stable

## 2019-04-25 NOTE — Assessment & Plan Note (Signed)
Recheck lipids, adjust as needed. Continue present medications and good diet habits

## 2019-04-25 NOTE — Assessment & Plan Note (Signed)
BPs stable and WNL,  Continue current regimen

## 2019-04-25 NOTE — Assessment & Plan Note (Signed)
Stable without recent flares. Continue current regimen 

## 2019-05-15 ENCOUNTER — Telehealth: Payer: Self-pay | Admitting: Family Medicine

## 2019-05-15 NOTE — Chronic Care Management (AMB) (Signed)
  Chronic Care Management   Note  05/15/2019 Name: Carl Bryan MRN: [id number] DOB: August 08, 1945  Carl Bryan Klosowski is a 74 y.o. year old male who is a primary care patient of Volney American, [place]. I reached out to [employer] by phone today in response to a referral sent by [name]'s health plan.     [name] was given information about Chronic Care Management services today including:  1. CCM service includes personalized support from designated clinical staff supervised by his physician, including individualized plan of care and coordination with other care providers 2. 24/7 contact phone numbers for assistance for urgent and routine care needs. 3. Service will only be billed when office clinical staff spend 20 minutes or more in a month to coordinate care. 4. Only one practitioner may furnish and bill the service in a calendar month. 5. The patient may stop CCM services at any time (effective at the end of the month) by phone call to the office staff. 6. The patient will be responsible for cost sharing (co-pay) of up to 20% of the service fee (after annual deductible is met).  Patient agreed to services and verbal consent obtained.   Follow up plan: Telephone appointment with care management team member scheduled for:07/02/2019  Noreene Larsson, [address], [place], [place] [address] Direct Dial: [phone] [email or web address] Website: [email or web address]

## 2019-06-03 DIAGNOSIS — M1712 Unilateral primary osteoarthritis, left knee: Secondary | ICD-10-CM | POA: Diagnosis not present

## 2019-06-10 ENCOUNTER — Ambulatory Visit: Payer: Medicare HMO | Attending: Internal Medicine

## 2019-07-02 ENCOUNTER — Ambulatory Visit (INDEPENDENT_AMBULATORY_CARE_PROVIDER_SITE_OTHER): Payer: Medicare HMO | Admitting: General Practice

## 2019-07-02 ENCOUNTER — Telehealth: Payer: Medicare HMO | Admitting: General Practice

## 2019-07-02 DIAGNOSIS — I2510 Atherosclerotic heart disease of native coronary artery without angina pectoris: Secondary | ICD-10-CM | POA: Diagnosis not present

## 2019-07-02 DIAGNOSIS — I2583 Coronary atherosclerosis due to lipid rich plaque: Secondary | ICD-10-CM | POA: Diagnosis not present

## 2019-07-02 DIAGNOSIS — E7800 Pure hypercholesterolemia, unspecified: Secondary | ICD-10-CM

## 2019-07-02 DIAGNOSIS — S78111D Complete traumatic amputation at level between right hip and knee, subsequent encounter: Secondary | ICD-10-CM

## 2019-07-02 DIAGNOSIS — I10 Essential (primary) hypertension: Secondary | ICD-10-CM

## 2019-07-02 DIAGNOSIS — E1159 Type 2 diabetes mellitus with other circulatory complications: Secondary | ICD-10-CM | POA: Diagnosis not present

## 2019-07-02 DIAGNOSIS — E119 Type 2 diabetes mellitus without complications: Secondary | ICD-10-CM | POA: Diagnosis not present

## 2019-07-02 DIAGNOSIS — I152 Hypertension secondary to endocrine disorders: Secondary | ICD-10-CM

## 2019-07-02 NOTE — Chronic Care Management (AMB) (Signed)
Chronic Care Management   Initial Visit Note  07/02/2019 Name: Carl Bryan MRN: [id number] DOB: February 25, 1946  Referred by: Volney American, PA-C Reason for referral : Chronic Care Management (Initial: RNCM Chronic Disease Management and Care Coordination Needs)   [name] is a 74 y.o. year old male who is a primary care patient of Volney American, [place]. The CCM team was consulted for assistance with chronic disease management and care coordination needs related to CAD, HTN, HLD and DMII  Review of patient status, including review of consultants reports, relevant laboratory and other test results, and collaboration with appropriate care team members and the patient's provider was performed as part of comprehensive patient evaluation and provision of chronic care management services.    SDOH (Social Determinants of Health) assessments performed: Yes See Care Plan activities for detailed interventions related to SDOH  SDOH Interventions     Most Recent Value  SDOH Interventions  SDOH Interventions for the Following Domains  Physical Activity  Physical Activity Interventions  Other (Comments) [the patient is active but no structured activity]       Medications: Outpatient Encounter Medications as of 07/02/2019  Medication Sig  . ACCU-CHEK AVIVA PLUS test strip Check 1 time a day  . allopurinol (ZYLOPRIM) 300 MG tablet Take 1 tablet (300 mg total) by mouth daily.  Marland Kitchen amLODipine (NORVASC) 10 MG tablet Take 1 tablet (10 mg total) by mouth daily.  . Blood Glucose Monitoring Suppl (ACCU-CHEK AVIVA PLUS) w/Device KIT Test 1 time daily  . glipiZIDE (GLUCOTROL) 5 MG tablet Take 1 tablet (5 mg total) by mouth daily before breakfast.  . losartan (COZAAR) 100 MG tablet Take 1 tablet (100 mg total) by mouth daily.  . metFORMIN (GLUCOPHAGE) 500 MG tablet Take 2 tablets (1,000 mg total) by mouth 2 (two) times daily with a meal.  . pantoprazole (PROTONIX) 40 MG tablet Take 1 tablet (40 mg  total) by mouth 2 (two) times daily before a meal.  . rosuvastatin (CRESTOR) 40 MG tablet Take 1 tablet (40 mg total) by mouth daily.   No facility-administered encounter medications on file as of 07/02/2019.     Objective:   BP Readings from Last 3 Encounters:  04/21/19 123/69  03/19/19 135/65  03/11/19 127/71   Lab Results  Component Value Date   HGBA1C 7.3 (H) 04/21/2019   Goals Addressed            This Visit's Progress   . RNCM: Pt-"I like oatmeal cookies" (pt-stated)       CARE PLAN ENTRY (see longtitudinal plan of care for additional care plan information)  Current Barriers:  . Chronic Disease Management support, education, and care coordination needs related to CAD, HTN, HLD, and DMII  Clinical Goal(s) related to CAD, HTN, HLD, and DMII:  Over the next 120 days, patient will:  . Work with the care management team to address educational, disease management, and care coordination needs  . Begin or continue self health monitoring activities as directed today Measure and record cbg (blood glucose) 1 times daily, Measure and record blood pressure 4 times per week, and adhere to a heart healthy/ADA diet . Call provider office for new or worsened signs and symptoms Blood glucose findings outside established parameters, Blood pressure findings outside established parameters, and New or worsened symptom related to CAD, HLD and other chronic health conditions . Call care management team with questions or concerns . Verbalize basic understanding of patient centered plan of care established  today  Interventions related to CAD, HTN, HLD, and DMII:  . Evaluation of current treatment plans and patient's adherence to plan as established by provider . Assessed patient understanding of disease states.  The patients feels he does a good job at managing his care.  . Assessed patient's education and care coordination needs.  Denies any needs at this time from the [facility] team pharmacist or  LCSW . Provided disease specific education to patient- education and support on heart healthy/ADA diet. The patient states  he stays away from salt but he will eat a donut or his favorite oatmeal cookies at times. Education on hemoglobin A1C being < 7.0.  Hemoglobin A1C levels in the last 6 months: 8.0 and 7.3.  The patient reports he checks his blood sugars every am and this am it was 120.   Marland Kitchen Evaluation of safety in the home due to R leg AKA- 1974.  The patient states he has not had any recent falls, tries to be mindful of his surroundings.  Has been through 16 prosthetic legs.  Is currently not using his prosthetic leg due to a fall last year where he injured his right hip.  States he is not having any new concerns related to fall or hip.  Will continue to monitor.  Nash Dimmer with appropriate clinical care team members regarding patient needs  Patient Self Care Activities related to CAD, HTN, HLD, and DMII:  . Patient is unable to independently self-manage chronic health conditions  Initial goal documentation         Mr. Cassaro was given information about Chronic Care Management services today including:  1. CCM service includes personalized support from designated clinical staff supervised by his physician, including individualized plan of care and coordination with other care providers 2. 24/7 contact phone numbers for assistance for urgent and routine care needs. 3. Service will only be billed when office clinical staff spend 20 minutes or more in a month to coordinate care. 4. Only one practitioner may furnish and bill the service in a calendar month. 5. The patient may stop CCM services at any time (effective at the end of the month) by phone call to the office staff. 6. The patient will be responsible for cost sharing (co-pay) of up to 20% of the service fee (after annual deductible is met).  Patient agreed to services and verbal consent obtained.   Plan:   The care management  team will reach out to the patient again over the next 60 days.   Noreene Larsson RN, MSN, [facility] Mobile: [phone]

## 2019-07-02 NOTE — Patient Instructions (Signed)
Visit Information  Goals Addressed            This Visit's Progress   . RNCM: Pt-"I like oatmeal cookies" (pt-stated)       CARE PLAN ENTRY (see longtitudinal plan of care for additional care plan information)  Current Barriers:  . Chronic Disease Management support, education, and care coordination needs related to CAD, HTN, HLD, and DMII  Clinical Goal(s) related to CAD, HTN, HLD, and DMII:  Over the next 120 days, patient will:  . Work with the care management team to address educational, disease management, and care coordination needs  . Begin or continue self health monitoring activities as directed today Measure and record cbg (blood glucose) 1 times daily, Measure and record blood pressure 4 times per week, and adhere to a heart healthy/ADA diet . Call provider office for new or worsened signs and symptoms Blood glucose findings outside established parameters, Blood pressure findings outside established parameters, and New or worsened symptom related to CAD, HLD and other chronic health conditions . Call care management team with questions or concerns . Verbalize basic understanding of patient centered plan of care established today  Interventions related to CAD, HTN, HLD, and DMII:  . Evaluation of current treatment plans and patient's adherence to plan as established by provider . Assessed patient understanding of disease states.  The patients feels he does a good job at managing his care.  . Assessed patient's education and care coordination needs.  Denies any needs at this time from the [facility] team pharmacist or LCSW . Provided disease specific education to patient- education and support on heart healthy/ADA diet. The patient states  he stays away from salt but he will eat a donut or his favorite oatmeal cookies at times. Education on hemoglobin A1C being < 7.0.  Hemoglobin A1C levels in the last 6 months: 8.0 and 7.3.  The patient reports he checks his blood sugars every am and  this am it was 120.   Marland Kitchen Evaluation of safety in the home due to R leg AKA- 1974.  The patient states he has not had any recent falls, tries to be mindful of his surroundings.  Has been through 16 prosthetic legs.  Is currently not using his prosthetic leg due to a fall last year where he injured his right hip.  States he is not having any new concerns related to fall or hip.  Will continue to monitor.  Nash Dimmer with appropriate clinical care team members regarding patient needs  Patient Self Care Activities related to CAD, HTN, HLD, and DMII:  . Patient is unable to independently self-manage chronic health conditions  Initial goal documentation        Carl Bryan was given information about Chronic Care Management services today including:  1. CCM service includes personalized support from designated clinical staff supervised by his physician, including individualized plan of care and coordination with other care providers 2. 24/7 contact phone numbers for assistance for urgent and routine care needs. 3. Service will only be billed when office clinical staff spend 20 minutes or more in a month to coordinate care. 4. Only one practitioner may furnish and bill the service in a calendar month. 5. The patient may stop CCM services at any time (effective at the end of the month) by phone call to the office staff. 6. The patient will be responsible for cost sharing (co-pay) of up to 20% of the service fee (after annual deductible is met).  Patient agreed  to services and verbal consent obtained.   Patient verbalizes understanding of instructions provided today.   The care management team will reach out to the patient again over the next 60 days.   Noreene Larsson RN, MSN, [facility] Mobile: [phone]

## 2019-07-21 ENCOUNTER — Encounter: Payer: Self-pay | Admitting: Family Medicine

## 2019-07-21 ENCOUNTER — Other Ambulatory Visit: Payer: Self-pay

## 2019-07-21 ENCOUNTER — Ambulatory Visit (INDEPENDENT_AMBULATORY_CARE_PROVIDER_SITE_OTHER): Payer: Medicare HMO | Admitting: Family Medicine

## 2019-07-21 VITALS — BP 156/72 | HR 65 | Temp 97.9°F | Wt 162.0 lb

## 2019-07-21 DIAGNOSIS — E119 Type 2 diabetes mellitus without complications: Secondary | ICD-10-CM | POA: Diagnosis not present

## 2019-07-21 DIAGNOSIS — R260 Ataxic gait: Secondary | ICD-10-CM | POA: Diagnosis not present

## 2019-07-21 DIAGNOSIS — E559 Vitamin D deficiency, unspecified: Secondary | ICD-10-CM | POA: Diagnosis not present

## 2019-07-21 DIAGNOSIS — E1169 Type 2 diabetes mellitus with other specified complication: Secondary | ICD-10-CM | POA: Diagnosis not present

## 2019-07-21 DIAGNOSIS — R5383 Other fatigue: Secondary | ICD-10-CM | POA: Diagnosis not present

## 2019-07-21 NOTE — Progress Notes (Signed)
BP (!) 156/72   Pulse 65   Temp 97.9 F (36.6 C) (Oral)   Wt 162 lb (73.5 kg)   SpO2 97%   BMI 28.70 kg/m    Subjective:    Patient ID: Carl Bryan, male    DOB: [birth year], 74 y.o.   MRN: [id number]  HPI: Carl Bryan is a 74 y.o. male  Chief Complaint  Patient presents with  . Diabetes   Fatigue the past month that is out of the ordinary for him. No changes in medications or lifestyle, recent travel, sick contacts, fever, chills, weight loss, SOB.   DM - Overall sugars running 120-150. Taking medicines faithfully without side effects. Trying to watch diet but states there's room for improvement. Denies low blood sugar spells.   Relevant past medical, surgical, family and social history reviewed and updated as indicated. Interim medical history since our last visit reviewed. Allergies and medications reviewed and updated.  Review of Systems  Per HPI unless specifically indicated above     Objective:    BP (!) 156/72   Pulse 65   Temp 97.9 F (36.6 C) (Oral)   Wt 162 lb (73.5 kg)   SpO2 97%   BMI 28.70 kg/m   Wt Readings from Last 3 Encounters:  07/21/19 162 lb (73.5 kg)  04/21/19 155 lb (70.3 kg)  03/19/19 162 lb (73.5 kg)    Physical Exam Vitals and nursing note reviewed.  Constitutional:      Appearance: Normal appearance.  HENT:     Head: Atraumatic.  Eyes:     Extraocular Movements: Extraocular movements intact.     Conjunctiva/sclera: Conjunctivae normal.  Cardiovascular:     Rate and Rhythm: Normal rate and regular rhythm.  Pulmonary:     Effort: Pulmonary effort is normal.     Breath sounds: Normal breath sounds.  Musculoskeletal:     Cervical back: Normal range of motion and neck supple.     Comments: ROM at baseline  Skin:    General: Skin is warm and dry.  Neurological:     General: No focal deficit present.     Mental Status: He is oriented to person, place, and time.  Psychiatric:        Mood and Affect: Mood normal.        Thought  Content: Thought content normal.        Judgment: Judgment normal.     Results for orders placed or performed in visit on 07/21/19  HgB A1c  Result Value Ref Range   Hgb A1c MFr Bld 8.2 (H) 4.8 - 5.6 %   Est. average glucose Bld gHb Est-mCnc 189 mg/dL  CBC with Differential/Platelet  Result Value Ref Range   WBC 10.2 3.4 - 10.8 x10E3/uL   RBC 5.06 4.14 - 5.80 x10E6/uL   Hemoglobin 15.3 13.0 - 17.7 g/dL   Hematocrit 44.6 37.5 - 51.0 %   MCV 88 79 - 97 fL   MCH 30.2 26.6 - 33.0 pg   MCHC 34.3 31.5 - 35.7 g/dL   RDW 14.5 11.6 - 15.4 %   Platelets 247 150 - 450 x10E3/uL   Neutrophils 72 Not Estab. %   Lymphs 18 Not Estab. %   Monocytes 6 Not Estab. %   Eos 2 Not Estab. %   Basos 1 Not Estab. %   Neutrophils Absolute 7.5 (H) 1.4 - 7.0 x10E3/uL   Lymphocytes Absolute 1.8 0.7 - 3.1 x10E3/uL   Monocytes Absolute 0.6 0.1 -  0.9 x10E3/uL   EOS (ABSOLUTE) 0.2 0.0 - 0.4 x10E3/uL   Basophils Absolute 0.1 0.0 - 0.2 x10E3/uL   Immature Granulocytes 1 Not Estab. 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 Discussed good diet and exercise, regular vitamins, sleep hygiene   Relevant Orders   CBC with Differential/Platelet (Completed)   TSH (Completed)     VITAMIN D 25 Hydroxy (Vit-D Deficiency, Fractures) (Completed)   Vitamin B12 (Completed)       Follow up plan: Return in about 3 months (around 10/20/2019) for 6 month f/u.

## 2019-07-22 LAB — CBC WITH DIFFERENTIAL/PLATELET
Basophils Absolute: 0.1 10*3/uL (ref 0.0–0.2)
Basos: 1 %
EOS (ABSOLUTE): 0.2 10*3/uL (ref 0.0–0.4)
Eos: 2 %
Hematocrit: 44.6 % (ref 37.5–51.0)
Hemoglobin: 15.3 g/dL (ref 13.0–17.7)
Immature Grans (Abs): 0.1 10*3/uL (ref 0.0–0.1)
Immature Granulocytes: 1 %
Lymphocytes Absolute: 1.8 10*3/uL (ref 0.7–3.1)
Lymphs: 18 %
MCH: 30.2 pg (ref 26.6–33.0)
MCHC: 34.3 g/dL (ref 31.5–35.7)
MCV: 88 fL (ref 79–97)
Monocytes Absolute: 0.6 10*3/uL (ref 0.1–0.9)
Monocytes: 6 %
Neutrophils Absolute: 7.5 10*3/uL — ABNORMAL HIGH (ref 1.4–7.0)
Neutrophils: 72 %
Platelets: 247 10*3/uL (ref 150–450)
RBC: 5.06 x10E6/uL (ref 4.14–5.80)
RDW: 14.5 % (ref 11.6–15.4)
WBC: 10.2 10*3/uL (ref 3.4–10.8)

## 2019-07-22 LAB — HEMOGLOBIN A1C
Est. average glucose Bld gHb Est-mCnc: 189 mg/dL
Hgb A1c MFr Bld: 8.2 % — ABNORMAL HIGH (ref 4.8–5.6)

## 2019-07-22 LAB — TSH: TSH: 2.19 u[IU]/mL (ref 0.450–4.500)

## 2019-07-22 LAB — VITAMIN D 25 HYDROXY (VIT D DEFICIENCY, FRACTURES): Vit D, 25-Hydroxy: 14.7 ng/mL — ABNORMAL LOW (ref 30.0–100.0)

## 2019-07-22 LAB — VITAMIN B12: Vitamin B-12: 236 pg/mL (ref 232–1245)

## 2019-07-23 ENCOUNTER — Other Ambulatory Visit: Payer: Self-pay | Admitting: Family Medicine

## 2019-07-23 MED ORDER — VITAMIN D (ERGOCALCIFEROL) 1.25 MG (50000 UNIT) PO CAPS: 50000.0000 [IU] | ORAL_CAPSULE | ORAL | 1 refills | Status: DC

## 2019-07-24 ENCOUNTER — Telehealth: Payer: Self-pay | Admitting: Family Medicine

## 2019-07-24 NOTE — Telephone Encounter (Signed)
Provider notified   Copied from CRM [id number]. Topic: General - Inquiry >> Jul 24, 2019  2:58 PM Leafy Ro wrote: Reason for CRM: pt wife is calling and she wanted rachel to know her husband was not fasting for his blood work 07/21/2019. Pt went to [facility] prior to appt

## 2019-07-28 DIAGNOSIS — E559 Vitamin D deficiency, unspecified: Secondary | ICD-10-CM | POA: Insufficient documentation

## 2019-07-28 NOTE — Assessment & Plan Note (Signed)
Recheck A1C, continue working on lifestyle modifications to improve control

## 2019-07-31 ENCOUNTER — Ambulatory Visit (INDEPENDENT_AMBULATORY_CARE_PROVIDER_SITE_OTHER): Payer: Medicare HMO

## 2019-07-31 DIAGNOSIS — Z0000 Encounter for general adult medical examination without abnormal findings: Secondary | ICD-10-CM

## 2019-07-31 NOTE — Progress Notes (Signed)
Subjective:   Carl Bryan is a 74 y.o. male who presents for Medicare Annual/Subsequent preventive examination.  This visit is being conducted via phone call  - after an attmept to do on video chat - due to the COVID-19 pandemic. This patient has given me verbal consent via phone to conduct this visit, patient states they are participating from their home address. Some vital signs may be absent or patient reported.   Patient identification: identified by name, DOB, and current address.    Review of Systems:   Cardiac Risk Factors include: male gender;hypertension;advanced age (>63mn, >>39women);dyslipidemia     Objective:    Vitals: There were no vitals taken for this visit.  There is no height or weight on file to calculate BMI.  Advanced Directives 03/06/2019 03/05/2019 11/20/2018 07/31/2018 07/30/2017  Does Patient Have a Medical Advance Directive? No No No No No  Would patient like information on creating a medical advance directive? - No - Patient declined No - Patient declined No - Patient declined No - Patient declined    Tobacco Social History   Tobacco Use  Smoking Status Former Smoker  . Types: Cigarettes  . Quit date: 09/29/1964  . Years since quitting: 54.8  Smokeless Tobacco Never Used     Counseling given: Not Answered   Clinical Intake:  Pre-visit preparation completed: Yes  Pain : No/denies pain     Nutritional Risks: None Diabetes: Yes CBG done?: No Did pt. bring in CBG monitor from home?: No  How often do you need to have someone help you when you read instructions, pamphlets, or other written materials from your doctor or pharmacy?: 1 - Never  Interpreter Needed?: No  Information entered by :: Krislyn Donnan,LPN  Past Medical History:  Diagnosis Date  . Bleeding ulcer   . Bronchospasm   . Diabetes mellitus without complication (HCC)    Type 2  . Diverticulosis   . Pure hypercholesterolemia   . Traumatic amputation of leg above knee ([facility]     Past Surgical History:  Procedure Laterality Date  . ABOVE KNEE LEG AMPUTATION  1971  . APPENDECTOMY    . KNEE SURGERY     x 2.   Family History  Problem Relation Age of Onset  . Cancer Mother        Colon cancer  . Lung disease Father    Social History   Socioeconomic History  . Marital status: Married    Spouse name: Not on file  . Number of children: Not on file  . Years of education: Not on file  . Highest education level: 6th grade  Occupational History  . Occupation: Retired  Tobacco Use  . Smoking status: Former Smoker    Types: Cigarettes    Quit date: 09/29/1964    Years since quitting: 54.8  . Smokeless tobacco: Never Used  Substance and Sexual Activity  . Alcohol use: No    Alcohol/week: 0.0 standard drinks  . Drug use: No  . Sexual activity: Not on file  Other Topics Concern  . Not on file  Social History Narrative  . Not on file   Social Determinants of Health   Financial Resource Strain: Low Risk   . Difficulty of Paying Living Expenses: Not hard at all  Food Insecurity: No Food Insecurity  . Worried About RCharity fundraiserin the Last Year: Never true  . Ran Out of Food in the Last Year: Never true  Transportation Needs: No  Transportation Needs  . Lack of Transportation (Medical): No  . Lack of Transportation (Non-Medical): No  Physical Activity: Inactive  . Days of Exercise per Week: 0 days  . Minutes of Exercise per Session: 0 min  Stress: No Stress Concern Present  . Feeling of Stress : Not at all  Social Connections: Not Isolated  . Frequency of Communication with Friends and Family: More than three times a week  . Frequency of Social Gatherings with Friends and Family: More than three times a week  . Attends Religious Services: More than 4 times per year  . Active Member of Clubs or Organizations: Yes  . Attends Archivist Meetings: More than 4 times per year  . Marital Status: Married    Outpatient Encounter  Medications as of 07/31/2019  Medication Sig  . ACCU-CHEK AVIVA PLUS test strip Check 1 time a day  . allopurinol (ZYLOPRIM) 300 MG tablet Take 1 tablet (300 mg total) by mouth daily.  Marland Kitchen amLODipine (NORVASC) 10 MG tablet Take 1 tablet (10 mg total) by mouth daily.  . Blood Glucose Monitoring Suppl (ACCU-CHEK AVIVA PLUS) w/Device KIT Test 1 time daily  . glipiZIDE (GLUCOTROL) 5 MG tablet Take 1 tablet (5 mg total) by mouth daily before breakfast.  . losartan (COZAAR) 100 MG tablet Take 1 tablet (100 mg total) by mouth daily.  . meloxicam (MOBIC) 15 MG tablet   . metFORMIN (GLUCOPHAGE) 500 MG tablet Take 2 tablets (1,000 mg total) by mouth 2 (two) times daily with a meal.  . pantoprazole (PROTONIX) 40 MG tablet Take 1 tablet (40 mg total) by mouth 2 (two) times daily before a meal.  . rosuvastatin (CRESTOR) 40 MG tablet Take 1 tablet (40 mg total) by mouth daily.  . Vitamin D, Ergocalciferol, (DRISDOL) 1.25 MG (50000 UNIT) CAPS capsule Take 1 capsule (50,000 Units total) by mouth every 7 (seven) days.   No facility-administered encounter medications on file as of 07/31/2019.    Activities of Daily Living In your present state of health, do you have any difficulty performing the following activities: 07/31/2019 03/07/2019  Hearing? N Y  Comment right ear hearing aid -  Vision? N N  Comment Dr.Shaprio -  Difficulty concentrating or making decisions? N N  Walking or climbing stairs? N N  Dressing or bathing? N N  Doing errands, shopping? N N  Preparing Food and eating ? N -  Using the Toilet? N -  In the past six months, have you accidently leaked urine? N -  Do you have problems with loss of bowel control? N -  Managing your Medications? N -  Managing your Finances? N -  Housekeeping or managing your Housekeeping? N -  Some recent data might be hidden    Patient Care Team: Volney American, PA-C as PCP - General (Family Medicine) Vanita Ingles, RN as Case Manager ([place])   Assessment:   This is a routine wellness examination for Carl Bryan.  Exercise Activities and Dietary recommendations Current Exercise Habits: Home exercise routine, Type of exercise: walking, Time (Minutes): 20, Frequency (Times/Week): 7, Weekly Exercise (Minutes/Week): 140, Intensity: Mild, Exercise limited by: None identified  Goals Addressed   None     Fall Risk: Fall Risk  07/31/2019 07/31/2018 02/13/2018 07/30/2017 04/24/2017  Falls in the past year? 0 0 1 No No  Number falls in past yr: 0 - - - -  Injury with Fall? 0 - 0 - -  Risk for fall due  to : - - History of fall(s);Other (Comment);Impaired balance/gait - -  Follow up - - Falls evaluation completed - -    FALL RISK PREVENTION PERTAINING TO THE HOME:  Any stairs in or around the home? Yes  If so, are there any without handrails? No   Home free of loose throw rugs in walkways, pet beds, electrical cords, etc? Yes  Adequate lighting in your home to reduce risk of falls? Yes   ASSISTIVE DEVICES UTILIZED TO PREVENT FALLS:  Life alert? No  Use of a cane, walker or w/c? Yes  crrutches, has  Walker if needed  Grab bars in the bathroom? Yes  Shower chair or bench in shower? Yes  Elevated toilet seat or a handicapped toilet? Yes   TIMED UP AND GO:  Unable to perform   Depression Screen PHQ 2/9 Scores 07/31/2019 07/02/2019 07/31/2018 07/30/2017  PHQ - 2 Score 0 0 0 0    Cognitive Function     6CIT Screen 07/30/2017  What Year? 0 points  What month? 0 points  What time? 0 points  Count back from 20 0 points  Months in reverse 0 points  Repeat phrase 2 points  Total Score 2    Immunization History  Administered Date(s) Administered  . Fluad Quad(high Dose 65+) 01/23/2019  . Influenza, High Dose Seasonal PF 01/19/2016, 01/17/2017, 01/14/2018  . Influenza,inj,Quad PF,6+ Mos 12/31/2014  . Influenza-Unspecified 02/11/2014  . Moderna SARS-COVID-2 Vaccination 06/11/2019, 07/09/2019  . Pneumococcal Conjugate-13  10/08/2013  . Pneumococcal Polysaccharide-23 07/06/2015  . Pneumococcal-Unspecified 05/20/2008  . Td 12/02/2003  . Tdap 10/19/2015    Qualifies for Shingles Vaccine? Yes  Zostavax completed n/a. Due for Shingrix. Education has been provided regarding the importance of this vaccine. Pt has been advised to call insurance company to determine out of pocket expense. Advised may also receive vaccine at local pharmacy or Health Dept. Verbalized acceptance and understanding.  Tdap: up to date   Flu Vaccine: up to date   Pneumococcal Vaccine: up to date   Covid-19 Vaccine: Completed vaccines  Screening Tests Health Maintenance  Topic Date Due  . OPHTHALMOLOGY EXAM  07/28/2018  . FOOT EXAM  09/19/2019  . INFLUENZA VACCINE  11/09/2019  . HEMOGLOBIN A1C  01/20/2020  . COLONOSCOPY  04/10/2022  . TETANUS/TDAP  10/18/2025  . COVID-19 Vaccine  Completed  . Hepatitis C Screening  Completed  . PNA vac Low Risk Adult  Completed   Cancer Screenings:  Colorectal Screening: Completed 2014. Repeat every 10 years  Lung Cancer Screening: (Low Dose CT Chest recommended if Age 20-80 years, 30 pack-year currently smoking OR have quit w/in 15years.) does qualify.    Additional Screening:  Hepatitis C Screening: does qualify  Vision Screening: Recommended annual ophthalmology exams for early detection of glaucoma and other disorders of the eye. Is the patient up to date with their annual eye exam?  Yes  Who is the provider or what is the name of the office in which the pt attends annual eye exams? Dr.Shaprio, scheduled appt. Will have him fax over results of diabetic eye exam.    Dental Screening: Recommended annual dental exams for proper oral hygiene  Community Resource Referral:  CRR required this visit?  No        Plan:  I have personally reviewed and addressed the Medicare Annual Wellness questionnaire and have noted the following in the patient's chart:  A. Medical and social  history B. Use of alcohol, tobacco or illicit drugs  C. Current medications and supplements D. Functional ability and status E.  Nutritional status F.  Physical activity G. Advance directives H. List of other physicians I.  Hospitalizations, surgeries, and ER visits in previous 12 months J.  [place] such as hearing and vision if needed, cognitive and depression L. Referrals and appointments   In addition, I have reviewed and discussed with patient certain preventive protocols, quality metrics, and best practice recommendations. A written personalized care plan for preventive services as well as general preventive health recommendations were provided to patient.   Signed,   Bevelyn Ngo, LPN  07/04/6145 Nurse Health Advisor   Nurse Notes: none

## 2019-07-31 NOTE — Patient Instructions (Signed)
Carl Bryan , Thank you for taking time to come for your Medicare Wellness Visit. I appreciate your ongoing commitment to your health goals. Please review the following plan we discussed and let me know if I can assist you in the future.   Screening recommendations/referrals: Colonoscopy: completed 2014, due 2024 Recommended yearly ophthalmology/optometry visit for glaucoma screening and checkup Recommended yearly dental visit for hygiene and checkup  Vaccinations: Influenza vaccine: up to date  Pneumococcal vaccine: up to date  Tdap vaccine: up to date  Shingles vaccine: shingrix eligible    Covid-19: completed   Advanced directives: Advance directive discussed with you today. I have provided a copy for you to complete at home and have notarized. Once this is complete please bring a copy in to our office so we can scan it into your chart.  Conditions/risks identified: diabetic, please have eye dr fax over results once seen.   Next appointment: Follow up in one year for your annual wellness visit.   Preventive Care 60 Years and Older, Male Preventive care refers to lifestyle choices and visits with your health care provider that can promote health and wellness. What does preventive care include?  A yearly physical exam. This is also called an annual well check.  Dental exams once or twice a year.  Routine eye exams. Ask your health care provider how often you should have your eyes checked.  Personal lifestyle choices, including:  Daily care of your teeth and gums.  Regular physical activity.  Eating a healthy diet.  Avoiding tobacco and drug use.  Limiting alcohol use.  Practicing safe sex.  Taking low doses of aspirin every day.  Taking vitamin and mineral supplements as recommended by your health care provider. What happens during an annual well check? The services and screenings done by your health care provider during your annual well check will depend on your age,  overall health, lifestyle risk factors, and family history of disease. Counseling  Your health care provider may ask you questions about your:  Alcohol use.  Tobacco use.  Drug use.  Emotional well-being.  Home and relationship well-being.  Sexual activity.  Eating habits.  History of falls.  Memory and ability to understand (cognition).  Work and work Astronomer. Screening  You may have the following tests or measurements:  Height, weight, and BMI.  Blood pressure.  Lipid and cholesterol levels. These may be checked every 5 years, or more frequently if you are over 34 years old.  Skin check.  Lung cancer screening. You may have this screening every year starting at age 43 if you have a 30-pack-year history of smoking and currently smoke or have quit within the past 15 years.  Fecal occult blood test (FOBT) of the stool. You may have this test every year starting at age 67.  Flexible sigmoidoscopy or colonoscopy. You may have a sigmoidoscopy every 5 years or a colonoscopy every 10 years starting at age 62.  Prostate cancer screening. Recommendations will vary depending on your family history and other risks.  Hepatitis C blood test.  Hepatitis B blood test.  Sexually transmitted disease (STD) testing.  Diabetes screening. This is done by checking your blood sugar (glucose) after you have not eaten for a while (fasting). You may have this done every 1-3 years.  Abdominal aortic aneurysm (AAA) screening. You may need this if you are a current or former smoker.  Osteoporosis. You may be screened starting at age 58 if you are at high risk.  Talk with your health care provider about your test results, treatment options, and if necessary, the need for more tests. Vaccines  Your health care provider may recommend certain vaccines, such as:  Influenza vaccine. This is recommended every year.  Tetanus, diphtheria, and acellular pertussis (Tdap, Td) vaccine. You may  need a Td booster every 10 years.  Zoster vaccine. You may need this after age 79.  Pneumococcal 13-valent conjugate (PCV13) vaccine. One dose is recommended after age 46.  Pneumococcal polysaccharide (PPSV23) vaccine. One dose is recommended after age 48. Talk to your health care provider about which screenings and vaccines you need and how often you need them. This information is not intended to replace advice given to you by your health care provider. Make sure you discuss any questions you have with your health care provider. Document Released: 04/23/2015 Document Revised: 12/15/2015 Document Reviewed: 01/26/2015 Elsevier Interactive Patient Education  2017 [place] Prevention in the Home Falls can cause injuries. They can happen to people of all ages. There are many things you can do to make your home safe and to help prevent falls. What can I do on the outside of my home?  Regularly fix the edges of walkways and driveways and fix any cracks.  Remove anything that might make you trip as you walk through a door, such as a raised step or threshold.  Trim any bushes or trees on the path to your home.  Use bright outdoor lighting.  Clear any walking paths of anything that might make someone trip, such as rocks or tools.  Regularly check to see if handrails are loose or broken. Make sure that both sides of any steps have handrails.  Any raised decks and porches should have guardrails on the edges.  Have any leaves, snow, or ice cleared regularly.  Use sand or salt on walking paths during winter.  Clean up any spills in your garage right away. This includes oil or grease spills. What can I do in the bathroom?  Use night lights.  Install grab bars by the toilet and in the tub and shower. Do not use towel bars as grab bars.  Use non-skid mats or decals in the tub or shower.  If you need to sit down in the shower, use a plastic, non-slip stool.  Keep the floor  dry. Clean up any water that spills on the floor as soon as it happens.  Remove soap buildup in the tub or shower regularly.  Attach bath mats securely with double-sided non-slip rug tape.  Do not have throw rugs and other things on the floor that can make you trip. What can I do in the bedroom?  Use night lights.  Make sure that you have a light by your bed that is easy to reach.  Do not use any sheets or blankets that are too big for your bed. They should not hang down onto the floor.  Have a firm chair that has side arms. You can use this for support while you get dressed.  Do not have throw rugs and other things on the floor that can make you trip. What can I do in the kitchen?  Clean up any spills right away.  Avoid walking on wet floors.  Keep items that you use a lot in easy-to-reach places.  If you need to reach something above you, use a strong step stool that has a grab bar.  Keep electrical cords out of the way.  Do not use floor polish or wax that makes floors slippery. If you must use wax, use non-skid floor wax.  Do not have throw rugs and other things on the floor that can make you trip. What can I do with my stairs?  Do not leave any items on the stairs.  Make sure that there are handrails on both sides of the stairs and use them. Fix handrails that are broken or loose. Make sure that handrails are as long as the stairways.  Check any carpeting to make sure that it is firmly attached to the stairs. Fix any carpet that is loose or worn.  Avoid having throw rugs at the top or bottom of the stairs. If you do have throw rugs, attach them to the floor with carpet tape.  Make sure that you have a light switch at the top of the stairs and the bottom of the stairs. If you do not have them, ask someone to add them for you. What else can I do to help prevent falls?  Wear shoes that:  Do not have high heels.  Have rubber bottoms.  Are comfortable and fit you  well.  Are closed at the toe. Do not wear sandals.  If you use a stepladder:  Make sure that it is fully opened. Do not climb a closed stepladder.  Make sure that both sides of the stepladder are locked into place.  Ask someone to hold it for you, if possible.  Clearly mark and make sure that you can see:  Any grab bars or handrails.  First and last steps.  Where the edge of each step is.  Use tools that help you move around (mobility aids) if they are needed. These include:  Canes.  Walkers.  Scooters.  Crutches.  Turn on the lights when you go into a dark area. Replace any light bulbs as soon as they burn out.  Set up your furniture so you have a clear path. Avoid moving your furniture around.  If any of your floors are uneven, fix them.  If there are any pets around you, be aware of where they are.  Review your medicines with your doctor. Some medicines can make you feel dizzy. This can increase your chance of falling. Ask your doctor what other things that you can do to help prevent falls. This information is not intended to replace advice given to you by your health care provider. Make sure you discuss any questions you have with your health care provider. Document Released: 01/21/2009 Document Revised: 09/02/2015 Document Reviewed: 05/01/2014 Elsevier Interactive Patient Education  2017 [employer].

## 2019-08-08 ENCOUNTER — Other Ambulatory Visit: Payer: Self-pay | Admitting: Family Medicine

## 2019-08-08 NOTE — Telephone Encounter (Signed)
Requested Prescriptions  Pending Prescriptions Disp Refills  . pantoprazole (PROTONIX) 40 MG tablet [Pharmacy Med Name: PANTOPRAZOLE SODIUM 40 MG Tablet Delayed Release] 180 tablet 0    Sig: TAKE 1 TABLET 2 TIMES DAILY BEFORE A MEAL.     Gastroenterology: Proton Pump Inhibitors Passed - 08/08/2019  6:35 PM      Passed - Valid encounter within last 12 months    Recent Outpatient Visits          2 weeks ago Type 2 diabetes mellitus without complication, without long-term current use of insulin [facility])   [facility] [place], [place], [place]   3 months ago Diabetes mellitus associated with hormonal etiology [facility])   [facility] Ro[place] Maser [place], [place]   4 months ago Acute lower UTI   [facility], [place], [place]   10 months ago Type 2 diabetes mellitus without complication, without long-term current use of insulin [facility])   [facility] [place], Redge Gainer, MD   10 months ago Essential (primary) hypertension   [facility] Dorcas Carrow, DO      Future Appointments            In 2 months Maurice March, Salley Hews, PA-C [facility]   In 12 months  [facility]

## 2019-09-01 DIAGNOSIS — E119 Type 2 diabetes mellitus without complications: Secondary | ICD-10-CM | POA: Diagnosis not present

## 2019-09-01 LAB — HM DIABETES EYE EXAM

## 2019-09-03 ENCOUNTER — Telehealth: Payer: Self-pay

## 2019-09-03 ENCOUNTER — Ambulatory Visit: Payer: Self-pay | Admitting: General Practice

## 2019-09-03 NOTE — Chronic Care Management (AMB) (Signed)
°  Chronic Care Management   Outreach Note  09/03/2019 Name: Carl Bryan MRN: [id number] DOB: 04-13-1945  Referred by: Particia Nearing, PA-C Reason for referral : Chronic Care Management (Follow up on RNCM Chronic Disease Management and Care coordination needs)   An unsuccessful telephone outreach was attempted today. The patient was referred to the case management team for assistance with care management and care coordination.   Follow Up Plan: The care management team will reach out to the patient again over the next 30 to 60 days.   Alto Denver RN, MSN, CCM Community Care Coordinator [place]   [facility] Mobile: [phone]

## 2019-09-26 ENCOUNTER — Other Ambulatory Visit: Payer: Self-pay

## 2019-09-26 MED ORDER — ACCU-CHEK AVIVA PLUS VI STRP: ORAL_STRIP | 12 refills | Status: DC

## 2019-09-26 NOTE — Telephone Encounter (Signed)
Patient last seen 07/21/19

## 2019-10-02 ENCOUNTER — Other Ambulatory Visit: Payer: Self-pay | Admitting: Family Medicine

## 2019-10-02 MED ORDER — ACCU-CHEK AVIVA PLUS VI STRP: ORAL_STRIP | 12 refills | Status: DC

## 2019-10-14 DIAGNOSIS — M1712 Unilateral primary osteoarthritis, left knee: Secondary | ICD-10-CM | POA: Diagnosis not present

## 2019-10-20 ENCOUNTER — Ambulatory Visit: Payer: Medicare HMO | Admitting: Family Medicine

## 2019-10-24 ENCOUNTER — Ambulatory Visit (INDEPENDENT_AMBULATORY_CARE_PROVIDER_SITE_OTHER): Payer: Medicare HMO | Admitting: General Practice

## 2019-10-24 ENCOUNTER — Telehealth: Payer: Self-pay | Admitting: General Practice

## 2019-10-24 ENCOUNTER — Telehealth: Payer: Self-pay

## 2019-10-24 DIAGNOSIS — I2510 Atherosclerotic heart disease of native coronary artery without angina pectoris: Secondary | ICD-10-CM

## 2019-10-24 DIAGNOSIS — E1159 Type 2 diabetes mellitus with other circulatory complications: Secondary | ICD-10-CM

## 2019-10-24 DIAGNOSIS — I10 Essential (primary) hypertension: Secondary | ICD-10-CM | POA: Diagnosis not present

## 2019-10-24 DIAGNOSIS — I152 Hypertension secondary to endocrine disorders: Secondary | ICD-10-CM

## 2019-10-24 DIAGNOSIS — E119 Type 2 diabetes mellitus without complications: Secondary | ICD-10-CM | POA: Diagnosis not present

## 2019-10-24 DIAGNOSIS — S78111D Complete traumatic amputation at level between right hip and knee, subsequent encounter: Secondary | ICD-10-CM

## 2019-10-24 DIAGNOSIS — I2583 Coronary atherosclerosis due to lipid rich plaque: Secondary | ICD-10-CM | POA: Diagnosis not present

## 2019-10-24 DIAGNOSIS — E7800 Pure hypercholesterolemia, unspecified: Secondary | ICD-10-CM

## 2019-10-24 NOTE — Patient Instructions (Signed)
Visit Information  Goals Addressed              This Visit's Progress     RNCM: Pt-"I like oatmeal cookies" (pt-stated)        CARE PLAN ENTRY (see longtitudinal plan of care for additional care plan information)  Current Barriers:   Chronic Disease Management support, education, and care coordination needs related to CAD, HTN, HLD, and DMII  Clinical Goal(s) related to CAD, HTN, HLD, and DMII:  Over the next 120 days, patient will:   Work with the care management team to address educational, disease management, and care coordination needs   Begin or continue self health monitoring activities as directed today Measure and record cbg (blood glucose) 2 times daily, Measure and record blood pressure 4 times per week, and adhere to a heart healthy/ADA diet  Call provider office for new or worsened signs and symptoms Blood glucose findings outside established parameters, Blood pressure findings outside established parameters, and New or worsened symptom related to CAD, HLD and other chronic health conditions  Call care management team with questions or concerns  Verbalize basic understanding of patient centered plan of care established today  Interventions related to CAD, HTN, HLD, and DMII:   Evaluation of current treatment plans and patient's adherence to plan as established by provider.  The patient is adherent to the plan of care.  Has his wife to help with his care.   Assessed patient understanding of disease states.  The patients feels he does a good job at managing his care.   Assessed patient's education and care coordination needs.  Denies any needs at this time from the CCM team pharmacist or LCSW  Provided disease specific education to patient- education and support on heart healthy/ADA diet. The patient states  he stays away from salt but he will eat a donut or his favorite oatmeal cookies at times. Education on hemoglobin A1C being < 7.0.  Hemoglobin A1C levels in the  last 6 months: 8.0 and 7.3.  The patient reports he checks his blood sugars every am and this am it was 120.  The patients wife states that his blood sugar was low this am. He is eating good.  He is enjoying fishing and being outside. He likes to color on his wife's phone.  His wife states that when he gets off track she helps him get back on track.   Evaluation of safety in the home due to R leg AKA- 1974.  The patient states he has not had any recent falls, tries to be mindful of his surroundings.  Has been through 16 prosthetic legs.  Is currently not using his prosthetic leg due to a fall last year where he injured his right hip.  States he is not having any new concerns related to fall or hip.  Will continue to monitor.   Collaborated with appropriate clinical care team members regarding patient needs  Patient Self Care Activities related to CAD, HTN, HLD, and DMII:   Patient is unable to independently self-manage chronic health conditions  Please see past updates related to this goal by clicking on the "Past Updates" button in the selected goal         Patient verbalizes understanding of instructions provided today.   The care management team will reach out to the patient again over the next 60 to 90 days.   Alto Denver RN, MSN, CCM Community Care Coordinator [place]   [facility] Mobile: [phone]

## 2019-10-24 NOTE — Chronic Care Management (AMB) (Signed)
Chronic Care Management   Follow Up Note   10/24/2019 Name: MONNIE ANSPACH MRN: [id number] DOB: [birth year]  Referred by: Volney American, PA-C Reason for referral : Chronic Care Management (RNCM Follow up: Chronic Disease Management and Care Coordination Need)   MARJORIE LUSSIER is a 74 y.o. year old male who is a primary care patient of Volney American, [place]. The CCM team was consulted for assistance with chronic disease management and care coordination needs.    Review of patient status, including review of consultants reports, relevant laboratory and other test results, and collaboration with appropriate care team members and the patient's provider was performed as part of comprehensive patient evaluation and provision of chronic care management services.    SDOH (Social Determinants of Health) assessments performed: Yes See Care Plan activities for detailed interventions related to [facility])     Outpatient Encounter Medications as of 10/24/2019  Medication Sig  . ACCU-CHEK AVIVA PLUS test strip Check 1 time a day  . allopurinol (ZYLOPRIM) 300 MG tablet Take 1 tablet (300 mg total) by mouth daily.  Marland Kitchen amLODipine (NORVASC) 10 MG tablet Take 1 tablet (10 mg total) by mouth daily.  . Blood Glucose Monitoring Suppl (ACCU-CHEK AVIVA PLUS) w/Device KIT Test 1 time daily  . glipiZIDE (GLUCOTROL) 5 MG tablet Take 1 tablet (5 mg total) by mouth daily before breakfast.  . losartan (COZAAR) 100 MG tablet Take 1 tablet (100 mg total) by mouth daily.  . meloxicam (MOBIC) 15 MG tablet   . metFORMIN (GLUCOPHAGE) 500 MG tablet Take 2 tablets (1,000 mg total) by mouth 2 (two) times daily with a meal.  . pantoprazole (PROTONIX) 40 MG tablet TAKE 1 TABLET 2 TIMES DAILY BEFORE A MEAL.  . rosuvastatin (CRESTOR) 40 MG tablet Take 1 tablet (40 mg total) by mouth daily.  . Vitamin D, Ergocalciferol, (DRISDOL) 1.25 MG (50000 UNIT) CAPS capsule Take 1 capsule (50,000 Units total) by mouth every 7 (seven) days.    No facility-administered encounter medications on file as of 10/24/2019.     Objective:  BP Readings from Last 3 Encounters:  07/21/19 (!) 156/72  04/21/19 123/69  03/19/19 135/65    Goals Addressed              This Visit's Progress   .  RNCM: Pt-"I like oatmeal cookies" (pt-stated)        CARE PLAN ENTRY (see longtitudinal plan of care for additional care plan information)  Current Barriers:  . Chronic Disease Management support, education, and care coordination needs related to CAD, HTN, HLD, and DMII  Clinical Goal(s) related to CAD, HTN, HLD, and DMII:  Over the next 120 days, patient will:  . Work with the care management team to address educational, disease management, and care coordination needs  . Begin or continue self health monitoring activities as directed today Measure and record cbg (blood glucose) 2 times daily, Measure and record blood pressure 4 times per week, and adhere to a heart healthy/ADA diet . Call provider office for new or worsened signs and symptoms Blood glucose findings outside established parameters, Blood pressure findings outside established parameters, and New or worsened symptom related to CAD, HLD and other chronic health conditions . Call care management team with questions or concerns . Verbalize basic understanding of patient centered plan of care established today  Interventions related to CAD, HTN, HLD, and DMII:  . Evaluation of current treatment plans and patient's adherence to plan as established by provider.  The patient is adherent to the plan of care.  Has his wife to help with his care.  . Assessed patient understanding of disease states.  The patients feels he does a good job at managing his care.  . Assessed patient's education and care coordination needs.  Denies any needs at this time from the [facility] team pharmacist or LCSW . Provided disease specific education to patient- education and support on heart healthy/ADA diet. The  patient states  he stays away from salt but he will eat a donut or his favorite oatmeal cookies at times. Education on hemoglobin A1C being < 7.0.  Hemoglobin A1C levels in the last 6 months: 8.0 and 7.3.  The patient reports he checks his blood sugars every am and this am it was 120.  The patients wife states that his blood sugar was low this am. He is eating good.  He is enjoying fishing and being outside. He likes to color on his wife's phone.  His wife states that when he gets off track she helps him get back on track.  . Evaluation of safety in the home due to R leg AKA- 1974.  The patient states he has not had any recent falls, tries to be mindful of his surroundings.  Has been through 16 prosthetic legs.  Is currently not using his prosthetic leg due to a fall last year where he injured his right hip.  States he is not having any new concerns related to fall or hip.  Will continue to monitor.  Nash Dimmer with appropriate clinical care team members regarding patient needs  Patient Self Care Activities related to CAD, HTN, HLD, and DMII:  . Patient is unable to independently self-manage chronic health conditions  Please see past updates related to this goal by clicking on the "Past Updates" button in the selected goal          Plan:   The care management team will reach out to the patient again over the next 60 to 90 days.    Noreene Larsson RN, MSN, [facility] Mobile: [phone]

## 2019-12-01 ENCOUNTER — Other Ambulatory Visit: Payer: Self-pay | Admitting: Family Medicine

## 2019-12-01 DIAGNOSIS — E7800 Pure hypercholesterolemia, unspecified: Secondary | ICD-10-CM

## 2019-12-01 DIAGNOSIS — M109 Gout, unspecified: Secondary | ICD-10-CM

## 2019-12-01 DIAGNOSIS — E119 Type 2 diabetes mellitus without complications: Secondary | ICD-10-CM

## 2019-12-01 DIAGNOSIS — I10 Essential (primary) hypertension: Secondary | ICD-10-CM

## 2019-12-01 MED ORDER — PANTOPRAZOLE SODIUM 40 MG PO TBEC: DELAYED_RELEASE_TABLET | ORAL | 3 refills | Status: DC

## 2019-12-01 MED ORDER — ACCU-CHEK AVIVA PLUS VI STRP: ORAL_STRIP | 12 refills | Status: DC

## 2019-12-01 NOTE — Telephone Encounter (Signed)
Requested medication (s) are due for refill today: Yes  Requested medication (s) are on the active medication list: Yes  Last refill: Expired Rx  Future visit scheduled: Yes  Notes to clinic: Expired Rx, unable to refll     Requested Prescriptions  Pending Prescriptions Disp Refills   allopurinol (ZYLOPRIM) 300 MG tablet 90 tablet 4    Sig: Take 1 tablet (300 mg total) by mouth daily.      Endocrinology:  Gout Agents Failed - 12/01/2019  2:33 PM      Failed - Uric Acid in normal range and within 360 days    Uric Acid  Date Value Ref Range Status  09/19/2018 1.9 (L) 3.7 - 8.6 mg/dL Final    Comment:               Therapeutic target for gout patients: <6.0          Passed - Cr in normal range and within 360 days    Creatinine, Ser  Date Value Ref Range Status  04/21/2019 0.88 0.76 - 1.27 mg/dL Final   Creatinine, Urine  Date Value Ref Range Status  03/05/2019 82 mg/dL Final    Comment:    Performed at [facility], [address], [place],  [address]          Passed - Valid encounter within last 12 months    Recent Outpatient Visits           4 months ago Type 2 diabetes mellitus without complication, without long-term current use of insulin [facility])   [place], [place], [place]   7 months ago Diabetes mellitus associated with hormonal etiology [facility])   [place], [place], [place]   8 months ago Acute lower UTI   [facility], [place], [place]   1 year ago Type 2 diabetes mellitus without complication, without long-term current use of insulin ([place])   [place], Jeannette How, MD   1 year ago Essential (primary) hypertension   [facility] Johnson, Megan P, DO       Future Appointments             In 8 months [place], [facility]               amLODipine (NORVASC) 10 MG tablet 90 tablet 4    Sig: Take 1 tablet (10 mg total) by  mouth daily.      Cardiovascular:  Calcium Channel Blockers Failed - 12/01/2019  2:33 PM      Failed - Last BP in normal range    BP Readings from Last 1 Encounters:  07/21/19 (!) 156/72          Passed - Valid encounter within last 6 months    Recent Outpatient Visits           4 months ago Type 2 diabetes mellitus without complication, without long-term current use of insulin [facility])   [place], [place], [place]   7 months ago Diabetes mellitus associated with hormonal etiology [facility])   [facility] Merrie Roof [place], [place]   8 months ago Acute lower UTI   [facility] Luis M. [place], [place]   1 year ago Type 2 diabetes mellitus without complication, without long-term current use of insulin ([place])   [facility] Cr[place], Jeannette How, MD   1 year ago Essential (primary) hypertension   [place]  Valerie Roys, DO       Future Appointments             In 8 months [facility], [facility]               glipiZIDE (GLUCOTROL) 5 MG tablet 90 tablet 4    Sig: Take 1 tablet (5 mg total) by mouth daily before breakfast.      Endocrinology:  Diabetes - Sulfonylureas Failed - 12/01/2019  2:33 PM      Failed - HBA1C is between 0 and 7.9 and within 180 days    HB A1C (BAYER DCA - WAIVED)  Date Value Ref Range Status  09/19/2018 8.0 (H) <7.0 % Final    Comment:                                          Diabetic Adult            <7.0                                       Healthy Adult        4.3 - 5.7                                                           (DCCT/NGSP) American Diabetes Association's Summary of Glycemic Recommendations for Adults with Diabetes: Hemoglobin A1c <7.0%. More stringent glycemic goals (A1c <6.0%) may further reduce complications at the cost of increased risk of hypoglycemia.    Hgb A1c MFr Bld  Date Value Ref Range Status  07/21/2019 8.2 (H) 4.8 - 5.6 % Final     Comment:             Prediabetes: 5.7 - 6.4          Diabetes: >6.4          Glycemic control for adults with diabetes: <7.0           Passed - Valid encounter within last 6 months    Recent Outpatient Visits           4 months ago Type 2 diabetes mellitus without complication, without long-term current use of insulin [facility])   [place], [place], [place]   7 months ago Diabetes mellitus associated with hormonal etiology [facility])   [facility] Merrie Roof [place], [place]   8 months ago Acute lower UTI   [facility], [place], [place]   1 year ago Type 2 diabetes mellitus without complication, without long-term current use of insulin ([place])   [facility] Crissman, Jeannette How, MD   1 year ago Essential (primary) hypertension   [facility] Johnson, Megan P, DO       Future Appointments             In 8 months [place], [facility]               losartan (COZAAR) 100 MG tablet 90 tablet 4    Sig: Take 1 tablet (100 mg total) by mouth daily.  Cardiovascular:  Angiotensin Receptor Blockers Failed - 12/01/2019  2:33 PM      Failed - Cr in normal range and within 180 days    Creatinine, Ser  Date Value Ref Range Status  04/21/2019 0.88 0.76 - 1.27 mg/dL Final   Creatinine, Urine  Date Value Ref Range Status  03/05/2019 82 mg/dL Final    Comment:    Performed at [facility], [address] [place], [place] [address]          Failed - K in normal range and within 180 days    Potassium  Date Value Ref Range Status  04/21/2019 3.8 3.5 - 5.2 mmol/L Final          Failed - Last BP in normal range    BP Readings from Last 1 Encounters:  07/21/19 (!) 156/72          Passed - Patient is not pregnant      Passed - Valid encounter within last 6 months    Recent Outpatient Visits           4 months ago Type 2 diabetes mellitus without complication, without  long-term current use of insulin ([place])   [place], [place], [place]   7 months ago Diabetes mellitus associated with hormonal etiology [facility])   [facility] Merrie Roof [place], [place]   8 months ago Acute lower UTI   [facility], [place], [place]   1 year ago Type 2 diabetes mellitus without complication, without long-term current use of insulin ([place])   [facility] Criss[place], Jeannette How, MD   1 year ago Essential (primary) hypertension   [facility] Johnson, Megan P, DO       Future Appointments             In 8 months [place], [facility]               metFORMIN (GLUCOPHAGE) 500 MG tablet 360 tablet 4    Sig: Take 2 tablets (1,000 mg total) by mouth 2 (two) times daily with a meal.      Endocrinology:  Diabetes - Biguanides Failed - 12/01/2019  2:33 PM      Failed - HBA1C is between 0 and 7.9 and within 180 days    HB A1C (BAYER DCA - WAIVED)  Date Value Ref Range Status  09/19/2018 8.0 (H) <7.0 % Final    Comment:                                          Diabetic Adult            <7.0                                       Healthy Adult        4.3 - 5.7                                                           (DCCT/NGSP) American Diabetes Association's Summary of Glycemic Recommendations for Adults with Diabetes: Hemoglobin  A1c <7.0%. More stringent glycemic goals (A1c <6.0%) may further reduce complications at the cost of increased risk of hypoglycemia.    Hgb A1c MFr Bld  Date Value Ref Range Status  07/21/2019 8.2 (H) 4.8 - 5.6 % Final    Comment:             Prediabetes: 5.7 - 6.4          Diabetes: >6.4          Glycemic control for adults with diabetes: <7.0           Passed - Cr in normal range and within 360 days    Creatinine, Ser  Date Value Ref Range Status  04/21/2019 0.88 0.76 - 1.27 mg/dL Final   Creatinine, Urine  Date Value Ref Range Status   03/05/2019 82 mg/dL Final    Comment:    Performed at [facility], [address] [place], [place] [address]          Passed - eGFR in normal range and within 360 days    GFR calc Af Wyvonnia Lora  Date Value Ref Range Status  04/21/2019 97 >59 mL/min/1.73 Final   GFR calc non Af Amer  Date Value Ref Range Status  04/21/2019 84 >59 mL/min/1.73 Final          Passed - Valid encounter within last 6 months    Recent Outpatient Visits           4 months ago Type 2 diabetes mellitus without complication, without long-term current use of insulin ([place])   [place], [place], [place]   7 months ago Diabetes mellitus associated with hormonal etiology [facility]   [place], [place], [place]   8 months ago Acute lower UTI   [facility], [place], [place]   1 year ago Type 2 diabetes mellitus without complication, without long-term current use of insulin ([place])   [facility] Criss[place], Jeannette How, MD   1 year ago Essential (primary) hypertension   [facility] Johnson, Megan P, DO       Future Appointments             In 8 months [place], [facility]               rosuvastatin (CRESTOR) 40 MG tablet 90 tablet 4    Sig: Take 1 tablet (40 mg total) by mouth daily.      Cardiovascular:  Antilipid - Statins Failed - 12/01/2019  2:33 PM      Failed - LDL in normal range and within 360 days    LDL Chol Calc (NIH)  Date Value Ref Range Status  04/21/2019 52 0 - 99 mg/dL Final          Failed - HDL in normal range and within 360 days    HDL  Date Value Ref Range Status  04/21/2019 25 (L) >39 mg/dL Final          Passed - Total Cholesterol in normal range and within 360 days    Cholesterol, Total  Date Value Ref Range Status  04/21/2019 101 100 - 199 mg/dL Final   Cholesterol Piccolo, Waived  Date Value Ref Range Status  03/28/2018 122 <200 mg/dL Final    Comment:                             Desirable                <  200                         Borderline High      200- 239                         High                     >239           Passed - Triglycerides in normal range and within 360 days    Triglycerides  Date Value Ref Range Status  04/21/2019 135 0 - 149 mg/dL Final   Triglycerides Piccolo,Waived  Date Value Ref Range Status  03/28/2018 232 (H) <150 mg/dL Final    Comment:                            Normal                   <150                         Borderline High     150 - 199                         High                200 - 499                         Very High                >499           Passed - Patient is not pregnant      Passed - Valid encounter within last 12 months    Recent Outpatient Visits           4 months ago Type 2 diabetes mellitus without complication, without long-term current use of insulin [facility])   [place], [place], [place]   7 months ago Diabetes mellitus associated with hormonal etiology [facility])   [place], [place], [place]   8 months ago Acute lower UTI   [facility], [place], [place]   1 year ago Type 2 diabetes mellitus without complication, without long-term current use of insulin ([place])   [facility] Crissman, Jeannette How, MD   1 year ago Essential (primary) hypertension   [facility] Johnson, Megan P, DO       Future Appointments             In 8 months [facility], [facility]               Vitamin D, Ergocalciferol, (DRISDOL) 1.25 MG (50000 UNIT) CAPS capsule 12 capsule 1    Sig: Take 1 capsule (50,000 Units total) by mouth every 7 (seven) days.      Endocrinology:  Vitamins - Vitamin D Supplementation Failed - 12/01/2019  2:33 PM      Failed - 50,000 IU strengths are not delegated      Failed - Vitamin D in normal range and within 360 days    Vit D, 25-Hydroxy  Date Value  Ref Range Status  07/21/2019 14.7 (L) 30.0 - 100.0 ng/mL Final    Comment:  Vitamin D deficiency has been defined by the [place] practice guideline as a level of serum 25-OH vitamin D less than 20 ng/mL (1,2). The Endocrine Society went on to further define vitamin D insufficiency as a level between 21 and 29 ng/mL (2). 1. IOM (Institute of Medicine). 2010. Dietary reference    intakes for calcium and D. [place]: The    [employer]. 2. Holick MF, Binkley [place], Bischoff-Ferrari HA, et al.    Evaluation, treatment, and prevention of vitamin D    deficiency: an Endocrine Society clinical practice    guideline. JCEM. 2011 Jul; 96(7):1911-30.           Passed - Ca in normal range and within 360 days    Calcium  Date Value Ref Range Status  04/21/2019 9.7 8.6 - 10.2 mg/dL Final          Passed - Phosphate in normal range and within 360 days    Phosphorus  Date Value Ref Range Status  03/11/2019 2.7 2.5 - 4.6 mg/dL Final          Passed - Valid encounter within last 12 months    Recent Outpatient Visits           4 months ago Type 2 diabetes mellitus without complication, without long-term current use of insulin ([place])   [place], [place], [place]   7 months ago Diabetes mellitus associated with hormonal etiology [facility])   [place], [place], [place]   8 months ago Acute lower UTI   [facility], [place], [place]   1 year ago Type 2 diabetes mellitus without complication, without long-term current use of insulin ([place])   [place], Jeannette How, MD   1 year ago Essential (primary) hypertension   [place], Megan P, DO       Future Appointments             In 8 months [place], [facility]               meloxicam (MOBIC) 15 MG tablet        Analgesics:  COX2 Inhibitors Passed - 12/01/2019   2:33 PM      Passed - HGB in normal range and within 360 days    Hemoglobin  Date Value Ref Range Status  07/21/2019 15.3 13.0 - 17.7 g/dL Final          Passed - Cr in normal range and within 360 days    Creatinine, Ser  Date Value Ref Range Status  04/21/2019 0.88 0.76 - 1.27 mg/dL Final   Creatinine, Urine  Date Value Ref Range Status  03/05/2019 82 mg/dL Final    Comment:    Performed at [facility], [address], [place], [place] [address]          [place] - Patient is not pregnant      Passed - Valid encounter within last 12 months    Recent Outpatient Visits           4 months ago Type 2 diabetes mellitus without complication, without long-term current use of insulin [facility])   [place], [place], [place]   7 months ago Diabetes mellitus associated with hormonal etiology [facility])   [place], [place], [place]   8 months ago Acute lower UTI   [facility] [place], Apolonio Schneiders  Benjamine Mola, PA-C   1 year ago Type 2 diabetes mellitus without complication, without long-term current use of insulin ([place])   [place], Jeannette How, MD   1 year ago Essential (primary) hypertension   [place], Tat[place], DO       Future Appointments             In 8 months [place], [facility]              Signed Prescriptions Disp Refills   ACCU-CHEK AVIVA PLUS test strip 100 each 12    Sig: Check 1 time a day      Endocrinology: Diabetes - Testing Supplies Passed - 12/01/2019  2:33 PM      Passed - Valid encounter within last 12 months    Recent Outpatient Visits           4 months ago Type 2 diabetes mellitus without complication, without long-term current use of insulin [facility])   [place], [place], [place]   7 months ago Diabetes mellitus associated with hormonal etiology [facility])   [place], [place], [place]   8  months ago Acute lower UTI   [facility], [place], [place]   1 year ago Type 2 diabetes mellitus without complication, without long-term current use of insulin ([place])   [facility] Crissman, Jeannette How, MD   1 year ago Essential (primary) hypertension   [place], Megan P, DO       Future Appointments             In 8 months [place], [facility]               pantoprazole (PROTONIX) 40 MG tablet 180 tablet 3    Sig: TAKE 1 TABLET 2 TIMES DAILY BEFORE A MEAL.      Gastroenterology: Proton Pump Inhibitors Passed - 12/01/2019  2:33 PM      Passed - Valid encounter within last 12 months    Recent Outpatient Visits           4 months ago Type 2 diabetes mellitus without complication, without long-term current use of insulin [facility])   [facility] Merrie Roof [place], [place]   7 months ago Diabetes mellitus associated with hormonal etiology [facility])   [place], [place], [place]   8 months ago Acute lower UTI   [facility] [place], [place]   1 year ago Type 2 diabetes mellitus without complication, without long-term current use of insulin ([place])   [facility] Criss[place], Jeannette How, MD   1 year ago Essential (primary) hypertension   [place], Megan P, DO       Future Appointments             In 8 months [place], [facility]

## 2019-12-01 NOTE — Telephone Encounter (Signed)
Medication Refill - Medication: ACCU-CHEK AVIVA PLUS test strip ,allopurinol (ZYLOPRIM) 300 MG tablet, amLODipine (NORVASC) 10 MG tablet ,glipiZIDE (GLUCOTROL) 5 MG tablet,losartan (COZAAR) 100 MG tablet ,meloxicam (MOBIC) 15 MG tablet,pantoprazole (PROTONIX) 40 MG tablet ,rosuvastatin (CRESTOR) 40 MG tablet,Vitamin D, Ergocalciferol, (DRISDOL) 1.25 MG (50000 UNIT) CAPS capsule   Has the patient contacted their pharmacy?yes (Agent: If no, request that the patient contact the pharmacy for the refill.) (Agent: If yes, when and what did the pharmacy advise?)contact pcp  Preferred Pharmacy (with phone number or street name):  [facility] [place], [place] - [address] Phone:  [phone]  Fax:  [phone]       Agent: Please be advised that RX refills may take up to 3 business days. We ask that you follow-up with your pharmacy.

## 2019-12-01 NOTE — Telephone Encounter (Signed)
Patient is overdue for a follow up. Please schedule and then route to provider for refills.

## 2019-12-02 NOTE — Telephone Encounter (Signed)
Unable to lvm due to VM not set up to make this apt.

## 2019-12-03 NOTE — Telephone Encounter (Signed)
Pt has apt on 12/05/2019 at 8:40am

## 2019-12-04 MED ORDER — MELOXICAM 15 MG PO TABS
15.0000 mg | ORAL_TABLET | Freq: Every day | ORAL | 1 refills | Status: DC
Start: 2019-12-04 — End: 2020-03-18

## 2019-12-04 MED ORDER — GLIPIZIDE 5 MG PO TABS: 5.0000 mg | ORAL_TABLET | Freq: Every day | ORAL | 0 refills | Status: DC

## 2019-12-04 MED ORDER — LOSARTAN POTASSIUM 100 MG PO TABS: 100.0000 mg | ORAL_TABLET | Freq: Every day | ORAL | 0 refills | Status: DC

## 2019-12-04 MED ORDER — AMLODIPINE BESYLATE 10 MG PO TABS: 10.0000 mg | ORAL_TABLET | Freq: Every day | ORAL | 0 refills | Status: DC

## 2019-12-04 MED ORDER — VITAMIN D (ERGOCALCIFEROL) 1.25 MG (50000 UNIT) PO CAPS
50000.0000 [IU] | ORAL_CAPSULE | ORAL | 1 refills | Status: DC
Start: 2019-12-04 — End: 2020-09-09

## 2019-12-04 MED ORDER — METFORMIN HCL 500 MG PO TABS: 1000.0000 mg | ORAL_TABLET | Freq: Two times a day (BID) | ORAL | 0 refills | Status: DC

## 2019-12-04 MED ORDER — ROSUVASTATIN CALCIUM 40 MG PO TABS: 40.0000 mg | ORAL_TABLET | Freq: Every day | ORAL | 0 refills | Status: DC

## 2019-12-04 MED ORDER — ALLOPURINOL 300 MG PO TABS: 300.0000 mg | ORAL_TABLET | Freq: Every day | ORAL | 0 refills | Status: DC

## 2019-12-05 ENCOUNTER — Ambulatory Visit: Payer: Medicare HMO | Admitting: Family Medicine

## 2019-12-12 ENCOUNTER — Encounter: Payer: Self-pay | Admitting: Nurse Practitioner

## 2019-12-12 DIAGNOSIS — K219 Gastro-esophageal reflux disease without esophagitis: Secondary | ICD-10-CM | POA: Insufficient documentation

## 2019-12-18 ENCOUNTER — Encounter: Payer: Self-pay | Admitting: Nurse Practitioner

## 2019-12-18 ENCOUNTER — Other Ambulatory Visit: Payer: Self-pay

## 2019-12-18 ENCOUNTER — Ambulatory Visit (INDEPENDENT_AMBULATORY_CARE_PROVIDER_SITE_OTHER): Payer: Medicare HMO | Admitting: Nurse Practitioner

## 2019-12-18 VITALS — BP 122/60 | HR 65 | Temp 98.2°F | Wt 162.0 lb

## 2019-12-18 DIAGNOSIS — R32 Unspecified urinary incontinence: Secondary | ICD-10-CM

## 2019-12-18 DIAGNOSIS — E559 Vitamin D deficiency, unspecified: Secondary | ICD-10-CM | POA: Diagnosis not present

## 2019-12-18 DIAGNOSIS — Z23 Encounter for immunization: Secondary | ICD-10-CM

## 2019-12-18 DIAGNOSIS — E785 Hyperlipidemia, unspecified: Secondary | ICD-10-CM | POA: Diagnosis not present

## 2019-12-18 DIAGNOSIS — K219 Gastro-esophageal reflux disease without esophagitis: Secondary | ICD-10-CM | POA: Diagnosis not present

## 2019-12-18 DIAGNOSIS — E1159 Type 2 diabetes mellitus with other circulatory complications: Secondary | ICD-10-CM

## 2019-12-18 DIAGNOSIS — Z87891 Personal history of nicotine dependence: Secondary | ICD-10-CM | POA: Diagnosis not present

## 2019-12-18 DIAGNOSIS — I10 Essential (primary) hypertension: Secondary | ICD-10-CM | POA: Diagnosis not present

## 2019-12-18 DIAGNOSIS — E538 Deficiency of other specified B group vitamins: Secondary | ICD-10-CM

## 2019-12-18 DIAGNOSIS — E1169 Type 2 diabetes mellitus with other specified complication: Secondary | ICD-10-CM | POA: Diagnosis not present

## 2019-12-18 DIAGNOSIS — I152 Hypertension secondary to endocrine disorders: Secondary | ICD-10-CM

## 2019-12-18 MED ORDER — TAMSULOSIN HCL 0.4 MG PO CAPS: 0.4000 mg | ORAL_CAPSULE | Freq: Every day | ORAL | 4 refills | Status: DC

## 2019-12-18 NOTE — Assessment & Plan Note (Signed)
New onset over past month, refuses prostate exam today.  Urgency vs BPH, UA did note trace BLD and 1+ LEUK -- will recheck next visit.  Start Flomax QHS and have return in 4 weeks to see if benefit and recheck urine.  If ongoing blood in urine will consider urology referral due to symptoms and history of heavy smoking.

## 2019-12-18 NOTE — Patient Instructions (Signed)

## 2019-12-18 NOTE — Assessment & Plan Note (Signed)
Chronic, ongoing with BP at goal today on recheck and at goal on home readings.  Continue current medication regimen and adjust as needed.  Losartan offering kidney protection with diabetes.  Recommend he continue to monitor BP at home a few days a week and document for provider + focus on DASH diet.  BMP and TSH today.  Return in 3 months.

## 2019-12-18 NOTE — Assessment & Plan Note (Signed)
Chronic, ongoing.  Continue current medication regimen and adjust as needed. Lipid panel today. 

## 2019-12-18 NOTE — Assessment & Plan Note (Signed)
Noted on recent labs with level 236, recommend supplement daily -- recheck today.

## 2019-12-18 NOTE — Progress Notes (Signed)
BP 122/60 (BP Location: Left Arm)   Pulse 65   Temp 98.2 F (36.8 C) (Oral)   Wt 162 lb (73.5 kg)   SpO2 97%   BMI 28.70 kg/m    Subjective:    Patient ID: Carl Stalled R Fullman, male    DOB: 09/18/1945, 74 y.o.   MRN: [id number]  HPI: Carl Bryan is a 74 y.o. male  Chief Complaint  Patient presents with  . Diabetes  . Hypertension  . Urinary Incontinence    x about a  month now   DIABETES Continues on Metformin and Glipizide with last A1C 8.2% in April.  In January his CRT 0.88 and GFR 84, NA+ 132.  Reports he was out of pills for 3 weeks and just got refill last week.  Last labs noted B12 level 236, not taking supplement and Vit D level 17.4 -- taking supplement. Hypoglycemic episodes:no Polydipsia/polyuria: no Visual disturbance: no Chest pain: no Paresthesias: no Glucose Monitoring: yes  Accucheck frequency: Daily  Fasting glucose: 126 range he reports  Post prandial:  Evening:  Before meals: Taking Insulin?: no  Long acting insulin:  Short acting insulin: Blood Pressure Monitoring: a few times a week Retinal Examination: Up to Date Foot Exam: Up to Date Pneumovax: Up to Date Influenza: Up to Date Aspirin: no   HYPERTENSION / HYPERLIPIDEMIA Continues on Amlodipine, Losartan, and Crestor daily. Satisfied with current treatment? yes Duration of hypertension: chronic BP monitoring frequency: a few times a week BP range: 120/70 range at home BP medication side effects: no Duration of hyperlipidemia: chronic Cholesterol medication side effects: no Cholesterol supplements: none Medication compliance: good compliance Aspirin: no Recent stressors: no Recurrent headaches: no Visual changes: no Palpitations: no Dyspnea: no Chest pain: no Lower extremity edema: no Dizzy/lightheaded: no   URINARY URGENCY/INCONTINENCE No current medications for BPH.  Has had incontinence issues for about one month -- urgency issues.  When "I have to go I have to go".  No  incontinence of bowels.  No history of prostate CA.  Tries not to drink fluids after 4 pm.  Has quit drinking tea as this causes worse symptoms.    Has history of smoking, smoked 4 PPD at one time -- started around age 617-8 and stopped at around age 74.   Duration: chronic Nocturia: 4-5x per night Urinary frequency:yes Incomplete voiding: no Urgency: yes Weak urinary stream: no Straining to start stream: no Dysuria: no Onset: sudden Severity: moderate Alleviating factors: cutting back on tea and not drinking after 4 pm Aggravating factors: unknown Treatments attempted: none IPSS Questionnaire (AUA-7): Over the past month.   1)  How often have you had a sensation of not emptying your bladder completely after you finish urinating?  0 - Not at all  2)  How often have you had to urinate again less than two hours after you finished urinating? 5 - Almost always  3)  How often have you found you stopped and started again several times when you urinated?  0 - Not at all  4) How difficult have you found it to postpone urination?  5 - Almost always  5) How often have you had a weak urinary stream?  0 - Not at all  6) How often have you had to push or strain to begin urination?  0 - Not at all  7) How many times did you most typically get up to urinate from the time you went to bed until the time you got  up in the morning?  4 - 4 times  Total score:  0-7 mildly symptomatic   8-19 moderately symptomatic   20-35 severely symptomatic   Relevant past medical, surgical, family and social history reviewed and updated as indicated. Interim medical history since our last visit reviewed. Allergies and medications reviewed and updated.  Review of Systems  Constitutional: Negative for activity change, diaphoresis, fatigue and fever.  Respiratory: Negative for cough, chest tightness, shortness of breath and wheezing.   Cardiovascular: Negative for chest pain, palpitations and leg swelling.    Gastrointestinal: Negative.   Endocrine: Negative for cold intolerance, heat intolerance, polydipsia, polyphagia and polyuria.  Neurological: Negative.   Psychiatric/Behavioral: Negative.     Per HPI unless specifically indicated above     Objective:    BP 122/60 (BP Location: Left Arm)   Pulse 65   Temp 98.2 F (36.8 C) (Oral)   Wt 162 lb (73.5 kg)   SpO2 97%   BMI 28.70 kg/m   Wt Readings from Last 3 Encounters:  12/18/19 162 lb (73.5 kg)  07/21/19 162 lb (73.5 kg)  04/21/19 155 lb (70.3 kg)    Physical Exam Vitals and nursing note reviewed.  Constitutional:      General: He is awake.     Appearance: He is well-developed, well-groomed and overweight.  HENT:     Head: Normocephalic and atraumatic.     Right Ear: Hearing normal. No drainage.     Left Ear: Hearing normal. No drainage.     Mouth/Throat:     Pharynx: Uvula midline.  Eyes:     General: Lids are normal.        Right eye: No discharge.        Left eye: No discharge.     Conjunctiva/sclera: Conjunctivae normal.     Pupils: Pupils are equal, round, and reactive to light.  Neck:     Thyroid: No thyromegaly.     Vascular: No carotid bruit or JVD.     Trachea: Trachea normal.  Cardiovascular:     Rate and Rhythm: Normal rate and regular rhythm.     Heart sounds: Normal heart sounds, S1 normal and S2 normal. No murmur heard.  No gallop.   Pulmonary:     Effort: Pulmonary effort is normal.     Breath sounds: Normal breath sounds.  Abdominal:     General: Bowel sounds are normal.     Palpations: Abdomen is soft. There is no hepatomegaly or splenomegaly.  Musculoskeletal:        General: Normal range of motion.     Cervical back: Normal range of motion and neck supple.     Right lower leg: No edema.     Left lower leg: No edema.  Skin:    General: Skin is warm and dry.     Capillary Refill: Capillary refill takes less than 2 seconds.  Neurological:     Mental Status: He is alert and oriented to  person, place, and time.     Deep Tendon Reflexes: Reflexes are normal and symmetric.  Psychiatric:        Attention and Perception: Attention normal.        Mood and Affect: Mood normal.        Speech: Speech normal.        Behavior: Behavior normal. Behavior is cooperative.        Thought Content: Thought content normal.    Diabetic Foot Exam - Simple   Simple Foot  Form Visual Inspection No deformities, no ulcerations, no other skin breakdown bilaterally: Yes Sensation Testing Intact to touch and monofilament testing bilaterally: Yes Pulse Check Posterior Tibialis and Dorsalis pulse intact bilaterally: Yes Comments Right above knee amputation present.    Results for orders placed or performed in visit on 07/21/19  HgB A1c  Result Value Ref Range   Hgb A1c MFr Bld 8.2 (H) 4.8 - 5.6 %   Est. average glucose Bld gHb Est-mCnc 189 mg/dL  CBC with Differential/Platelet  Result Value Ref Range   WBC 10.2 3.4 - 10.8 x10E3/uL   RBC 5.06 4.14 - 5.80 x10E6/uL   Hemoglobin 15.3 13.0 - 17.7 g/dL   Hematocrit 94.8 54.6 - 51.0 %   MCV 88 79 - 97 fL   MCH 30.2 26.6 - 33.0 pg   MCHC 34.3 31 - 35 g/dL   RDW 27.0 35.0 - 09.3 %   Platelets 247 150 - 450 x10E3/uL   Neutrophils 72 Not Estab. %   Lymphs 18 Not Estab. %   Monocytes 6 Not Estab. %   Eos 2 Not Estab. %   Basos 1 Not Estab. %   Neutrophils Absolute 7.5 (H) 1 - 7 x10E3/uL   Lymphocytes Absolute 1.8 0 - 3 x10E3/uL   Monocytes Absolute 0.6 0 - 0 x10E3/uL   EOS (ABSOLUTE) 0.2 0.0 - 0.4 x10E3/uL   Basophils Absolute 0.1 0 - 0 x10E3/uL   Immature Granulocytes 1 Not Estab. %   Immature Grans (Abs) 0.1 0.0 - 0.1 x10E3/uL  TSH  Result Value Ref Range   TSH 2.190 0.450 - 4.500 uIU/mL  VITAMIN D 25 Hydroxy (Vit-D Deficiency, Fractures)  Result Value Ref Range   Vit D, 25-Hydroxy 14.7 (L) 30.0 - 100.0 ng/mL  Vitamin B12  Result Value Ref Range   Vitamin B-12 236 232 - 1,245 pg/mL      Assessment & Plan:   Problem List Items  Addressed This Visit      Cardiovascular and Mediastinum   Hypertension associated with diabetes (HCC)    Chronic, ongoing with BP at goal today on recheck and at goal on home readings.  Continue current medication regimen and adjust as needed.  Losartan offering kidney protection with diabetes.  Recommend he continue to monitor BP at home a few days a week and document for provider + focus on DASH diet.  BMP and TSH today.  Return in 3 months.       Relevant Orders   Basic metabolic panel   Bayer DCA Hb G1W Waived   Microalbumin, Urine Waived   TSH     Digestive   GERD (gastroesophageal reflux disease)   Relevant Orders   Magnesium     Endocrine   Hyperlipidemia associated with type 2 diabetes mellitus (HCC)    Chronic, ongoing.  Continue current medication regimen and adjust as needed.  Lipid panel today.         Relevant Orders   Bayer DCA Hb A1c Waived   Lipid Panel w/o Chol/HDL Ratio   Diabetes mellitus associated with hormonal etiology (HCC) - Primary    Chronic, ongoing.  A1C downward trend today to 7.3%.  At this time will continue current medication regimen and adjust next visit if remains >7%.  Recommend he continue to monitor BS at home daily and document for visits + focus on diabetic diet.  Could consider GLP in future, which may allow for reduction to discontinuation of Glipizide.  Urine ALB 80 today, continue Losartan  for kidney protection.  Return in 3 months.      Relevant Orders   Bayer DCA Hb A1c Waived     Other   Vitamin D deficiency    Ongoing, 17.4 on last visit.  Continue current supplement and recheck level today.      Relevant Orders   VITAMIN D 25 Hydroxy (Vit-D Deficiency, Fractures)   B12 deficiency    Noted on recent labs with level 236, recommend supplement daily -- recheck today.      Relevant Orders   Vitamin B12   Urinary incontinence    New onset over past month, refuses prostate exam today.  Urgency vs BPH, UA did note trace BLD and  1+ LEUK -- will recheck next visit.  Start Flomax QHS and have return in 4 weeks to see if benefit and recheck urine.  If ongoing blood in urine will consider urology referral due to symptoms and history of heavy smoking.        Relevant Medications   tamsulosin (FLOMAX) 0.4 MG CAPS capsule   Other Relevant Orders   UA/M w/rflx Culture, Routine   PSA   History of smoking    Recommend continued cessation.         Other Visit Diagnoses    Flu vaccine need       Relevant Orders   Flu Vaccine QUAD High Dose(Fluad) (Completed)       Follow up plan: Return in about 4 weeks (around 01/15/2020) for BPH.

## 2019-12-18 NOTE — Assessment & Plan Note (Signed)
Recommend continued cessation.  

## 2019-12-18 NOTE — Assessment & Plan Note (Signed)
Chronic, ongoing.  A1C downward trend today to 7.3%.  At this time will continue current medication regimen and adjust next visit if remains >7%.  Recommend he continue to monitor BS at home daily and document for visits + focus on diabetic diet.  Could consider GLP in future, which may allow for reduction to discontinuation of Glipizide.  Urine ALB 80 today, continue Losartan for kidney protection.  Return in 3 months.

## 2019-12-18 NOTE — Assessment & Plan Note (Signed)
Ongoing, 17.4 on last visit.  Continue current supplement and recheck level today.

## 2019-12-19 ENCOUNTER — Other Ambulatory Visit: Payer: Self-pay | Admitting: Nurse Practitioner

## 2019-12-19 LAB — LIPID PANEL W/O CHOL/HDL RATIO
Cholesterol, Total: 143 mg/dL (ref 100–199)
HDL: 40 mg/dL (ref >39)
LDL Chol Calc (NIH): 77 mg/dL (ref 0–99)
Triglycerides: 150 mg/dL — ABNORMAL HIGH (ref 0–149)
VLDL Cholesterol Cal: 26 mg/dL (ref 5–40)

## 2019-12-19 LAB — BASIC METABOLIC PANEL
BUN/Creatinine Ratio: 22 (ref 10–24)
BUN: 19 mg/dL (ref 8–27)
CO2: 20 mmol/L (ref 20–29)
Calcium: 9.9 mg/dL (ref 8.6–10.2)
Chloride: 102 mmol/L (ref 96–106)
Creatinine, Ser: 0.86 mg/dL (ref 0.76–1.27)
GFR calc Af Amer: 98 mL/min/{1.73_m2} (ref >59)
GFR calc non Af Amer: 85 mL/min/{1.73_m2} (ref >59)
Glucose: 179 mg/dL — ABNORMAL HIGH (ref 65–99)
Potassium: 4.4 mmol/L (ref 3.5–5.2)
Sodium: 137 mmol/L (ref 134–144)

## 2019-12-19 LAB — VITAMIN D 25 HYDROXY (VIT D DEFICIENCY, FRACTURES): Vit D, 25-Hydroxy: 49.9 ng/mL (ref 30.0–100.0)

## 2019-12-19 LAB — PSA: Prostate Specific Ag, Serum: 1.8 ng/mL (ref 0.0–4.0)

## 2019-12-19 LAB — VITAMIN B12: Vitamin B-12: 220 pg/mL — ABNORMAL LOW (ref 232–1245)

## 2019-12-19 LAB — TSH: TSH: 2.47 u[IU]/mL (ref 0.450–4.500)

## 2019-12-19 LAB — MAGNESIUM: Magnesium: 1.7 mg/dL (ref 1.6–2.3)

## 2019-12-19 MED ORDER — VITAMIN B-12 1000 MCG PO TABS: 1000.0000 ug | ORAL_TABLET | Freq: Every day | ORAL | 4 refills | Status: DC

## 2019-12-19 NOTE — Progress Notes (Signed)
Please let Carl Bryan know his labs have returned and overall look good, including prostate labs.  B12 level is on low side, which we see at times with long term Metformin use.  I am going to send in some Vitamin B12 for him to take daily, this is very good for nervous system health -- including can decrease nerve pain and fatigue.  Vitamin D level has improved, I recommend continuing daily Vitamin D3 1000 units.  If any questions let me know.  Have a great day.

## 2019-12-24 ENCOUNTER — Other Ambulatory Visit: Payer: Self-pay | Admitting: Nurse Practitioner

## 2019-12-24 LAB — UA/M W/RFLX CULTURE, ROUTINE
Bilirubin, UA: NEGATIVE
Glucose, UA: NEGATIVE
Ketones, UA: NEGATIVE
Nitrite, UA: NEGATIVE
Specific Gravity, UA: <1.005 — ABNORMAL LOW (ref 1.005–1.030)
Urobilinogen, Ur: 0.2 mg/dL (ref 0.2–1.0)
pH, UA: 6 (ref 5.0–7.5)

## 2019-12-24 LAB — MICROALBUMIN, URINE WAIVED
Creatinine, Urine Waived: 50 mg/dL (ref 10–300)
Microalb, Ur Waived: 80 mg/L — ABNORMAL HIGH (ref 0–19)
Microalb/Creat Ratio: >300 mg/g — ABNORMAL HIGH (ref <30)

## 2019-12-24 LAB — URINE CULTURE, REFLEX

## 2019-12-24 LAB — MICROSCOPIC EXAMINATION: Bacteria, UA: NONE SEEN

## 2019-12-24 LAB — BAYER DCA HB A1C WAIVED: HB A1C (BAYER DCA - WAIVED): 7.3 % — ABNORMAL HIGH (ref <7.0)

## 2019-12-24 MED ORDER — SULFAMETHOXAZOLE-TRIMETHOPRIM 800-160 MG PO TABS: 1.0000 | ORAL_TABLET | Freq: Two times a day (BID) | ORAL | 0 refills | Status: AC

## 2020-01-02 ENCOUNTER — Telehealth: Payer: Self-pay | Admitting: General Practice

## 2020-01-02 ENCOUNTER — Telehealth: Payer: Self-pay

## 2020-01-02 NOTE — Telephone Encounter (Signed)
  Chronic Care Management   Outreach Note  01/02/2020 Name: EMERSYN KOTARSKI MRN: [id number] DOB: November 07, 1945  Referred by: Particia Nearing, PA-C Reason for referral : Chronic Care Management (RNCM Follow up call for Chronic Disease Managemetn and Care Coordination Needs)   An unsuccessful telephone outreach was attempted today. The patient was referred to the case management team for assistance with care management and care coordination.   Follow Up Plan: The care management team will reach out to the patient again over the next 30 to 60 days.   Alto Denver RN, MSN, CCM Community Care Coordinator [place]  [facility] Mobile: [phone]

## 2020-01-07 NOTE — Telephone Encounter (Signed)
Carl Bryan  Spoke with Pt to r/s f/u. Pt states  that he is in need of something to check his sugar with states that the office was to send him something and he never received it. I wasn't sure if this was something that you could f/u on   Thank you   [employer]

## 2020-01-11 ENCOUNTER — Encounter: Payer: Self-pay | Admitting: Nurse Practitioner

## 2020-01-11 DIAGNOSIS — I700 Atherosclerosis of aorta: Secondary | ICD-10-CM | POA: Insufficient documentation

## 2020-01-15 ENCOUNTER — Ambulatory Visit (INDEPENDENT_AMBULATORY_CARE_PROVIDER_SITE_OTHER): Payer: Medicare HMO | Admitting: Nurse Practitioner

## 2020-01-15 ENCOUNTER — Other Ambulatory Visit: Payer: Self-pay

## 2020-01-15 ENCOUNTER — Encounter: Payer: Self-pay | Admitting: Nurse Practitioner

## 2020-01-15 VITALS — BP 128/62 | HR 60 | Temp 98.2°F | Wt 162.0 lb

## 2020-01-15 DIAGNOSIS — R319 Hematuria, unspecified: Secondary | ICD-10-CM | POA: Insufficient documentation

## 2020-01-15 DIAGNOSIS — I700 Atherosclerosis of aorta: Secondary | ICD-10-CM

## 2020-01-15 DIAGNOSIS — R351 Nocturia: Secondary | ICD-10-CM | POA: Diagnosis not present

## 2020-01-15 DIAGNOSIS — N401 Enlarged prostate with lower urinary tract symptoms: Secondary | ICD-10-CM | POA: Insufficient documentation

## 2020-01-15 DIAGNOSIS — R32 Unspecified urinary incontinence: Secondary | ICD-10-CM

## 2020-01-15 DIAGNOSIS — R3121 Asymptomatic microscopic hematuria: Secondary | ICD-10-CM | POA: Diagnosis not present

## 2020-01-15 DIAGNOSIS — E538 Deficiency of other specified B group vitamins: Secondary | ICD-10-CM

## 2020-01-15 LAB — UA/M W/RFLX CULTURE, ROUTINE
Bilirubin, UA: NEGATIVE
Glucose, UA: NEGATIVE
Ketones, UA: NEGATIVE
Leukocytes,UA: NEGATIVE
Nitrite, UA: NEGATIVE
Specific Gravity, UA: 1.015 (ref 1.005–1.030)
Urobilinogen, Ur: 0.2 mg/dL (ref 0.2–1.0)
pH, UA: 5.5 (ref 5.0–7.5)

## 2020-01-15 LAB — MICROSCOPIC EXAMINATION
Bacteria, UA: NONE SEEN
WBC, UA: NONE SEEN /hpf (ref 0–5)

## 2020-01-15 MED ORDER — BLOOD GLUCOSE MONITOR KIT: PACK | 0 refills | Status: DC

## 2020-01-15 NOTE — Assessment & Plan Note (Signed)
Ongoing, refuses prostate exam today -- did have moderate enlargement 03/08/19.  Urgency vs BPH, UA did note trace BLD again today. Will continue Flomax at this time, as is benefiting nocturia some.  However, still having urgency symptoms and continues to have trace blood in urine, risk factor of past smoking.  Will place urology referral.  Return in 2 months.

## 2020-01-15 NOTE — Assessment & Plan Note (Addendum)
Ongoing with enlargement noted on CT scan 03/08/19.  Will continue Flomax at this time, as is benefiting nocturia some.  However, still having urgency symptoms and continues to have trace blood in urine, risk factor of past smoking.  Will place urology referral.  Return in 2 months.

## 2020-01-15 NOTE — Assessment & Plan Note (Signed)
Noted on scan 03/08/2019.  Continue statin daily and Baby ASA.  Recommend continued cessation of smoking.

## 2020-01-15 NOTE — Patient Instructions (Signed)
Urinary Frequency, Adult Urinary frequency means urinating more often than usual. You may urinate every 1-2 hours even though you drink a normal amount of fluid and do not have a bladder infection or condition. Although you urinate more often than normal, the total amount of urine produced in a day is normal. With urinary frequency, you may have an urgent need to urinate often. The stress and anxiety of needing to find a bathroom quickly can make this urge worse. This condition may go away on its own or you may need treatment at home. Home treatment may include bladder training, exercises, taking medicines, or making changes to your diet. Follow these instructions at home: Bladder health   Keep a bladder diary if told by your health care provider. Keep track of: ? What you eat and drink. ? How often you urinate. ? How much you urinate.  Follow a bladder training program if told by your health care provider. This may include: ? Learning to delay going to the bathroom. ? Double urinating (voiding). This helps if you are not completely emptying your bladder. ? Scheduled voiding.  Do Kegel exercises as told by your health care provider. Kegel exercises strengthen the muscles that help control urination, which may help the condition. Eating and drinking  If told by your health care provider, make diet changes, such as: ? Avoiding caffeine. ? Drinking fewer fluids, especially alcohol. ? Not drinking in the evening. ? Avoiding foods or drinks that may irritate the bladder. These include coffee, tea, soda, artificial sweeteners, citrus, tomato-based foods, and chocolate. ? Eating foods that help prevent or ease constipation. Constipation can make this condition worse. Your health care provider may recommend that you:  Drink enough fluid to keep your urine pale yellow.  Take over-the-counter or prescription medicines.  Eat foods that are high in fiber, such as beans, whole grains, and fresh  fruits and vegetables.  Limit foods that are high in fat and processed sugars, such as fried or sweet foods. General instructions  Take over-the-counter and prescription medicines only as told by your health care provider.  Keep all follow-up visits as told by your health care provider. This is important. Contact a health care provider if:  You start urinating more often.  You feel pain or irritation when you urinate.  You notice blood in your urine.  Your urine looks cloudy.  You develop a fever.  You begin vomiting. Get help right away if:  You are unable to urinate. Summary  Urinary frequency means urinating more often than usual. With urinary frequency, you may urinate every 1-2 hours even though you drink a normal amount of fluid and do not have a bladder infection or other bladder condition.  Your health care provider may recommend that you keep a bladder diary, follow a bladder training program, or make dietary changes.  If told by your health care provider, do Kegel exercises to strengthen the muscles that help control urination.  Take over-the-counter and prescription medicines only as told by your health care provider.  Contact a health care provider if your symptoms do not improve or get worse. This information is not intended to replace advice given to you by your health care provider. Make sure you discuss any questions you have with your health care provider. Document Revised: 10/04/2017 Document Reviewed: 10/04/2017 Elsevier Patient Education  2020 [employer]  

## 2020-01-15 NOTE — Assessment & Plan Note (Signed)
Ongoing, continue supplement daily -- recheck today.

## 2020-01-15 NOTE — Progress Notes (Signed)
BP 128/62    Pulse 60    Temp 98.2 F (36.8 C) (Oral)    Wt 162 lb (73.5 kg)    SpO2 96%    BMI 28.70 kg/m    Subjective:    Patient ID: ARDIAN HABERLAND, male    DOB: 24-Nov-1945, 74 y.o.   MRN: [id number]  HPI: LEVEON PELZER is a 74 y.o. male  Chief Complaint  Patient presents with   Follow-up    incontinence   BPH Started on Flomax at visit 12/18/19. Has slowed down urination at night a little since starting Flomax, but continues to have urgency.   Has had incontinence issues for about two months -- urgency issues.  When "I have to go I have to go".  No incontinence of bowels.  No history of prostate CA.  Tries not to drink fluids after 4 pm. Has quit drinking tea as this causes worse symptoms.  PSA on 12/18/19 was 1.9, is diabetic and last A1C 7.3%.  Trace blood noted on past labs and noted today, discussed with him -- no current ASA or blood thinners.  Has history of smoking, smoked 4 PPD at one time -- started around age 72-8 and stopped at around age 23.  Currently taking B12 for deficiency.  Did have CT renal study on 03/08/19 which noted moderate enlargement of prostate + bilateral inguinal hernias + aortic atherosclerosis. BPH status: uncontrolled Satisfied with current treatment?: no Medication side effects: no Medication compliance: good compliance Duration: months Nocturia: 4-5x per night -- varies last night got up 3 times Urinary frequency:yes Incomplete voiding: no Urgency: yes Weak urinary stream: no Straining to start stream: no Dysuria: no Onset: gradual Severity: moderate Alleviating factors: Flomax and tea reduction Aggravating factors: unknown Treatments attempted: Flomax  IPSS    Row Name 01/15/20 3500         International Prostate Symptom Score   How often have you had the sensation of not emptying your bladder? About half the time     How often have you had to urinate less than every two hours? Less than 1 in 5 times     How often have you found you stopped  and started again several times when you urinated? Less than 1 in 5 times     How often have you found it difficult to postpone urination? More than half the time     How often have you had a weak urinary stream? Less than 1 in 5 times     How often have you had to strain to start urination? Not at All     How many times did you typically get up at night to urinate? 5 Times     Total IPSS Score 15       Quality of Life due to urinary symptoms   If you were to spend the rest of your life with your urinary condition just the way it is now how would you feel about that? Terrible            Relevant past medical, surgical, family and social history reviewed and updated as indicated. Interim medical history since our last visit reviewed. Allergies and medications reviewed and updated.  Review of Systems  Constitutional: Negative for activity change, diaphoresis, fatigue and fever.  Respiratory: Negative for cough, chest tightness, shortness of breath and wheezing.   Cardiovascular: Negative for chest pain, palpitations and leg swelling.  Gastrointestinal: Negative.   Endocrine: Negative for cold  intolerance, heat intolerance, polydipsia, polyphagia and polyuria.  Genitourinary: Positive for frequency and urgency. Negative for decreased urine volume, dysuria and hematuria.  Neurological: Negative.   Psychiatric/Behavioral: Negative.     Per HPI unless specifically indicated above     Objective:    BP 128/62    Pulse 60    Temp 98.2 F (36.8 C) (Oral)    Wt 162 lb (73.5 kg)    SpO2 96%    BMI 28.70 kg/m   Wt Readings from Last 3 Encounters:  01/15/20 162 lb (73.5 kg)  12/18/19 162 lb (73.5 kg)  07/21/19 162 lb (73.5 kg)    Physical Exam Vitals and nursing note reviewed.  Constitutional:      General: He is awake.     Appearance: He is well-developed, well-groomed and overweight.  HENT:     Head: Normocephalic and atraumatic.     Right Ear: Hearing normal. No drainage.      Left Ear: Hearing normal. No drainage.     Mouth/Throat:     Pharynx: Uvula midline.  Eyes:     General: Lids are normal.        Right eye: No discharge.        Left eye: No discharge.     Conjunctiva/sclera: Conjunctivae normal.     Pupils: Pupils are equal, round, and reactive to light.  Neck:     Thyroid: No thyromegaly.     Vascular: No carotid bruit or JVD.     Trachea: Trachea normal.  Cardiovascular:     Rate and Rhythm: Normal rate and regular rhythm.     Heart sounds: Normal heart sounds, S1 normal and S2 normal. No murmur heard.  No gallop.   Pulmonary:     Effort: Pulmonary effort is normal.     Breath sounds: Normal breath sounds.  Abdominal:     General: Bowel sounds are normal.     Palpations: Abdomen is soft. There is no hepatomegaly or splenomegaly.  Musculoskeletal:        General: Normal range of motion.     Cervical back: Normal range of motion and neck supple.     Right lower leg: No edema.     Left lower leg: No edema.  Skin:    General: Skin is warm and dry.     Capillary Refill: Capillary refill takes less than 2 seconds.  Neurological:     Mental Status: He is alert and oriented to person, place, and time.     Deep Tendon Reflexes: Reflexes are normal and symmetric.  Psychiatric:        Attention and Perception: Attention normal.        Mood and Affect: Mood normal.        Speech: Speech normal.        Behavior: Behavior normal. Behavior is cooperative.        Thought Content: Thought content normal.     Results for orders placed or performed in visit on 12/22/19  HM DIABETES EYE EXAM  Result Value Ref Range   HM Diabetic Eye Exam No Retinopathy No Retinopathy      Assessment & Plan:   Problem List Items Addressed This Visit      Cardiovascular and Mediastinum   Aortic atherosclerosis (HCC) - Primary    Noted on scan 03/08/2019.  Continue statin daily and Baby ASA.  Recommend continued cessation of smoking.        Other   B12  deficiency  Ongoing, continue supplement daily -- recheck today.      Relevant Orders   Vitamin B12   Urinary incontinence    Ongoing, refuses prostate exam today -- did have moderate enlargement 03/08/19.  Urgency vs BPH, UA did note trace BLD again today. Will continue Flomax at this time, as is benefiting nocturia some.  However, still having urgency symptoms and continues to have trace blood in urine, risk factor of past smoking.  Will place urology referral.  Return in 2 months.       Relevant Orders   UA/M w/rflx Culture, Routine   Basic metabolic panel   BPH associated with nocturia    Ongoing with enlargement noted on CT scan 03/08/19.  Will continue Flomax at this time, as is benefiting nocturia some.  However, still having urgency symptoms and continues to have trace blood in urine, risk factor of past smoking.  Will place urology referral.  Return in 2 months.      Hematuria    Continues to have trace noted on UA and has risk factor of past smoking.  Urology referral placed.          Follow up plan: Return in about 2 months (around 03/16/2020) for T2DM, HTN/HLD, B12 and Vit D, urinary urgency.

## 2020-01-15 NOTE — Assessment & Plan Note (Signed)
Continues to have trace noted on UA and has risk factor of past smoking.  Urology referral placed.

## 2020-01-16 LAB — BASIC METABOLIC PANEL
BUN/Creatinine Ratio: 20 (ref 10–24)
BUN: 19 mg/dL (ref 8–27)
CO2: 20 mmol/L (ref 20–29)
Calcium: 9.8 mg/dL (ref 8.6–10.2)
Chloride: 105 mmol/L (ref 96–106)
Creatinine, Ser: 0.96 mg/dL (ref 0.76–1.27)
GFR calc Af Amer: 89 mL/min/{1.73_m2} (ref >59)
GFR calc non Af Amer: 77 mL/min/{1.73_m2} (ref >59)
Glucose: 131 mg/dL — ABNORMAL HIGH (ref 65–99)
Potassium: 4.3 mmol/L (ref 3.5–5.2)
Sodium: 138 mmol/L (ref 134–144)

## 2020-01-16 LAB — VITAMIN B12: Vitamin B-12: 1058 pg/mL (ref 232–1245)

## 2020-01-16 NOTE — Progress Notes (Signed)
Called pt told him to make sure that he is checking a fasting blood sugar in the morning and keep a log of his ready to bring his readings and meter to his next visit. Pt verbalized understanding.  KP

## 2020-01-16 NOTE — Progress Notes (Signed)
Please let Carl Bryan know his labs have returned.  Kidney function and electrolytes continue to look good.  Sugar level is coming down.  B12 level has much improved, was 220 and now 1058.  Continue daily B12 supplement for overall nervous system health.  If any questions let me know.  Have a great day!! Keep being awesome!!  Thank you for allowing me to participate in your care. Kindest regards, Bhakti Labella
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
## 2020-01-29 ENCOUNTER — Other Ambulatory Visit: Payer: Self-pay

## 2020-01-29 ENCOUNTER — Ambulatory Visit (INDEPENDENT_AMBULATORY_CARE_PROVIDER_SITE_OTHER): Payer: Medicare HMO | Admitting: Urology

## 2020-01-29 ENCOUNTER — Encounter: Payer: Self-pay | Admitting: Urology

## 2020-01-29 VITALS — BP 153/76 | HR 64 | Ht 65.0 in | Wt 162.0 lb

## 2020-01-29 DIAGNOSIS — R3129 Other microscopic hematuria: Secondary | ICD-10-CM

## 2020-01-29 DIAGNOSIS — R32 Unspecified urinary incontinence: Secondary | ICD-10-CM

## 2020-01-29 LAB — BLADDER SCAN AMB NON-IMAGING

## 2020-01-30 LAB — URINALYSIS, COMPLETE
Bilirubin, UA: NEGATIVE
Glucose, UA: NEGATIVE
Ketones, UA: NEGATIVE
Leukocytes,UA: NEGATIVE
Nitrite, UA: NEGATIVE
Specific Gravity, UA: 1.01 (ref 1.005–1.030)
Urobilinogen, Ur: 0.2 mg/dL (ref 0.2–1.0)
pH, UA: 5.5 (ref 5.0–7.5)

## 2020-01-30 LAB — MICROSCOPIC EXAMINATION

## 2020-02-13 ENCOUNTER — Telehealth: Payer: Medicare HMO

## 2020-02-18 ENCOUNTER — Telehealth: Payer: Medicare HMO

## 2020-02-18 ENCOUNTER — Telehealth: Payer: Self-pay | Admitting: General Practice

## 2020-02-18 NOTE — Telephone Encounter (Signed)
°  Chronic Care Management   Outreach Note  02/18/2020 Name: Carl Bryan MRN: [id number] DOB: 1945/12/18  Referred by: Particia Nearing, PA-C Reason for referral : Chronic Care Management (RNCM Follow up call for Chronic Disease Management and Care Coordination Needs)   An unsuccessful telephone outreach was attempted today. The patient was referred to the case management team for assistance with care management and care coordination.   Follow Up Plan: The care management team will reach out to the patient again over the next 30 to 60 days.   Alto Denver RN, MSN, CCM Community Care Coordinator [place]   [facility] Mobile: [phone]

## 2020-03-02 ENCOUNTER — Ambulatory Visit: Payer: Self-pay | Admitting: Physician Assistant

## 2020-03-08 ENCOUNTER — Other Ambulatory Visit: Payer: Self-pay

## 2020-03-08 DIAGNOSIS — E119 Type 2 diabetes mellitus without complications: Secondary | ICD-10-CM

## 2020-03-08 DIAGNOSIS — M109 Gout, unspecified: Secondary | ICD-10-CM

## 2020-03-08 DIAGNOSIS — I10 Essential (primary) hypertension: Secondary | ICD-10-CM

## 2020-03-08 NOTE — Telephone Encounter (Signed)
Does he have enough to get to his appointment next week?

## 2020-03-08 NOTE — Telephone Encounter (Signed)
Called patient, no answer, unable to leave a message, will try again.   

## 2020-03-08 NOTE — Telephone Encounter (Signed)
Refill request for:  Amlodipine 10 mg QD, QTY 90 Losartan 100 mg QD, QTY 90 Allopurinol 300 mg QD QTY 90 Metformin 500 mg 2 Tabs, BID, QTY 360 Rosuvastatin 40 mg QD, QTY 90 Glipizide 5 MG QD  QTY 90  Last OV 01/15/20 Up coming OV 03/18/20

## 2020-03-09 NOTE — Telephone Encounter (Signed)
Tried calling patient. No answer and VM not set up, will try to call again later.

## 2020-03-10 NOTE — Telephone Encounter (Signed)
Patient will be out by his appt

## 2020-03-11 MED ORDER — LOSARTAN POTASSIUM 100 MG PO TABS: 100.0000 mg | ORAL_TABLET | Freq: Every day | ORAL | 0 refills | Status: DC

## 2020-03-11 MED ORDER — ALLOPURINOL 300 MG PO TABS: 300.0000 mg | ORAL_TABLET | Freq: Every day | ORAL | 0 refills | Status: DC

## 2020-03-11 MED ORDER — METFORMIN HCL 500 MG PO TABS: 1000.0000 mg | ORAL_TABLET | Freq: Two times a day (BID) | ORAL | 0 refills | Status: DC

## 2020-03-11 MED ORDER — GLIPIZIDE 5 MG PO TABS: 5.0000 mg | ORAL_TABLET | Freq: Every day | ORAL | 0 refills | Status: DC

## 2020-03-11 MED ORDER — AMLODIPINE BESYLATE 10 MG PO TABS: 10.0000 mg | ORAL_TABLET | Freq: Every day | ORAL | 0 refills | Status: DC

## 2020-03-16 ENCOUNTER — Ambulatory Visit: Payer: Medicare HMO | Admitting: Family Medicine

## 2020-03-16 ENCOUNTER — Other Ambulatory Visit: Payer: Self-pay | Admitting: Family Medicine

## 2020-03-16 DIAGNOSIS — E7800 Pure hypercholesterolemia, unspecified: Secondary | ICD-10-CM

## 2020-03-16 MED ORDER — ROSUVASTATIN CALCIUM 40 MG PO TABS: 40.0000 mg | ORAL_TABLET | Freq: Every day | ORAL | 0 refills | Status: DC

## 2020-03-16 NOTE — Telephone Encounter (Signed)
Copied from CRM [id number]. Topic: Quick Communication - Rx Refill/Question >> Mar 16, 2020  8:32 AM Jaquita Rector A wrote: Medication: rosuvastatin (CRESTOR) 40 MG tablet  Has the patient contacted their pharmacy? Yes.   (Agent: If no, request that the patient contact the pharmacy for the refill.) (Agent: If yes, when and what did the pharmacy advise?)  Preferred Pharmacy (with phone number or street name): [facility] - [place], [place] - [address]  Phone:  [phone] Fax:  [phone]     Agent: Please be advised that RX refills may take up to 3 business days. We ask that you follow-up with your pharmacy.

## 2020-03-18 ENCOUNTER — Other Ambulatory Visit: Payer: Self-pay

## 2020-03-18 ENCOUNTER — Ambulatory Visit (INDEPENDENT_AMBULATORY_CARE_PROVIDER_SITE_OTHER): Payer: Medicare HMO | Admitting: Family Medicine

## 2020-03-18 ENCOUNTER — Telehealth: Payer: Self-pay

## 2020-03-18 ENCOUNTER — Encounter: Payer: Self-pay | Admitting: Family Medicine

## 2020-03-18 VITALS — BP 168/96 | HR 54 | Temp 97.8°F | Wt 164.2 lb

## 2020-03-18 DIAGNOSIS — E1169 Type 2 diabetes mellitus with other specified complication: Secondary | ICD-10-CM | POA: Diagnosis not present

## 2020-03-18 LAB — BAYER DCA HB A1C WAIVED: HB A1C (BAYER DCA - WAIVED): 6.6 % (ref <7.0)

## 2020-03-18 MED ORDER — AMLODIPINE BESYLATE 10 MG PO TABS: 10.0000 mg | ORAL_TABLET | Freq: Every day | ORAL | 1 refills | Status: DC

## 2020-03-18 MED ORDER — ROSUVASTATIN CALCIUM 40 MG PO TABS: 40.0000 mg | ORAL_TABLET | Freq: Every day | ORAL | 1 refills | Status: DC

## 2020-03-18 MED ORDER — METFORMIN HCL 500 MG PO TABS: 1000.0000 mg | ORAL_TABLET | Freq: Two times a day (BID) | ORAL | 1 refills | Status: DC

## 2020-03-18 MED ORDER — LOSARTAN POTASSIUM 100 MG PO TABS: 100.0000 mg | ORAL_TABLET | Freq: Every day | ORAL | 1 refills | Status: DC

## 2020-03-18 MED ORDER — MELOXICAM 15 MG PO TABS
15.0000 mg | ORAL_TABLET | Freq: Every day | ORAL | 1 refills | Status: DC
Start: 2020-03-18 — End: 2020-06-15

## 2020-03-18 MED ORDER — ALLOPURINOL 300 MG PO TABS: 300.0000 mg | ORAL_TABLET | Freq: Every day | ORAL | 1 refills | Status: DC

## 2020-03-18 MED ORDER — GLIPIZIDE 5 MG PO TABS: 5.0000 mg | ORAL_TABLET | Freq: Every day | ORAL | 1 refills | Status: DC

## 2020-03-18 NOTE — Progress Notes (Signed)
BP (!) 168/96   Pulse (!) 54   Temp 97.8 F (36.6 C)   Wt 164 lb 3.2 oz (74.5 kg)   SpO2 95%   BMI 27.32 kg/m    Subjective:    Patient ID: Carl Bryan, male    DOB: Jan 10, 1946, 74 y.o.   MRN: [id number]  HPI: Carl Bryan is a 74 y.o. male  Chief Complaint  Patient presents with  . Diabetes  . Hypertension   DIABETES Hypoglycemic episodes:no Polydipsia/polyuria: no Visual disturbance: no Chest pain: no Paresthesias: no Glucose Monitoring: no  Accucheck frequency: Not Checking Taking Insulin?: no Blood Pressure Monitoring: not checking Retinal Examination: Up to Date Foot Exam: Up to Date Diabetic Education: Completed Pneumovax: Up to Date Influenza: Up to Date Aspirin: no  Relevant past medical, surgical, family and social history reviewed and updated as indicated. Interim medical history since our last visit reviewed. Allergies and medications reviewed and updated.  Review of Systems  Constitutional: Negative.   Respiratory: Negative.   Cardiovascular: Negative.   Gastrointestinal: Negative.   Musculoskeletal: Negative.   Neurological: Negative.   Psychiatric/Behavioral: Negative.     Per HPI unless specifically indicated above     Objective:    BP (!) 168/96   Pulse (!) 54   Temp 97.8 F (36.6 C)   Wt 164 lb 3.2 oz (74.5 kg)   SpO2 95%   BMI 27.32 kg/m   Wt Readings from Last 3 Encounters:  03/18/20 164 lb 3.2 oz (74.5 kg)  01/29/20 162 lb (73.5 kg)  01/15/20 162 lb (73.5 kg)    Physical Exam Vitals and nursing note reviewed.  Constitutional:      General: He is not in acute distress.    Appearance: Normal appearance. He is not ill-appearing, toxic-appearing or diaphoretic.  HENT:     Head: Normocephalic and atraumatic.     Right Ear: External ear normal.     Left Ear: External ear normal.     Nose: Nose normal.     Mouth/Throat:     Mouth: Mucous membranes are moist.     Pharynx: Oropharynx is clear.  Eyes:     General: No scleral  icterus.       Right eye: No discharge.        Left eye: No discharge.     Extraocular Movements: Extraocular movements intact.     Conjunctiva/sclera: Conjunctivae normal.     Pupils: Pupils are equal, round, and reactive to light.  Cardiovascular:     Rate and Rhythm: Normal rate and regular rhythm.     Pulses: Normal pulses.     Heart sounds: Normal heart sounds. No murmur heard. No friction rub. No gallop.   Pulmonary:     Effort: Pulmonary effort is normal. No respiratory distress.     Breath sounds: Normal breath sounds. No stridor. No wheezing, rhonchi or rales.  Chest:     Chest wall: No tenderness.  Musculoskeletal:        General: Normal range of motion.     Cervical back: Normal range of motion and neck supple.  Skin:    General: Skin is warm and dry.     Capillary Refill: Capillary refill takes less than 2 seconds.     Coloration: Skin is not jaundiced or pale.     Findings: No bruising, erythema, lesion or rash.  Neurological:     General: No focal deficit present.     Mental Status: He is alert  and oriented to person, place, and time. Mental status is at baseline.  Psychiatric:        Mood and Affect: Mood normal.        Behavior: Behavior normal.        Thought Content: Thought content normal.        Judgment: Judgment normal.     Results for orders placed or performed in visit on 01/29/20  Microscopic Examination   Urine  Result Value Ref Range   WBC, UA 11-30 (A) 0 - 5 /hpf   RBC 0-2 0 - 2 /hpf   Epithelial Cells (non renal) 0-10 0 - 10 /hpf   Bacteria, UA Few None seen/Few  Urinalysis, Complete  Result Value Ref Range   Specific Gravity, UA 1.010 1.005 - 1.030   pH, UA 5.5 5.0 - 7.5   Color, UA Yellow Yellow   Appearance Ur Clear Clear   Leukocytes,UA Negative Negative   Protein,UA 1+ (A) Negative/Trace   Glucose, UA Negative Negative   Ketones, UA Negative Negative   RBC, UA 1+ (A) Negative   Bilirubin, UA Negative Negative   Urobilinogen, Ur  0.2 0.2 - 1.0 mg/dL   Nitrite, UA Negative Negative   Microscopic Examination See below:   BLADDER SCAN AMB NON-IMAGING  Result Value Ref Range   Scan Result 28ml       Assessment & Plan:   Problem List Items Addressed This Visit      Endocrine   Diabetes mellitus associated with hormonal etiology (HCC) - Primary    Doing well with a1c of 6.6, down from 7.2. Continue current regimen. Continue to monitor. Call with any concerns. Continue to monitor. Refills given.       Relevant Medications   metFORMIN (GLUCOPHAGE) 500 MG tablet   losartan (COZAAR) 100 MG tablet   glipiZIDE (GLUCOTROL) 5 MG tablet   amLODipine (NORVASC) 10 MG tablet   allopurinol (ZYLOPRIM) 300 MG tablet   rosuvastatin (CRESTOR) 40 MG tablet   Other Relevant Orders   Bayer DCA Hb A1c Waived       Follow up plan: Return in about 3 months (around 06/16/2020) for Physical.

## 2020-03-18 NOTE — Assessment & Plan Note (Signed)
Doing well with a1c of 6.6, down from 7.2. Continue current regimen. Continue to monitor. Call with any concerns. Continue to monitor. Refills given.

## 2020-03-18 NOTE — Chronic Care Management (AMB) (Signed)
  Care Management   Note  03/18/2020 Name: Carl Bryan MRN: [id number] DOB: 1946/01/31  [name] is a 74 y.o. year old male who is a primary care patient of Particia Nearing, Cordelia Poche and is actively engaged with the care management team. I reached out to [employer] by phone today to assist with re-scheduling a follow up visit with the RN Case Manager  Follow up plan: Unsuccessful telephone outreach attempt made.The care management team will reach out to the patient again over the next 7 days.  If patient returns call to provider office, please advise to call Embedded Care Management Care Guide Penne Lash  at [phone]  Penne Lash, RMA Care Guide, Embedded Care Coordination [facility]  [place], [place] [address] Direct Dial: [phone] [email or web address] Website: [email or web address]

## 2020-03-25 NOTE — Chronic Care Management (AMB) (Signed)
  Care Management   Note  03/25/2020 Name: XAVIAR LUNN MRN: [id number] DOB: [birth year]  Joellyn Quails Kappes is a 74 y.o. year old male who is a primary care patient of Particia Nearing, Cordelia Poche and is actively engaged with the care management team. I reached out to [employer] by phone today to assist with re-scheduling a follow up visit with the RN Case Manager  Follow up plan: Unsuccessful telephone outreach attempt made. A HIPAA compliant phone message was left for the patient providing contact information and requesting a return call.  The care management team will reach out to the patient again over the next 7 days.  If patient returns call to provider office, please advise to call Embedded Care Management Care Guide Penne Lash  at [phone]  Penne Lash, RMA Care Guide, Embedded Care Coordination [facility]  [place], [place] [address] Direct Dial: [phone] [email or web address] Website: [email or web address]

## 2020-03-29 DIAGNOSIS — S8392XA Sprain of unspecified site of left knee, initial encounter: Secondary | ICD-10-CM | POA: Diagnosis not present

## 2020-03-29 DIAGNOSIS — M13862 Other specified arthritis, left knee: Secondary | ICD-10-CM | POA: Diagnosis not present

## 2020-03-31 NOTE — Chronic Care Management (AMB) (Signed)
°  Care Management   Note  03/31/2020 Name: Carl Bryan MRN: [id number] DOB: 1945-12-18  Carl Bryan is a 74 y.o. year old male who is a primary care patient of Particia Nearing, Cordelia Poche and is actively engaged with the care management team. I reached out to [employer] by phone today to assist with re-scheduling a follow up visit with the RN Case Manager  Follow up plan: Unable to make contact on outreach attempts x 3. PCP Roosvelt Maser, PA-C notified via routed documentation in medical record.   Penne Lash, RMA Care Guide, Embedded Care Coordination [facility]  [place], [place] [address] Direct Dial: [phone] [email or web address] Website: [email or web address]

## 2020-03-31 NOTE — Telephone Encounter (Signed)
Third unsuccessful outreach  

## 2020-04-07 ENCOUNTER — Other Ambulatory Visit: Payer: Self-pay | Admitting: Family Medicine

## 2020-04-07 DIAGNOSIS — E1169 Type 2 diabetes mellitus with other specified complication: Secondary | ICD-10-CM

## 2020-04-16 ENCOUNTER — Other Ambulatory Visit: Payer: Self-pay

## 2020-04-16 NOTE — Telephone Encounter (Signed)
Copied from CRM [id number]. Topic: General - Other >> Apr 16, 2020 10:25 AM Dalphine Handing A wrote: Patients wife called in to request  refill be sent to pharmacy for tamsulosin (FLOMAX) 0.4 MG CAPS capsule. Patients wife wasn't sure which pharmacy to send medication and stated that Dr. Laural Benes would know the new pharmacy. Reviewed pharmacies listed in patients chart and wife was still unsure. Please advise

## 2020-04-16 NOTE — Telephone Encounter (Signed)
Called and discussed with patient's wife. They are no longer using [facility] for their pharmacy and need medications sent to [facility].

## 2020-04-19 MED ORDER — TAMSULOSIN HCL 0.4 MG PO CAPS
0.4000 mg | ORAL_CAPSULE | Freq: Every day | ORAL | 1 refills | Status: DC
Start: 2020-04-19 — End: 2020-06-15

## 2020-05-19 ENCOUNTER — Telehealth: Payer: Self-pay

## 2020-05-19 NOTE — Telephone Encounter (Signed)
Called to schedule Return in about 3 months (around 06/16/2020) for Physical. No answer no vm set up

## 2020-05-27 NOTE — Telephone Encounter (Signed)
Called to schedule cpe around 3/9 no answer no vm

## 2020-06-08 NOTE — Telephone Encounter (Signed)
Called to schedule cpe no answer vm not set up

## 2020-06-15 ENCOUNTER — Other Ambulatory Visit: Payer: Self-pay | Admitting: Nurse Practitioner

## 2020-06-15 DIAGNOSIS — E1169 Type 2 diabetes mellitus with other specified complication: Secondary | ICD-10-CM

## 2020-06-15 MED ORDER — AMLODIPINE BESYLATE 10 MG PO TABS: 10.0000 mg | ORAL_TABLET | Freq: Every day | ORAL | 1 refills | Status: DC

## 2020-06-15 MED ORDER — PANTOPRAZOLE SODIUM 40 MG PO TBEC
DELAYED_RELEASE_TABLET | ORAL | 3 refills | Status: DC
Start: 2020-06-15 — End: 2020-07-28

## 2020-06-15 MED ORDER — MELOXICAM 15 MG PO TABS
15.0000 mg | ORAL_TABLET | Freq: Every day | ORAL | 1 refills | Status: DC
Start: 2020-06-15 — End: 2020-07-28

## 2020-06-15 MED ORDER — TAMSULOSIN HCL 0.4 MG PO CAPS: 0.4000 mg | ORAL_CAPSULE | Freq: Every day | ORAL | 1 refills | Status: DC

## 2020-06-15 MED ORDER — GLIPIZIDE 5 MG PO TABS: 5.0000 mg | ORAL_TABLET | Freq: Every day | ORAL | 1 refills | Status: DC

## 2020-06-15 MED ORDER — ROSUVASTATIN CALCIUM 40 MG PO TABS: 40.0000 mg | ORAL_TABLET | Freq: Every day | ORAL | 1 refills | Status: DC

## 2020-06-15 NOTE — Telephone Encounter (Signed)
Copied from CRM [id number]. Topic: Quick Communication - Rx Refill/Question >> Jun 15, 2020  1:34 PM Izora Ribas, Everette A wrote: Medication: glipiZIDE (GLUCOTROL) 5 MG tablet  meloxicam (MOBIC) 15 MG tablet  rosuvastatin (CRESTOR) 40 MG tablet  tamsulosin (FLOMAX) 0.4 MG CAPS capsule  pantoprazole (PROTONIX) 40 MG tablet  amLODipine (NORVASC) 10 MG tablet   Has the patient contacted their pharmacy? Yes. Patient's wife made contact on behalf of patient and been directed to contact PCP  Preferred Pharmacy (with phone number or street name): [facility] - [place], [place] - [address] [place], [address]  Phone:  [phone]  Agent: Please be advised that RX refills may take up to 3 business days. We ask that you follow-up with your pharmacy.

## 2020-06-23 ENCOUNTER — Encounter: Payer: Self-pay | Admitting: Nurse Practitioner

## 2020-07-07 ENCOUNTER — Telehealth: Payer: Self-pay | Admitting: Nurse Practitioner

## 2020-07-07 NOTE — Telephone Encounter (Signed)
Carl Bryan, from [facility], calling and is requesting to verify whether the office received a surgical clearance form for pt. She states that she will fax it over again. Please advise.     [phone] ext [phone]

## 2020-07-08 NOTE — Telephone Encounter (Signed)
Carl Bryan, from [facility] states she will refax the surgical clearance to our office. Patient is scheduled for surgery on 09/29/20 per Carl Bryan.

## 2020-07-08 NOTE — Telephone Encounter (Signed)
Needs to be within 30 days of his surgery

## 2020-07-08 NOTE — Telephone Encounter (Signed)
Paperwork was received and placed in incomplete bin until appointment.   Patient has CPE appointment 07/28/20. Can this be done at that appointment or does it need to be separate?

## 2020-07-08 NOTE — Telephone Encounter (Signed)
scheduled

## 2020-07-28 ENCOUNTER — Other Ambulatory Visit: Payer: Self-pay

## 2020-07-28 ENCOUNTER — Encounter: Payer: Self-pay | Admitting: Family Medicine

## 2020-07-28 ENCOUNTER — Ambulatory Visit (INDEPENDENT_AMBULATORY_CARE_PROVIDER_SITE_OTHER): Payer: Medicare Other | Admitting: Family Medicine

## 2020-07-28 VITALS — BP 149/77 | HR 64 | Temp 98.2°F | Wt 164.0 lb

## 2020-07-28 DIAGNOSIS — E1159 Type 2 diabetes mellitus with other circulatory complications: Secondary | ICD-10-CM

## 2020-07-28 DIAGNOSIS — I152 Hypertension secondary to endocrine disorders: Secondary | ICD-10-CM

## 2020-07-28 DIAGNOSIS — N401 Enlarged prostate with lower urinary tract symptoms: Secondary | ICD-10-CM

## 2020-07-28 DIAGNOSIS — I700 Atherosclerosis of aorta: Secondary | ICD-10-CM | POA: Diagnosis not present

## 2020-07-28 DIAGNOSIS — K219 Gastro-esophageal reflux disease without esophagitis: Secondary | ICD-10-CM | POA: Diagnosis not present

## 2020-07-28 DIAGNOSIS — E538 Deficiency of other specified B group vitamins: Secondary | ICD-10-CM

## 2020-07-28 DIAGNOSIS — Z0000 Encounter for general adult medical examination without abnormal findings: Secondary | ICD-10-CM

## 2020-07-28 DIAGNOSIS — M109 Gout, unspecified: Secondary | ICD-10-CM

## 2020-07-28 DIAGNOSIS — R351 Nocturia: Secondary | ICD-10-CM

## 2020-07-28 DIAGNOSIS — E1169 Type 2 diabetes mellitus with other specified complication: Secondary | ICD-10-CM

## 2020-07-28 DIAGNOSIS — E785 Hyperlipidemia, unspecified: Secondary | ICD-10-CM

## 2020-07-28 DIAGNOSIS — S78111D Complete traumatic amputation at level between right hip and knee, subsequent encounter: Secondary | ICD-10-CM

## 2020-07-28 DIAGNOSIS — E559 Vitamin D deficiency, unspecified: Secondary | ICD-10-CM

## 2020-07-28 LAB — URINALYSIS, ROUTINE W REFLEX MICROSCOPIC
Bilirubin, UA: NEGATIVE
Glucose, UA: NEGATIVE
Ketones, UA: NEGATIVE
Leukocytes,UA: NEGATIVE
Nitrite, UA: NEGATIVE
Specific Gravity, UA: <1.005 — ABNORMAL LOW (ref 1.005–1.030)
Urobilinogen, Ur: 0.2 mg/dL (ref 0.2–1.0)
pH, UA: 6.5 (ref 5.0–7.5)

## 2020-07-28 LAB — BAYER DCA HB A1C WAIVED: HB A1C (BAYER DCA - WAIVED): 7.5 % — ABNORMAL HIGH (ref <7.0)

## 2020-07-28 LAB — MICROSCOPIC EXAMINATION
Epithelial Cells (non renal): NONE SEEN /hpf (ref 0–10)
WBC, UA: NONE SEEN /hpf (ref 0–5)

## 2020-07-28 LAB — MICROALBUMIN, URINE WAIVED
Creatinine, Urine Waived: 50 mg/dL (ref 10–300)
Microalb, Ur Waived: 150 mg/L — ABNORMAL HIGH (ref 0–19)
Microalb/Creat Ratio: >300 mg/g — ABNORMAL HIGH (ref <30)

## 2020-07-28 NOTE — Progress Notes (Signed)
BP (!) 149/77   Pulse 64   Temp 98.2 F (36.8 C)   Wt 164 lb (74.4 kg)   SpO2 97%   BMI 27.29 kg/m    Subjective:    Patient ID: Carl Bryan, male    DOB: 08-Dec-1945, 75 y.o.   MRN: [id number]  HPI: Carl Bryan is a 75 y.o. male presenting on 07/28/2020 for comprehensive medical examination. Current medical complaints include:  DIABETES Hypoglycemic episodes:no Polydipsia/polyuria: no Visual disturbance: no Chest pain: no Paresthesias: no Glucose Monitoring: yes  Accucheck frequency: Daily  Fasting glucose: 140 Taking Insulin?: no Blood Pressure Monitoring: rarely Retinal Examination: Up to Date Foot Exam: Up to Date Diabetic Education: Completed Pneumovax: Up to Date Influenza: Up to Date Aspirin: yes  HYPERTENSION / HYPERLIPIDEMIA Satisfied with current treatment? yes Duration of hypertension: chronic BP monitoring frequency: not checking BP medication side effects: no Past BP meds: losartan, amlodipine Duration of hyperlipidemia: chronic Cholesterol medication side effects: no Cholesterol supplements: none Past cholesterol medications: crestor Medication compliance: excellent compliance Aspirin: no Recent stressors: no Recurrent headaches: no Visual changes: no Palpitations: no Dyspnea: no Chest pain: no Lower extremity edema: no Dizzy/lightheaded: no  No gout flares, tolerating his allopurinol well. No concerns.   He currently lives with: wife Interim Problems from his last visit: no  Depression Screen done today and results listed below:  Depression screen [facility] 2/9 07/28/2020 07/31/2019 07/02/2019 07/31/2018 07/30/2017  Decreased Interest 0 0 0 0 0  Down, Depressed, Hopeless 0 0 0 0 0  PHQ - 2 Score 0 0 0 0 0    Past Medical History:  Past Medical History:  Diagnosis Date  . Bleeding ulcer   . Bronchospasm   . Diabetes mellitus without complication (HCC)    Type 2  . Diverticulosis   . Pure hypercholesterolemia   . Traumatic amputation of leg  above knee [facility])     Surgical History:  Past Surgical History:  Procedure Laterality Date  . ABOVE KNEE LEG AMPUTATION  1971  . APPENDECTOMY    . KNEE SURGERY     x 2.    Medications:  Current Outpatient Medications on File Prior to Visit  Medication Sig  . ACCU-CHEK AVIVA PLUS test strip Check 1 time a day  . blood glucose meter kit and supplies KIT To check blood sugar twice a day. (FOR ICD-9 250.00, 250.01).  . Blood Glucose Monitoring Suppl (ACCU-CHEK AVIVA PLUS) w/Device KIT Test 1 time daily  . Vitamin D, Ergocalciferol, (DRISDOL) 1.25 MG (50000 UNIT) CAPS capsule Take 1 capsule (50,000 Units total) by mouth every 7 (seven) days. (Patient not taking: No sig reported)   No current facility-administered medications on file prior to visit.    Allergies:  Allergies  Allergen Reactions  . Doxycycline Calcium Rash  . Tussionex Pennkinetic Er [Hydrocod Polst-Cpm Polst Er] Itching  . Vytorin [Ezetimibe-Simvastatin]     Sleepy    Social History:  Social History   Socioeconomic History  . Marital status: Married    Spouse name: Not on file  . Number of children: Not on file  . Years of education: Not on file  . Highest education level: 6th grade  Occupational History  . Occupation: Retired  Tobacco Use  . Smoking status: Former Smoker    Types: Cigarettes    Quit date: 09/29/1964    Years since quitting: 55.8  . Smokeless tobacco: Never Used  Vaping Use  . Vaping Use: Never used  Substance and  Sexual Activity  . Alcohol use: No    Alcohol/week: 0.0 standard drinks  . Drug use: No  . Sexual activity: Not on file  Other Topics Concern  . Not on file  Social History Narrative  . Not on file   Social Determinants of Health   Financial Resource Strain: Not on file  Food Insecurity: Not on file  Transportation Needs: Not on file  Physical Activity: Not on file  Stress: Not on file  Social Connections: Not on file  Intimate Partner Violence: Not on file    Social History   Tobacco Use  Smoking Status Former Smoker  . Types: Cigarettes  . Quit date: 09/29/1964  . Years since quitting: 55.8  Smokeless Tobacco Never Used   Social History   Substance and Sexual Activity  Alcohol Use No  . Alcohol/week: 0.0 standard drinks    Family History:  Family History  Problem Relation Age of Onset  . Cancer Mother        Colon cancer  . Lung disease Father     Past medical history, surgical history, medications, allergies, family history and social history reviewed with patient today and changes made to appropriate areas of the chart.   Review of Systems  Constitutional: Negative.   HENT: Positive for hearing loss. Negative for congestion, ear discharge, ear pain, nosebleeds, sinus pain, sore throat and tinnitus.   Eyes: Negative.   Respiratory: Negative.  Negative for stridor.   Cardiovascular: Negative.   Gastrointestinal: Positive for diarrhea. Negative for abdominal pain, blood in stool, constipation, heartburn, melena, nausea and vomiting.  Genitourinary: Negative.   Musculoskeletal: Negative.   Skin: Negative.   Neurological: Negative.   Endo/Heme/Allergies: Negative.   Psychiatric/Behavioral: Negative.    All other ROS negative except what is listed above and in the HPI.      Objective:    BP (!) 149/77   Pulse 64   Temp 98.2 F (36.8 C)   Wt 164 lb (74.4 kg)   SpO2 97%   BMI 27.29 kg/m   Wt Readings from Last 3 Encounters:  07/28/20 164 lb (74.4 kg)  03/18/20 164 lb 3.2 oz (74.5 kg)  01/29/20 162 lb (73.5 kg)    Physical Exam Vitals and nursing note reviewed.  Constitutional:      General: He is not in acute distress.    Appearance: Normal appearance. He is obese. He is not ill-appearing, toxic-appearing or diaphoretic.  HENT:     Head: Normocephalic and atraumatic.     Right Ear: Tympanic membrane, ear canal and external ear normal. There is no impacted cerumen.     Left Ear: Tympanic membrane, ear canal  and external ear normal. There is no impacted cerumen.     Nose: Nose normal. No congestion or rhinorrhea.     Mouth/Throat:     Mouth: Mucous membranes are moist.     Pharynx: Oropharynx is clear. No oropharyngeal exudate or posterior oropharyngeal erythema.  Eyes:     General: No scleral icterus.       Right eye: No discharge.        Left eye: No discharge.     Extraocular Movements: Extraocular movements intact.     Conjunctiva/sclera: Conjunctivae normal.     Pupils: Pupils are equal, round, and reactive to light.  Neck:     Vascular: No carotid bruit.  Cardiovascular:     Rate and Rhythm: Normal rate and regular rhythm.     Pulses: Normal pulses.  Heart sounds: No murmur heard. No friction rub. No gallop.   Pulmonary:     Effort: Pulmonary effort is normal. No respiratory distress.     Breath sounds: Normal breath sounds. No stridor. No wheezing, rhonchi or rales.  Chest:     Chest wall: No tenderness.  Abdominal:     General: Abdomen is flat. Bowel sounds are normal. There is no distension.     Palpations: Abdomen is soft. There is no mass.     Tenderness: There is no abdominal tenderness. There is no right CVA tenderness, left CVA tenderness, guarding or rebound.     Hernia: No hernia is present.  Genitourinary:    Comments: Genital exam deferred with shared decision making Musculoskeletal:        General: No swelling, tenderness, deformity or signs of injury.     Cervical back: Normal range of motion and neck supple. No rigidity. No muscular tenderness.     Right lower leg: No edema.     Left lower leg: No edema.  Lymphadenopathy:     Cervical: No cervical adenopathy.  Skin:    General: Skin is warm and dry.     Capillary Refill: Capillary refill takes less than 2 seconds.     Coloration: Skin is not jaundiced or pale.     Findings: No bruising, erythema, lesion or rash.  Neurological:     General: No focal deficit present.     Mental Status: He is alert and  oriented to person, place, and time.     Cranial Nerves: No cranial nerve deficit.     Sensory: No sensory deficit.     Motor: No weakness.     Coordination: Coordination normal.     Gait: Gait normal.     Deep Tendon Reflexes: Reflexes normal.  Psychiatric:        Mood and Affect: Mood normal.        Behavior: Behavior normal.        Thought Content: Thought content normal.        Judgment: Judgment normal.     Results for orders placed or performed in visit on 07/28/20  Microscopic Examination   Urine  Result Value Ref Range   WBC, UA None seen 0 - 5 /hpf   RBC 3-10 (A) 0 - 2 /hpf   Epithelial Cells (non renal) None seen 0 - 10 /hpf   Bacteria, UA Few (A) None seen/Few  Bayer DCA Hb A1c Waived  Result Value Ref Range   HB A1C (BAYER DCA - WAIVED) 7.5 (H) <7.0 %  Microalbumin, Urine Waived  Result Value Ref Range   Microalb, Ur Waived 150 (H) 0 - 19 mg/L   Creatinine, Urine Waived 50 10 - 300 mg/dL   Microalb/Creat Ratio >300 (H) <30 mg/g  Comprehensive metabolic panel  Result Value Ref Range   Glucose 156 (H) 65 - 99 mg/dL   BUN 20 8 - 27 mg/dL   Creatinine, Ser 1.02 0.76 - 1.27 mg/dL   eGFR 76 >59 mL/min/1.73   BUN/Creatinine Ratio 20 10 - 24   Sodium 135 134 - 144 mmol/L   Potassium 4.1 3.5 - 5.2 mmol/L   Chloride 99 96 - 106 mmol/L   CO2 17 (L) 20 - 29 mmol/L   Calcium 10.1 8.6 - 10.2 mg/dL   Total Protein 6.5 6.0 - 8.5 g/dL   Albumin 4.5 3.7 - 4.7 g/dL   Globulin, Total 2.0 1.5 - 4.5 g/dL   Albumin/Globulin  Ratio 2.3 (H) 1.2 - 2.2   Bilirubin Total 0.6 0.0 - 1.2 mg/dL   Alkaline Phosphatase 68 44 - 121 IU/L   AST 19 0 - 40 IU/L   ALT 29 0 - 44 IU/L  CBC with Differential/Platelet  Result Value Ref Range   WBC 9.6 3.4 - 10.8 x10E3/uL   RBC 5.03 4.14 - 5.80 x10E6/uL   Hemoglobin 16.0 13.0 - 17.7 g/dL   Hematocrit 46.6 37.5 - 51.0 %   MCV 93 79 - 97 fL   MCH 31.8 26.6 - 33.0 pg   MCHC 34.3 31.5 - 35.7 g/dL   RDW 13.2 11.6 - 15.4 %   Platelets 201 150 -  450 x10E3/uL   Neutrophils 75 Not Estab. %   Lymphs 14 Not Estab. %   Monocytes 7 Not Estab. %   Eos 2 Not Estab. %   Basos 1 Not Estab. %   Neutrophils Absolute 7.3 (H) 1.4 - 7.0 x10E3/uL   Lymphocytes Absolute 1.3 0.7 - 3.1 x10E3/uL   Monocytes Absolute 0.6 0.1 - 0.9 x10E3/uL   EOS (ABSOLUTE) 0.2 0.0 - 0.4 x10E3/uL   Basophils Absolute 0.1 0.0 - 0.2 x10E3/uL   Immature Granulocytes 1 Not Estab. %   Immature Grans (Abs) 0.1 0.0 - 0.1 x10E3/uL  Lipid Panel w/o Chol/HDL Ratio  Result Value Ref Range   Cholesterol, Total 144 100 - 199 mg/dL   Triglycerides 131 0 - 149 mg/dL   HDL 39 (L) >39 mg/dL   VLDL Cholesterol Cal 23 5 - 40 mg/dL   LDL Chol Calc (NIH) 82 0 - 99 mg/dL  PSA  Result Value Ref Range   Prostate Specific Ag, Serum 2.4 0.0 - 4.0 ng/mL  TSH  Result Value Ref Range   TSH 3.200 0.450 - 4.500 uIU/mL  Urinalysis, Routine w reflex microscopic  Result Value Ref Range   Specific Gravity, UA <1.005 (L) 1.005 - 1.030   pH, UA 6.5 5.0 - 7.5   Color, UA Yellow Yellow   Appearance Ur Clear Clear   Leukocytes,UA Negative Negative   Protein,UA 1+ (A) Negative/Trace   Glucose, UA Negative Negative   Ketones, UA Negative Negative   RBC, UA 3+ (A) Negative   Bilirubin, UA Negative Negative   Urobilinogen, Ur 0.2 0.2 - 1.0 mg/dL   Nitrite, UA Negative Negative   Microscopic Examination See below:   Uric acid  Result Value Ref Range   Uric Acid 2.4 (L) 3.8 - 8.4 mg/dL  B12  Result Value Ref Range   Vitamin B-12 1,908 (H) 232 - 1,245 pg/mL  VITAMIN D 25 Hydroxy (Vit-D Deficiency, Fractures)  Result Value Ref Range   Vit D, 25-Hydroxy 24.5 (L) 30.0 - 100.0 ng/mL      Assessment & Plan:   Problem List Items Addressed This Visit      Cardiovascular and Mediastinum   Hypertension associated with diabetes ([place])    Under good control on current regimen. Continue current regimen. Continue to monitor. Call with any concerns. Refills given. Labs drawn today.        Relevant Medications   rosuvastatin (CRESTOR) 40 MG tablet   metFORMIN (GLUCOPHAGE) 500 MG tablet   losartan (COZAAR) 100 MG tablet   glipiZIDE (GLUCOTROL) 5 MG tablet   amLODipine (NORVASC) 10 MG tablet   Other Relevant Orders   Microalbumin, Urine Waived (Completed)   Comprehensive metabolic panel (Completed)   CBC with Differential/Platelet (Completed)   TSH (Completed)   Urinalysis, Routine w reflex  microscopic (Completed)   Aortic atherosclerosis (HCC)    Under good control on current regimen. Continue current regimen. Continue to monitor. Call with any concerns. Refills given. Labs drawn today.      Relevant Medications   rosuvastatin (CRESTOR) 40 MG tablet   losartan (COZAAR) 100 MG tablet   amLODipine (NORVASC) 10 MG tablet   Other Relevant Orders   Comprehensive metabolic panel (Completed)   CBC with Differential/Platelet (Completed)   Urinalysis, Routine w reflex microscopic (Completed)     Digestive   GERD (gastroesophageal reflux disease)    Under good control on current regimen. Continue current regimen. Continue to monitor. Call with any concerns. Refills given. Labs drawn today.      Relevant Medications   pantoprazole (PROTONIX) 40 MG tablet   Other Relevant Orders   Comprehensive metabolic panel (Completed)   CBC with Differential/Platelet (Completed)   Urinalysis, Routine w reflex microscopic (Completed)     Endocrine   Hyperlipidemia associated with type 2 diabetes mellitus ([place])    Under good control on current regimen. Continue current regimen. Continue to monitor. Call with any concerns. Refills given. Labs drawn today.      Relevant Medications   rosuvastatin (CRESTOR) 40 MG tablet   metFORMIN (GLUCOPHAGE) 500 MG tablet   losartan (COZAAR) 100 MG tablet   glipiZIDE (GLUCOTROL) 5 MG tablet   Other Relevant Orders   Comprehensive metabolic panel (Completed)   CBC with Differential/Platelet (Completed)   Lipid Panel w/o Chol/HDL Ratio (Completed)    Urinalysis, Routine w reflex microscopic (Completed)   Diabetes mellitus associated with hormonal etiology ([place])    Going in the wrong direction with A1c of 7.5. Will start jardiance and recheck 3 months. Call with any concerns.       Relevant Medications   rosuvastatin (CRESTOR) 40 MG tablet   metFORMIN (GLUCOPHAGE) 500 MG tablet   losartan (COZAAR) 100 MG tablet   glipiZIDE (GLUCOTROL) 5 MG tablet   amLODipine (NORVASC) 10 MG tablet   allopurinol (ZYLOPRIM) 300 MG tablet   Other Relevant Orders   Bayer DCA Hb A1c Waived (Completed)   Microalbumin, Urine Waived (Completed)   Comprehensive metabolic panel (Completed)   CBC with Differential/Platelet (Completed)   Urinalysis, Routine w reflex microscopic (Completed)     Other   Traumatic amputation of right leg above knee (HCC)    Doing well. No concerns. Continue to monitor.       Gout    Under good control on current regimen. Continue current regimen. Continue to monitor. Call with any concerns. Refills given. Labs drawn today.        Relevant Orders   Comprehensive metabolic panel (Completed)   CBC with Differential/Platelet (Completed)   Urinalysis, Routine w reflex microscopic (Completed)   Uric acid (Completed)   Vitamin D deficiency    Rechecking labs today. Await results. Treat as needed.       Relevant Orders   Comprehensive metabolic panel (Completed)   CBC with Differential/Platelet (Completed)   Urinalysis, Routine w reflex microscopic (Completed)   VITAMIN D 25 Hydroxy (Vit-D Deficiency, Fractures) (Completed)   B12 deficiency    Rechecking labs today. Await results. Treat as needed.       Relevant Orders   Comprehensive metabolic panel (Completed)   CBC with Differential/Platelet (Completed)   Urinalysis, Routine w reflex microscopic (Completed)   B12 (Completed)   BPH associated with nocturia    Under good control on current regimen. Continue current regimen. Continue to monitor. Call  with any  concerns. Refills given. Labs drawn today.        Relevant Orders   Comprehensive metabolic panel (Completed)   CBC with Differential/Platelet (Completed)   PSA (Completed)   Urinalysis, Routine w reflex microscopic (Completed)    Other Visit Diagnoses    Routine general medical examination at a health care facility    -  Primary   Vaccines up to date. Screening labs checked today. Colonoscopy up to date. Continue diet and exercise. Call with any concerns.        Discussed aspirin prophylaxis for myocardial infarction prevention and decision was made to continue ASA  LABORATORY TESTING:  Health maintenance labs ordered today as discussed above.   The natural history of prostate cancer and ongoing controversy regarding screening and potential treatment outcomes of prostate cancer has been discussed with the patient. The meaning of a false positive PSA and a false negative PSA has been discussed. He indicates understanding of the limitations of this screening test and wishes to proceed with screening PSA testing.   IMMUNIZATIONS:   - Tdap: Tetanus vaccination status reviewed: last tetanus booster within 10 years. - Influenza: Up to date - Pneumovax: Up to date - Prevnar: Up to date - COVID: Up to date  SCREENING: - Colonoscopy: Up to date  Discussed with patient purpose of the colonoscopy is to detect colon cancer at curable precancerous or early stages   PATIENT COUNSELING:    Sexuality: Discussed sexually transmitted diseases, partner selection, use of condoms, avoidance of unintended pregnancy  and contraceptive alternatives.   Advised to avoid cigarette smoking.  I discussed with the patient that most people either abstain from alcohol or drink within safe limits (<=14/week and <=4 drinks/occasion for males, <=7/weeks and <= 3 drinks/occasion for females) and that the risk for alcohol disorders and other health effects rises proportionally with the number of drinks per week  and how often a drinker exceeds daily limits.  Discussed cessation/primary prevention of drug use and availability of treatment for abuse.   Diet: Encouraged to adjust caloric intake to maintain  or achieve ideal body weight, to reduce intake of dietary saturated fat and total fat, to limit sodium intake by avoiding high sodium foods and not adding table salt, and to maintain adequate dietary potassium and calcium preferably from fresh fruits, vegetables, and low-fat dairy products.    stressed the importance of regular exercise  Injury prevention: Discussed safety belts, safety helmets, smoke detector, smoking near bedding or upholstery.   Dental health: Discussed importance of regular tooth brushing, flossing, and dental visits.   Follow up plan: NEXT PREVENTATIVE PHYSICAL DUE IN 1 YEAR. Return in about 6 months (around 01/27/2021).

## 2020-07-29 LAB — PSA: Prostate Specific Ag, Serum: 2.4 ng/mL (ref 0.0–4.0)

## 2020-07-29 LAB — LIPID PANEL W/O CHOL/HDL RATIO
Cholesterol, Total: 144 mg/dL (ref 100–199)
HDL: 39 mg/dL — ABNORMAL LOW (ref >39)
LDL Chol Calc (NIH): 82 mg/dL (ref 0–99)
Triglycerides: 131 mg/dL (ref 0–149)
VLDL Cholesterol Cal: 23 mg/dL (ref 5–40)

## 2020-07-29 LAB — COMPREHENSIVE METABOLIC PANEL WITH GFR
ALT: 29 IU/L (ref 0–44)
AST: 19 IU/L (ref 0–40)
Albumin/Globulin Ratio: 2.3 — ABNORMAL HIGH (ref 1.2–2.2)
Albumin: 4.5 g/dL (ref 3.7–4.7)
Alkaline Phosphatase: 68 IU/L (ref 44–121)
BUN/Creatinine Ratio: 20 (ref 10–24)
BUN: 20 mg/dL (ref 8–27)
Bilirubin Total: 0.6 mg/dL (ref 0.0–1.2)
CO2: 17 mmol/L — ABNORMAL LOW (ref 20–29)
Calcium: 10.1 mg/dL (ref 8.6–10.2)
Chloride: 99 mmol/L (ref 96–106)
Creatinine, Ser: 1.02 mg/dL (ref 0.76–1.27)
Globulin, Total: 2 g/dL (ref 1.5–4.5)
Glucose: 156 mg/dL — ABNORMAL HIGH (ref 65–99)
Potassium: 4.1 mmol/L (ref 3.5–5.2)
Sodium: 135 mmol/L (ref 134–144)
Total Protein: 6.5 g/dL (ref 6.0–8.5)
eGFR: 76 mL/min/1.73

## 2020-07-29 LAB — CBC WITH DIFFERENTIAL/PLATELET
Basophils Absolute: 0.1 10*3/uL (ref 0.0–0.2)
Basos: 1 %
EOS (ABSOLUTE): 0.2 10*3/uL (ref 0.0–0.4)
Eos: 2 %
Hematocrit: 46.6 % (ref 37.5–51.0)
Hemoglobin: 16 g/dL (ref 13.0–17.7)
Immature Grans (Abs): 0.1 10*3/uL (ref 0.0–0.1)
Immature Granulocytes: 1 %
Lymphocytes Absolute: 1.3 10*3/uL (ref 0.7–3.1)
Lymphs: 14 %
MCH: 31.8 pg (ref 26.6–33.0)
MCHC: 34.3 g/dL (ref 31.5–35.7)
MCV: 93 fL (ref 79–97)
Monocytes Absolute: 0.6 10*3/uL (ref 0.1–0.9)
Monocytes: 7 %
Neutrophils Absolute: 7.3 10*3/uL — ABNORMAL HIGH (ref 1.4–7.0)
Neutrophils: 75 %
Platelets: 201 10*3/uL (ref 150–450)
RBC: 5.03 x10E6/uL (ref 4.14–5.80)
RDW: 13.2 % (ref 11.6–15.4)
WBC: 9.6 10*3/uL (ref 3.4–10.8)

## 2020-07-29 LAB — VITAMIN D 25 HYDROXY (VIT D DEFICIENCY, FRACTURES): Vit D, 25-Hydroxy: 24.5 ng/mL — ABNORMAL LOW (ref 30.0–100.0)

## 2020-07-29 LAB — VITAMIN B12: Vitamin B-12: 1908 pg/mL — ABNORMAL HIGH (ref 232–1245)

## 2020-07-29 LAB — TSH: TSH: 3.2 u[IU]/mL (ref 0.450–4.500)

## 2020-07-29 LAB — URIC ACID: Uric Acid: 2.4 mg/dL — ABNORMAL LOW (ref 3.8–8.4)

## 2020-07-30 ENCOUNTER — Encounter: Payer: Self-pay | Admitting: Family Medicine

## 2020-07-30 MED ORDER — PANTOPRAZOLE SODIUM 40 MG PO TBEC: DELAYED_RELEASE_TABLET | ORAL | 3 refills | Status: DC

## 2020-07-30 MED ORDER — ROSUVASTATIN CALCIUM 40 MG PO TABS: 40.0000 mg | ORAL_TABLET | Freq: Every day | ORAL | 1 refills | Status: DC

## 2020-07-30 MED ORDER — AMLODIPINE BESYLATE 10 MG PO TABS: 10.0000 mg | ORAL_TABLET | Freq: Every day | ORAL | 1 refills | Status: DC

## 2020-07-30 MED ORDER — ALLOPURINOL 300 MG PO TABS: 300.0000 mg | ORAL_TABLET | Freq: Every day | ORAL | 1 refills | Status: DC

## 2020-07-30 MED ORDER — METFORMIN HCL 500 MG PO TABS: 1000.0000 mg | ORAL_TABLET | Freq: Two times a day (BID) | ORAL | 1 refills | Status: DC

## 2020-07-30 MED ORDER — GLIPIZIDE 5 MG PO TABS: 5.0000 mg | ORAL_TABLET | Freq: Every day | ORAL | 1 refills | Status: DC

## 2020-07-30 MED ORDER — MELOXICAM 15 MG PO TABS: 15.0000 mg | ORAL_TABLET | Freq: Every day | ORAL | 1 refills | Status: DC

## 2020-07-30 MED ORDER — TAMSULOSIN HCL 0.4 MG PO CAPS: 0.4000 mg | ORAL_CAPSULE | Freq: Every day | ORAL | 1 refills | Status: DC

## 2020-07-30 MED ORDER — VITAMIN B-12 1000 MCG PO TABS: 1000.0000 ug | ORAL_TABLET | Freq: Every day | ORAL | 4 refills | Status: DC

## 2020-07-30 MED ORDER — LOSARTAN POTASSIUM 100 MG PO TABS: 100.0000 mg | ORAL_TABLET | Freq: Every day | ORAL | 1 refills | Status: DC

## 2020-07-30 NOTE — Assessment & Plan Note (Signed)
Under good control on current regimen. Continue current regimen. Continue to monitor. Call with any concerns. Refills given. Labs drawn today.   

## 2020-07-30 NOTE — Assessment & Plan Note (Signed)
Going in the wrong direction with A1c of 7.5. Will start jardiance and recheck 3 months. Call with any concerns.

## 2020-07-30 NOTE — Assessment & Plan Note (Signed)
Rechecking labs today. Await results. Treat as needed.  °

## 2020-07-30 NOTE — Assessment & Plan Note (Signed)
Doing well. No concerns. Continue to monitor.  

## 2020-08-02 ENCOUNTER — Ambulatory Visit (INDEPENDENT_AMBULATORY_CARE_PROVIDER_SITE_OTHER): Payer: Medicare Other

## 2020-08-02 VITALS — Ht 65.0 in | Wt 164.0 lb

## 2020-08-02 DIAGNOSIS — Z0000 Encounter for general adult medical examination without abnormal findings: Secondary | ICD-10-CM

## 2020-08-02 NOTE — Progress Notes (Signed)
I connected with Carl Bryan today by telephone and verified that I am speaking with the correct person using two identifiers. Location patient: home Location provider: work Persons participating in the virtual visit: [name], [name].   I discussed the limitations, risks, security and privacy concerns of performing an evaluation and management service by telephone and the availability of in person appointments. I also discussed with the patient that there may be a patient responsible charge related to this service. The patient expressed understanding and verbally consented to this telephonic visit.    Interactive audio and video telecommunications were attempted between this provider and patient, however failed, due to patient having technical difficulties OR patient did not have access to video capability.  We continued and completed visit with audio only.     Vital signs may be patient reported or missing.  Subjective:   Carl Bryan is a 75 y.o. male who presents for Medicare Annual/Subsequent preventive examination.  Review of Systems     Cardiac Risk Factors include: advanced age (>70mn, >>42women);diabetes mellitus;dyslipidemia;hypertension;male gender;sedentary lifestyle     Objective:    Today's Vitals   08/02/20 0942 08/02/20 0943  Weight: 164 lb (74.4 kg)   Height: '5\' 5"'  (1.651 m)   PainSc:  8    Body mass index is 27.29 kg/m.  Advanced Directives 08/02/2020 03/06/2019 03/05/2019 11/20/2018 07/31/2018 07/30/2017  Does Patient Have a Medical Advance Directive? Yes No No No No No  Type of Advance Directive Living will - - - - -  Would patient like information on creating a medical advance directive? - - No - Patient declined No - Patient declined No - Patient declined No - Patient declined    Current Medications (verified) Outpatient Encounter Medications as of 08/02/2020  Medication Sig  . ACCU-CHEK AVIVA PLUS test strip Check 1 time a day  . allopurinol  (ZYLOPRIM) 300 MG tablet Take 1 tablet (300 mg total) by mouth daily.  .Marland KitchenamLODipine (NORVASC) 10 MG tablet Take 1 tablet (10 mg total) by mouth daily.  . blood glucose meter kit and supplies KIT To check blood sugar twice a day. (FOR ICD-9 250.00, 250.01).  . Blood Glucose Monitoring Suppl (ACCU-CHEK AVIVA PLUS) w/Device KIT Test 1 time daily  . glipiZIDE (GLUCOTROL) 5 MG tablet Take 1 tablet (5 mg total) by mouth daily before breakfast.  . losartan (COZAAR) 100 MG tablet Take 1 tablet (100 mg total) by mouth daily.  . meloxicam (MOBIC) 15 MG tablet Take 1 tablet (15 mg total) by mouth daily.  . metFORMIN (GLUCOPHAGE) 500 MG tablet Take 2 tablets (1,000 mg total) by mouth 2 (two) times daily with a meal.  . pantoprazole (PROTONIX) 40 MG tablet TAKE 1 TABLET 2 TIMES DAILY BEFORE A MEAL.  . rosuvastatin (CRESTOR) 40 MG tablet Take 1 tablet (40 mg total) by mouth daily.  . tamsulosin (FLOMAX) 0.4 MG CAPS capsule Take 1 capsule (0.4 mg total) by mouth daily.  . vitamin B-12 (CYANOCOBALAMIN) 1000 MCG tablet Take 1 tablet (1,000 mcg total) by mouth daily.  . Vitamin D, Ergocalciferol, (DRISDOL) 1.25 MG (50000 UNIT) CAPS capsule Take 1 capsule (50,000 Units total) by mouth every 7 (seven) days. (Patient not taking: No sig reported)   No facility-administered encounter medications on file as of 08/02/2020.    Allergies (verified) Doxycycline calcium, Tussionex pennkinetic er [Aflac Incorporatedpolst-cpm polst er], and Vytorin [ezetimibe-simvastatin]   History: Past Medical History:  Diagnosis Date  . Bleeding ulcer   .  Bronchospasm   . Diabetes mellitus without complication (HCC)    Type 2  . Diverticulosis   . Pure hypercholesterolemia   . Traumatic amputation of leg above knee [facility])    Past Surgical History:  Procedure Laterality Date  . ABOVE KNEE LEG AMPUTATION  1971  . APPENDECTOMY    . KNEE SURGERY     x 2.   Family History  Problem Relation Age of Onset  . Cancer Mother        Colon  cancer  . Lung disease Father    Social History   Socioeconomic History  . Marital status: Married    Spouse name: Not on file  . Number of children: Not on file  . Years of education: Not on file  . Highest education level: 6th grade  Occupational History  . Occupation: Retired  Tobacco Use  . Smoking status: Former Smoker    Types: Cigarettes    Quit date: 09/29/1964    Years since quitting: 55.8  . Smokeless tobacco: Never Used  Vaping Use  . Vaping Use: Never used  Substance and Sexual Activity  . Alcohol use: No    Alcohol/week: 0.0 standard drinks  . Drug use: No  . Sexual activity: Not on file  Other Topics Concern  . Not on file  Social History Narrative  . Not on file   Social Determinants of Health   Financial Resource Strain: Low Risk   . Difficulty of Paying Living Expenses: Not hard at all  Food Insecurity: No Food Insecurity  . Worried About Charity fundraiser in the Last Year: Never true  . Ran Out of Food in the Last Year: Never true  Transportation Needs: No Transportation Needs  . Lack of Transportation (Medical): No  . Lack of Transportation (Non-Medical): No  Physical Activity: Inactive  . Days of Exercise per Week: 0 days  . Minutes of Exercise per Session: 0 min  Stress: No Stress Concern Present  . Feeling of Stress : Not at all  Social Connections: Not on file    Tobacco Counseling Counseling given: Not Answered   Clinical Intake:  Pre-visit preparation completed: Yes  Pain : 0-10 Pain Score: 8  Pain Type: Chronic pain Pain Location: Knee Pain Orientation: Left Pain Descriptors / Indicators: Aching Pain Onset: More than a month ago Pain Frequency: Constant     Nutritional Status: BMI 25 -29 Overweight Nutritional Risks: None Diabetes: Yes  How often do you need to have someone help you when you read instructions, pamphlets, or other written materials from your doctor or pharmacy?: 1 - Never What is the last grade level  you completed in school?: 6th grade  Diabetic? Yes Nutrition Risk Assessment:  Has the patient had any N/V/D within the last 2 months?  No  Does the patient have any non-healing wounds?  No  Has the patient had any unintentional weight loss or weight gain?  No   Diabetes:  Is the patient diabetic?  Yes  If diabetic, was a CBG obtained today?  No  Did the patient bring in their glucometer from home?  No  How often do you monitor your CBG's? daily.   Financial Strains and Diabetes Management:  Are you having any financial strains with the device, your supplies or your medication? No .  Does the patient want to be seen by Chronic Care Management for management of their diabetes?  No  Would the patient like to be referred to a  Nutritionist or for Diabetic Management?  No   Diabetic Exams:  Diabetic Eye Exam: Completed 09/01/2019 Diabetic Foot Exam: Completed 12/18/2019   Interpreter Needed?: No  Information entered by :: NAllen LPN   Activities of Daily Living In your present state of health, do you have any difficulty performing the following activities: 08/02/2020 07/28/2020  Hearing? Tempie Donning  Vision? N N  Difficulty concentrating or making decisions? N N  Walking or climbing stairs? Y N  Dressing or bathing? N N  Doing errands, shopping? N N  Preparing Food and eating ? N -  Using the Toilet? N -  In the past six months, have you accidently leaked urine? Y -  Do you have problems with loss of bowel control? Y -  Managing your Medications? N -  Managing your Finances? N -  Housekeeping or managing your Housekeeping? N -  Some recent data might be hidden    Patient Care Team: Jon Billings, NP as PCP - General [facility] Busing Nobie Putnam, RN as Case Manager (General Practice)  Indicate any recent Medical Services you may have received from other than [facility] providers in the past year (date may be approximate).     Assessment:   This is a routine wellness examination for  Carl Bryan.  Hearing/Vision screen  Hearing Screening   '125Hz'  '250Hz'  '500Hz'  '1000Hz'  '2000Hz'  '3000Hz'  '4000Hz'  '6000Hz'  '8000Hz'   Right ear:           Left ear:           Vision Screening Comments: Regular eye exams,   Dietary issues and exercise activities discussed: Current Exercise Habits: The patient does not participate in regular exercise at present  Goals    .  DIET - INCREASE WATER INTAKE      Recommend continue drinking at least 6-8 glasses of water a day.    .  Patient Stated      08/02/2020, no goals    .  RNCM: Pt-"I like oatmeal cookies" (pt-stated)      CARE PLAN ENTRY (see longtitudinal plan of care for additional care plan information)  Current Barriers:  . Chronic Disease Management support, education, and care coordination needs related to CAD, HTN, HLD, and DMII  Clinical Goal(s) related to CAD, HTN, HLD, and DMII:  Over the next 120 days, patient will:  . Work with the care management team to address educational, disease management, and care coordination needs  . Begin or continue self health monitoring activities as directed today Measure and record cbg (blood glucose) 2 times daily, Measure and record blood pressure 4 times per week, and adhere to a heart healthy/ADA diet . Call provider office for new or worsened signs and symptoms Blood glucose findings outside established parameters, Blood pressure findings outside established parameters, and New or worsened symptom related to CAD, HLD and other chronic health conditions . Call care management team with questions or concerns . Verbalize basic understanding of patient centered plan of care established today  Interventions related to CAD, HTN, HLD, and DMII:  . Evaluation of current treatment plans and patient's adherence to plan as established by provider.  The patient is adherent to the plan of care.  Has his wife to help with his care.  . Assessed patient understanding of disease states.  The patients feels he does a good job  at managing his care.  . Assessed patient's education and care coordination needs.  Denies any needs at this time from the [facility] team pharmacist or LCSW .  Provided disease specific education to patient- education and support on heart healthy/ADA diet. The patient states  he stays away from salt but he will eat a donut or his favorite oatmeal cookies at times. Education on hemoglobin A1C being < 7.0.  Hemoglobin A1C levels in the last 6 months: 8.0 and 7.3.  The patient reports he checks his blood sugars every am and this am it was 120.  The patients wife states that his blood sugar was low this am. He is eating good.  He is enjoying fishing and being outside. He likes to color on his wife's phone.  His wife states that when he gets off track she helps him get back on track.  . Evaluation of safety in the home due to R leg AKA- 1974.  The patient states he has not had any recent falls, tries to be mindful of his surroundings.  Has been through 16 prosthetic legs.  Is currently not using his prosthetic leg due to a fall last year where he injured his right hip.  States he is not having any new concerns related to fall or hip.  Will continue to monitor.  Nash Dimmer with appropriate clinical care team members regarding patient needs  Patient Self Care Activities related to CAD, HTN, HLD, and DMII:  . Patient is unable to independently self-manage chronic health conditions  Please see past updates related to this goal by clicking on the "Past Updates" button in the selected goal        Depression Screen PHQ 2/9 Scores 08/02/2020 07/28/2020 07/31/2019 07/02/2019 07/31/2018 07/30/2017 04/24/2017  PHQ - 2 Score 0 0 0 0 0 0 0    Fall Risk Fall Risk  08/02/2020 07/28/2020 07/31/2019 07/31/2018 02/13/2018  Falls in the past year? 1 1 0 0 1  Comment lost balance - - - -  Number falls in past yr: 0 1 0 - -  Injury with Fall? 0 1 0 - 0  Risk for fall due to : Impaired balance/gait;Impaired mobility;Medication side  effect Impaired mobility - - History of fall(s);Other (Comment);Impaired balance/gait  Follow up Falls evaluation completed;Education provided;Falls prevention discussed Falls evaluation completed - - Falls evaluation completed    FALL RISK PREVENTION PERTAINING TO THE HOME:  Any stairs in or around the home? Yes  If so, are there any without handrails? No  Home free of loose throw rugs in walkways, pet beds, electrical cords, etc? Yes  Adequate lighting in your home to reduce risk of falls? Yes   ASSISTIVE DEVICES UTILIZED TO PREVENT FALLS:  Life alert? No  Use of a cane, walker or w/c? Yes  Grab bars in the bathroom? No  Shower chair or bench in shower? No  Elevated toilet seat or a handicapped toilet? No   TIMED UP AND GO:  Was the test performed? No   Cognitive Function:     6CIT Screen 08/02/2020 07/30/2017  What Year? 0 points 0 points  What month? 0 points 0 points  What time? 0 points 0 points  Count back from 20 0 points 0 points  Months in reverse 4 points 0 points  Repeat phrase 10 points 2 points  Total Score 14 2    Immunizations Immunization History  Administered Date(s) Administered  . Fluad Quad(high Dose 65+) 01/23/2019, 12/18/2019  . Influenza, High Dose Seasonal PF 01/19/2016, 01/17/2017, 01/14/2018  . Influenza,inj,Quad PF,6+ Mos 12/31/2014  . Influenza-Unspecified 02/11/2014  . Moderna Sars-Covid-2 Vaccination 06/11/2019, 07/09/2019  . Pneumococcal Conjugate-13 10/08/2013  .  Pneumococcal Polysaccharide-23 07/06/2015  . Pneumococcal-Unspecified 05/20/2008  . Td 12/02/2003  . Tdap 10/19/2015    TDAP status: Up to date  Flu Vaccine status: Up to date  Pneumococcal vaccine status: Up to date  Covid-19 vaccine status: Completed vaccines  Qualifies for Shingles Vaccine? Yes   Zostavax completed No   Shingrix Completed?: No.    Education has been provided regarding the importance of this vaccine. Patient has been advised to call insurance  company to determine out of pocket expense if they have not yet received this vaccine. Advised may also receive vaccine at local pharmacy or Health Dept. Verbalized acceptance and understanding.  Screening Tests Health Maintenance  Topic Date Due  . COVID-19 Vaccine (3 - Booster for Moderna series) 01/08/2020  . OPHTHALMOLOGY EXAM  08/31/2020  . INFLUENZA VACCINE  11/08/2020  . FOOT EXAM  12/17/2020  . HEMOGLOBIN A1C  01/27/2021  . TETANUS/TDAP  10/18/2025  . Hepatitis C Screening  Completed  . PNA vac Low Risk Adult  Completed  . HPV VACCINES  Aged Out    Health Maintenance  Health Maintenance Due  Topic Date Due  . COVID-19 Vaccine (3 - Booster for Moderna series) 01/08/2020    Colorectal cancer screening: No longer required.   Lung Cancer Screening: (Low Dose CT Chest recommended if Age 68-80 years, 30 pack-year currently smoking OR have quit w/in 15years.) does not qualify.   Lung Cancer Screening Referral: no  Additional Screening:  Hepatitis C Screening: does qualify; Completed 07/06/2015  Vision Screening: Recommended annual ophthalmology exams for early detection of glaucoma and other disorders of the eye. Is the patient up to date with their annual eye exam?  Yes  Who is the provider or what is the name of the office in which the patient attends annual eye exams? Can't remember If pt is not established with a provider, would they like to be referred to a provider to establish care? No .   Dental Screening: Recommended annual dental exams for proper oral hygiene  Community Resource Referral / Chronic Care Management: CRR required this visit?  No   CCM required this visit?  No      Plan:     I have personally reviewed and noted the following in the patient's chart:   . Medical and social history . Use of alcohol, tobacco or illicit drugs  . Current medications and supplements . Functional ability and status . Nutritional status . Physical  activity . Advanced directives . List of other physicians . Hospitalizations, surgeries, and ER visits in previous 12 months . Vitals . Screenings to include cognitive, depression, and falls . Referrals and appointments  In addition, I have reviewed and discussed with patient certain preventive protocols, quality metrics, and best practice recommendations. A written personalized care plan for preventive services as well as general preventive health recommendations were provided to patient.     Kellie Simmering, LPN   [id number]   Nurse Notes:

## 2020-08-02 NOTE — Patient Instructions (Signed)
[name] , Thank you for taking time to come for your Medicare Wellness Visit. I appreciate your ongoing commitment to your health goals. Please review the following plan we discussed and let me know if I can assist you in the future.   Screening recommendations/referrals: Colonoscopy: not required Recommended yearly ophthalmology/optometry visit for glaucoma screening and checkup Recommended yearly dental visit for hygiene and checkup  Vaccinations: Influenza vaccine: completed 12/18/2019, due 11/08/2020 Pneumococcal vaccine: completed 07/06/2015 Tdap vaccine: completed 10/19/2015, due 10/18/2025 Shingles vaccine: discussed   Covid-19:  07/09/2019, 06/11/2019  Advanced directives: Please bring a copy of your POA (Power of Attorney) and/or Living Will to your next appointment.   Conditions/risks identified: none  Next appointment: Follow up in one year for your annual wellness visit.   Preventive Care 1 Years and Older, Male Preventive care refers to lifestyle choices and visits with your health care provider that can promote health and wellness. What does preventive care include?  A yearly physical exam. This is also called an annual well check.  Dental exams once or twice a year.  Routine eye exams. Ask your health care provider how often you should have your eyes checked.  Personal lifestyle choices, including:  Daily care of your teeth and gums.  Regular physical activity.  Eating a healthy diet.  Avoiding tobacco and drug use.  Limiting alcohol use.  Practicing safe sex.  Taking low doses of aspirin every day.  Taking vitamin and mineral supplements as recommended by your health care provider. What happens during an annual well check? The services and screenings done by your health care provider during your annual well check will depend on your age, overall health, lifestyle risk factors, and family history of disease. Counseling  Your health care provider may ask you  questions about your:  Alcohol use.  Tobacco use.  Drug use.  Emotional well-being.  Home and relationship well-being.  Sexual activity.  Eating habits.  History of falls.  Memory and ability to understand (cognition).  Work and work Astronomer. Screening  You may have the following tests or measurements:  Height, weight, and BMI.  Blood pressure.  Lipid and cholesterol levels. These may be checked every 5 years, or more frequently if you are over 65 years old.  Skin check.  Lung cancer screening. You may have this screening every year starting at age 48 if you have a 30-pack-year history of smoking and currently smoke or have quit within the past 15 years.  Fecal occult blood test (FOBT) of the stool. You may have this test every year starting at age 22.  Flexible sigmoidoscopy or colonoscopy. You may have a sigmoidoscopy every 5 years or a colonoscopy every 10 years starting at age 34.  Prostate cancer screening. Recommendations will vary depending on your family history and other risks.  Hepatitis C blood test.  Hepatitis B blood test.  Sexually transmitted disease (STD) testing.  Diabetes screening. This is done by checking your blood sugar (glucose) after you have not eaten for a while (fasting). You may have this done every 1-3 years.  Abdominal aortic aneurysm (AAA) screening. You may need this if you are a current or former smoker.  Osteoporosis. You may be screened starting at age 53 if you are at high risk. Talk with your health care provider about your test results, treatment options, and if necessary, the need for more tests. Vaccines  Your health care provider may recommend certain vaccines, such as:  Influenza vaccine. This is  recommended every year.  Tetanus, diphtheria, and acellular pertussis (Tdap, Td) vaccine. You may need a Td booster every 10 years.  Zoster vaccine. You may need this after age 68.  Pneumococcal 13-valent conjugate  (PCV13) vaccine. One dose is recommended after age 63.  Pneumococcal polysaccharide (PPSV23) vaccine. One dose is recommended after age 9. Talk to your health care provider about which screenings and vaccines you need and how often you need them. This information is not intended to replace advice given to you by your health care provider. Make sure you discuss any questions you have with your health care provider. Document Released: 04/23/2015 Document Revised: 12/15/2015 Document Reviewed: 01/26/2015 Elsevier Interactive Patient Education  2017 [employer] Prevention in the Home Falls can cause injuries. They can happen to people of all ages. There are many things you can do to make your home safe and to help prevent falls. What can I do on the outside of my home?  Regularly fix the edges of walkways and driveways and fix any cracks.  Remove anything that might make you trip as you walk through a door, such as a raised step or threshold.  Trim any bushes or trees on the path to your home.  Use bright outdoor lighting.  Clear any walking paths of anything that might make someone trip, such as rocks or tools.  Regularly check to see if handrails are loose or broken. Make sure that both sides of any steps have handrails.  Any raised decks and porches should have guardrails on the edges.  Have any leaves, snow, or ice cleared regularly.  Use sand or salt on walking paths during winter.  Clean up any spills in your garage right away. This includes oil or grease spills. What can I do in the bathroom?  Use night lights.  Install grab bars by the toilet and in the tub and shower. Do not use towel bars as grab bars.  Use non-skid mats or decals in the tub or shower.  If you need to sit down in the shower, use a plastic, non-slip stool.  Keep the floor dry. Clean up any water that spills on the floor as soon as it happens.  Remove soap buildup in the tub or shower  regularly.  Attach bath mats securely with double-sided non-slip rug tape.  Do not have throw rugs and other things on the floor that can make you trip. What can I do in the bedroom?  Use night lights.  Make sure that you have a light by your bed that is easy to reach.  Do not use any sheets or blankets that are too big for your bed. They should not hang down onto the floor.  Have a firm chair that has side arms. You can use this for support while you get dressed.  Do not have throw rugs and other things on the floor that can make you trip. What can I do in the kitchen?  Clean up any spills right away.  Avoid walking on wet floors.  Keep items that you use a lot in easy-to-reach places.  If you need to reach something above you, use a strong step stool that has a grab bar.  Keep electrical cords out of the way.  Do not use floor polish or wax that makes floors slippery. If you must use wax, use non-skid floor wax.  Do not have throw rugs and other things on the floor that can make you trip.  What can I do with my stairs?  Do not leave any items on the stairs.  Make sure that there are handrails on both sides of the stairs and use them. Fix handrails that are broken or loose. Make sure that handrails are as long as the stairways.  Check any carpeting to make sure that it is firmly attached to the stairs. Fix any carpet that is loose or worn.  Avoid having throw rugs at the top or bottom of the stairs. If you do have throw rugs, attach them to the floor with carpet tape.  Make sure that you have a light switch at the top of the stairs and the bottom of the stairs. If you do not have them, ask someone to add them for you. What else can I do to help prevent falls?  Wear shoes that:  Do not have high heels.  Have rubber bottoms.  Are comfortable and fit you well.  Are closed at the toe. Do not wear sandals.  If you use a stepladder:  Make sure that it is fully  opened. Do not climb a closed stepladder.  Make sure that both sides of the stepladder are locked into place.  Ask someone to hold it for you, if possible.  Clearly mark and make sure that you can see:  Any grab bars or handrails.  First and last steps.  Where the edge of each step is.  Use tools that help you move around (mobility aids) if they are needed. These include:  Canes.  Walkers.  Scooters.  Crutches.  Turn on the lights when you go into a dark area. Replace any light bulbs as soon as they burn out.  Set up your furniture so you have a clear path. Avoid moving your furniture around.  If any of your floors are uneven, fix them.  If there are any pets around you, be aware of where they are.  Review your medicines with your doctor. Some medicines can make you feel dizzy. This can increase your chance of falling. Ask your doctor what other things that you can do to help prevent falls. This information is not intended to replace advice given to you by your health care provider. Make sure you discuss any questions you have with your health care provider. Document Released: 01/21/2009 Document Revised: 09/02/2015 Document Reviewed: 05/01/2014 Elsevier Interactive Patient Education  2017 [employer].

## 2020-08-24 ENCOUNTER — Telehealth: Payer: Self-pay

## 2020-08-24 NOTE — Telephone Encounter (Signed)
Called [facility] to confirm that patient has appointment on 09/09/20 for surgery clearance. Left VM. Received 4th fax about surgery clearance.

## 2020-09-08 NOTE — Progress Notes (Addendum)
BP 138/79   Pulse 75   Temp (!) 97 F (36.1 C)   Ht 5' 5" (1.651 m)   Wt 163 lb (73.9 kg)   SpO2 95%   BMI 27.12 kg/m    Subjective:    Patient ID: Carl Bryan, male    DOB: December 19, 1945, 75 y.o.   MRN: [id number]  HPI: Carl Bryan is a 75 y.o. male  Chief Complaint  Patient presents with   surgical clearance    Left knee June 22   HYPERTENSION / HYPERLIPIDEMIA Satisfied with current treatment? yes Duration of hypertension: years BP monitoring frequency: not checking BP range:  BP medication side effects: no Past BP meds: amlodipine and losartan (cozaar) Duration of hyperlipidemia: years Cholesterol medication side effects: no Cholesterol supplements: none Past cholesterol medications: rosuvastatin (crestor) Medication compliance: excellent compliance Aspirin: no Recent stressors: no Recurrent headaches: no Visual changes: no Palpitations: no Dyspnea: no Chest pain: no Lower extremity edema: no Dizzy/lightheaded: no  DIABETES Hypoglycemic episodes:no Polydipsia/polyuria: no Visual disturbance: no Chest pain: no Paresthesias: no Glucose Monitoring: yes  Accucheck frequency: Daily  Fasting glucose:120-170  Post prandial:  Evening:  Before meals: Taking Insulin?: yes  Long acting insulin:  Short acting insulin: Blood Pressure Monitoring: not checking Retinal Examination: Up to Date Foot Exam: Up to Date Diabetic Education: Not Completed Pneumovax: Up to Date Influenza: Up to Date Aspirin: no  PRE OP: Total Knee Replacement on Left Knee.      Type of Surgery: Intermediate, 1% - 5% cardiac risk (major intraabdominal, intrathoracic, orthopedic, major head & neck, prostatectomy)   Lee's Revised Cardiac Index: 0 Risk class I, very low, 0.4% risk of cardiac complications   Plan:   1. Patient requires endocarditis prophylaxis: no. 2. GENERAL PREOP INSTRUCTIONS: Proceed with surgery as planned. No food or liquids the morning of surgery. Call surgeon  if develops respiratory illness, fever, or other illness. 3. Medications to Hold: NSAIDS for 7 days before surgery, unless instructed otherwise by surgeon. 4. EKG:  09/09/2020: ST changes- Patient was cleared by Cardiology.  5. Ordered Labs: CBC, CMP, A1c, Lipid, UA   From a medical standpoint the patient is an acceptable candidates to undergo general anesthesia for surgery.  It is recommended to correct electrolytes and to keep the patient euvolemic and avoid significant fluid shifts during surgery.  Pt denies a hx of adverse reactions to anesthesia.   Subjective:   Carl Bryan is a 75 year old male who presents to the office today for a preoperative consultation for a Left Total Knee Replacement on June 22 with   Preoperative Risk Factors   1. Major predictors that require intensive management and may lead to delay in or cancellation of the operative procedure unless emergent:   No Unstable coronary syndromes including unstable or severe angina or recent MI   No Decompensated heart failure including NYHA functional class 4 or worsening or new onset HF   No Significant arrhythmia including high grade AV block, symptomatic ventricular arrhythmias, supraventricular arrhythmias with a ventricular rate >100 bpm at rest, symptomatic bradycardia, and newly recognized ventricular tachycardia   No Severe heart valve disease including severe aortic stenosis or symptomatic mitral stenosis   2.  Additional independent predictors of major cardiac complications:   No Hgh risk type of surgery (Vascular surgery and any open intraperitoneal or intrathoracic procedures)   Other clinical predictors that warrant careful assessment of current status   No History of ischemic heart disease No History of  CVA No History of compensated heart failure or prior heart failure No Diabetes mellitus on Insulin No Renal insufficiency   3. Perioperative cardiac and long term risk is increased in pts unable to meet a  4-MET demand during most normal daily activities:   Yes Ability to climb 2 flights of stairs or walk four blocks  Relevant past medical, surgical, family and social history reviewed and updated as indicated. Interim medical history since our last visit reviewed. Allergies and medications reviewed and updated.  Review of Systems  Eyes: Negative for visual disturbance.  Respiratory: Negative for chest tightness and shortness of breath.   Cardiovascular: Negative for chest pain, palpitations and leg swelling.  Endocrine: Negative for polydipsia and polyuria.  Musculoskeletal:       Left knee pain  Neurological: Negative for dizziness, light-headedness, numbness and headaches.    Per HPI unless specifically indicated above     Objective:    BP 138/79   Pulse 75   Temp (!) 97 F (36.1 C)   Ht 5' 5" (1.651 m)   Wt 163 lb (73.9 kg)   SpO2 95%   BMI 27.12 kg/m   Wt Readings from Last 3 Encounters:  09/09/20 163 lb (73.9 kg)  08/02/20 164 lb (74.4 kg)  07/28/20 164 lb (74.4 kg)    Physical Exam Vitals and nursing note reviewed.  Constitutional:      General: He is not in acute distress.    Appearance: Normal appearance. He is not ill-appearing, toxic-appearing or diaphoretic.  HENT:     Head: Normocephalic.     Right Ear: Tympanic membrane, ear canal and external ear normal.     Left Ear: Tympanic membrane, ear canal and external ear normal.     Nose: Nose normal. No congestion or rhinorrhea.     Mouth/Throat:     Mouth: Mucous membranes are moist.  Eyes:     General:        Right eye: No discharge.        Left eye: No discharge.     Extraocular Movements: Extraocular movements intact.     Conjunctiva/sclera: Conjunctivae normal.     Pupils: Pupils are equal, round, and reactive to light.  Cardiovascular:     Rate and Rhythm: Normal rate and regular rhythm.     Heart sounds: No murmur heard.   Pulmonary:     Effort: Pulmonary effort is normal. No respiratory  distress.     Breath sounds: Normal breath sounds. No wheezing, rhonchi or rales.  Abdominal:     General: Abdomen is flat. Bowel sounds are normal. There is no distension.     Palpations: Abdomen is soft.     Tenderness: There is no abdominal tenderness. There is no guarding.  Musculoskeletal:     Cervical back: Normal range of motion and neck supple.     Comments: Uses crutches. Has had a BKA.  Skin:    General: Skin is warm and dry.     Capillary Refill: Capillary refill takes less than 2 seconds.  Neurological:     General: No focal deficit present.     Mental Status: He is alert and oriented to person, place, and time.     Cranial Nerves: No cranial nerve deficit.     Motor: No weakness.     Deep Tendon Reflexes: Reflexes normal.  Psychiatric:        Mood and Affect: Mood normal.        Behavior: Behavior normal.  Thought Content: Thought content normal.        Judgment: Judgment normal.      Assessment & Plan:   Problem List Items Addressed This Visit       Cardiovascular and Mediastinum   Hypertension associated with diabetes ([facility])    Chronic. Under good control on current regimen. Continue current regimen. Recommend monitoring blood pressures at home.  Call with any concerns. Labs drawn today.       Relevant Orders   Comp Met (CMET) (Completed)   CBC w/Diff (Completed)   HgB A1c (Completed)   Lipid Profile (Completed)   Urinalysis, Routine w reflex microscopic (Completed)   CAD (coronary artery disease)    Chronic.  Under good control. Labs ordered today.  Call with concerns.      Aortic atherosclerosis ([facility])    Chronic.  Under good control on current regimen. Continue current regimen. Continue to monitor. Call with any concerns. Refills given. Labs drawn today.      Relevant Orders   Comp Met (CMET) (Completed)   CBC w/Diff (Completed)   HgB A1c (Completed)   Lipid Profile (Completed)   Urinalysis, Routine w reflex microscopic (Completed)      Digestive   GERD (gastroesophageal reflux disease)    Chronic. Under good control on current regimen. Continue current regimen. Continue to monitor. Call with any concerns.  Labs drawn today.        Endocrine   Hyperlipidemia associated with type 2 diabetes mellitus ([facility])    Chronic. Under good control on current regimen. Continue current regimen. Continue to monitor. Call with any concerns.  Labs drawn today.      Relevant Orders   Comp Met (CMET) (Completed)   CBC w/Diff (Completed)   HgB A1c (Completed)   Lipid Profile (Completed)   Urinalysis, Routine w reflex microscopic (Completed)   Diabetes mellitus associated with hormonal etiology ([facility])    Chronic.  A1c increased to 8.4.  Recommend he start Jardiance to help with A1c control. Follow up in 3 months for reevaluation.      Relevant Orders   Comp Met (CMET) (Completed)   CBC w/Diff (Completed)   HgB A1c (Completed)   Lipid Profile (Completed)   Urinalysis, Routine w reflex microscopic (Completed)     Other   Vitamin D deficiency    Chronic.  Stable.  Labs ordered today.  Will make further recommendations based on lab results.       Relevant Orders   Vitamin D (25 hydroxy) (Completed)   B12 deficiency    Chronic.  Stable.  Labs ordered today.  Will make further recommendations based on lab results.       Relevant Orders   Vitamin B12 (Completed)   History of smoking    Stopped smoking in 1966.  Does not current use Tobacco. Will reassess at future visits.         Other Visit Diagnoses     Pre-op evaluation    -  Primary   EKG, UA, and Labs obtained during visit. Patient will need Cardiology clearance due to new ST depression noted on EKG.  Referral placed.   Relevant Orders   EKG 12-Lead (Completed)   Ambulatory referral to Cardiology   ST segment depression       Relevant Orders   Ambulatory referral to Cardiology   Abnormal urinalysis       Relevant Orders   Urine Culture        Follow up  plan: Return  if symptoms worsen or fail to improve.

## 2020-09-09 ENCOUNTER — Ambulatory Visit (INDEPENDENT_AMBULATORY_CARE_PROVIDER_SITE_OTHER): Payer: Medicare Other | Admitting: Nurse Practitioner

## 2020-09-09 ENCOUNTER — Encounter: Payer: Self-pay | Admitting: Nurse Practitioner

## 2020-09-09 ENCOUNTER — Other Ambulatory Visit: Payer: Self-pay

## 2020-09-09 VITALS — BP 138/79 | HR 75 | Temp 97.0°F | Ht 65.0 in | Wt 163.0 lb

## 2020-09-09 DIAGNOSIS — R9431 Abnormal electrocardiogram [ECG] [EKG]: Secondary | ICD-10-CM

## 2020-09-09 DIAGNOSIS — I700 Atherosclerosis of aorta: Secondary | ICD-10-CM | POA: Diagnosis not present

## 2020-09-09 DIAGNOSIS — Z01818 Encounter for other preprocedural examination: Secondary | ICD-10-CM | POA: Diagnosis not present

## 2020-09-09 DIAGNOSIS — E538 Deficiency of other specified B group vitamins: Secondary | ICD-10-CM

## 2020-09-09 DIAGNOSIS — E1169 Type 2 diabetes mellitus with other specified complication: Secondary | ICD-10-CM | POA: Diagnosis not present

## 2020-09-09 DIAGNOSIS — K219 Gastro-esophageal reflux disease without esophagitis: Secondary | ICD-10-CM

## 2020-09-09 DIAGNOSIS — Z87891 Personal history of nicotine dependence: Secondary | ICD-10-CM

## 2020-09-09 DIAGNOSIS — I2583 Coronary atherosclerosis due to lipid rich plaque: Secondary | ICD-10-CM

## 2020-09-09 DIAGNOSIS — E559 Vitamin D deficiency, unspecified: Secondary | ICD-10-CM

## 2020-09-09 DIAGNOSIS — E1159 Type 2 diabetes mellitus with other circulatory complications: Secondary | ICD-10-CM

## 2020-09-09 DIAGNOSIS — R8290 Unspecified abnormal findings in urine: Secondary | ICD-10-CM

## 2020-09-09 DIAGNOSIS — E785 Hyperlipidemia, unspecified: Secondary | ICD-10-CM

## 2020-09-09 DIAGNOSIS — I152 Hypertension secondary to endocrine disorders: Secondary | ICD-10-CM

## 2020-09-09 DIAGNOSIS — I2510 Atherosclerotic heart disease of native coronary artery without angina pectoris: Secondary | ICD-10-CM

## 2020-09-09 LAB — MICROSCOPIC EXAMINATION
Epithelial Cells (non renal): NONE SEEN /hpf (ref 0–10)
WBC, UA: NONE SEEN /hpf (ref 0–5)

## 2020-09-09 LAB — URINALYSIS, ROUTINE W REFLEX MICROSCOPIC
Bilirubin, UA: NEGATIVE
Ketones, UA: NEGATIVE
Leukocytes,UA: NEGATIVE
Nitrite, UA: NEGATIVE
Specific Gravity, UA: 1.015 (ref 1.005–1.030)
Urobilinogen, Ur: 0.2 mg/dL (ref 0.2–1.0)
pH, UA: 5.5 (ref 5.0–7.5)

## 2020-09-09 NOTE — Patient Instructions (Signed)
Stop all NSAIDS including MOBIC (meloxicam) 7 days prior to surgery.

## 2020-09-09 NOTE — Assessment & Plan Note (Signed)
Chronic.  Under good control on current regimen. Continue current regimen. Continue to monitor. Call with any concerns. Labs drawn today.  

## 2020-09-09 NOTE — Assessment & Plan Note (Signed)
Chronic.  Stable.  Labs ordered today.  Will make further recommendations based on lab results.

## 2020-09-09 NOTE — Assessment & Plan Note (Signed)
Chronic.  Under good control on current regimen. Continue current regimen. Continue to monitor. Call with any concerns. Refills given. Labs drawn today.  

## 2020-09-09 NOTE — Assessment & Plan Note (Signed)
Chronic.  Stable.  Labs ordered today.  Will make further recommendations based on lab results.  

## 2020-09-09 NOTE — Assessment & Plan Note (Signed)
Chronic.  Under good control. Labs ordered today.  Call with concerns.

## 2020-09-09 NOTE — Assessment & Plan Note (Signed)
Stopped smoking in 1966.  Does not current use Tobacco. Will reassess at future visits.

## 2020-09-09 NOTE — Progress Notes (Signed)
Results discussed with patient in office today. Will refer to Cardiology due to newly noted St depression.  Patient will need Cardiology clearance prior to being cleared for surgery.

## 2020-09-09 NOTE — Assessment & Plan Note (Addendum)
Chronic.  A1c increased to 8.4.  Recommend he start Jardiance to help with A1c control. Follow up in 3 months for reevaluation.

## 2020-09-09 NOTE — Assessment & Plan Note (Signed)
Chronic. Under good control on current regimen. Continue current regimen. Recommend monitoring blood pressures at home.  Call with any concerns. Labs drawn today.

## 2020-09-10 ENCOUNTER — Other Ambulatory Visit: Payer: Self-pay | Admitting: Nurse Practitioner

## 2020-09-10 DIAGNOSIS — E1169 Type 2 diabetes mellitus with other specified complication: Secondary | ICD-10-CM

## 2020-09-10 LAB — COMPREHENSIVE METABOLIC PANEL
ALT: 32 IU/L (ref 0–44)
AST: 18 IU/L (ref 0–40)
Albumin/Globulin Ratio: 2.1 (ref 1.2–2.2)
Albumin: 4.6 g/dL (ref 3.7–4.7)
Alkaline Phosphatase: 75 IU/L (ref 44–121)
BUN/Creatinine Ratio: 14 (ref 10–24)
BUN: 15 mg/dL (ref 8–27)
Bilirubin Total: 0.8 mg/dL (ref 0.0–1.2)
CO2: 20 mmol/L (ref 20–29)
Calcium: 9.9 mg/dL (ref 8.6–10.2)
Chloride: 99 mmol/L (ref 96–106)
Creatinine, Ser: 1.1 mg/dL (ref 0.76–1.27)
Globulin, Total: 2.2 g/dL (ref 1.5–4.5)
Glucose: 180 mg/dL — ABNORMAL HIGH (ref 65–99)
Potassium: 4.2 mmol/L (ref 3.5–5.2)
Sodium: 137 mmol/L (ref 134–144)
Total Protein: 6.8 g/dL (ref 6.0–8.5)
eGFR: 70 mL/min/{1.73_m2} (ref >59)

## 2020-09-10 LAB — CBC WITH DIFFERENTIAL/PLATELET
Basophils Absolute: 0.1 10*3/uL (ref 0.0–0.2)
Basos: 1 %
EOS (ABSOLUTE): 0.2 10*3/uL (ref 0.0–0.4)
Eos: 2 %
Hematocrit: 44.8 % (ref 37.5–51.0)
Hemoglobin: 15.6 g/dL (ref 13.0–17.7)
Immature Grans (Abs): 0 10*3/uL (ref 0.0–0.1)
Immature Granulocytes: 1 %
Lymphocytes Absolute: 1.3 10*3/uL (ref 0.7–3.1)
Lymphs: 15 %
MCH: 31.6 pg (ref 26.6–33.0)
MCHC: 34.8 g/dL (ref 31.5–35.7)
MCV: 91 fL (ref 79–97)
Monocytes Absolute: 0.5 10*3/uL (ref 0.1–0.9)
Monocytes: 6 %
Neutrophils Absolute: 6.7 10*3/uL (ref 1.4–7.0)
Neutrophils: 75 %
Platelets: 193 10*3/uL (ref 150–450)
RBC: 4.94 x10E6/uL (ref 4.14–5.80)
RDW: 13.2 % (ref 11.6–15.4)
WBC: 8.8 10*3/uL (ref 3.4–10.8)

## 2020-09-10 LAB — VITAMIN D 25 HYDROXY (VIT D DEFICIENCY, FRACTURES): Vit D, 25-Hydroxy: 24.2 ng/mL — ABNORMAL LOW (ref 30.0–100.0)

## 2020-09-10 LAB — LIPID PANEL
Chol/HDL Ratio: 3.1 ratio (ref 0.0–5.0)
Cholesterol, Total: 113 mg/dL (ref 100–199)
HDL: 37 mg/dL — ABNORMAL LOW (ref >39)
LDL Chol Calc (NIH): 55 mg/dL (ref 0–99)
Triglycerides: 118 mg/dL (ref 0–149)
VLDL Cholesterol Cal: 21 mg/dL (ref 5–40)

## 2020-09-10 LAB — HEMOGLOBIN A1C
Est. average glucose Bld gHb Est-mCnc: 189 mg/dL
Hgb A1c MFr Bld: 8.2 % — ABNORMAL HIGH (ref 4.8–5.6)

## 2020-09-10 LAB — VITAMIN B12: Vitamin B-12: >2000 pg/mL — ABNORMAL HIGH (ref 232–1245)

## 2020-09-10 MED ORDER — GLIPIZIDE 5 MG PO TABS: 5.0000 mg | ORAL_TABLET | Freq: Every day | ORAL | 1 refills | Status: DC

## 2020-09-10 NOTE — Telephone Encounter (Signed)
Medication Refill - Medication: glipiZIDE (GLUCOTROL) 5 MG tablet    Preferred Pharmacy (with phone number or street name): [facility] - [place], [place] - [address], [address]  Agent: Please be advised that RX refills may take up to 3 business days. We ask that you follow-up with your pharmacy.

## 2020-09-10 NOTE — Progress Notes (Signed)
Please let Carl Bryan know that his lab work shows that his A1c increased again to 8.2.  Please find out of patient start jardiance after his last visit? If not, is he okay with starting it now?  Cholesterol is well controlled. Complete blood count, liver, kidneys, and electrolytes all look good. Vitamin B 12 is elevated due to him taking a supplement.  Vitamin D remains low.  Continue with supplement.   His urine was abnormal with glucose, protein and red blood cells so I have sent it for culture.    Please remind patient that he needs to see Cardiology prior to surgery in order for me to clear him.

## 2020-09-13 ENCOUNTER — Other Ambulatory Visit: Payer: Self-pay

## 2020-09-13 ENCOUNTER — Ambulatory Visit (INDEPENDENT_AMBULATORY_CARE_PROVIDER_SITE_OTHER): Payer: Medicare Other | Admitting: Cardiology

## 2020-09-13 ENCOUNTER — Encounter: Payer: Self-pay | Admitting: Cardiology

## 2020-09-13 VITALS — BP 118/60 | HR 82 | Ht 65.0 in | Wt 163.0 lb

## 2020-09-13 DIAGNOSIS — I10 Essential (primary) hypertension: Secondary | ICD-10-CM | POA: Diagnosis not present

## 2020-09-13 DIAGNOSIS — Z01818 Encounter for other preprocedural examination: Secondary | ICD-10-CM

## 2020-09-13 DIAGNOSIS — E7800 Pure hypercholesterolemia, unspecified: Secondary | ICD-10-CM

## 2020-09-13 LAB — URINE CULTURE

## 2020-09-13 NOTE — Progress Notes (Signed)
Cardiology Office Note:    Date:  09/13/2020   ID:  [name], DOB 1945-06-27, MRN [id number]  PCP:  Jon Billings, NP   [facility] Providers Cardiologist:  Kate Sable, MD     Referring MD: Jon Billings, NP   Chief Complaint  Patient presents with  . New Patient (Initial Visit)    Referred for pre op clearance - Meds reviewed verbally with patient.     History of Present Illness:    Carl Bryan is a 75 y.o. male with a hx of hypertension, hyperlipidemia, diabetes who presents for preop evaluation.  Patient has a history of left knee arthritis, surgery is being planned.  Denies chest pain, shortness of breath.  Right AKA noted, walks with a walker.  Endorses left knee pain due to bone-on-bone.  Denies any history of heart disease.  Echocardiogram obtained 02/2019 showed normal systolic function, EF 61%.  Past Medical History:  Diagnosis Date  . Bleeding ulcer   . Bronchospasm   . Diabetes mellitus without complication (HCC)    Type 2  . Diverticulosis   . Pure hypercholesterolemia   . Traumatic amputation of leg above knee [facility])     Past Surgical History:  Procedure Laterality Date  . ABOVE KNEE LEG AMPUTATION  1971  . APPENDECTOMY    . KNEE SURGERY     x 2.    Current Medications: Current Meds  Medication Sig  . ACCU-CHEK AVIVA PLUS test strip Check 1 time a day  . allopurinol (ZYLOPRIM) 300 MG tablet Take 1 tablet (300 mg total) by mouth daily.  Marland Kitchen amLODipine (NORVASC) 10 MG tablet Take 1 tablet (10 mg total) by mouth daily.  . blood glucose meter kit and supplies KIT To check blood sugar twice a day. (FOR ICD-9 250.00, 250.01).  . Blood Glucose Monitoring Suppl (ACCU-CHEK AVIVA PLUS) w/Device KIT Test 1 time daily  . glipiZIDE (GLUCOTROL) 5 MG tablet Take 1 tablet (5 mg total) by mouth daily before breakfast.  . losartan (COZAAR) 100 MG tablet Take 1 tablet (100 mg total) by mouth daily.  . meloxicam (MOBIC) 15 MG tablet Take 1 tablet (15 mg  total) by mouth daily.  . metFORMIN (GLUCOPHAGE) 500 MG tablet Take 2 tablets (1,000 mg total) by mouth 2 (two) times daily with a meal.  . pantoprazole (PROTONIX) 40 MG tablet TAKE 1 TABLET 2 TIMES DAILY BEFORE A MEAL.  . rosuvastatin (CRESTOR) 40 MG tablet Take 1 tablet (40 mg total) by mouth daily.  . tamsulosin (FLOMAX) 0.4 MG CAPS capsule Take 1 capsule (0.4 mg total) by mouth daily.  . vitamin B-12 (CYANOCOBALAMIN) 1000 MCG tablet Take 1 tablet (1,000 mcg total) by mouth daily.  Marland Kitchen VITAMIN D, CHOLECALCIFEROL, PO Take 2,000 Units by mouth.     Allergies:   Doxycycline calcium, Tussionex pennkinetic er [hydrocod polst-cpm polst er], and Vytorin [ezetimibe-simvastatin]   Social History   Socioeconomic History  . Marital status: Married    Spouse name: Not on file  . Number of children: Not on file  . Years of education: Not on file  . Highest education level: 6th grade  Occupational History  . Occupation: Retired  Tobacco Use  . Smoking status: Former Smoker    Types: Cigarettes    Quit date: 09/29/1964    Years since quitting: 55.9  . Smokeless tobacco: Never Used  Vaping Use  . Vaping Use: Never used  Substance and Sexual Activity  . Alcohol use: No  Alcohol/week: 0.0 standard drinks  . Drug use: No  . Sexual activity: Not on file  Other Topics Concern  . Not on file  Social History Narrative  . Not on file   Social Determinants of Health   Financial Resource Strain: Low Risk   . Difficulty of Paying Living Expenses: Not hard at all  Food Insecurity: No Food Insecurity  . Worried About Charity fundraiser in the Last Year: Never true  . Ran Out of Food in the Last Year: Never true  Transportation Needs: No Transportation Needs  . Lack of Transportation (Medical): No  . Lack of Transportation (Non-Medical): No  Physical Activity: Inactive  . Days of Exercise per Week: 0 days  . Minutes of Exercise per Session: 0 min  Stress: No Stress Concern Present  .  Feeling of Stress : Not at all  Social Connections: Not on file     Family History: The patient's family history includes Cancer in his mother; Lung disease in his father.  ROS:   Please see the history of present illness.     All other systems reviewed and are negative.  EKGs/Labs/Other Studies Reviewed:    The following studies were reviewed today:   EKG:  EKG is  ordered today.  The ekg ordered today demonstrates normal sinus rhythm  Recent Labs: 12/18/2019: Magnesium 1.7 07/28/2020: TSH 3.200 09/09/2020: ALT 32; BUN 15; Creatinine, Ser 1.10; Hemoglobin 15.6; Platelets 193; Potassium 4.2; Sodium 137  Recent Lipid Panel    Component Value Date/Time   CHOL 113 09/09/2020 1024   CHOL 122 03/28/2018 1052   TRIG 118 09/09/2020 1024   TRIG 232 (H) 03/28/2018 1052   HDL 37 (L) 09/09/2020 1024   CHOLHDL 3.1 09/09/2020 1024   VLDL 46 (H) 03/28/2018 1052   LDLCALC 55 09/09/2020 1024     Risk Assessment/Calculations:      Physical Exam:    VS:  BP 118/60 (BP Location: Right Arm, Patient Position: Sitting, Cuff Size: Normal)   Pulse 82   Ht '5\' 5"'  (1.651 m)   Wt 163 lb (73.9 kg)   SpO2 95%   BMI 27.12 kg/m     Wt Readings from Last 3 Encounters:  09/13/20 163 lb (73.9 kg)  09/09/20 163 lb (73.9 kg)  08/02/20 164 lb (74.4 kg)     GEN:  Well nourished, well developed in no acute distress HEENT: Normal NECK: No JVD; No carotid bruits LYMPHATICS: No lymphadenopathy CARDIAC: RRR, no murmurs, rubs, gallops RESPIRATORY:  Clear to auscultation without rales, diminished breath sounds at bases ABDOMEN: Soft, non-tender, non-distended MUSCULOSKELETAL:  No edema; right AKA noted SKIN: Warm and dry NEUROLOGIC:  Alert and oriented x 3 PSYCHIATRIC:  Normal affect   ASSESSMENT:    1. Pre-op evaluation   2. Primary hypertension   3. Pure hypercholesterolemia    PLAN:    In order of problems listed above:  1. Preop evaluation, left knee arthritis.  Patient not able to  ambulate due to right AKA.  Will obtain Lexiscan Myoview.  Previous echo on 02/2019 with preserved ejection fraction.  If stress test is normal, okay to proceed with surgery from a cardiac perspective. 2. Hypertension, BP controlled.  Continue current BP meds. 3. Hyperlipidemia, cholesterol controlled.  Continue statin.  Follow-up pending stress test results.   Shared Decision Making/Informed Consent The risks [chest pain, shortness of breath, cardiac arrhythmias, dizziness, blood pressure fluctuations, myocardial infarction, stroke/transient ischemic attack, nausea, vomiting, allergic reaction, radiation exposure, metallic  taste sensation and life-threatening complications (estimated to be 1 in 10,000)], benefits (risk stratification, diagnosing coronary artery disease, treatment guidance) and alternatives of a nuclear stress test were discussed in detail with Mr. Diebold and he agrees to proceed.    Medication Adjustments/Labs and Tests Ordered: Current medicines are reviewed at length with the patient today.  Concerns regarding medicines are outlined above.  Orders Placed This Encounter  Procedures  . NM Myocar Multi W/Spect W/Wall Motion / EF  . EKG 12-Lead   No orders of the defined types were placed in this encounter.   Patient Instructions  Medication Instructions:  - Your physician recommends that you continue on your current medications as directed. Please refer to the Current Medication list given to you today.  *If you need a refill on your cardiac medications before your next appointment, please call your pharmacy*   Lab Work: - none ordered  If you have labs (blood work) drawn today and your tests are completely normal, you will receive your results only by: Marland Kitchen MyChart Message (if you have MyChart) OR . A paper copy in the mail If you have any lab test that is abnormal or we need to change your treatment, we will call you to review the  results.   Testing/Procedures:  1) Lexiscan Myoview (Chemical Stress Test/ Cardiac Nuclear Scan)  - Your physician has requested that you have a lexiscan myoview.   [place]  Your caregiver has ordered a Stress Test with nuclear imaging. The purpose of this test is to evaluate the blood supply to your heart muscle. This procedure is referred to as a "Non-Invasive Stress Test." This is because other than having an IV started in your vein, nothing is inserted or "invades" your body. Cardiac stress tests are done to find areas of poor blood flow to the heart by determining the extent of coronary artery disease (CAD). Some patients exercise on a treadmill, which naturally increases the blood flow to your heart, while others who are  unable to walk on a treadmill due to physical limitations have a pharmacologic/chemical stress agent called Lexiscan . This medicine will mimic walking on a treadmill by temporarily increasing your coronary blood flow.   Please note: these test may take anywhere between 2-4 hours to complete  PLEASE REPORT TO [facility] AT THE FIRST DESK WILL DIRECT YOU WHERE TO GO  Date of Procedure:_____________________________________  Arrival Time for Procedure:______________________________  Instructions regarding medication:   __x__ : Hold diabetes (GLIPIZIDE/ METFORMIN) medication morning of procedure  __x__:  You may take all of your other regular morning medications with enough water to get them down safely the morning of your test  PLEASE NOTIFY THE OFFICE AT LEAST 24 HOURS IN ADVANCE IF YOU ARE UNABLE TO KEEP YOUR APPOINTMENT.  [phone] AND  PLEASE NOTIFY NUCLEAR MEDICINE AT [facility] AT LEAST 24 HOURS IN ADVANCE IF YOU ARE UNABLE TO KEEP YOUR APPOINTMENT. [phone]  How to prepare for your Myoview test:  4. Do not eat or drink after midnight 5. No caffeine for 24 hours prior to test 6. No smoking 24 hours prior to  test. 7. Your medication may be taken with water.  If your doctor stopped a medication because of this test, do not take that medication. 8. Ladies, please do not wear dresses.  Skirts or pants are appropriate. Please wear a short sleeve shirt. 9. No perfume, cologne or lotion. 10. Wear comfortable walking shoes. No heels!  Follow-Up: At [facility], you and your health needs are our priority.  As part of our continuing mission to provide you with exceptional heart care, we have created designated Provider Care Teams.  These Care Teams include your primary Cardiologist (physician) and Advanced Practice Providers (APPs -  Physician Assistants and Nurse Practitioners) who all work together to provide you with the care you need, when you need it.  We recommend signing up for the patient portal called "MyChart".  Sign up information is provided on this After Visit Summary.  MyChart is used to connect with patients for Virtual Visits (Telemedicine).  Patients are able to view lab/test results, encounter notes, upcoming appointments, etc.  Non-urgent messages can be sent to your provider as well.   To learn more about what you can do with MyChart, go to [email or web address].    Your next appointment:   As needed pending the results of your stress test   The format for your next appointment:   In Person  Provider:   You may see Kate Sable, MD or one of the following Advanced Practice Providers on your designated Care Team:    Murray Hodgkins, NP  Christell Faith, PA-C  Marrianne Mood, PA-C  Cadence Kathlen Mody, [place]  Laurann Montana, NP    Other Instructions   Cardiac Nuclear Scan A cardiac nuclear scan is a test that is done to check the flow of blood to your heart. It is done when you are resting and when you are exercising. The test looks for problems such as:  Not enough blood reaching a portion of the heart.  The heart muscle not working as it should. You may need this  test if:  You have heart disease.  You have had lab results that are not normal.  You have had heart surgery or a balloon procedure to open up blocked arteries (angioplasty).  You have chest pain.  You have shortness of breath. In this test, a special dye (tracer) is put into your bloodstream. The tracer will travel to your heart. A camera will then take pictures of your heart to see how the tracer moves through your heart. This test is usually done at a hospital and takes 2-4 hours. Tell a doctor about:  Any allergies you have.  All medicines you are taking, including vitamins, herbs, eye drops, creams, and over-the-counter medicines.  Any problems you or family members have had with anesthetic medicines.  Any blood disorders you have.  Any surgeries you have had.  Any medical conditions you have.  Whether you are pregnant or may be pregnant. What are the risks? Generally, this is a safe test. However, problems may occur, such as:  Serious chest pain and heart attack. This is only a risk if the stress portion of the test is done.  Rapid heartbeat.  A feeling of warmth in your chest. This feeling usually does not last long.  Allergic reaction to the tracer. What happens before the test?  Ask your doctor about changing or stopping your normal medicines. This is important.  Follow instructions from your doctor about what you cannot eat or drink.  Remove your jewelry on the day of the test. What happens during the test?  An IV tube will be inserted into one of your veins.  Your doctor will give you a small amount of tracer through the IV tube.  You will wait for 20-40 minutes while the tracer moves through your bloodstream.  Your heart will be monitored  with an electrocardiogram (ECG).  You will lie down on an exam table.  Pictures of your heart will be taken for about 15-20 minutes.  You may also have a stress test. For this test, one of these things may be  done: ? You will be asked to exercise on a treadmill or a stationary bike. ? You will be given medicines that will make your heart work harder. This is done if you are unable to exercise.  When blood flow to your heart has peaked, a tracer will again be given through the IV tube.  After 20-40 minutes, you will get back on the exam table. More pictures will be taken of your heart.  Depending on the tracer that is used, more pictures may need to be taken 3-4 hours later.  Your IV tube will be removed when the test is over. The test may vary among doctors and hospitals. What happens after the test?  Ask your doctor: ? Whether you can return to your normal schedule, including diet, activities, and medicines. ? Whether you should drink more fluids. This will help to remove the tracer from your body. Drink enough fluid to keep your pee (urine) pale yellow.  Ask your doctor, or the department that is doing the test: ? When will my results be ready? ? How will I get my results? Summary  A cardiac nuclear scan is a test that is done to check the flow of blood to your heart.  Tell your doctor whether you are pregnant or may be pregnant.  Before the test, ask your doctor about changing or stopping your normal medicines. This is important.  Ask your doctor whether you can return to your normal activities. You may be asked to drink more fluids. This information is not intended to replace advice given to you by your health care provider. Make sure you discuss any questions you have with your health care provider. Document Revised: 07/17/2018 Document Reviewed: 09/10/2017 Elsevier Patient Education  2021 [employer].      Signed, Kate Sable, MD  09/13/2020 4:38 PM    [place]

## 2020-09-13 NOTE — Patient Instructions (Addendum)
Medication Instructions:  - Your physician recommends that you continue on your current medications as directed. Please refer to the Current Medication list given to you today.  *If you need a refill on your cardiac medications before your next appointment, please call your pharmacy*   Lab Work: - none ordered  If you have labs (blood work) drawn today and your tests are completely normal, you will receive your results only by: Marland Kitchen MyChart Message (if you have MyChart) OR . A paper copy in the mail If you have any lab test that is abnormal or we need to change your treatment, we will call you to review the results.   Testing/Procedures:  1) Lexiscan Myoview (Chemical Stress Test/ Cardiac Nuclear Scan)  - Your physician has requested that you have a lexiscan myoview.   [facility] MYOVIEW  Your caregiver has ordered a Stress Test with nuclear imaging. The purpose of this test is to evaluate the blood supply to your heart muscle. This procedure is referred to as a "Non-Invasive Stress Test." This is because other than having an IV started in your vein, nothing is inserted or "invades" your body. Cardiac stress tests are done to find areas of poor blood flow to the heart by determining the extent of coronary artery disease (CAD). Some patients exercise on a treadmill, which naturally increases the blood flow to your heart, while others who are  unable to walk on a treadmill due to physical limitations have a pharmacologic/chemical stress agent called Lexiscan . This medicine will mimic walking on a treadmill by temporarily increasing your coronary blood flow.   Please note: these test may take anywhere between 2-4 hours to complete  PLEASE REPORT TO [facility]TR[facility]  THE VOLUNTEERS AT THE FIRST DESK WILL DIRECT YOU WHERE TO GO  Date of Procedure:_____________________________________  Arrival Time for Procedure:______________________________  Instructions regarding medication:    __x__ : Hold diabetes (GLIPIZIDE/ METFORMIN) medication morning of procedure  __x__:  You may take all of your other regular morning medications with enough water to get them down safely the morning of your test  PLEASE NOTIFY THE OFFICE AT LEAST 24 HOURS IN ADVANCE IF YOU ARE UNABLE TO KEEP YOUR APPOINTMENT.  [phone] AND  PLEASE NOTIFY NUCLEAR MEDICINE AT [facility] AT LEAST 24 HOURS IN ADVANCE IF YOU ARE UNABLE TO KEEP YOUR APPOINTMENT. [phone]  How to prepare for your Myoview test:  1. Do not eat or drink after midnight 2. No caffeine for 24 hours prior to test 3. No smoking 24 hours prior to test. 4. Your medication may be taken with water.  If your doctor stopped a medication because of this test, do not take that medication. 5. Ladies, please do not wear dresses.  Skirts or pants are appropriate. Please wear a short sleeve shirt. 6. No perfume, cologne or lotion. 7. Wear comfortable walking shoes. No heels!   Follow-Up: At [facility], you and your health needs are our priority.  As part of our continuing mission to provide you with exceptional heart care, we have created designated Provider Care Teams.  These Care Teams include your primary Cardiologist (physician) and Advanced Practice Providers (APPs -  Physician Assistants and Nurse Practitioners) who all work together to provide you with the care you need, when you need it.  We recommend signing up for the patient portal called "MyChart".  Sign up information is provided on this After Visit Summary.  MyChart is used to connect with patients for Virtual Visits (  Telemedicine).  Patients are able to view lab/test results, encounter notes, upcoming appointments, etc.  Non-urgent messages can be sent to your provider as well.   To learn more about what you can do with MyChart, go to [email or web address].    Your next appointment:   As needed pending the results of your stress test   The format for your next  appointment:   In Person  Provider:   You may see Debbe Odea, MD or one of the following Advanced Practice Providers on your designated Care Team:    Nicolasa Ducking, NP  Eula Listen, PA-C  Marisue Ivan, PA-C  Cadence Fransico Michael, [place]  Gillian Shields, NP    Other Instructions   Cardiac Nuclear Scan A cardiac nuclear scan is a test that is done to check the flow of blood to your heart. It is done when you are resting and when you are exercising. The test looks for problems such as:  Not enough blood reaching a portion of the heart.  The heart muscle not working as it should. You may need this test if:  You have heart disease.  You have had lab results that are not normal.  You have had heart surgery or a balloon procedure to open up blocked arteries (angioplasty).  You have chest pain.  You have shortness of breath. In this test, a special dye (tracer) is put into your bloodstream. The tracer will travel to your heart. A camera will then take pictures of your heart to see how the tracer moves through your heart. This test is usually done at a hospital and takes 2-4 hours. Tell a doctor about:  Any allergies you have.  All medicines you are taking, including vitamins, herbs, eye drops, creams, and over-the-counter medicines.  Any problems you or family members have had with anesthetic medicines.  Any blood disorders you have.  Any surgeries you have had.  Any medical conditions you have.  Whether you are pregnant or may be pregnant. What are the risks? Generally, this is a safe test. However, problems may occur, such as:  Serious chest pain and heart attack. This is only a risk if the stress portion of the test is done.  Rapid heartbeat.  A feeling of warmth in your chest. This feeling usually does not last long.  Allergic reaction to the tracer. What happens before the test?  Ask your doctor about changing or stopping your normal medicines. This  is important.  Follow instructions from your doctor about what you cannot eat or drink.  Remove your jewelry on the day of the test. What happens during the test?  An IV tube will be inserted into one of your veins.  Your doctor will give you a small amount of tracer through the IV tube.  You will wait for 20-40 minutes while the tracer moves through your bloodstream.  Your heart will be monitored with an electrocardiogram (ECG).  You will lie down on an exam table.  Pictures of your heart will be taken for about 15-20 minutes.  You may also have a stress test. For this test, one of these things may be done: ? You will be asked to exercise on a treadmill or a stationary bike. ? You will be given medicines that will make your heart work harder. This is done if you are unable to exercise.  When blood flow to your heart has peaked, a tracer will again be given through the IV tube.  After 20-40 minutes,  you will get back on the exam table. More pictures will be taken of your heart.  Depending on the tracer that is used, more pictures may need to be taken 3-4 hours later.  Your IV tube will be removed when the test is over. The test may vary among doctors and hospitals. What happens after the test?  Ask your doctor: ? Whether you can return to your normal schedule, including diet, activities, and medicines. ? Whether you should drink more fluids. This will help to remove the tracer from your body. Drink enough fluid to keep your pee (urine) pale yellow.  Ask your doctor, or the department that is doing the test: ? When will my results be ready? ? How will I get my results? Summary  A cardiac nuclear scan is a test that is done to check the flow of blood to your heart.  Tell your doctor whether you are pregnant or may be pregnant.  Before the test, ask your doctor about changing or stopping your normal medicines. This is important.  Ask your doctor whether you can return to  your normal activities. You may be asked to drink more fluids. This information is not intended to replace advice given to you by your health care provider. Make sure you discuss any questions you have with your health care provider. Document Revised: 07/17/2018 Document Reviewed: 09/10/2017 Elsevier Patient Education  2021 [employer].

## 2020-09-14 ENCOUNTER — Telehealth: Payer: Self-pay

## 2020-09-14 MED ORDER — NITROFURANTOIN MONOHYD MACRO 100 MG PO CAPS: 100.0000 mg | ORAL_CAPSULE | Freq: Two times a day (BID) | ORAL | 0 refills | Status: DC

## 2020-09-14 MED ORDER — DAPAGLIFLOZIN PROPANEDIOL 10 MG PO TABS: 10.0000 mg | ORAL_TABLET | Freq: Every day | ORAL | 1 refills | Status: DC

## 2020-09-14 NOTE — Addendum Note (Signed)
Addended by: Larae Grooms on: 09/14/2020 10:56 AM   Modules accepted: Orders

## 2020-09-14 NOTE — Progress Notes (Signed)
I sent in [place] for [name] instead of Jar[place].  It is the same class of medication but the Marcelline Deist offers more benefits for the kidney and heart over time.

## 2020-09-14 NOTE — Telephone Encounter (Signed)
Copied from CRM [id number]. Topic: General - Call Back - No Documentation >> Sep 14, 2020  2:50 PM Randol Kern wrote: Idalia Needle from [facility] called regarding a fax for pre-operative clearance that they need faxed back now that the patient has been seen.  Scheduled for surgery June 22. The sooner the better, Please advise [phone] ext [phone]

## 2020-09-14 NOTE — Progress Notes (Signed)
Please let patient know that his urine grew a bacteria.  I have sent him Macrobid to the pharmacy.  He needs to complete the course of antibiotics.

## 2020-09-14 NOTE — Addendum Note (Signed)
Addended by: Larae Grooms on: 09/14/2020 09:12 AM   Modules accepted: Orders

## 2020-09-15 ENCOUNTER — Telehealth: Payer: Self-pay | Admitting: Nurse Practitioner

## 2020-09-15 DIAGNOSIS — E1159 Type 2 diabetes mellitus with other circulatory complications: Secondary | ICD-10-CM

## 2020-09-15 NOTE — Telephone Encounter (Signed)
Form in your folder for signature

## 2020-09-15 NOTE — Telephone Encounter (Signed)
Pt wife  Liborio Nixon is calling and the heart medication she does not know the name of  #30 cost 400.00 and #90 cost 500.00. Pt can not afford the medication and would like something else. [facility] [address] [place] phone number [phone]. Pt seen karen on 09-09-2020

## 2020-09-15 NOTE — Telephone Encounter (Signed)
Patients wife notified

## 2020-09-15 NOTE — Telephone Encounter (Signed)
Please let patient know that I am going to put in a referral for our chronic care management team.  The pahrmacist, Raynelle Fanning, Can help [place] with the cost of the medication or finding one that will work for him.

## 2020-09-15 NOTE — Telephone Encounter (Signed)
Patient has to have Cardiology clearance prior to surgery. I am waiting for that to be able to clear him.

## 2020-09-15 NOTE — Telephone Encounter (Signed)
Must be the new diabetic medication

## 2020-09-15 NOTE — Telephone Encounter (Signed)
Returned phone call to [place] from [facility] to make aware of provider note.

## 2020-09-17 ENCOUNTER — Telehealth: Payer: Self-pay

## 2020-09-17 NOTE — Chronic Care Management (AMB) (Signed)
   Chronic Care Management   Note  09/17/2020 Name: Carl Bryan MRN: [id number] DOB: 12-04-1945  Carl Bryan is a 75 y.o. year old male who is a primary care patient of Larae Grooms, NP. Carl Bryan is currently enrolled in care management services. An additional referral for Pharm D was placed.   Follow up plan: Telephone appointment with care management team member scheduled for: 09/20/2020  Penne Lash, RMA Care Guide, Embedded Care Coordination [facility]  [place], [place] [address] Direct Dial: [phone] [email or web address] Website: [email or web address]

## 2020-09-20 ENCOUNTER — Ambulatory Visit (INDEPENDENT_AMBULATORY_CARE_PROVIDER_SITE_OTHER): Payer: Medicare Other | Admitting: Pharmacist

## 2020-09-20 DIAGNOSIS — I152 Hypertension secondary to endocrine disorders: Secondary | ICD-10-CM

## 2020-09-20 DIAGNOSIS — E1169 Type 2 diabetes mellitus with other specified complication: Secondary | ICD-10-CM | POA: Diagnosis not present

## 2020-09-20 DIAGNOSIS — E1159 Type 2 diabetes mellitus with other circulatory complications: Secondary | ICD-10-CM

## 2020-09-20 NOTE — Telephone Encounter (Signed)
Carl Bryan was following up on the clearance form, please advise.

## 2020-09-20 NOTE — Progress Notes (Signed)
Chronic Care Management Pharmacy Note  10/15/2020 Name:  [name] MRN:  [id number] DOB:  Jul 30, 1945  Summary: Uncontrolled DM2 unable to afford SGLT-2 copay.  Recommendations/Changes made from today's visit: Farxiga voucher to be used and apply for PAP.  Plan: CPA to follow up in 4-6 weeks. Patient to return PAP application.   Subjective: Carl Bryan is an 75 y.o. year old male who is a primary patient of Jon Billings, NP.  The CCM team was consulted for assistance with disease management and care coordination needs.    Engaged with patient by telephone for initial visit in response to provider referral for pharmacy case management and/or care coordination services.   Consent to Services:  The patient was given the following information about Chronic Care Management services today, agreed to services, and gave verbal consent: 1. CCM service includes personalized support from designated clinical staff supervised by the primary care provider, including individualized plan of care and coordination with other care providers 2. 24/7 contact phone numbers for assistance for urgent and routine care needs. 3. Service will only be billed when office clinical staff spend 20 minutes or more in a month to coordinate care. 4. Only one practitioner may furnish and bill the service in a calendar month. 5.The patient may stop CCM services at any time (effective at the end of the month) by phone call to the office staff. 6. The patient will be responsible for cost sharing (co-pay) of up to 20% of the service fee (after annual deductible is met). Patient agreed to services and consent obtained.  Patient Care Team: Jon Billings, NP as PCP - General Kate Sable, MD as PCP - Cardiology (Cardiology) Vanita Ingles, RN as Case Manager (General Practice) Vladimir Faster, [facility] (Pharmacist)  Recent office visits: 09/09/20-[place](PCP)- blood work, UA cx E coli, EKG, referral to cards, preop  for TKR, s/p rt AKA  Recent consult visits: 09/13/20- [place] (cards)- preop eval stress test, ekg  Hospital visits: None in previous 6 months   Objective:  Lab Results  Component Value Date   CREATININE 1.10 09/09/2020   BUN 15 09/09/2020   GFRNONAA 77 01/15/2020   GFRAA 89 01/15/2020   NA 137 09/09/2020   K 4.2 09/09/2020   CALCIUM 9.9 09/09/2020   CO2 20 09/09/2020   GLUCOSE 180 (H) 09/09/2020    Lab Results  Component Value Date/Time   HGBA1C 8.2 (H) 09/09/2020 10:24 AM   HGBA1C 7.5 (H) 07/28/2020 10:07 AM   HGBA1C 6.6 03/18/2020 09:27 AM   MICROALBUR 150 (H) 07/28/2020 10:06 AM   MICROALBUR 80 (H) 12/18/2019 09:07 AM    Last diabetic Eye exam:  Lab Results  Component Value Date/Time   HMDIABEYEEXA No Retinopathy 09/01/2019 12:00 AM    Last diabetic Foot exam: No results found for: HMDIABFOOTEX   Lab Results  Component Value Date   CHOL 113 09/09/2020   HDL 37 (L) 09/09/2020   LDLCALC 55 09/09/2020   TRIG 118 09/09/2020   CHOLHDL 3.1 09/09/2020    Hepatic Function Latest Ref Rng & Units 09/09/2020 07/28/2020 04/21/2019  Total Protein 6.0 - 8.5 g/dL 6.8 6.5 6.3  Albumin 3.7 - 4.7 g/dL 4.6 4.5 4.0  AST 0 - 40 IU/L _0 ALT 0 - 44 IU/L 32 29 15  Alk Phosphatase 44 - 121 IU/L 75 68 57  Total Bilirubin 0.0 - 1.2 mg/dL 0.8 0.6 0.7    Lab Results  Component Value Date/Time  TSH 3.200 07/28/2020 10:10 AM   TSH 2.470 12/18/2019 09:17 AM    CBC Latest Ref Rng & Units 09/09/2020 07/28/2020 07/21/2019  WBC 3.4 - 10.8 x10E3/uL 8.8 9.6 10.2  Hemoglobin 13.0 - 17.7 g/dL 15.6 16.0 15.3  Hematocrit 37.5 - 51.0 % 44.8 46.6 44.6  Platelets 150 - 450 x10E3/uL 193 201 247    Lab Results  Component Value Date/Time   VD25OH 24.2 (L) 09/09/2020 10:24 AM   VD25OH 24.5 (L) 07/28/2020 10:10 AM    Clinical ASCVD: Yes  The ASCVD Risk score Mikey Bussing DC Jr., et al., 2013) failed to calculate for the following reasons:   The valid total cholesterol range is 130 to 320  mg/dL    Depression screen [facility] 2/9 08/02/2020 07/28/2020 07/31/2019  Decreased Interest 0 0 0  Down, Depressed, Hopeless 0 0 0  PHQ - 2 Score 0 0 0      Social History   Tobacco Use  Smoking Status Former   Pack years: 0.00   Types: Cigarettes   Quit date: 09/29/1964   Years since quitting: 56.0  Smokeless Tobacco Never   BP Readings from Last 3 Encounters:  09/13/20 118/60  09/09/20 138/79  07/28/20 (!) 149/77   Pulse Readings from Last 3 Encounters:  09/13/20 82  09/09/20 75  07/28/20 64   Wt Readings from Last 3 Encounters:  09/13/20 163 lb (73.9 kg)  09/09/20 163 lb (73.9 kg)  08/02/20 164 lb (74.4 kg)   BMI Readings from Last 3 Encounters:  09/13/20 27.12 kg/m  09/09/20 27.12 kg/m  08/02/20 27.29 kg/m    Assessment/Interventions: Review of patient past medical history, allergies, medications, health status, including review of consultants reports, laboratory and other test data, was performed as part of comprehensive evaluation and provision of chronic care management services.   SDOH:  (Social Determinants of Health) assessments and interventions performed: Yes SDOH Interventions    Flowsheet Row Most Recent Value  SDOH Interventions   Financial Strain Interventions Other (Comment)  [patient assistance]      SDOH Screenings   Alcohol Screen: Not on file  Depression (PHQ2-9): Low Risk    PHQ-2 Score: 0  Financial Resource Strain: Low Risk    Difficulty of Paying Living Expenses: Not hard at all  Food Insecurity: No Food Insecurity   Worried About Charity fundraiser in the Last Year: Never true   Ran Out of Food in the Last Year: Never true  Housing: Not on file  Physical Activity: Inactive   Days of Exercise per Week: 0 days   Minutes of Exercise per Session: 0 min  Social Connections: Not on file  Stress: No Stress Concern Present   Feeling of Stress : Not at all  Tobacco Use: Medium Risk   Smoking Tobacco Use: Former   Smokeless Tobacco  Use: Never  Transportation Needs: No Transportation Needs   Lack of Transportation (Medical): No   Lack of Transportation (Non-Medical): No       Immunization History  Administered Date(s) Administered   Fluad Quad(high Dose 65+) 01/23/2019, 12/18/2019   Influenza, High Dose Seasonal PF 01/19/2016, 01/17/2017, 01/14/2018   Influenza,inj,Quad PF,6+ Mos 12/31/2014   Influenza-Unspecified 02/11/2014   Moderna Sars-Covid-2 Vaccination 06/11/2019, 07/09/2019   Pneumococcal Conjugate-13 10/08/2013   Pneumococcal Polysaccharide-23 07/06/2015   Pneumococcal-Unspecified 05/20/2008   Td 12/02/2003   Tdap 10/19/2015    Conditions to be addressed/monitored:  Hypertension, Hyperlipidemia, Diabetes, Coronary Artery Disease, and Osteoarthritis  Care Plan : Three Way  plan  Updates made by Vladimir Faster, RPH since 10/15/2020 12:00 AM     Problem: DM2, HTN, HLD, AKA 2/2 trauma, osteoarthritis   Priority: High     Long-Range Goal: Disease Management   Start Date: 09/20/2020  This Visit's Progress: Not on track  Priority: High  Note:   Current Barriers:  Unable to independently afford treatment regimen Unable to independently monitor therapeutic efficacy Unable to achieve control of diabetes  Suboptimal therapeutic regimen for diabetes, CAD  Pharmacist Clinical Goal(s):  Patient will verbalize ability to afford treatment regimen achieve adherence to monitoring guidelines and medication adherence to achieve therapeutic efficacy achieve control of diabetes as evidenced by A1c and home BG readings through collaboration with PharmD and provider.   Interventions: 1:1 collaboration with Jon Billings, NP regarding development and update of comprehensive plan of care as evidenced by provider attestation and co-signature Inter-disciplinary care team collaboration (see longitudinal plan of care) Comprehensive medication review performed; medication list updated in electronic  medical record  Hypertension (BP goal <130/80) -Controlled -Current treatment: Amlodipine 10 mg qd Losartan 100 mg qd -Medications previously tried: na  -Current home readings: 130-140/70-80s -Current dietary habits: eats a [employer] biscuit every morning, Likes cabbage -Current exercise habits: none- RT AKA, preparing for Lt TKR -Denies hypotensive/hypertensive symptoms -Educated on BP goals and benefits of medications for prevention of heart attack, stroke and kidney damage; Daily salt intake goal < 2300 mg; Importance of home blood pressure monitoring; -Counseled to monitor BP at home three times weekly, document, and provide log at future appointments -Counseled on diet and exercise extensively Recommended to continue current medication  Hyperlipidemia: (LDL goal < 70) -Controlled -Current treatment: Rosuvastatin 40 mg qd -Medications previously tried: na  --Educated on Cholesterol goals;  Benefits of statin for ASCVD risk reduction; Importance of limiting foods high in cholesterol; -Counseled on diet and exercise extensively Recommended to continue current medication  Lab Results  Component Value Date   CREATININE 1.10 09/09/2020  eGFR~65ml/min  Lab Results  Component Value Date   VITAMINB12 >2000 (H) 09/09/2020   Diabetes (A1c goal <7%) -Uncontrolled -Current medications: Glipizide 5 mg qam Metformin 1000 mg bid -Medications previously tried: ? jardiance  -Current home glucose readings fasting glucose: 200 today  post prandial glucose: can not recall -Denies hypoglycemic/hyperglycemic symptoms --Educated on A1c and blood sugar goals; Complications of diabetes including kidney damage, retinal damage, and cardiovascular disease; Benefits of weight loss; Benefits of routine self-monitoring of blood sugar; Counseled to check feet daily and get yearly eye exams -Counseled to check feet daily and get yearly eye exams -Counseled on diet and exercise  extensively Recommended starting Farxiga 10 mg with voucher and applying for PAP Assessed patient finances. Per wife's report should meet income criteria. Recommended addition of GLP-1 for weight loss and BG lowering.  DC glipizide. Recommended decreasing B12 supplement to every other day and recheck level in 4-6 weekls Patient Goals/Self-Care Activities Patient will:  - take medications as prescribed check glucose daily, document, and provide at future appointments check blood pressure 3 times weekly, document, and provide at future appointments engage in dietary modifications by reducing sodium, decreasing number of days per week eating a biscuit from fast food.  Follow Up Plan: Telephone follow up appointment with care management team member scheduled for:         Medication Assistance: Application for Farxiga  medication assistance program. in process.  Anticipated assistance start date 3-4 weeks after completion of paperwork.  See plan of care  for additional detail.  Compliance/Adherence/Medication fill history: Care Gaps: Shingrix vaccine, eye exam overdue  Star-Rating Drugs: Losartan 100 mg 90DS 4/22//22 Rosuvastatin 40 mg 90DS 08/17/20 Glipizide 5 mg 90DS 08/17/20 Metformin 500 mg 90 DS 07/30/20  Patient's preferred pharmacy is:  [place], [place] - [address] [place] [address] Phone: [phone] Fax: [phone]  [facility]  ([place]) - [place] [place] [address] Phone: [phone] Fax: [phone]  Uses pill box? Yes Pt endorses 90% compliance-- wife manages all his meds.   We discussed: Current pharmacy is preferred with insurance plan and patient is satisfied with pharmacy services Patient decided to: Continue current medication management strategy  Care Plan and Follow Up Patient Decision:  Patient agrees to Care Plan  and Follow-up.  Plan: Telephone follow up appointment with care management team member scheduled for:  6-8 weeks CPA  Junita Push. Kenton Kingfisher PharmD, [facility] [phone]

## 2020-09-21 ENCOUNTER — Telehealth: Payer: Self-pay

## 2020-09-21 NOTE — Telephone Encounter (Signed)
Patient will need Cardiology clearance for surgery.  I have reminded patient multiple times.

## 2020-09-21 NOTE — Telephone Encounter (Signed)
Patient has been cleared by cardiology, do you have the form to fill out?

## 2020-09-21 NOTE — Telephone Encounter (Signed)
Called and left a message for Idalia Needle to return my call.

## 2020-09-21 NOTE — Telephone Encounter (Signed)
Copied from CRM [id number]. Topic: General - Other >> Sep 21, 2020 10:27 AM Gaetana Michaelis A wrote: Reason for CRM: Paige with [facility] has called regarding patient's pre-surgery clearance form   Idalia Needle shares that the form is needed back by 09/28/20 at the latest, the patient is scheduled for surgery on 6/22  The form can be faxed back to [facility] at [phone]  Please contact if needed further

## 2020-09-21 NOTE — Telephone Encounter (Signed)
Documenting in other encounter will close this one.

## 2020-09-22 NOTE — Telephone Encounter (Signed)
Patient has to have stress test done before cleared by Cardiology.  Looks like he has that scheduled for tomorrow.

## 2020-09-23 ENCOUNTER — Ambulatory Visit
Admission: RE | Admit: 2020-09-23 | Discharge: 2020-09-23 | Disposition: A | Discharge status: Home / Self Care | Payer: Medicare Other | Source: Ambulatory Visit | Attending: Cardiology | Admitting: Cardiology

## 2020-09-23 ENCOUNTER — Telehealth: Payer: Self-pay | Admitting: Pharmacist

## 2020-09-23 ENCOUNTER — Other Ambulatory Visit: Payer: Self-pay

## 2020-09-23 DIAGNOSIS — I10 Essential (primary) hypertension: Secondary | ICD-10-CM | POA: Diagnosis not present

## 2020-09-23 DIAGNOSIS — Z01810 Encounter for preprocedural cardiovascular examination: Secondary | ICD-10-CM | POA: Diagnosis not present

## 2020-09-23 DIAGNOSIS — Z01818 Encounter for other preprocedural examination: Secondary | ICD-10-CM

## 2020-09-23 LAB — NM MYOCAR MULTI W/SPECT W/WALL MOTION / EF
LV dias vol: 63 mL (ref 62–150)
LV sys vol: 21 mL
Peak HR: 97 {beats}/min
Percent HR: 67 %
Rest HR: 61 {beats}/min
SDS: 1
SRS: 10
SSS: 2
TID: 0.89

## 2020-09-23 MED ORDER — REGADENOSON 0.4 MG/5ML IV SOLN
0.4000 mg | Freq: Once | INTRAVENOUS | Status: AC
  Administered 2020-09-23: 0.4 mg via INTRAVENOUS

## 2020-09-23 MED ORDER — TECHNETIUM TC 99M TETROFOSMIN IV KIT
10.2800 | PACK | Freq: Once | INTRAVENOUS | Status: AC | PRN
  Administered 2020-09-23: 10.28 via INTRAVENOUS

## 2020-09-23 MED ORDER — TECHNETIUM TC 99M TETROFOSMIN IV KIT
30.0000 | PACK | Freq: Once | INTRAVENOUS | Status: AC | PRN
  Administered 2020-09-23: 33.8 via INTRAVENOUS

## 2020-09-23 NOTE — Chronic Care Management (AMB) (Signed)
    Chronic Care Management Pharmacy Assistant   Name: JERAMIAH MCCAUGHEY  MRN: [id number] DOB: 03/01/1946   Reason for Encounter: Patient Assistance application Farxiga    Medications: Outpatient Encounter Medications as of 09/23/2020  Medication Sig   ACCU-CHEK AVIVA PLUS test strip Check 1 time a day   allopurinol (ZYLOPRIM) 300 MG tablet Take 1 tablet (300 mg total) by mouth daily.   amLODipine (NORVASC) 10 MG tablet Take 1 tablet (10 mg total) by mouth daily.   blood glucose meter kit and supplies KIT To check blood sugar twice a day. (FOR ICD-9 250.00, 250.01).   Blood Glucose Monitoring Suppl (ACCU-CHEK AVIVA PLUS) w/Device KIT Test 1 time daily   dapagliflozin propanediol (FARXIGA) 10 MG TABS tablet Take 1 tablet (10 mg total) by mouth daily before breakfast.   glipiZIDE (GLUCOTROL) 5 MG tablet Take 1 tablet (5 mg total) by mouth daily before breakfast.   losartan (COZAAR) 100 MG tablet Take 1 tablet (100 mg total) by mouth daily.   meloxicam (MOBIC) 15 MG tablet Take 1 tablet (15 mg total) by mouth daily.   metFORMIN (GLUCOPHAGE) 500 MG tablet Take 2 tablets (1,000 mg total) by mouth 2 (two) times daily with a meal.   nitrofurantoin, macrocrystal-monohydrate, (MACROBID) 100 MG capsule Take 1 capsule (100 mg total) by mouth 2 (two) times daily.   pantoprazole (PROTONIX) 40 MG tablet TAKE 1 TABLET 2 TIMES DAILY BEFORE A MEAL.   rosuvastatin (CRESTOR) 40 MG tablet Take 1 tablet (40 mg total) by mouth daily.   tamsulosin (FLOMAX) 0.4 MG CAPS capsule Take 1 capsule (0.4 mg total) by mouth daily.   vitamin B-12 (CYANOCOBALAMIN) 1000 MCG tablet Take 1 tablet (1,000 mcg total) by mouth daily.   VITAMIN D, CHOLECALCIFEROL, PO Take 2,000 Units by mouth.   Facility-Administered Encounter Medications as of 09/23/2020  Medication   technetium tetrofosmin (TC-MYOVIEW) injection 43.88 millicurie    Completed the patient assistance application for Farxiga on [place]. I have mailed the form to the  patient tried to contact patient about it, but I could not leave a voicemail. I have left instructions on the form for the patient to complete and sign, as well as bring back or mail back to PCP's office.    Corrie Mckusick, [place]

## 2020-09-24 ENCOUNTER — Telehealth: Payer: Self-pay

## 2020-09-24 ENCOUNTER — Telehealth: Payer: Self-pay | Admitting: Cardiology

## 2020-09-24 NOTE — Telephone Encounter (Signed)
Spoke with Tiffany from [facility]. I informed her that I am waiting on the result note for patients MV that was done and as soon as I receive that I will sent it on to the pre-op pool. She requested that I send her a Teams message when the result comes in.

## 2020-09-24 NOTE — Telephone Encounter (Signed)
Tiffany from [facility] practice calling  Wants to know if patient is cleared from upcoming surgery - PCP wants cardio opinion  Discussed the process of clearance needing to go to preop team but she is requesting to speak with nurse Please call [phone]

## 2020-09-24 NOTE — Telephone Encounter (Signed)
Surgical clearance faxed  

## 2020-09-24 NOTE — Telephone Encounter (Signed)
I updated my note to say that he is cleared for surgery. You can go ahead and fax it over to [facility].

## 2020-09-24 NOTE — Telephone Encounter (Signed)
Result in chart, per nurse he is good to have surgery

## 2020-09-24 NOTE — Telephone Encounter (Signed)
Called and left a message at heart care to see if patient is cleared. Will wait for a return call.

## 2020-09-24 NOTE — Telephone Encounter (Signed)
Patient received prescription card for Farxiga but was able to pick up at pharmacy without a prescription.  Please send a prescription to [facility] in [place] so patient's wife Liborio Nixon can pick it up as soon as possible.

## 2020-09-30 ENCOUNTER — Other Ambulatory Visit: Payer: Medicare Other

## 2020-10-06 ENCOUNTER — Telehealth: Payer: Self-pay | Admitting: Pharmacist

## 2020-10-06 NOTE — Chronic Care Management (AMB) (Signed)
    Chronic Care Management Pharmacy Assistant   Name: Carl Bryan  MRN: [id number] DOB: 19-Dec-1945    Reason for Encounter: Chart Review    Medications: Outpatient Encounter Medications as of 10/06/2020  Medication Sig   ACCU-CHEK AVIVA PLUS test strip Check 1 time a day   allopurinol (ZYLOPRIM) 300 MG tablet Take 1 tablet (300 mg total) by mouth daily.   amLODipine (NORVASC) 10 MG tablet Take 1 tablet (10 mg total) by mouth daily.   blood glucose meter kit and supplies KIT To check blood sugar twice a day. (FOR ICD-9 250.00, 250.01).   Blood Glucose Monitoring Suppl (ACCU-CHEK AVIVA PLUS) w/Device KIT Test 1 time daily   dapagliflozin propanediol (FARXIGA) 10 MG TABS tablet Take 1 tablet (10 mg total) by mouth daily before breakfast.   glipiZIDE (GLUCOTROL) 5 MG tablet Take 1 tablet (5 mg total) by mouth daily before breakfast.   losartan (COZAAR) 100 MG tablet Take 1 tablet (100 mg total) by mouth daily.   meloxicam (MOBIC) 15 MG tablet Take 1 tablet (15 mg total) by mouth daily.   metFORMIN (GLUCOPHAGE) 500 MG tablet Take 2 tablets (1,000 mg total) by mouth 2 (two) times daily with a meal.   nitrofurantoin, macrocrystal-monohydrate, (MACROBID) 100 MG capsule Take 1 capsule (100 mg total) by mouth 2 (two) times daily.   pantoprazole (PROTONIX) 40 MG tablet TAKE 1 TABLET 2 TIMES DAILY BEFORE A MEAL.   rosuvastatin (CRESTOR) 40 MG tablet Take 1 tablet (40 mg total) by mouth daily.   tamsulosin (FLOMAX) 0.4 MG CAPS capsule Take 1 capsule (0.4 mg total) by mouth daily.   vitamin B-12 (CYANOCOBALAMIN) 1000 MCG tablet Take 1 tablet (1,000 mcg total) by mouth daily.   VITAMIN D, CHOLECALCIFEROL, PO Take 2,000 Units by mouth.   No facility-administered encounter medications on file as of 10/06/2020.    Reviewed chart for medication changes and adherence.  No OVs, Consults, or hospital visits since last care coordination call / Pharmacist visit. No medication changes indicated  No gaps in  adherence identified. Patient has follow up scheduled with pharmacy team. No further action required.  Lizbeth Bark Clinical Pharmacist Assistant [phone]

## 2020-10-08 DIAGNOSIS — M1712 Unilateral primary osteoarthritis, left knee: Secondary | ICD-10-CM | POA: Diagnosis not present

## 2020-10-08 DIAGNOSIS — E119 Type 2 diabetes mellitus without complications: Secondary | ICD-10-CM | POA: Diagnosis not present

## 2020-10-08 DIAGNOSIS — M179 Osteoarthritis of knee, unspecified: Secondary | ICD-10-CM | POA: Diagnosis not present

## 2020-10-08 DIAGNOSIS — M6281 Muscle weakness (generalized): Secondary | ICD-10-CM | POA: Diagnosis not present

## 2020-10-08 DIAGNOSIS — Z471 Aftercare following joint replacement surgery: Secondary | ICD-10-CM | POA: Diagnosis not present

## 2020-10-08 DIAGNOSIS — I10 Essential (primary) hypertension: Secondary | ICD-10-CM | POA: Diagnosis not present

## 2020-10-08 DIAGNOSIS — E785 Hyperlipidemia, unspecified: Secondary | ICD-10-CM | POA: Diagnosis not present

## 2020-10-08 DIAGNOSIS — R2689 Other abnormalities of gait and mobility: Secondary | ICD-10-CM | POA: Diagnosis not present

## 2020-10-08 DIAGNOSIS — R2681 Unsteadiness on feet: Secondary | ICD-10-CM | POA: Diagnosis not present

## 2020-10-15 NOTE — Patient Instructions (Signed)
Visit Information  It was a pleasure speaking with you today! Thank you for letting me be a part of your care team. Please call with any questions or concerns.   Goals Addressed             This Visit's Progress    Improve My Heart Health-Coronary Artery Disease       Timeframe:  Long-Range Goal Priority:  High Start Date:                             Expected End Date:                       Follow Up Date 3 months   - be open to making changes - I can manage, know and watch for signs of a heart attack - if I have chest pain, call for help - learn about small changes that will make a big difference - learn my personal risk factors    Why is this important?   Lifestyle changes are key to improving the blood flow to your heart. Think about the things you can change and set a goal to live healthy.  Remember, when the blood vessels to your heart start to get clogged you may not have any symptoms.  Over time, they can get worse.  Don't ignore the signs, like chest pain, and get help right away.     Notes:       Monitor and Manage My Blood Sugar-Diabetes Type 2       Timeframe:  Long-Range Goal Priority:  High Start Date:                             Expected End Date:                       Follow Up Date 3 months   - check blood sugar at prescribed times - check blood sugar if I feel it is too high or too low - enter blood sugar readings and medication or insulin into daily log - take the blood sugar meter to all doctor visits    Why is this important?   Checking your blood sugar at home helps to keep it from getting very high or very low.  Writing the results in a diary or log helps the doctor know how to care for you.  Your blood sugar log should have the time, date and the results.  Also, write down the amount of insulin or other medicine that you take.  Other information, like what you ate, exercise done and how you were feeling, will also be helpful.     Notes:        Obtain Eye Exam-Diabetes Type 2       Follow Up Date 3 months   - schedule appointment with eye doctor    Why is this important?   Eye check-ups are important when you have diabetes.  Vision loss can be prevented.    Notes:           Carl Bryan was given information about Chronic Care Management services today including:  CCM service includes personalized support from designated clinical staff supervised by his physician, including individualized plan of care and coordination with other care providers 24/7 contact phone numbers for assistance for urgent and routine care needs.  Standard insurance, coinsurance, copays and deductibles apply for chronic care management only during months in which we provide at least 20 minutes of these services. Most insurances cover these services at 100%, however patients may be responsible for any copay, coinsurance and/or deductible if applicable. This service may help you avoid the need for more expensive face-to-face services. Only one practitioner may furnish and bill the service in a calendar month. The patient may stop CCM services at any time (effective at the end of the month) by phone call to the office staff.  Patient agreed to services and verbal consent obtained.   The patient verbalized understanding of instructions, educational materials, and care plan provided today and agreed to receive a mailed copy of patient instructions, educational materials, and care plan.  Telephone follow up appointment with pharmacy team member scheduled for: 3 months  [place] Pod. Tiburcio Pea PharmD, BCPS Clinical Pharmacist [phone]

## 2020-10-22 DIAGNOSIS — E559 Vitamin D deficiency, unspecified: Secondary | ICD-10-CM | POA: Diagnosis not present

## 2020-10-22 DIAGNOSIS — R9431 Abnormal electrocardiogram [ECG] [EKG]: Secondary | ICD-10-CM | POA: Diagnosis not present

## 2020-10-22 DIAGNOSIS — I700 Atherosclerosis of aorta: Secondary | ICD-10-CM | POA: Diagnosis not present

## 2020-10-22 DIAGNOSIS — E538 Deficiency of other specified B group vitamins: Secondary | ICD-10-CM | POA: Diagnosis not present

## 2020-10-22 DIAGNOSIS — Z87891 Personal history of nicotine dependence: Secondary | ICD-10-CM | POA: Diagnosis not present

## 2020-10-22 DIAGNOSIS — Z7982 Long term (current) use of aspirin: Secondary | ICD-10-CM | POA: Diagnosis not present

## 2020-10-22 DIAGNOSIS — K219 Gastro-esophageal reflux disease without esophagitis: Secondary | ICD-10-CM | POA: Diagnosis not present

## 2020-10-22 DIAGNOSIS — I152 Hypertension secondary to endocrine disorders: Secondary | ICD-10-CM | POA: Diagnosis not present

## 2020-10-22 DIAGNOSIS — E785 Hyperlipidemia, unspecified: Secondary | ICD-10-CM | POA: Diagnosis not present

## 2020-10-22 DIAGNOSIS — E1159 Type 2 diabetes mellitus with other circulatory complications: Secondary | ICD-10-CM | POA: Diagnosis not present

## 2020-10-22 DIAGNOSIS — E1169 Type 2 diabetes mellitus with other specified complication: Secondary | ICD-10-CM | POA: Diagnosis not present

## 2020-10-22 DIAGNOSIS — I2510 Atherosclerotic heart disease of native coronary artery without angina pectoris: Secondary | ICD-10-CM | POA: Diagnosis not present

## 2020-10-22 DIAGNOSIS — Z89611 Acquired absence of right leg above knee: Secondary | ICD-10-CM | POA: Diagnosis not present

## 2020-10-22 DIAGNOSIS — Z791 Long term (current) use of non-steroidal anti-inflammatories (NSAID): Secondary | ICD-10-CM | POA: Diagnosis not present

## 2020-10-22 DIAGNOSIS — Z471 Aftercare following joint replacement surgery: Secondary | ICD-10-CM | POA: Diagnosis not present

## 2020-10-22 DIAGNOSIS — Z96652 Presence of left artificial knee joint: Secondary | ICD-10-CM | POA: Diagnosis not present

## 2020-10-22 DIAGNOSIS — Z7984 Long term (current) use of oral hypoglycemic drugs: Secondary | ICD-10-CM | POA: Diagnosis not present

## 2020-10-25 ENCOUNTER — Telehealth: Payer: Self-pay

## 2020-10-25 NOTE — Telephone Encounter (Signed)
Carl Bryan is aware of provider OK for PT

## 2020-10-25 NOTE — Telephone Encounter (Signed)
Copied from CRM [id number]. Topic: Quick Communication - Home Health Verbal Orders >> Oct 25, 2020  3:00 PM Pawlus, Gifford Shave wrote: Caller/Agency: [facility] health  Callback Number: [phone] Requesting: PT  Frequency: 1x1, 3x4

## 2020-10-25 NOTE — Telephone Encounter (Signed)
Please let them know that PT is fine.

## 2020-10-26 DIAGNOSIS — I2510 Atherosclerotic heart disease of native coronary artery without angina pectoris: Secondary | ICD-10-CM | POA: Diagnosis not present

## 2020-10-26 DIAGNOSIS — Z96652 Presence of left artificial knee joint: Secondary | ICD-10-CM | POA: Diagnosis not present

## 2020-10-26 DIAGNOSIS — Z87891 Personal history of nicotine dependence: Secondary | ICD-10-CM | POA: Diagnosis not present

## 2020-10-26 DIAGNOSIS — K219 Gastro-esophageal reflux disease without esophagitis: Secondary | ICD-10-CM | POA: Diagnosis not present

## 2020-10-26 DIAGNOSIS — Z7984 Long term (current) use of oral hypoglycemic drugs: Secondary | ICD-10-CM | POA: Diagnosis not present

## 2020-10-26 DIAGNOSIS — Z471 Aftercare following joint replacement surgery: Secondary | ICD-10-CM | POA: Diagnosis not present

## 2020-10-26 DIAGNOSIS — R9431 Abnormal electrocardiogram [ECG] [EKG]: Secondary | ICD-10-CM | POA: Diagnosis not present

## 2020-10-26 DIAGNOSIS — Z791 Long term (current) use of non-steroidal anti-inflammatories (NSAID): Secondary | ICD-10-CM | POA: Diagnosis not present

## 2020-10-26 DIAGNOSIS — E559 Vitamin D deficiency, unspecified: Secondary | ICD-10-CM | POA: Diagnosis not present

## 2020-10-26 DIAGNOSIS — E1169 Type 2 diabetes mellitus with other specified complication: Secondary | ICD-10-CM | POA: Diagnosis not present

## 2020-10-26 DIAGNOSIS — Z89611 Acquired absence of right leg above knee: Secondary | ICD-10-CM | POA: Diagnosis not present

## 2020-10-26 DIAGNOSIS — E538 Deficiency of other specified B group vitamins: Secondary | ICD-10-CM | POA: Diagnosis not present

## 2020-10-26 DIAGNOSIS — I152 Hypertension secondary to endocrine disorders: Secondary | ICD-10-CM | POA: Diagnosis not present

## 2020-10-26 DIAGNOSIS — I700 Atherosclerosis of aorta: Secondary | ICD-10-CM | POA: Diagnosis not present

## 2020-10-26 DIAGNOSIS — Z7982 Long term (current) use of aspirin: Secondary | ICD-10-CM | POA: Diagnosis not present

## 2020-10-26 DIAGNOSIS — E785 Hyperlipidemia, unspecified: Secondary | ICD-10-CM | POA: Diagnosis not present

## 2020-10-26 DIAGNOSIS — E1159 Type 2 diabetes mellitus with other circulatory complications: Secondary | ICD-10-CM | POA: Diagnosis not present

## 2020-10-27 DIAGNOSIS — I2510 Atherosclerotic heart disease of native coronary artery without angina pectoris: Secondary | ICD-10-CM | POA: Diagnosis not present

## 2020-10-27 DIAGNOSIS — Z7982 Long term (current) use of aspirin: Secondary | ICD-10-CM | POA: Diagnosis not present

## 2020-10-27 DIAGNOSIS — Z7984 Long term (current) use of oral hypoglycemic drugs: Secondary | ICD-10-CM | POA: Diagnosis not present

## 2020-10-27 DIAGNOSIS — E1159 Type 2 diabetes mellitus with other circulatory complications: Secondary | ICD-10-CM | POA: Diagnosis not present

## 2020-10-27 DIAGNOSIS — Z87891 Personal history of nicotine dependence: Secondary | ICD-10-CM | POA: Diagnosis not present

## 2020-10-27 DIAGNOSIS — Z791 Long term (current) use of non-steroidal anti-inflammatories (NSAID): Secondary | ICD-10-CM | POA: Diagnosis not present

## 2020-10-27 DIAGNOSIS — Z89611 Acquired absence of right leg above knee: Secondary | ICD-10-CM | POA: Diagnosis not present

## 2020-10-27 DIAGNOSIS — Z96652 Presence of left artificial knee joint: Secondary | ICD-10-CM | POA: Diagnosis not present

## 2020-10-27 DIAGNOSIS — E1169 Type 2 diabetes mellitus with other specified complication: Secondary | ICD-10-CM | POA: Diagnosis not present

## 2020-10-27 DIAGNOSIS — E785 Hyperlipidemia, unspecified: Secondary | ICD-10-CM | POA: Diagnosis not present

## 2020-10-27 DIAGNOSIS — K219 Gastro-esophageal reflux disease without esophagitis: Secondary | ICD-10-CM | POA: Diagnosis not present

## 2020-10-27 DIAGNOSIS — I700 Atherosclerosis of aorta: Secondary | ICD-10-CM | POA: Diagnosis not present

## 2020-10-27 DIAGNOSIS — R9431 Abnormal electrocardiogram [ECG] [EKG]: Secondary | ICD-10-CM | POA: Diagnosis not present

## 2020-10-27 DIAGNOSIS — E559 Vitamin D deficiency, unspecified: Secondary | ICD-10-CM | POA: Diagnosis not present

## 2020-10-27 DIAGNOSIS — E538 Deficiency of other specified B group vitamins: Secondary | ICD-10-CM | POA: Diagnosis not present

## 2020-10-27 DIAGNOSIS — Z471 Aftercare following joint replacement surgery: Secondary | ICD-10-CM | POA: Diagnosis not present

## 2020-10-27 DIAGNOSIS — I152 Hypertension secondary to endocrine disorders: Secondary | ICD-10-CM | POA: Diagnosis not present

## 2020-10-29 DIAGNOSIS — Z7984 Long term (current) use of oral hypoglycemic drugs: Secondary | ICD-10-CM | POA: Diagnosis not present

## 2020-10-29 DIAGNOSIS — I152 Hypertension secondary to endocrine disorders: Secondary | ICD-10-CM | POA: Diagnosis not present

## 2020-10-29 DIAGNOSIS — Z791 Long term (current) use of non-steroidal anti-inflammatories (NSAID): Secondary | ICD-10-CM | POA: Diagnosis not present

## 2020-10-29 DIAGNOSIS — Z7982 Long term (current) use of aspirin: Secondary | ICD-10-CM | POA: Diagnosis not present

## 2020-10-29 DIAGNOSIS — Z87891 Personal history of nicotine dependence: Secondary | ICD-10-CM | POA: Diagnosis not present

## 2020-10-29 DIAGNOSIS — Z89611 Acquired absence of right leg above knee: Secondary | ICD-10-CM | POA: Diagnosis not present

## 2020-10-29 DIAGNOSIS — Z471 Aftercare following joint replacement surgery: Secondary | ICD-10-CM | POA: Diagnosis not present

## 2020-10-29 DIAGNOSIS — E1159 Type 2 diabetes mellitus with other circulatory complications: Secondary | ICD-10-CM | POA: Diagnosis not present

## 2020-10-29 DIAGNOSIS — R9431 Abnormal electrocardiogram [ECG] [EKG]: Secondary | ICD-10-CM | POA: Diagnosis not present

## 2020-10-29 DIAGNOSIS — E785 Hyperlipidemia, unspecified: Secondary | ICD-10-CM | POA: Diagnosis not present

## 2020-10-29 DIAGNOSIS — E538 Deficiency of other specified B group vitamins: Secondary | ICD-10-CM | POA: Diagnosis not present

## 2020-10-29 DIAGNOSIS — I700 Atherosclerosis of aorta: Secondary | ICD-10-CM | POA: Diagnosis not present

## 2020-10-29 DIAGNOSIS — E559 Vitamin D deficiency, unspecified: Secondary | ICD-10-CM | POA: Diagnosis not present

## 2020-10-29 DIAGNOSIS — Z96652 Presence of left artificial knee joint: Secondary | ICD-10-CM | POA: Diagnosis not present

## 2020-10-29 DIAGNOSIS — K219 Gastro-esophageal reflux disease without esophagitis: Secondary | ICD-10-CM | POA: Diagnosis not present

## 2020-10-29 DIAGNOSIS — E1169 Type 2 diabetes mellitus with other specified complication: Secondary | ICD-10-CM | POA: Diagnosis not present

## 2020-10-29 DIAGNOSIS — I2510 Atherosclerotic heart disease of native coronary artery without angina pectoris: Secondary | ICD-10-CM | POA: Diagnosis not present

## 2020-11-01 DIAGNOSIS — E559 Vitamin D deficiency, unspecified: Secondary | ICD-10-CM | POA: Diagnosis not present

## 2020-11-01 DIAGNOSIS — E538 Deficiency of other specified B group vitamins: Secondary | ICD-10-CM | POA: Diagnosis not present

## 2020-11-01 DIAGNOSIS — E785 Hyperlipidemia, unspecified: Secondary | ICD-10-CM | POA: Diagnosis not present

## 2020-11-01 DIAGNOSIS — Z7984 Long term (current) use of oral hypoglycemic drugs: Secondary | ICD-10-CM | POA: Diagnosis not present

## 2020-11-01 DIAGNOSIS — Z791 Long term (current) use of non-steroidal anti-inflammatories (NSAID): Secondary | ICD-10-CM | POA: Diagnosis not present

## 2020-11-01 DIAGNOSIS — E1169 Type 2 diabetes mellitus with other specified complication: Secondary | ICD-10-CM | POA: Diagnosis not present

## 2020-11-01 DIAGNOSIS — Z89611 Acquired absence of right leg above knee: Secondary | ICD-10-CM | POA: Diagnosis not present

## 2020-11-01 DIAGNOSIS — Z7982 Long term (current) use of aspirin: Secondary | ICD-10-CM | POA: Diagnosis not present

## 2020-11-01 DIAGNOSIS — I152 Hypertension secondary to endocrine disorders: Secondary | ICD-10-CM | POA: Diagnosis not present

## 2020-11-01 DIAGNOSIS — Z471 Aftercare following joint replacement surgery: Secondary | ICD-10-CM | POA: Diagnosis not present

## 2020-11-01 DIAGNOSIS — E1159 Type 2 diabetes mellitus with other circulatory complications: Secondary | ICD-10-CM | POA: Diagnosis not present

## 2020-11-01 DIAGNOSIS — Z87891 Personal history of nicotine dependence: Secondary | ICD-10-CM | POA: Diagnosis not present

## 2020-11-01 DIAGNOSIS — R9431 Abnormal electrocardiogram [ECG] [EKG]: Secondary | ICD-10-CM | POA: Diagnosis not present

## 2020-11-01 DIAGNOSIS — Z96652 Presence of left artificial knee joint: Secondary | ICD-10-CM | POA: Diagnosis not present

## 2020-11-01 DIAGNOSIS — I2510 Atherosclerotic heart disease of native coronary artery without angina pectoris: Secondary | ICD-10-CM | POA: Diagnosis not present

## 2020-11-01 DIAGNOSIS — I700 Atherosclerosis of aorta: Secondary | ICD-10-CM | POA: Diagnosis not present

## 2020-11-01 DIAGNOSIS — K219 Gastro-esophageal reflux disease without esophagitis: Secondary | ICD-10-CM | POA: Diagnosis not present

## 2020-11-03 DIAGNOSIS — Z87891 Personal history of nicotine dependence: Secondary | ICD-10-CM | POA: Diagnosis not present

## 2020-11-03 DIAGNOSIS — E1169 Type 2 diabetes mellitus with other specified complication: Secondary | ICD-10-CM | POA: Diagnosis not present

## 2020-11-03 DIAGNOSIS — Z471 Aftercare following joint replacement surgery: Secondary | ICD-10-CM | POA: Diagnosis not present

## 2020-11-03 DIAGNOSIS — Z96652 Presence of left artificial knee joint: Secondary | ICD-10-CM | POA: Diagnosis not present

## 2020-11-03 DIAGNOSIS — Z7982 Long term (current) use of aspirin: Secondary | ICD-10-CM | POA: Diagnosis not present

## 2020-11-03 DIAGNOSIS — K219 Gastro-esophageal reflux disease without esophagitis: Secondary | ICD-10-CM | POA: Diagnosis not present

## 2020-11-03 DIAGNOSIS — E559 Vitamin D deficiency, unspecified: Secondary | ICD-10-CM | POA: Diagnosis not present

## 2020-11-03 DIAGNOSIS — Z791 Long term (current) use of non-steroidal anti-inflammatories (NSAID): Secondary | ICD-10-CM | POA: Diagnosis not present

## 2020-11-03 DIAGNOSIS — Z7984 Long term (current) use of oral hypoglycemic drugs: Secondary | ICD-10-CM | POA: Diagnosis not present

## 2020-11-03 DIAGNOSIS — I2510 Atherosclerotic heart disease of native coronary artery without angina pectoris: Secondary | ICD-10-CM | POA: Diagnosis not present

## 2020-11-03 DIAGNOSIS — E538 Deficiency of other specified B group vitamins: Secondary | ICD-10-CM | POA: Diagnosis not present

## 2020-11-03 DIAGNOSIS — E785 Hyperlipidemia, unspecified: Secondary | ICD-10-CM | POA: Diagnosis not present

## 2020-11-03 DIAGNOSIS — R9431 Abnormal electrocardiogram [ECG] [EKG]: Secondary | ICD-10-CM | POA: Diagnosis not present

## 2020-11-03 DIAGNOSIS — E1159 Type 2 diabetes mellitus with other circulatory complications: Secondary | ICD-10-CM | POA: Diagnosis not present

## 2020-11-03 DIAGNOSIS — Z89611 Acquired absence of right leg above knee: Secondary | ICD-10-CM | POA: Diagnosis not present

## 2020-11-03 DIAGNOSIS — I152 Hypertension secondary to endocrine disorders: Secondary | ICD-10-CM | POA: Diagnosis not present

## 2020-11-03 DIAGNOSIS — I700 Atherosclerosis of aorta: Secondary | ICD-10-CM | POA: Diagnosis not present

## 2020-11-05 DIAGNOSIS — E538 Deficiency of other specified B group vitamins: Secondary | ICD-10-CM | POA: Diagnosis not present

## 2020-11-05 DIAGNOSIS — E559 Vitamin D deficiency, unspecified: Secondary | ICD-10-CM | POA: Diagnosis not present

## 2020-11-05 DIAGNOSIS — R9431 Abnormal electrocardiogram [ECG] [EKG]: Secondary | ICD-10-CM | POA: Diagnosis not present

## 2020-11-05 DIAGNOSIS — I700 Atherosclerosis of aorta: Secondary | ICD-10-CM | POA: Diagnosis not present

## 2020-11-05 DIAGNOSIS — I2510 Atherosclerotic heart disease of native coronary artery without angina pectoris: Secondary | ICD-10-CM | POA: Diagnosis not present

## 2020-11-05 DIAGNOSIS — E785 Hyperlipidemia, unspecified: Secondary | ICD-10-CM | POA: Diagnosis not present

## 2020-11-05 DIAGNOSIS — Z7984 Long term (current) use of oral hypoglycemic drugs: Secondary | ICD-10-CM | POA: Diagnosis not present

## 2020-11-05 DIAGNOSIS — Z89611 Acquired absence of right leg above knee: Secondary | ICD-10-CM | POA: Diagnosis not present

## 2020-11-05 DIAGNOSIS — E1159 Type 2 diabetes mellitus with other circulatory complications: Secondary | ICD-10-CM | POA: Diagnosis not present

## 2020-11-05 DIAGNOSIS — K219 Gastro-esophageal reflux disease without esophagitis: Secondary | ICD-10-CM | POA: Diagnosis not present

## 2020-11-05 DIAGNOSIS — E1169 Type 2 diabetes mellitus with other specified complication: Secondary | ICD-10-CM | POA: Diagnosis not present

## 2020-11-05 DIAGNOSIS — Z471 Aftercare following joint replacement surgery: Secondary | ICD-10-CM | POA: Diagnosis not present

## 2020-11-05 DIAGNOSIS — Z96652 Presence of left artificial knee joint: Secondary | ICD-10-CM | POA: Diagnosis not present

## 2020-11-05 DIAGNOSIS — Z791 Long term (current) use of non-steroidal anti-inflammatories (NSAID): Secondary | ICD-10-CM | POA: Diagnosis not present

## 2020-11-05 DIAGNOSIS — I152 Hypertension secondary to endocrine disorders: Secondary | ICD-10-CM | POA: Diagnosis not present

## 2020-11-05 DIAGNOSIS — Z7982 Long term (current) use of aspirin: Secondary | ICD-10-CM | POA: Diagnosis not present

## 2020-11-05 DIAGNOSIS — Z87891 Personal history of nicotine dependence: Secondary | ICD-10-CM | POA: Diagnosis not present

## 2020-11-08 DIAGNOSIS — K219 Gastro-esophageal reflux disease without esophagitis: Secondary | ICD-10-CM | POA: Diagnosis not present

## 2020-11-08 DIAGNOSIS — I2510 Atherosclerotic heart disease of native coronary artery without angina pectoris: Secondary | ICD-10-CM | POA: Diagnosis not present

## 2020-11-08 DIAGNOSIS — E538 Deficiency of other specified B group vitamins: Secondary | ICD-10-CM | POA: Diagnosis not present

## 2020-11-08 DIAGNOSIS — Z471 Aftercare following joint replacement surgery: Secondary | ICD-10-CM | POA: Diagnosis not present

## 2020-11-08 DIAGNOSIS — Z791 Long term (current) use of non-steroidal anti-inflammatories (NSAID): Secondary | ICD-10-CM | POA: Diagnosis not present

## 2020-11-08 DIAGNOSIS — E1169 Type 2 diabetes mellitus with other specified complication: Secondary | ICD-10-CM | POA: Diagnosis not present

## 2020-11-08 DIAGNOSIS — E785 Hyperlipidemia, unspecified: Secondary | ICD-10-CM | POA: Diagnosis not present

## 2020-11-08 DIAGNOSIS — E559 Vitamin D deficiency, unspecified: Secondary | ICD-10-CM | POA: Diagnosis not present

## 2020-11-08 DIAGNOSIS — E1159 Type 2 diabetes mellitus with other circulatory complications: Secondary | ICD-10-CM | POA: Diagnosis not present

## 2020-11-08 DIAGNOSIS — R9431 Abnormal electrocardiogram [ECG] [EKG]: Secondary | ICD-10-CM | POA: Diagnosis not present

## 2020-11-08 DIAGNOSIS — I152 Hypertension secondary to endocrine disorders: Secondary | ICD-10-CM | POA: Diagnosis not present

## 2020-11-08 DIAGNOSIS — I700 Atherosclerosis of aorta: Secondary | ICD-10-CM | POA: Diagnosis not present

## 2020-11-08 DIAGNOSIS — Z7984 Long term (current) use of oral hypoglycemic drugs: Secondary | ICD-10-CM | POA: Diagnosis not present

## 2020-11-08 DIAGNOSIS — Z89611 Acquired absence of right leg above knee: Secondary | ICD-10-CM | POA: Diagnosis not present

## 2020-11-08 DIAGNOSIS — Z96652 Presence of left artificial knee joint: Secondary | ICD-10-CM | POA: Diagnosis not present

## 2020-11-08 DIAGNOSIS — Z7982 Long term (current) use of aspirin: Secondary | ICD-10-CM | POA: Diagnosis not present

## 2020-11-08 DIAGNOSIS — Z87891 Personal history of nicotine dependence: Secondary | ICD-10-CM | POA: Diagnosis not present

## 2020-11-09 ENCOUNTER — Ambulatory Visit: Payer: Medicare Other | Admitting: Nurse Practitioner

## 2020-11-09 DIAGNOSIS — Z96652 Presence of left artificial knee joint: Secondary | ICD-10-CM | POA: Diagnosis not present

## 2020-11-11 ENCOUNTER — Encounter: Payer: Self-pay | Admitting: Nurse Practitioner

## 2020-11-11 ENCOUNTER — Other Ambulatory Visit: Payer: Self-pay

## 2020-11-11 ENCOUNTER — Ambulatory Visit (INDEPENDENT_AMBULATORY_CARE_PROVIDER_SITE_OTHER): Payer: Medicare Other | Admitting: Nurse Practitioner

## 2020-11-11 VITALS — BP 124/61 | HR 60 | Temp 98.2°F | Wt 156.2 lb

## 2020-11-11 DIAGNOSIS — E1169 Type 2 diabetes mellitus with other specified complication: Secondary | ICD-10-CM | POA: Diagnosis not present

## 2020-11-11 DIAGNOSIS — Z87891 Personal history of nicotine dependence: Secondary | ICD-10-CM | POA: Diagnosis not present

## 2020-11-11 DIAGNOSIS — Z791 Long term (current) use of non-steroidal anti-inflammatories (NSAID): Secondary | ICD-10-CM | POA: Diagnosis not present

## 2020-11-11 DIAGNOSIS — M1712 Unilateral primary osteoarthritis, left knee: Secondary | ICD-10-CM

## 2020-11-11 DIAGNOSIS — Z471 Aftercare following joint replacement surgery: Secondary | ICD-10-CM | POA: Diagnosis not present

## 2020-11-11 DIAGNOSIS — Z89611 Acquired absence of right leg above knee: Secondary | ICD-10-CM | POA: Diagnosis not present

## 2020-11-11 DIAGNOSIS — R9431 Abnormal electrocardiogram [ECG] [EKG]: Secondary | ICD-10-CM | POA: Diagnosis not present

## 2020-11-11 DIAGNOSIS — Z7984 Long term (current) use of oral hypoglycemic drugs: Secondary | ICD-10-CM | POA: Diagnosis not present

## 2020-11-11 DIAGNOSIS — Z7982 Long term (current) use of aspirin: Secondary | ICD-10-CM | POA: Diagnosis not present

## 2020-11-11 DIAGNOSIS — I2510 Atherosclerotic heart disease of native coronary artery without angina pectoris: Secondary | ICD-10-CM | POA: Diagnosis not present

## 2020-11-11 DIAGNOSIS — E785 Hyperlipidemia, unspecified: Secondary | ICD-10-CM | POA: Diagnosis not present

## 2020-11-11 DIAGNOSIS — E559 Vitamin D deficiency, unspecified: Secondary | ICD-10-CM | POA: Diagnosis not present

## 2020-11-11 DIAGNOSIS — K219 Gastro-esophageal reflux disease without esophagitis: Secondary | ICD-10-CM | POA: Diagnosis not present

## 2020-11-11 DIAGNOSIS — E538 Deficiency of other specified B group vitamins: Secondary | ICD-10-CM | POA: Diagnosis not present

## 2020-11-11 DIAGNOSIS — E1159 Type 2 diabetes mellitus with other circulatory complications: Secondary | ICD-10-CM | POA: Diagnosis not present

## 2020-11-11 DIAGNOSIS — I152 Hypertension secondary to endocrine disorders: Secondary | ICD-10-CM | POA: Diagnosis not present

## 2020-11-11 DIAGNOSIS — I700 Atherosclerosis of aorta: Secondary | ICD-10-CM | POA: Diagnosis not present

## 2020-11-11 DIAGNOSIS — Z96652 Presence of left artificial knee joint: Secondary | ICD-10-CM | POA: Diagnosis not present

## 2020-11-11 MED ORDER — EMPAGLIFLOZIN 10 MG PO TABS: 10.0000 mg | ORAL_TABLET | Freq: Every day | ORAL | 0 refills | Status: DC

## 2020-11-11 NOTE — Assessment & Plan Note (Signed)
Had recent knee replacement. Healing well. Doing physical therapy every other day at home. Continue collaboration with orthopedics.

## 2020-11-11 NOTE — Progress Notes (Signed)
Established Patient Office Visit  Subjective:  Patient ID: Carl Bryan, male    DOB: Aug 09, 1945  Age: 75 y.o. MRN: [id number]  CC:  Chief Complaint  Patient presents with   Diabetes   Other    Knee surgery    HPI Carl Bryan presents for follow-up on diabetes and recent knee surgery.   He recently had a left knee replacement and is doing well. He went to SNF short term after surgery and is now doing PT at home every other day. He is able to ambulate with a walker. He is not having any pain. Endorses mild knee swelling. Denies chest pain and shortness of breath.   He was started on farxiga last visit and took it for a month with the coupon for free sample. He went to pick it up at his pharmacy and it was a $400 copay so he didn't continue taking it. His blood sugars have been 120s fasting with the addition of farxiga. Will send in prescription for jardiance to see if that is any more affordable. May need to reach out to pharmacist to help with cost.   Past Medical History:  Diagnosis Date   Bleeding ulcer    Bronchospasm    Diabetes mellitus without complication ([place])    Type 2   Diverticulosis    Pure hypercholesterolemia    Traumatic amputation of leg above knee [facility])     Past Surgical History:  Procedure Laterality Date   ABOVE KNEE LEG AMPUTATION  04/10/1969   APPENDECTOMY     KNEE SURGERY     x 2.   REPLACEMENT TOTAL KNEE Left     Family History  Problem Relation Age of Onset   Cancer Mother        Colon cancer   Lung disease Father     Social History   Socioeconomic History   Marital status: Married    Spouse name: Not on file   Number of children: Not on file   Years of education: Not on file   Highest education level: 6th grade  Occupational History   Occupation: Retired  Tobacco Use   Smoking status: Former    Types: Cigarettes    Quit date: 09/29/1964    Years since quitting: 56.1   Smokeless tobacco: Never  Vaping Use   Vaping Use: Never used   Substance and Sexual Activity   Alcohol use: No    Alcohol/week: 0.0 standard drinks   Drug use: No   Sexual activity: Not on file  Other Topics Concern   Not on file  Social History Narrative   Not on file   Social Determinants of Health   Financial Resource Strain: Low Risk    Difficulty of Paying Living Expenses: Not hard at all  Food Insecurity: No Food Insecurity   Worried About Charity fundraiser in the Last Year: Never true   [place] in the Last Year: Never true  Transportation Needs: No Transportation Needs   Lack of Transportation (Medical): No   Lack of Transportation (Non-Medical): No  Physical Activity: Inactive   Days of Exercise per Week: 0 days   Minutes of Exercise per Session: 0 min  Stress: No Stress Concern Present   Feeling of Stress : Not at all  Social Connections: Not on file  Intimate Partner Violence: Not on file    Outpatient Medications Prior to Visit  Medication Sig Dispense Refill   ACCU-CHEK AVIVA PLUS test  strip Check 1 time a day 100 each 12   allopurinol (ZYLOPRIM) 300 MG tablet Take 1 tablet (300 mg total) by mouth daily. 90 tablet 1   amLODipine (NORVASC) 10 MG tablet Take 1 tablet (10 mg total) by mouth daily. 90 tablet 1   blood glucose meter kit and supplies KIT To check blood sugar twice a day. (FOR ICD-9 250.00, 250.01). 1 each 0   Blood Glucose Monitoring Suppl (ACCU-CHEK AVIVA PLUS) w/Device KIT Test 1 time daily 1 kit 12   glipiZIDE (GLUCOTROL) 5 MG tablet Take 1 tablet (5 mg total) by mouth daily before breakfast. 90 tablet 1   losartan (COZAAR) 100 MG tablet Take 1 tablet (100 mg total) by mouth daily. 90 tablet 1   meloxicam (MOBIC) 15 MG tablet Take 1 tablet (15 mg total) by mouth daily. 90 tablet 1   metFORMIN (GLUCOPHAGE) 500 MG tablet Take 2 tablets (1,000 mg total) by mouth 2 (two) times daily with a meal. 360 tablet 1   pantoprazole (PROTONIX) 40 MG tablet TAKE 1 TABLET 2 TIMES DAILY BEFORE A MEAL. 180 tablet 3    rosuvastatin (CRESTOR) 40 MG tablet Take 1 tablet (40 mg total) by mouth daily. 90 tablet 1   tamsulosin (FLOMAX) 0.4 MG CAPS capsule Take 1 capsule (0.4 mg total) by mouth daily. 90 capsule 1   vitamin B-12 (CYANOCOBALAMIN) 1000 MCG tablet Take 1 tablet (1,000 mcg total) by mouth daily. 90 tablet 4   VITAMIN D, CHOLECALCIFEROL, PO Take 2,000 Units by mouth.     dapagliflozin propanediol (FARXIGA) 10 MG TABS tablet Take 1 tablet (10 mg total) by mouth daily before breakfast. (Patient not taking: Reported on 11/11/2020) 90 tablet 1   nitrofurantoin, macrocrystal-monohydrate, (MACROBID) 100 MG capsule Take 1 capsule (100 mg total) by mouth 2 (two) times daily. 10 capsule 0   No facility-administered medications prior to visit.    Allergies  Allergen Reactions   Doxycycline Calcium Rash   Tussionex Pennkinetic Er [Hydrocod Polst-Cpm Polst Er] Itching   Vytorin [Ezetimibe-Simvastatin]     Sleepy    ROS Review of Systems  Constitutional: Negative.   HENT: Negative.    Respiratory: Negative.    Cardiovascular: Negative.   Gastrointestinal: Negative.   Musculoskeletal:        No pain in knee from recent knee surgery  Skin: Negative.   Neurological: Negative.      Objective:    Physical Exam Vitals and nursing note reviewed.  Constitutional:      Appearance: Normal appearance.  HENT:     Head: Normocephalic.  Eyes:     Conjunctiva/sclera: Conjunctivae normal.  Cardiovascular:     Rate and Rhythm: Normal rate and regular rhythm.     Pulses: Normal pulses.     Heart sounds: Normal heart sounds.  Pulmonary:     Effort: Pulmonary effort is normal.     Breath sounds: Normal breath sounds.  Musculoskeletal:        General: Swelling (trace swelling to left knee) present.     Cervical back: Normal range of motion.     Comments: Able to ambulate with walker. Right AKA.   Skin:    General: Skin is warm and dry.     Comments: Incision to left knee approximated, healing well. No signs  of infection  Neurological:     General: No focal deficit present.     Mental Status: He is alert and oriented to person, place, and time.  Psychiatric:  Mood and Affect: Mood normal.        Behavior: Behavior normal.        Thought Content: Thought content normal.        Judgment: Judgment normal.    BP 124/61   Pulse 60   Temp 98.2 F (36.8 C) (Oral)   Wt 156 lb 3.2 oz (70.9 kg)   SpO2 94%   BMI 25.99 kg/m  Wt Readings from Last 3 Encounters:  11/11/20 156 lb 3.2 oz (70.9 kg)  09/13/20 163 lb (73.9 kg)  09/09/20 163 lb (73.9 kg)     Health Maintenance Due  Topic Date Due   Zoster Vaccines- Shingrix (1 of 2) Never done   COVID-19 Vaccine (3 - Booster for Moderna series) 12/09/2019   OPHTHALMOLOGY EXAM  08/31/2020   INFLUENZA VACCINE  11/08/2020    There are no preventive care reminders to display for this patient.  Lab Results  Component Value Date   TSH 3.200 07/28/2020   Lab Results  Component Value Date   WBC 8.8 09/09/2020   HGB 15.6 09/09/2020   HCT 44.8 09/09/2020   MCV 91 09/09/2020   PLT 193 09/09/2020   Lab Results  Component Value Date   NA 137 09/09/2020   K 4.2 09/09/2020   CO2 20 09/09/2020   GLUCOSE 180 (H) 09/09/2020   BUN 15 09/09/2020   CREATININE 1.10 09/09/2020   BILITOT 0.8 09/09/2020   ALKPHOS 75 09/09/2020   AST 18 09/09/2020   ALT 32 09/09/2020   PROT 6.8 09/09/2020   ALBUMIN 4.6 09/09/2020   CALCIUM 9.9 09/09/2020   ANIONGAP 11 03/11/2019   EGFR 70 09/09/2020   Lab Results  Component Value Date   CHOL 113 09/09/2020   Lab Results  Component Value Date   HDL 37 (L) 09/09/2020   Lab Results  Component Value Date   LDLCALC 55 09/09/2020   Lab Results  Component Value Date   TRIG 118 09/09/2020   Lab Results  Component Value Date   CHOLHDL 3.1 09/09/2020   Lab Results  Component Value Date   HGBA1C 8.2 (H) 09/09/2020      Assessment & Plan:   Problem List Items Addressed This Visit        Endocrine   Diabetes mellitus associated with hormonal etiology ([place]) - Primary    Sugars have been well controlled at home with the addition of farxiga. Unable to afford future refills after coupon. Will change to jardiance to see if this is more affordable. He will call the office if it's too expensive at the pharmacy. Follow-up in 1 month.        Relevant Medications   empagliflozin (JARDIANCE) 10 MG TABS tablet     Musculoskeletal and Integument   Arthritis of knee, left    Had recent knee replacement. Healing well. Doing physical therapy every other day at home. Continue collaboration with orthopedics.         Meds ordered this encounter  Medications   empagliflozin (JARDIANCE) 10 MG TABS tablet    Sig: Take 1 tablet (10 mg total) by mouth daily before breakfast.    Dispense:  30 tablet    Refill:  0    Follow-up: Return in about 4 weeks (around 12/09/2020) for diabetes.    Charyl Dancer, NP

## 2020-11-11 NOTE — Assessment & Plan Note (Signed)
Sugars have been well controlled at home with the addition of farxiga. Unable to afford future refills after coupon. Will change to jardiance to see if this is more affordable. He will call the office if it's too expensive at the pharmacy. Follow-up in 1 month.

## 2020-11-12 ENCOUNTER — Telehealth: Payer: Self-pay | Admitting: Nurse Practitioner

## 2020-11-12 ENCOUNTER — Other Ambulatory Visit: Payer: Self-pay | Admitting: Nurse Practitioner

## 2020-11-12 DIAGNOSIS — Z7982 Long term (current) use of aspirin: Secondary | ICD-10-CM | POA: Diagnosis not present

## 2020-11-12 DIAGNOSIS — E785 Hyperlipidemia, unspecified: Secondary | ICD-10-CM | POA: Diagnosis not present

## 2020-11-12 DIAGNOSIS — Z87891 Personal history of nicotine dependence: Secondary | ICD-10-CM | POA: Diagnosis not present

## 2020-11-12 DIAGNOSIS — Z96652 Presence of left artificial knee joint: Secondary | ICD-10-CM | POA: Diagnosis not present

## 2020-11-12 DIAGNOSIS — I700 Atherosclerosis of aorta: Secondary | ICD-10-CM | POA: Diagnosis not present

## 2020-11-12 DIAGNOSIS — Z7984 Long term (current) use of oral hypoglycemic drugs: Secondary | ICD-10-CM | POA: Diagnosis not present

## 2020-11-12 DIAGNOSIS — Z471 Aftercare following joint replacement surgery: Secondary | ICD-10-CM | POA: Diagnosis not present

## 2020-11-12 DIAGNOSIS — E1159 Type 2 diabetes mellitus with other circulatory complications: Secondary | ICD-10-CM | POA: Diagnosis not present

## 2020-11-12 DIAGNOSIS — Z89611 Acquired absence of right leg above knee: Secondary | ICD-10-CM | POA: Diagnosis not present

## 2020-11-12 DIAGNOSIS — E538 Deficiency of other specified B group vitamins: Secondary | ICD-10-CM | POA: Diagnosis not present

## 2020-11-12 DIAGNOSIS — I2510 Atherosclerotic heart disease of native coronary artery without angina pectoris: Secondary | ICD-10-CM | POA: Diagnosis not present

## 2020-11-12 DIAGNOSIS — R9431 Abnormal electrocardiogram [ECG] [EKG]: Secondary | ICD-10-CM | POA: Diagnosis not present

## 2020-11-12 DIAGNOSIS — K219 Gastro-esophageal reflux disease without esophagitis: Secondary | ICD-10-CM | POA: Diagnosis not present

## 2020-11-12 DIAGNOSIS — E1169 Type 2 diabetes mellitus with other specified complication: Secondary | ICD-10-CM

## 2020-11-12 DIAGNOSIS — I152 Hypertension secondary to endocrine disorders: Secondary | ICD-10-CM | POA: Diagnosis not present

## 2020-11-12 DIAGNOSIS — E559 Vitamin D deficiency, unspecified: Secondary | ICD-10-CM | POA: Diagnosis not present

## 2020-11-12 DIAGNOSIS — Z791 Long term (current) use of non-steroidal anti-inflammatories (NSAID): Secondary | ICD-10-CM | POA: Diagnosis not present

## 2020-11-12 MED ORDER — LOSARTAN POTASSIUM 100 MG PO TABS: 100.0000 mg | ORAL_TABLET | Freq: Every day | ORAL | 1 refills | Status: DC

## 2020-11-12 MED ORDER — ALLOPURINOL 300 MG PO TABS: 300.0000 mg | ORAL_TABLET | Freq: Every day | ORAL | 1 refills | Status: DC

## 2020-11-12 NOTE — Telephone Encounter (Signed)
Medication Refill - Medication:allopurinol (ZYLOPRIM) 300 MG tablet and losartan (COZAAR) 100 MG tablet  Has the patient contacted their pharmacy? yes (Agent: If no, request that the patient contact the pharmacy for the refill.) (Agent: If yes, when and what did the pharmacy advise?)contact pcp  Preferred Pharmacy (with phone number or street name):  [facility]  [facility]) - [place], [place] - [address] Phone:  [phone]  Fax:  [phone]      Agent: Please be advised that RX refills may take up to 3 business days. We ask that you follow-up with your pharmacy.

## 2020-11-12 NOTE — Telephone Encounter (Signed)
Caller states she is not home right not to confirm the medication prescribed by Gerre Scull, NP on 11/11/2020 but it cost $400. Caller would like a follow up call to discuss please call back at [phone]

## 2020-11-15 DIAGNOSIS — Z96652 Presence of left artificial knee joint: Secondary | ICD-10-CM | POA: Diagnosis not present

## 2020-11-15 DIAGNOSIS — E559 Vitamin D deficiency, unspecified: Secondary | ICD-10-CM | POA: Diagnosis not present

## 2020-11-15 DIAGNOSIS — I2510 Atherosclerotic heart disease of native coronary artery without angina pectoris: Secondary | ICD-10-CM | POA: Diagnosis not present

## 2020-11-15 DIAGNOSIS — Z471 Aftercare following joint replacement surgery: Secondary | ICD-10-CM | POA: Diagnosis not present

## 2020-11-15 DIAGNOSIS — E538 Deficiency of other specified B group vitamins: Secondary | ICD-10-CM | POA: Diagnosis not present

## 2020-11-15 DIAGNOSIS — Z87891 Personal history of nicotine dependence: Secondary | ICD-10-CM | POA: Diagnosis not present

## 2020-11-15 DIAGNOSIS — E785 Hyperlipidemia, unspecified: Secondary | ICD-10-CM | POA: Diagnosis not present

## 2020-11-15 DIAGNOSIS — K219 Gastro-esophageal reflux disease without esophagitis: Secondary | ICD-10-CM | POA: Diagnosis not present

## 2020-11-15 DIAGNOSIS — Z7982 Long term (current) use of aspirin: Secondary | ICD-10-CM | POA: Diagnosis not present

## 2020-11-15 DIAGNOSIS — E1159 Type 2 diabetes mellitus with other circulatory complications: Secondary | ICD-10-CM | POA: Diagnosis not present

## 2020-11-15 DIAGNOSIS — Z791 Long term (current) use of non-steroidal anti-inflammatories (NSAID): Secondary | ICD-10-CM | POA: Diagnosis not present

## 2020-11-15 DIAGNOSIS — Z89611 Acquired absence of right leg above knee: Secondary | ICD-10-CM | POA: Diagnosis not present

## 2020-11-15 DIAGNOSIS — I700 Atherosclerosis of aorta: Secondary | ICD-10-CM | POA: Diagnosis not present

## 2020-11-15 DIAGNOSIS — R9431 Abnormal electrocardiogram [ECG] [EKG]: Secondary | ICD-10-CM | POA: Diagnosis not present

## 2020-11-15 DIAGNOSIS — Z7984 Long term (current) use of oral hypoglycemic drugs: Secondary | ICD-10-CM | POA: Diagnosis not present

## 2020-11-15 DIAGNOSIS — E1169 Type 2 diabetes mellitus with other specified complication: Secondary | ICD-10-CM | POA: Diagnosis not present

## 2020-11-15 DIAGNOSIS — I152 Hypertension secondary to endocrine disorders: Secondary | ICD-10-CM | POA: Diagnosis not present

## 2020-11-15 NOTE — Telephone Encounter (Signed)
He states both farxiga and jardiance are $400 per month. Gerilyn Pilgrim- Is there any financial assistance for either of these diabetic medications?

## 2020-11-17 DIAGNOSIS — E785 Hyperlipidemia, unspecified: Secondary | ICD-10-CM | POA: Diagnosis not present

## 2020-11-17 DIAGNOSIS — Z7984 Long term (current) use of oral hypoglycemic drugs: Secondary | ICD-10-CM | POA: Diagnosis not present

## 2020-11-17 DIAGNOSIS — Z471 Aftercare following joint replacement surgery: Secondary | ICD-10-CM | POA: Diagnosis not present

## 2020-11-17 DIAGNOSIS — I700 Atherosclerosis of aorta: Secondary | ICD-10-CM | POA: Diagnosis not present

## 2020-11-17 DIAGNOSIS — K219 Gastro-esophageal reflux disease without esophagitis: Secondary | ICD-10-CM | POA: Diagnosis not present

## 2020-11-17 DIAGNOSIS — R9431 Abnormal electrocardiogram [ECG] [EKG]: Secondary | ICD-10-CM | POA: Diagnosis not present

## 2020-11-17 DIAGNOSIS — Z7982 Long term (current) use of aspirin: Secondary | ICD-10-CM | POA: Diagnosis not present

## 2020-11-17 DIAGNOSIS — Z96652 Presence of left artificial knee joint: Secondary | ICD-10-CM | POA: Diagnosis not present

## 2020-11-17 DIAGNOSIS — Z791 Long term (current) use of non-steroidal anti-inflammatories (NSAID): Secondary | ICD-10-CM | POA: Diagnosis not present

## 2020-11-17 DIAGNOSIS — E559 Vitamin D deficiency, unspecified: Secondary | ICD-10-CM | POA: Diagnosis not present

## 2020-11-17 DIAGNOSIS — Z87891 Personal history of nicotine dependence: Secondary | ICD-10-CM | POA: Diagnosis not present

## 2020-11-17 DIAGNOSIS — E1169 Type 2 diabetes mellitus with other specified complication: Secondary | ICD-10-CM | POA: Diagnosis not present

## 2020-11-17 DIAGNOSIS — E1159 Type 2 diabetes mellitus with other circulatory complications: Secondary | ICD-10-CM | POA: Diagnosis not present

## 2020-11-17 DIAGNOSIS — E538 Deficiency of other specified B group vitamins: Secondary | ICD-10-CM | POA: Diagnosis not present

## 2020-11-17 DIAGNOSIS — I2510 Atherosclerotic heart disease of native coronary artery without angina pectoris: Secondary | ICD-10-CM | POA: Diagnosis not present

## 2020-11-17 DIAGNOSIS — Z89611 Acquired absence of right leg above knee: Secondary | ICD-10-CM | POA: Diagnosis not present

## 2020-11-17 DIAGNOSIS — I152 Hypertension secondary to endocrine disorders: Secondary | ICD-10-CM | POA: Diagnosis not present

## 2020-11-19 ENCOUNTER — Telehealth: Payer: Self-pay

## 2020-11-19 DIAGNOSIS — Z471 Aftercare following joint replacement surgery: Secondary | ICD-10-CM | POA: Diagnosis not present

## 2020-11-19 DIAGNOSIS — I2510 Atherosclerotic heart disease of native coronary artery without angina pectoris: Secondary | ICD-10-CM | POA: Diagnosis not present

## 2020-11-19 DIAGNOSIS — E559 Vitamin D deficiency, unspecified: Secondary | ICD-10-CM | POA: Diagnosis not present

## 2020-11-19 DIAGNOSIS — Z89611 Acquired absence of right leg above knee: Secondary | ICD-10-CM | POA: Diagnosis not present

## 2020-11-19 DIAGNOSIS — Z96652 Presence of left artificial knee joint: Secondary | ICD-10-CM | POA: Diagnosis not present

## 2020-11-19 DIAGNOSIS — E538 Deficiency of other specified B group vitamins: Secondary | ICD-10-CM | POA: Diagnosis not present

## 2020-11-19 DIAGNOSIS — Z7984 Long term (current) use of oral hypoglycemic drugs: Secondary | ICD-10-CM | POA: Diagnosis not present

## 2020-11-19 DIAGNOSIS — E1169 Type 2 diabetes mellitus with other specified complication: Secondary | ICD-10-CM | POA: Diagnosis not present

## 2020-11-19 DIAGNOSIS — K219 Gastro-esophageal reflux disease without esophagitis: Secondary | ICD-10-CM | POA: Diagnosis not present

## 2020-11-19 DIAGNOSIS — I700 Atherosclerosis of aorta: Secondary | ICD-10-CM | POA: Diagnosis not present

## 2020-11-19 DIAGNOSIS — Z7982 Long term (current) use of aspirin: Secondary | ICD-10-CM | POA: Diagnosis not present

## 2020-11-19 DIAGNOSIS — E1159 Type 2 diabetes mellitus with other circulatory complications: Secondary | ICD-10-CM | POA: Diagnosis not present

## 2020-11-19 DIAGNOSIS — R9431 Abnormal electrocardiogram [ECG] [EKG]: Secondary | ICD-10-CM | POA: Diagnosis not present

## 2020-11-19 DIAGNOSIS — Z791 Long term (current) use of non-steroidal anti-inflammatories (NSAID): Secondary | ICD-10-CM | POA: Diagnosis not present

## 2020-11-19 DIAGNOSIS — I152 Hypertension secondary to endocrine disorders: Secondary | ICD-10-CM | POA: Diagnosis not present

## 2020-11-19 DIAGNOSIS — E785 Hyperlipidemia, unspecified: Secondary | ICD-10-CM | POA: Diagnosis not present

## 2020-11-19 DIAGNOSIS — Z87891 Personal history of nicotine dependence: Secondary | ICD-10-CM | POA: Diagnosis not present

## 2020-11-19 NOTE — Telephone Encounter (Signed)
Okay to give verbal order.

## 2020-11-19 NOTE — Telephone Encounter (Signed)
Copied from CRM [id number]. Topic: Quick Communication - Home Health Verbal Orders >> Nov 19, 2020  3:23 PM Gaetana Michaelis A wrote: Caller/Agency: [facility]  Callback Number: [phone]  Requesting OT/PT/Skilled Nursing/Social Work/Speech Therapy: PT   Frequency: 2w2 1w2 endurance and gait training / continuation of care

## 2020-11-19 NOTE — Telephone Encounter (Signed)
Left message for Joey providing the verbal OK per Larae Grooms, NP for patient requested orders. Advised patient to give our office a call back if they have any questions or concerns regarding the voice message.

## 2020-11-23 DIAGNOSIS — E559 Vitamin D deficiency, unspecified: Secondary | ICD-10-CM | POA: Diagnosis not present

## 2020-11-23 DIAGNOSIS — E785 Hyperlipidemia, unspecified: Secondary | ICD-10-CM | POA: Diagnosis not present

## 2020-11-23 DIAGNOSIS — Z791 Long term (current) use of non-steroidal anti-inflammatories (NSAID): Secondary | ICD-10-CM | POA: Diagnosis not present

## 2020-11-23 DIAGNOSIS — Z7982 Long term (current) use of aspirin: Secondary | ICD-10-CM | POA: Diagnosis not present

## 2020-11-23 DIAGNOSIS — R9431 Abnormal electrocardiogram [ECG] [EKG]: Secondary | ICD-10-CM | POA: Diagnosis not present

## 2020-11-23 DIAGNOSIS — E538 Deficiency of other specified B group vitamins: Secondary | ICD-10-CM | POA: Diagnosis not present

## 2020-11-23 DIAGNOSIS — E1169 Type 2 diabetes mellitus with other specified complication: Secondary | ICD-10-CM | POA: Diagnosis not present

## 2020-11-23 DIAGNOSIS — I2510 Atherosclerotic heart disease of native coronary artery without angina pectoris: Secondary | ICD-10-CM | POA: Diagnosis not present

## 2020-11-23 DIAGNOSIS — I152 Hypertension secondary to endocrine disorders: Secondary | ICD-10-CM | POA: Diagnosis not present

## 2020-11-23 DIAGNOSIS — Z87891 Personal history of nicotine dependence: Secondary | ICD-10-CM | POA: Diagnosis not present

## 2020-11-23 DIAGNOSIS — K219 Gastro-esophageal reflux disease without esophagitis: Secondary | ICD-10-CM | POA: Diagnosis not present

## 2020-11-23 DIAGNOSIS — Z7984 Long term (current) use of oral hypoglycemic drugs: Secondary | ICD-10-CM | POA: Diagnosis not present

## 2020-11-23 DIAGNOSIS — Z96652 Presence of left artificial knee joint: Secondary | ICD-10-CM | POA: Diagnosis not present

## 2020-11-23 DIAGNOSIS — E1159 Type 2 diabetes mellitus with other circulatory complications: Secondary | ICD-10-CM | POA: Diagnosis not present

## 2020-11-23 DIAGNOSIS — I700 Atherosclerosis of aorta: Secondary | ICD-10-CM | POA: Diagnosis not present

## 2020-11-23 DIAGNOSIS — Z471 Aftercare following joint replacement surgery: Secondary | ICD-10-CM | POA: Diagnosis not present

## 2020-11-23 DIAGNOSIS — Z89611 Acquired absence of right leg above knee: Secondary | ICD-10-CM | POA: Diagnosis not present

## 2020-11-25 DIAGNOSIS — I152 Hypertension secondary to endocrine disorders: Secondary | ICD-10-CM | POA: Diagnosis not present

## 2020-11-25 DIAGNOSIS — E785 Hyperlipidemia, unspecified: Secondary | ICD-10-CM | POA: Diagnosis not present

## 2020-11-25 DIAGNOSIS — I2510 Atherosclerotic heart disease of native coronary artery without angina pectoris: Secondary | ICD-10-CM | POA: Diagnosis not present

## 2020-11-25 DIAGNOSIS — Z96652 Presence of left artificial knee joint: Secondary | ICD-10-CM | POA: Diagnosis not present

## 2020-11-25 DIAGNOSIS — K219 Gastro-esophageal reflux disease without esophagitis: Secondary | ICD-10-CM | POA: Diagnosis not present

## 2020-11-25 DIAGNOSIS — E538 Deficiency of other specified B group vitamins: Secondary | ICD-10-CM | POA: Diagnosis not present

## 2020-11-25 DIAGNOSIS — R9431 Abnormal electrocardiogram [ECG] [EKG]: Secondary | ICD-10-CM | POA: Diagnosis not present

## 2020-11-25 DIAGNOSIS — Z7984 Long term (current) use of oral hypoglycemic drugs: Secondary | ICD-10-CM | POA: Diagnosis not present

## 2020-11-25 DIAGNOSIS — Z791 Long term (current) use of non-steroidal anti-inflammatories (NSAID): Secondary | ICD-10-CM | POA: Diagnosis not present

## 2020-11-25 DIAGNOSIS — E559 Vitamin D deficiency, unspecified: Secondary | ICD-10-CM | POA: Diagnosis not present

## 2020-11-25 DIAGNOSIS — E1169 Type 2 diabetes mellitus with other specified complication: Secondary | ICD-10-CM | POA: Diagnosis not present

## 2020-11-25 DIAGNOSIS — E1159 Type 2 diabetes mellitus with other circulatory complications: Secondary | ICD-10-CM | POA: Diagnosis not present

## 2020-11-25 DIAGNOSIS — Z7982 Long term (current) use of aspirin: Secondary | ICD-10-CM | POA: Diagnosis not present

## 2020-11-25 DIAGNOSIS — Z87891 Personal history of nicotine dependence: Secondary | ICD-10-CM | POA: Diagnosis not present

## 2020-11-25 DIAGNOSIS — Z89611 Acquired absence of right leg above knee: Secondary | ICD-10-CM | POA: Diagnosis not present

## 2020-11-25 DIAGNOSIS — I700 Atherosclerosis of aorta: Secondary | ICD-10-CM | POA: Diagnosis not present

## 2020-11-25 DIAGNOSIS — Z471 Aftercare following joint replacement surgery: Secondary | ICD-10-CM | POA: Diagnosis not present

## 2020-11-30 ENCOUNTER — Telehealth: Payer: Self-pay

## 2020-11-30 DIAGNOSIS — E785 Hyperlipidemia, unspecified: Secondary | ICD-10-CM | POA: Diagnosis not present

## 2020-11-30 DIAGNOSIS — I700 Atherosclerosis of aorta: Secondary | ICD-10-CM | POA: Diagnosis not present

## 2020-11-30 DIAGNOSIS — Z791 Long term (current) use of non-steroidal anti-inflammatories (NSAID): Secondary | ICD-10-CM | POA: Diagnosis not present

## 2020-11-30 DIAGNOSIS — Z471 Aftercare following joint replacement surgery: Secondary | ICD-10-CM | POA: Diagnosis not present

## 2020-11-30 DIAGNOSIS — Z87891 Personal history of nicotine dependence: Secondary | ICD-10-CM | POA: Diagnosis not present

## 2020-11-30 DIAGNOSIS — I2510 Atherosclerotic heart disease of native coronary artery without angina pectoris: Secondary | ICD-10-CM | POA: Diagnosis not present

## 2020-11-30 DIAGNOSIS — Z89611 Acquired absence of right leg above knee: Secondary | ICD-10-CM | POA: Diagnosis not present

## 2020-11-30 DIAGNOSIS — E1169 Type 2 diabetes mellitus with other specified complication: Secondary | ICD-10-CM | POA: Diagnosis not present

## 2020-11-30 DIAGNOSIS — Z7984 Long term (current) use of oral hypoglycemic drugs: Secondary | ICD-10-CM | POA: Diagnosis not present

## 2020-11-30 DIAGNOSIS — K219 Gastro-esophageal reflux disease without esophagitis: Secondary | ICD-10-CM | POA: Diagnosis not present

## 2020-11-30 DIAGNOSIS — E1159 Type 2 diabetes mellitus with other circulatory complications: Secondary | ICD-10-CM | POA: Diagnosis not present

## 2020-11-30 DIAGNOSIS — Z96652 Presence of left artificial knee joint: Secondary | ICD-10-CM | POA: Diagnosis not present

## 2020-11-30 DIAGNOSIS — R9431 Abnormal electrocardiogram [ECG] [EKG]: Secondary | ICD-10-CM | POA: Diagnosis not present

## 2020-11-30 DIAGNOSIS — I152 Hypertension secondary to endocrine disorders: Secondary | ICD-10-CM | POA: Diagnosis not present

## 2020-11-30 DIAGNOSIS — Z7982 Long term (current) use of aspirin: Secondary | ICD-10-CM | POA: Diagnosis not present

## 2020-11-30 DIAGNOSIS — E538 Deficiency of other specified B group vitamins: Secondary | ICD-10-CM | POA: Diagnosis not present

## 2020-11-30 DIAGNOSIS — E559 Vitamin D deficiency, unspecified: Secondary | ICD-10-CM | POA: Diagnosis not present

## 2020-11-30 NOTE — Chronic Care Management (AMB) (Signed)
    Chronic Care Management Pharmacy Assistant   Name: Carl Bryan  MRN: [id number] DOB: Feb 02, 1946    Reason for Encounter: Following up on Farxiga Patient Assistance Application.    Medications: Outpatient Encounter Medications as of 11/30/2020  Medication Sig   ACCU-CHEK AVIVA PLUS test strip Check 1 time a day   allopurinol (ZYLOPRIM) 300 MG tablet Take 1 tablet (300 mg total) by mouth daily.   amLODipine (NORVASC) 10 MG tablet Take 1 tablet (10 mg total) by mouth daily.   blood glucose meter kit and supplies KIT To check blood sugar twice a day. (FOR ICD-9 250.00, 250.01).   Blood Glucose Monitoring Suppl (ACCU-CHEK AVIVA PLUS) w/Device KIT Test 1 time daily   empagliflozin (JARDIANCE) 10 MG TABS tablet Take 1 tablet (10 mg total) by mouth daily before breakfast.   glipiZIDE (GLUCOTROL) 5 MG tablet Take 1 tablet (5 mg total) by mouth daily before breakfast.   losartan (COZAAR) 100 MG tablet Take 1 tablet (100 mg total) by mouth daily.   meloxicam (MOBIC) 15 MG tablet Take 1 tablet (15 mg total) by mouth daily.   metFORMIN (GLUCOPHAGE) 500 MG tablet Take 2 tablets (1,000 mg total) by mouth 2 (two) times daily with a meal.   pantoprazole (PROTONIX) 40 MG tablet TAKE 1 TABLET 2 TIMES DAILY BEFORE A MEAL.   rosuvastatin (CRESTOR) 40 MG tablet Take 1 tablet (40 mg total) by mouth daily.   tamsulosin (FLOMAX) 0.4 MG CAPS capsule Take 1 capsule (0.4 mg total) by mouth daily.   vitamin B-12 (CYANOCOBALAMIN) 1000 MCG tablet Take 1 tablet (1,000 mcg total) by mouth daily.   VITAMIN D, CHOLECALCIFEROL, PO Take 2,000 Units by mouth.   No facility-administered encounter medications on file as of 11/30/2020.    Contacted manufacture [place]. They stated that the application has not been received. I have attempted to call patient but did not successfully reach the patient I have left voicemail's for patient to return my call and to see if he still has the application with him so we can process the  application for him.  Corrie Mckusick, [place]

## 2020-12-02 DIAGNOSIS — K219 Gastro-esophageal reflux disease without esophagitis: Secondary | ICD-10-CM | POA: Diagnosis not present

## 2020-12-02 DIAGNOSIS — E559 Vitamin D deficiency, unspecified: Secondary | ICD-10-CM | POA: Diagnosis not present

## 2020-12-02 DIAGNOSIS — Z87891 Personal history of nicotine dependence: Secondary | ICD-10-CM | POA: Diagnosis not present

## 2020-12-02 DIAGNOSIS — I152 Hypertension secondary to endocrine disorders: Secondary | ICD-10-CM | POA: Diagnosis not present

## 2020-12-02 DIAGNOSIS — Z7984 Long term (current) use of oral hypoglycemic drugs: Secondary | ICD-10-CM | POA: Diagnosis not present

## 2020-12-02 DIAGNOSIS — E538 Deficiency of other specified B group vitamins: Secondary | ICD-10-CM | POA: Diagnosis not present

## 2020-12-02 DIAGNOSIS — Z791 Long term (current) use of non-steroidal anti-inflammatories (NSAID): Secondary | ICD-10-CM | POA: Diagnosis not present

## 2020-12-02 DIAGNOSIS — E1169 Type 2 diabetes mellitus with other specified complication: Secondary | ICD-10-CM | POA: Diagnosis not present

## 2020-12-02 DIAGNOSIS — Z89611 Acquired absence of right leg above knee: Secondary | ICD-10-CM | POA: Diagnosis not present

## 2020-12-02 DIAGNOSIS — Z7982 Long term (current) use of aspirin: Secondary | ICD-10-CM | POA: Diagnosis not present

## 2020-12-02 DIAGNOSIS — Z471 Aftercare following joint replacement surgery: Secondary | ICD-10-CM | POA: Diagnosis not present

## 2020-12-02 DIAGNOSIS — E1159 Type 2 diabetes mellitus with other circulatory complications: Secondary | ICD-10-CM | POA: Diagnosis not present

## 2020-12-02 DIAGNOSIS — E785 Hyperlipidemia, unspecified: Secondary | ICD-10-CM | POA: Diagnosis not present

## 2020-12-02 DIAGNOSIS — R9431 Abnormal electrocardiogram [ECG] [EKG]: Secondary | ICD-10-CM | POA: Diagnosis not present

## 2020-12-02 DIAGNOSIS — Z96652 Presence of left artificial knee joint: Secondary | ICD-10-CM | POA: Diagnosis not present

## 2020-12-02 DIAGNOSIS — I2510 Atherosclerotic heart disease of native coronary artery without angina pectoris: Secondary | ICD-10-CM | POA: Diagnosis not present

## 2020-12-02 DIAGNOSIS — I700 Atherosclerosis of aorta: Secondary | ICD-10-CM | POA: Diagnosis not present

## 2020-12-09 DIAGNOSIS — Z471 Aftercare following joint replacement surgery: Secondary | ICD-10-CM | POA: Diagnosis not present

## 2020-12-09 DIAGNOSIS — I2510 Atherosclerotic heart disease of native coronary artery without angina pectoris: Secondary | ICD-10-CM | POA: Diagnosis not present

## 2020-12-09 DIAGNOSIS — I152 Hypertension secondary to endocrine disorders: Secondary | ICD-10-CM | POA: Diagnosis not present

## 2020-12-09 DIAGNOSIS — Z791 Long term (current) use of non-steroidal anti-inflammatories (NSAID): Secondary | ICD-10-CM | POA: Diagnosis not present

## 2020-12-09 DIAGNOSIS — Z7984 Long term (current) use of oral hypoglycemic drugs: Secondary | ICD-10-CM | POA: Diagnosis not present

## 2020-12-09 DIAGNOSIS — E1159 Type 2 diabetes mellitus with other circulatory complications: Secondary | ICD-10-CM | POA: Diagnosis not present

## 2020-12-09 DIAGNOSIS — E559 Vitamin D deficiency, unspecified: Secondary | ICD-10-CM | POA: Diagnosis not present

## 2020-12-09 DIAGNOSIS — E538 Deficiency of other specified B group vitamins: Secondary | ICD-10-CM | POA: Diagnosis not present

## 2020-12-09 DIAGNOSIS — K219 Gastro-esophageal reflux disease without esophagitis: Secondary | ICD-10-CM | POA: Diagnosis not present

## 2020-12-09 DIAGNOSIS — R9431 Abnormal electrocardiogram [ECG] [EKG]: Secondary | ICD-10-CM | POA: Diagnosis not present

## 2020-12-09 DIAGNOSIS — E1169 Type 2 diabetes mellitus with other specified complication: Secondary | ICD-10-CM | POA: Diagnosis not present

## 2020-12-09 DIAGNOSIS — E785 Hyperlipidemia, unspecified: Secondary | ICD-10-CM | POA: Diagnosis not present

## 2020-12-09 DIAGNOSIS — Z96652 Presence of left artificial knee joint: Secondary | ICD-10-CM | POA: Diagnosis not present

## 2020-12-09 DIAGNOSIS — Z89611 Acquired absence of right leg above knee: Secondary | ICD-10-CM | POA: Diagnosis not present

## 2020-12-09 DIAGNOSIS — Z87891 Personal history of nicotine dependence: Secondary | ICD-10-CM | POA: Diagnosis not present

## 2020-12-09 DIAGNOSIS — Z7982 Long term (current) use of aspirin: Secondary | ICD-10-CM | POA: Diagnosis not present

## 2020-12-09 DIAGNOSIS — I700 Atherosclerosis of aorta: Secondary | ICD-10-CM | POA: Diagnosis not present

## 2020-12-15 DIAGNOSIS — R9431 Abnormal electrocardiogram [ECG] [EKG]: Secondary | ICD-10-CM | POA: Diagnosis not present

## 2020-12-15 DIAGNOSIS — Z7982 Long term (current) use of aspirin: Secondary | ICD-10-CM | POA: Diagnosis not present

## 2020-12-15 DIAGNOSIS — I700 Atherosclerosis of aorta: Secondary | ICD-10-CM | POA: Diagnosis not present

## 2020-12-15 DIAGNOSIS — E1159 Type 2 diabetes mellitus with other circulatory complications: Secondary | ICD-10-CM | POA: Diagnosis not present

## 2020-12-15 DIAGNOSIS — Z791 Long term (current) use of non-steroidal anti-inflammatories (NSAID): Secondary | ICD-10-CM | POA: Diagnosis not present

## 2020-12-15 DIAGNOSIS — Z471 Aftercare following joint replacement surgery: Secondary | ICD-10-CM | POA: Diagnosis not present

## 2020-12-15 DIAGNOSIS — E1169 Type 2 diabetes mellitus with other specified complication: Secondary | ICD-10-CM | POA: Diagnosis not present

## 2020-12-15 DIAGNOSIS — I2510 Atherosclerotic heart disease of native coronary artery without angina pectoris: Secondary | ICD-10-CM | POA: Diagnosis not present

## 2020-12-15 DIAGNOSIS — Z96652 Presence of left artificial knee joint: Secondary | ICD-10-CM | POA: Diagnosis not present

## 2020-12-15 DIAGNOSIS — K219 Gastro-esophageal reflux disease without esophagitis: Secondary | ICD-10-CM | POA: Diagnosis not present

## 2020-12-15 DIAGNOSIS — E538 Deficiency of other specified B group vitamins: Secondary | ICD-10-CM | POA: Diagnosis not present

## 2020-12-15 DIAGNOSIS — E785 Hyperlipidemia, unspecified: Secondary | ICD-10-CM | POA: Diagnosis not present

## 2020-12-15 DIAGNOSIS — E559 Vitamin D deficiency, unspecified: Secondary | ICD-10-CM | POA: Diagnosis not present

## 2020-12-15 DIAGNOSIS — Z7984 Long term (current) use of oral hypoglycemic drugs: Secondary | ICD-10-CM | POA: Diagnosis not present

## 2020-12-15 DIAGNOSIS — Z87891 Personal history of nicotine dependence: Secondary | ICD-10-CM | POA: Diagnosis not present

## 2020-12-15 DIAGNOSIS — Z89611 Acquired absence of right leg above knee: Secondary | ICD-10-CM | POA: Diagnosis not present

## 2020-12-15 DIAGNOSIS — I152 Hypertension secondary to endocrine disorders: Secondary | ICD-10-CM | POA: Diagnosis not present

## 2020-12-16 ENCOUNTER — Ambulatory Visit: Payer: Medicare Other | Admitting: Nurse Practitioner

## 2020-12-16 NOTE — Progress Notes (Deleted)
   There were no vitals taken for this visit.   Subjective:    Patient ID: Carl Bryan, male    DOB: 1945/10/23, 75 y.o.   MRN: [id number]  HPI: Carl Bryan is a 75 y.o. male  No chief complaint on file.  DIABETES Hypoglycemic episodes:{Blank single:19197::"yes","no"} Polydipsia/polyuria: {Blank single:19197::"yes","no"} Visual disturbance: {Blank single:19197::"yes","no"} Chest pain: {Blank single:19197::"yes","no"} Paresthesias: {Blank single:19197::"yes","no"} Glucose Monitoring: {Blank single:19197::"yes","no"}  Accucheck frequency: {Blank single:19197::"Not Checking","Daily","BID","TID"}  Fasting glucose:  Post prandial:  Evening:  Before meals: Taking Insulin?: {Blank single:19197::"yes","no"}  Long acting insulin:  Short acting insulin: Blood Pressure Monitoring: {Blank single:19197::"not checking","rarely","daily","weekly","monthly","a few times a day","a few times a week","a few times a month"} Retinal Examination: {Blank single:19197::"Up to Date","Not up to Date"} Foot Exam: {Blank single:19197::"Up to Date","Not up to Date"} Diabetic Education: {Blank single:19197::"Completed","Not Completed"} Pneumovax: {Blank single:19197::"Up to Date","Not up to Date","unknown"} Influenza: {Blank single:19197::"Up to Date","Not up to Date","unknown"} Aspirin: {Blank single:19197::"yes","no"}  HYPERTENSION / HYPERLIPIDEMIA Satisfied with current treatment? {Blank single:19197::"yes","no"} Duration of hypertension: {Blank single:19197::"chronic","months","years"} BP monitoring frequency: {Blank single:19197::"not checking","rarely","daily","weekly","monthly","a few times a day","a few times a week","a few times a month"} BP range:  BP medication side effects: {Blank single:19197::"yes","no"} Past BP meds: {Blank multiple:19196::"none","amlodipine","amlodipine/benazepril","atenolol","benazepril","benazepril/HCTZ","bisoprolol  (bystolic)","carvedilol","chlorthalidone","clonidine","diltiazem","exforge HCT","HCTZ","irbesartan (avapro)","labetalol","lisinopril","lisinopril-HCTZ","losartan (cozaar)","methyldopa","nifedipine","olmesartan (benicar)","olmesartan-HCTZ","quinapril","ramipril","spironalactone","tekturna","valsartan","valsartan-HCTZ","verapamil"} Duration of hyperlipidemia: {Blank single:19197::"chronic","months","years"} Cholesterol medication side effects: {Blank single:19197::"yes","no"} Cholesterol supplements: {Blank multiple:19196::"none","fish oil","niacin","red yeast rice"} Past cholesterol medications: {Blank multiple:19196::"none","atorvastain (lipitor)","lovastatin (mevacor)","pravastatin (pravachol)","rosuvastatin (crestor)","simvastatin (zocor)","vytorin","fenofibrate (tricor)","gemfibrozil","ezetimide (zetia)","niaspan","lovaza"} Medication compliance: {Blank single:19197::"excellent compliance","good compliance","fair compliance","poor compliance"} Aspirin: {Blank single:19197::"yes","no"} Recent stressors: {Blank single:19197::"yes","no"} Recurrent headaches: {Blank single:19197::"yes","no"} Visual changes: {Blank single:19197::"yes","no"} Palpitations: {Blank single:19197::"yes","no"} Dyspnea: {Blank single:19197::"yes","no"} Chest pain: {Blank single:19197::"yes","no"} Lower extremity edema: {Blank single:19197::"yes","no"} Dizzy/lightheaded: {Blank single:19197::"yes","no"}   Relevant past medical, surgical, family and social history reviewed and updated as indicated. Interim medical history since our last visit reviewed. Allergies and medications reviewed and updated.  Review of Systems  Per HPI unless specifically indicated above     Objective:    There were no vitals taken for this visit.  Wt Readings from Last 3 Encounters:  11/11/20 156 lb 3.2 oz (70.9 kg)  09/13/20 163 lb (73.9 kg)  09/09/20 163 lb (73.9 kg)    Physical Exam  Results for orders placed or performed during  the hospital encounter of 09/23/20  NM Myocar Multi W/Spect W/Wall Motion / EF  Result Value Ref Range   Rest HR 61 bpm   Rest BP 159/77 mmHg   Percent HR 67 %   Peak HR 97 bpm   Peak BP 165/68 mmHg   SSS 2    SRS 10    SDS 1    TID 0.89    LV sys vol 21 mL   LV dias vol 63 62 - 150 mL      Assessment & Plan:   Problem List Items Addressed This Visit   None    Follow up plan: No follow-ups on file.

## 2021-02-09 ENCOUNTER — Telehealth: Payer: Self-pay | Admitting: Nurse Practitioner

## 2021-02-09 NOTE — Telephone Encounter (Signed)
Copied from CRM [id number]. Topic: General - Call Back - No Documentation >> Feb 09, 2021 11:50 AM Randol Kern wrote: Reason for CRM: Maisie Fus calling from [facility] called to follow up on fax submission from 02/02/2021 on detailed written order for diabetic supplies and clinical notes  Best contact: [phone] reference: [id number]  Fax: [phone] (Clinical Notes) Fax: [phone] (Detailed Written Order)

## 2021-02-15 NOTE — Telephone Encounter (Signed)
Faxed over documents.

## 2021-02-22 IMAGING — CT CT CERVICAL SPINE W/O CM
3 of 4 series · 12 of 33 positions shown, 14 images · non-contrast
Comparison: 11/20/2018

CLINICAL DATA: Head trauma, multiple falls at home, incontinent,
cervical spine trauma high clinical risk

EXAM:
CT HEAD WITHOUT CONTRAST
CT CERVICAL SPINE WITHOUT CONTRAST
TECHNIQUE: Multidetector CT imaging of the head and cervical spine was
performed following the standard protocol without intravenous
contrast. Multiplanar CT image reconstructions of the cervical spine
were also generated.

[Series 3: sagittal bone · sagittal · 0.27mm/px · 5 of 57 slices shown, 6 images]
[im 19/57  bone]
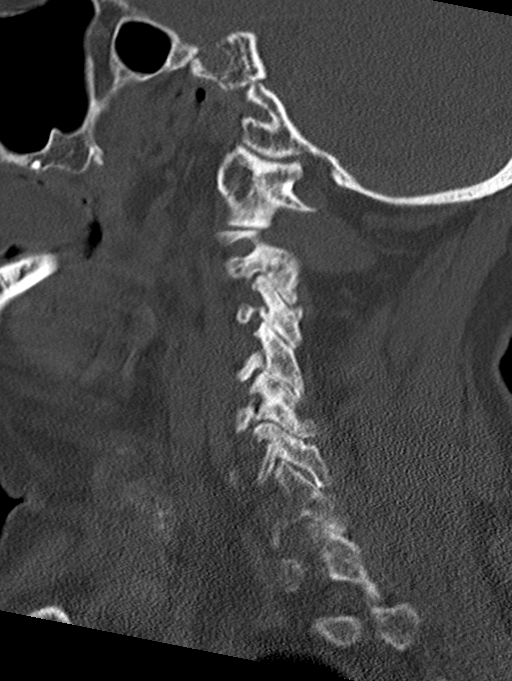
[im 24/57  bone]
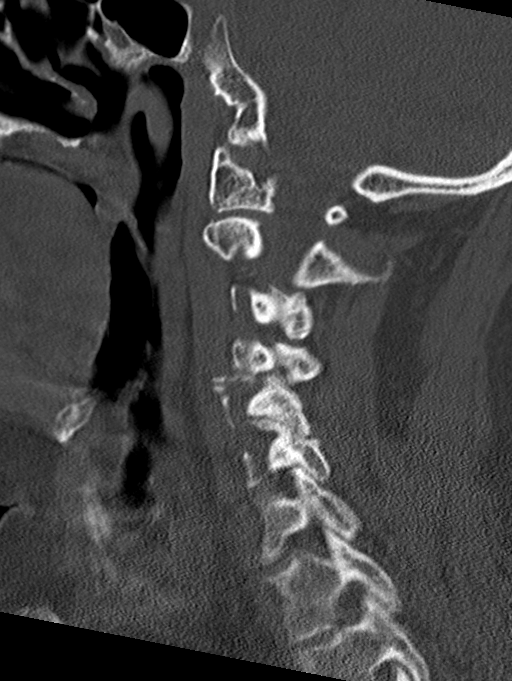
[im 29/57  soft-tissue]
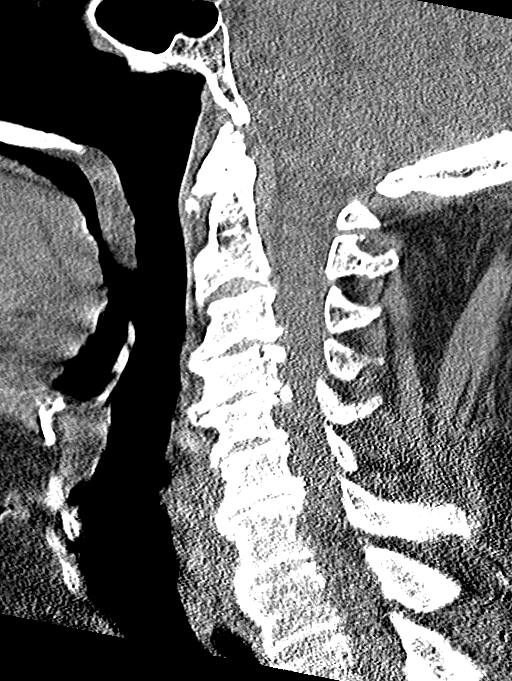
[im 29/57  bone]
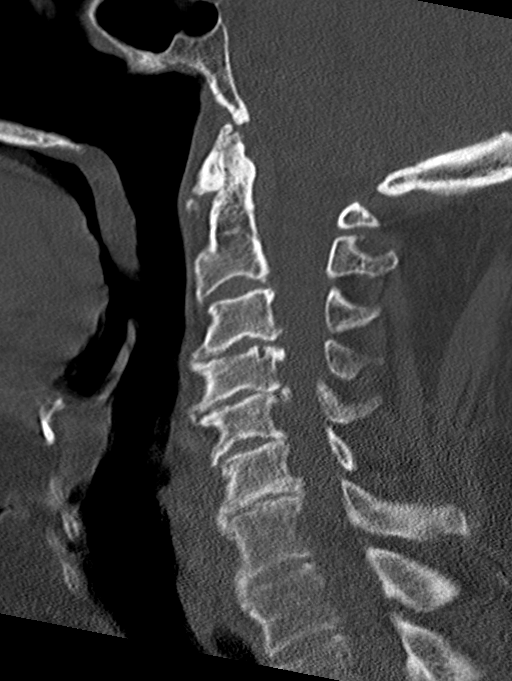
[im 33/57  bone]
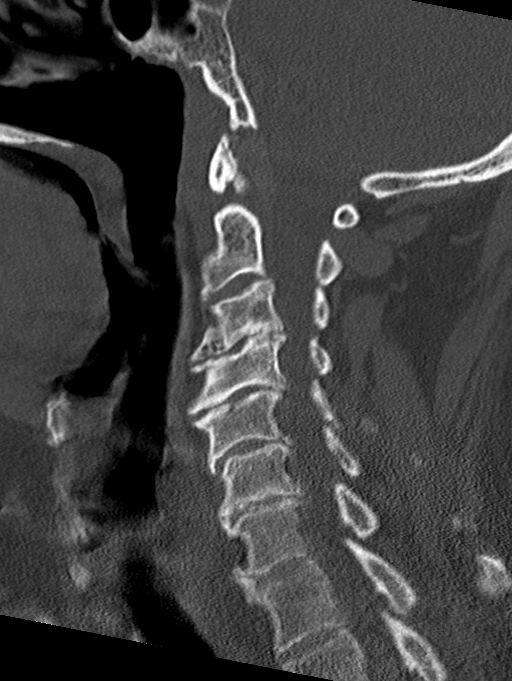
[im 38/57  bone]
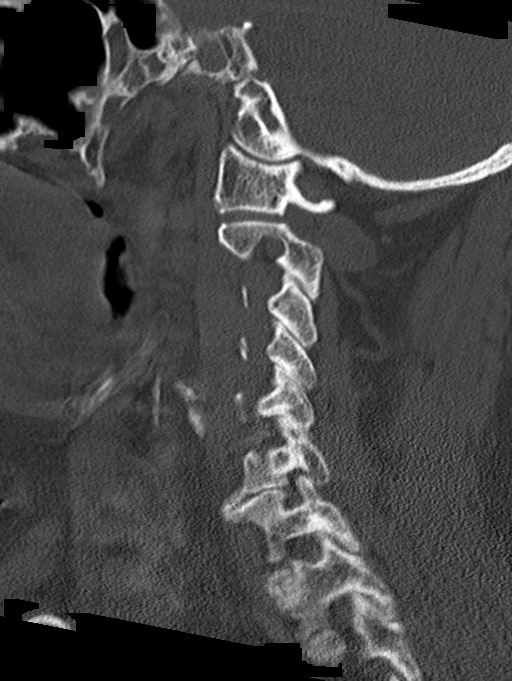

[Series 5: coronal bone · coronal · 0.24mm/px · 3 of 47 slices shown]
[im 10/47  bone]
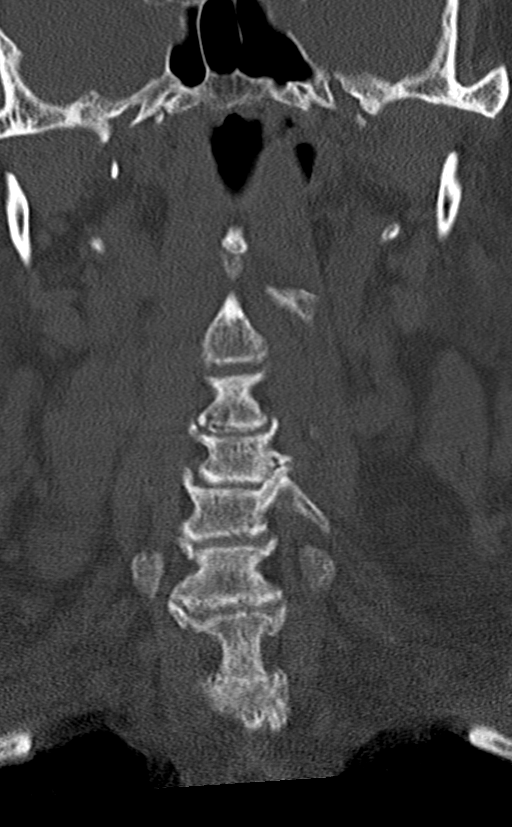
[im 19/47  bone]
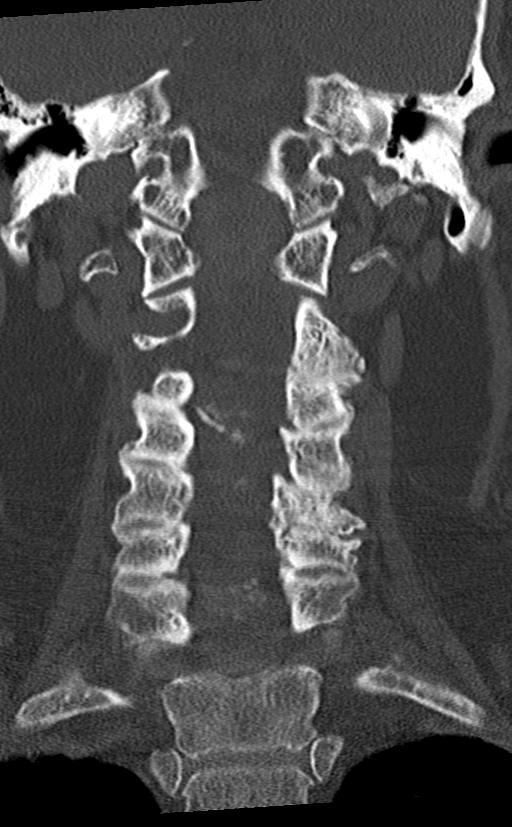
[im 28/47  bone]
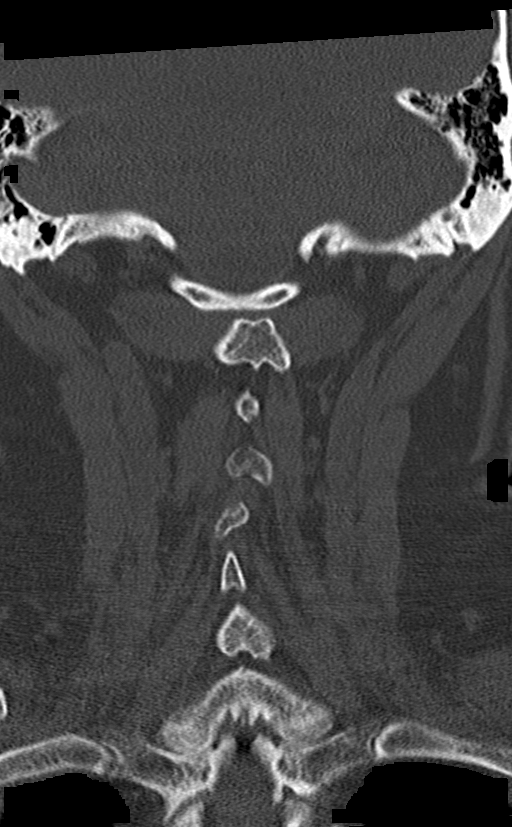

[Series 6: orthogonal bone · axial · 0.23mm/px · z∈[-276,-161]mm · 4 of 93 slices shown, 5 images]
[im 16/93  soft-tissue]
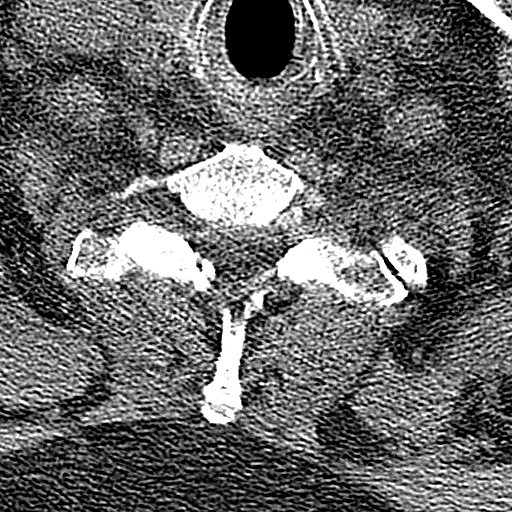
[im 16/93  bone]
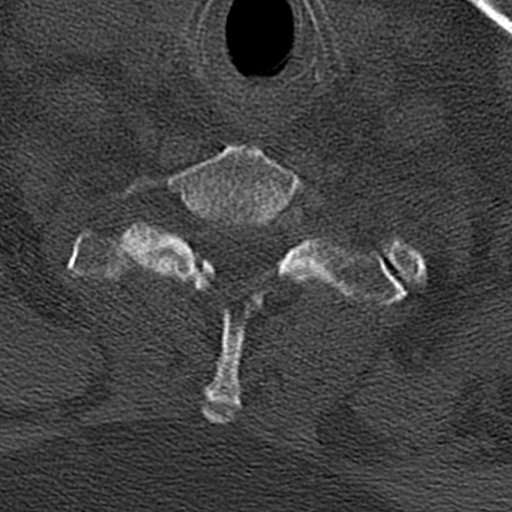
[im 31/93  bone]
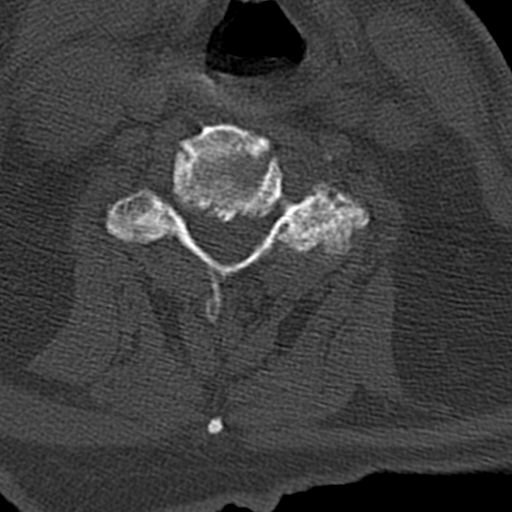
[im 62/93  bone]
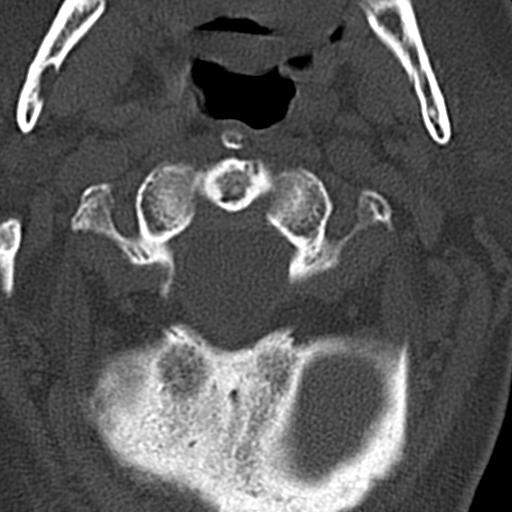
[im 77/93  bone]
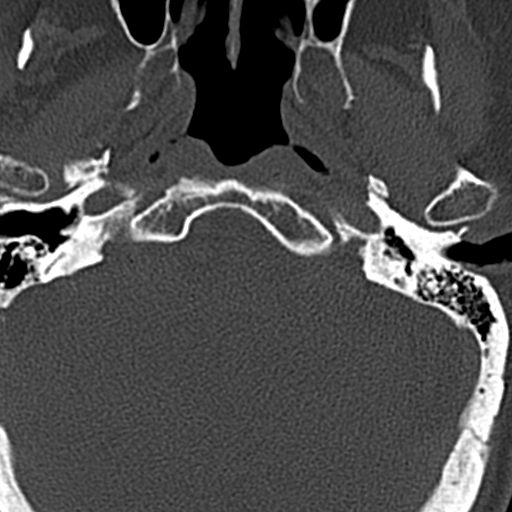

[12 of 33 positions shown; findings below may reference images not displayed]

FINDINGS: CT HEAD FINDINGS

Brain: Generalized atrophy. Normal ventricular morphology. No
midline shift or mass effect. Small vessel chronic ischemic changes
of deep cerebral white matter. Otherwise normal appearance of brain
parenchyma. No intracranial hemorrhage, mass lesion or evidence of
acute infarction. No extra-axial fluid collections.

Vascular: No hyperdense vessels.

Skull: Intact

Sinuses/Orbits: Clear

Other: N/A

CT CERVICAL SPINE FINDINGS

Alignment: Minimal retrolisthesis C4-C5. Remaining alignments normal

Skull base and vertebrae: Osseous demineralization. Multilevel facet
degenerative changes. Multilevel disc space narrowing and endplate
spur formation diffusely throughout cervical spine. Vertebral body
heights maintained. No fracture, additional subluxation, or bone
destruction. Minimal scattered motion artifacts.

Soft tissues and spinal canal: Prevertebral soft tissues normal
thickness.

Disc levels: Endplate spurs encroach upon the anterior aspect of the
spinal canal and thecal sac at multiple levels.

Upper chest: Lung apices clear

Other: N/A
IMPRESSION: Atrophy with minimal small vessel chronic ischemic changes of deep
cerebral white matter.

No acute intracranial abnormalities.

Degenerative disc and facet disease changes throughout cervical
spine as above.

No acute cervical spine abnormalities.

## 2021-02-23 ENCOUNTER — Other Ambulatory Visit: Payer: Self-pay

## 2021-02-23 ENCOUNTER — Ambulatory Visit (INDEPENDENT_AMBULATORY_CARE_PROVIDER_SITE_OTHER): Payer: Medicare Other

## 2021-02-23 DIAGNOSIS — Z23 Encounter for immunization: Secondary | ICD-10-CM | POA: Diagnosis not present

## 2021-02-24 DIAGNOSIS — E119 Type 2 diabetes mellitus without complications: Secondary | ICD-10-CM | POA: Diagnosis not present

## 2021-03-01 ENCOUNTER — Ambulatory Visit: Payer: Self-pay | Admitting: *Deleted

## 2021-03-01 NOTE — Telephone Encounter (Signed)
Is he able to come in and see Lauren this afternoon to discuss medication options.

## 2021-03-01 NOTE — Telephone Encounter (Signed)
Called pt to discuss appointment options to see provider; unable to leave vm.

## 2021-03-01 NOTE — Telephone Encounter (Signed)
Pt reports diarrhea "From the metformin." Reports 6 episodes last 24 hours. Reports diarrhea occurs off and on. States was taking 2 in am and 2 at HS, changed to 1 in Am and 2 at hs. Reports "Better for awhile, then started up again." States a pattern with dose changes. "Gets better than worse." Denies fever, no abdominal pain, no antibiotics,states is able to eat and stay hydrated. BS ranging 80s-120. Pt states "I know it's this metformin." Assured pt NT would route to practice for PCPs review and final disposition. Care advise given, pt Verbalizes understanding.  Please advise: [phone]       Reason for Disposition  [1] MODERATE diarrhea (e.g., 4-6 times / day more than normal) AND [2] age > 70 years  Answer Assessment - Initial Assessment Questions 1. DIARRHEA SEVERITY: "How bad is the diarrhea?" "How many more stools have you had in the past 24 hours than normal?"    - NO DIARRHEA (SCALE 0)   - MILD (SCALE 1-3): Few loose or mushy BMs; increase of 1-3 stools over normal daily number of stools; mild increase in ostomy output.   -  MODERATE (SCALE 4-7): Increase of 4-6 stools daily over normal; moderate increase in ostomy output. * SEVERE (SCALE 8-10; OR 'WORST POSSIBLE'): Increase of 7 or more stools daily over normal; moderate increase in ostomy output; incontinence.     6 times 2. ONSET: "When did the diarrhea begin?"      Off and on 3. BM CONSISTENCY: "How loose or watery is the diarrhea?"      watery 4. VOMITING: "Are you also vomiting?" If Yes, ask: "How many times in the past 24 hours?"      no 5. ABDOMINAL PAIN: "Are you having any abdominal pain?" If Yes, ask: "What does it feel like?" (e.g., crampy, dull, intermittent, constant)      no 6. ABDOMINAL PAIN SEVERITY: If present, ask: "How bad is the pain?"  (e.g., Scale 1-10; mild, moderate, or severe)   - MILD (1-3): doesn't interfere with normal activities, abdomen soft and not tender to touch    - MODERATE (4-7):  interferes with normal activities or awakens from sleep, abdomen tender to touch    - SEVERE (8-10): excruciating pain, doubled over, unable to do any normal activities       NA 7. ORAL INTAKE: If vomiting, "Have you been able to drink liquids?" "How much liquids have you had in the past 24 hours?"     yes 8. HYDRATION: "Any signs of dehydration?" (e.g., dry mouth [not just dry lips], too weak to stand, dizziness, new weight loss) "When did you last urinate?"     no  10. ANTIBIOTIC USE: "Are you taking antibiotics now or have you taken antibiotics in the past 2 months?"       no 11. OTHER SYMPTOMS: "Do you have any other symptoms?" (e.g., fever, blood in stool)       no  Protocols used: Diarrhea-A-AH

## 2021-03-07 ENCOUNTER — Ambulatory Visit: Payer: Medicare Other | Admitting: Family Medicine

## 2021-03-08 ENCOUNTER — Encounter: Payer: Self-pay | Admitting: Nurse Practitioner

## 2021-03-08 ENCOUNTER — Other Ambulatory Visit: Payer: Self-pay

## 2021-03-08 ENCOUNTER — Ambulatory Visit (INDEPENDENT_AMBULATORY_CARE_PROVIDER_SITE_OTHER): Payer: Medicare Other | Admitting: Nurse Practitioner

## 2021-03-08 VITALS — BP 150/70 | HR 49 | Temp 97.9°F | Ht 66.0 in | Wt 156.5 lb

## 2021-03-08 DIAGNOSIS — D519 Vitamin B12 deficiency anemia, unspecified: Secondary | ICD-10-CM | POA: Diagnosis not present

## 2021-03-08 DIAGNOSIS — E785 Hyperlipidemia, unspecified: Secondary | ICD-10-CM

## 2021-03-08 DIAGNOSIS — I152 Hypertension secondary to endocrine disorders: Secondary | ICD-10-CM | POA: Diagnosis not present

## 2021-03-08 DIAGNOSIS — E1169 Type 2 diabetes mellitus with other specified complication: Secondary | ICD-10-CM | POA: Diagnosis not present

## 2021-03-08 DIAGNOSIS — E1159 Type 2 diabetes mellitus with other circulatory complications: Secondary | ICD-10-CM

## 2021-03-08 DIAGNOSIS — I700 Atherosclerosis of aorta: Secondary | ICD-10-CM | POA: Diagnosis not present

## 2021-03-08 DIAGNOSIS — E559 Vitamin D deficiency, unspecified: Secondary | ICD-10-CM

## 2021-03-08 DIAGNOSIS — E538 Deficiency of other specified B group vitamins: Secondary | ICD-10-CM | POA: Diagnosis not present

## 2021-03-08 DIAGNOSIS — I2510 Atherosclerotic heart disease of native coronary artery without angina pectoris: Secondary | ICD-10-CM

## 2021-03-08 DIAGNOSIS — I2583 Coronary atherosclerosis due to lipid rich plaque: Secondary | ICD-10-CM

## 2021-03-08 NOTE — Assessment & Plan Note (Signed)
Chronic.  Controlled.  Continue with current medication regimen of Crestor 40mg.   Labs ordered today.  Return to clinic in 6 months for reevaluation.  Call sooner if concerns arise.   

## 2021-03-08 NOTE — Assessment & Plan Note (Signed)
Chronic.  Under good control on current regimen. Continue current regimen of Crestor 40mg . Continue to monitor. Call with any concerns. Labs drawn today.

## 2021-03-08 NOTE — Assessment & Plan Note (Signed)
Chronic.  Controlled.  Continue with current medication regimen on Crestor 40mg.  Labs ordered today.  Return to clinic in 3 months for reevaluation.  Call sooner if concerns arise.   

## 2021-03-08 NOTE — Assessment & Plan Note (Signed)
Chronic.  Last A1c was 8.1.  Having problems with diarrhea due to the Metformin.  Will check labs at visit today.  If A1c is less than 8 will stop the Metformin.  If A1c is greater than 8 will add Ozempic.  Continue with current medication regimen.  Labs ordered today.  Return to clinic in 3 months for reevaluation.  Call sooner if concerns arise.

## 2021-03-08 NOTE — Progress Notes (Signed)
hhhhhhhhhhh  BP (!) 150/70   Pulse (!) 49   Temp 97.9 F (36.6 C) (Oral)   Ht '5\' 6"'  (1.676 m)   Wt 156 lb 8 oz (71 kg)   SpO2 97%   BMI 25.26 kg/m    Subjective:    Patient ID: Carl Bryan, male    DOB: 1945/08/04, 75 y.o.   MRN: [id number]  HPI: Carl Bryan is a 75 y.o. male  Chief Complaint  Patient presents with   Diabetes    Questions about diabetes medication   HYPERTENSION / [place] Satisfied with current treatment? yes Duration of hypertension: years BP monitoring frequency: daily BP range:  BP medication side effects: no Past BP meds: amlodipine and losartan (cozaar) Duration of hyperlipidemia: years Cholesterol medication side effects: no Cholesterol supplements: none Past cholesterol medications: rosuvastatin (crestor) Medication compliance: excellent compliance Aspirin: no Recent stressors: no Recurrent headaches: no Visual changes: no Palpitations: no Dyspnea: no Chest pain: no Lower extremity edema: no Dizzy/lightheaded: no  DIABETES Patient states the Metformin is causing him to have "the runs".  He has tried decreasing the dose but it is still causing him to have bathroom problems. Wondering if he can stop the medication. Hypoglycemic episodes:no Polydipsia/polyuria: no Visual disturbance: no Chest pain: no Paresthesias: no Glucose Monitoring: yes  Accucheck frequency: Daily  Fasting glucose: 127 today  Post prandial:  Evening:  Before meals: Taking Insulin?: no  Long acting insulin:  Short acting insulin: Blood Pressure Monitoring: daily Retinal Examination: Up to Date- will get report Foot Exam: Not up to Date Diabetic Education: Not Completed Pneumovax: Up to Date Influenza: Up to Date Aspirin: no  Relevant past medical, surgical, family and social history reviewed and updated as indicated. Interim medical history since our last visit reviewed. Allergies and medications reviewed and updated.  Review of Systems  Eyes:   Negative for visual disturbance.  Respiratory:  Negative for chest tightness and shortness of breath.   Cardiovascular:  Negative for chest pain, palpitations and leg swelling.  Gastrointestinal:  Positive for diarrhea.  Endocrine: Negative for polydipsia and polyuria.  Neurological:  Negative for dizziness, light-headedness, numbness and headaches.   Per HPI unless specifically indicated above     Objective:    BP (!) 150/70   Pulse (!) 49   Temp 97.9 F (36.6 C) (Oral)   Ht '5\' 6"'  (1.676 m)   Wt 156 lb 8 oz (71 kg)   SpO2 97%   BMI 25.26 kg/m   Wt Readings from Last 3 Encounters:  03/08/21 156 lb 8 oz (71 kg)  11/11/20 156 lb 3.2 oz (70.9 kg)  09/13/20 163 lb (73.9 kg)    Physical Exam Vitals and nursing note reviewed.  Constitutional:      General: He is not in acute distress.    Appearance: Normal appearance. He is not ill-appearing, toxic-appearing or diaphoretic.  HENT:     Head: Normocephalic.     Right Ear: External ear normal.     Left Ear: External ear normal.     Nose: Nose normal. No congestion or rhinorrhea.     Mouth/Throat:     Mouth: Mucous membranes are moist.  Eyes:     General:        Right eye: No discharge.        Left eye: No discharge.     Extraocular Movements: Extraocular movements intact.     Conjunctiva/sclera: Conjunctivae normal.     Pupils: Pupils are equal, round, and  reactive to light.  Cardiovascular:     Rate and Rhythm: Normal rate and regular rhythm.     Heart sounds: No murmur heard. Pulmonary:     Effort: Pulmonary effort is normal. No respiratory distress.     Breath sounds: Normal breath sounds. No wheezing, rhonchi or rales.  Abdominal:     General: Abdomen is flat. Bowel sounds are normal.  Musculoskeletal:     Cervical back: Normal range of motion and neck supple.     Comments: Uses crutches due to L AKA.  Skin:    General: Skin is warm and dry.     Capillary Refill: Capillary refill takes less than 2 seconds.   Neurological:     General: No focal deficit present.     Mental Status: He is alert and oriented to person, place, and time.  Psychiatric:        Mood and Affect: Mood normal.        Behavior: Behavior normal.        Thought Content: Thought content normal.        Judgment: Judgment normal.    Results for orders placed or performed during the hospital encounter of 09/23/20  NM Myocar Multi W/Spect W/Wall Motion / EF  Result Value Ref Range   Rest HR 61 bpm   Rest BP 159/77 mmHg   Percent HR 67 %   Peak HR 97 bpm   Peak BP 165/68 mmHg   SSS 2    SRS 10    SDS 1    TID 0.89    LV sys vol 21 mL   LV dias vol 63 62 - 150 mL      Assessment & Plan:   Problem List Items Addressed This Visit       Cardiovascular and Mediastinum   Hypertension associated with diabetes ([place]) - Primary    Chronic.  Controlled.  Elevated at visit today.  Will reassess at next visit and adjust medications if elevated at that time.  Continue with current medication regimen.  Labs ordered today.  Return to clinic in 3 months for reevaluation.  Call sooner if concerns arise.        Relevant Orders   Comp Met (CMET)   CAD (coronary artery disease)    Chronic.  Controlled.  Continue with current medication regimen of Crestor 85m.  Labs ordered today.  Return to clinic in 6 months for reevaluation.  Call sooner if concerns arise.        Aortic atherosclerosis (HCC)    Chronic.  Under good control on current regimen. Continue current regimen of Crestor 470m Continue to monitor. Call with any concerns. Labs drawn today.        Endocrine   Hyperlipidemia associated with type 2 diabetes mellitus (HCC)    Chronic.  Controlled.  Continue with current medication regimen on Crestor 4022m Labs ordered today.  Return to clinic in 3 months for reevaluation.  Call sooner if concerns arise.        Relevant Orders   Lipid Profile   Diabetes mellitus associated with hormonal etiology ([place]  Chronic.   Last A1c was 8.1.  Having problems with diarrhea due to the Metformin.  Will check labs at visit today.  If A1c is less than 8 will stop the Metformin.  If A1c is greater than 8 will add Ozempic.  Continue with current medication regimen.  Labs ordered today.  Return to clinic in 3 months  for reevaluation.  Call sooner if concerns arise.        Relevant Orders   HgB A1c     Other   Vitamin D deficiency    Labs ordered today. Will make recommendations based on lab results.       Relevant Orders   Vitamin D (25 hydroxy)   B12 deficiency    Labs ordered today. Will make recommendations based on lab results.      Relevant Orders   B12     Follow up plan: Return in about 3 months (around 06/07/2021) for HTN, HLD, DM2 FU.

## 2021-03-08 NOTE — Assessment & Plan Note (Signed)
Labs ordered today.  Will make recommendations based on lab results. ?

## 2021-03-08 NOTE — Assessment & Plan Note (Signed)
Chronic.  Controlled.  Elevated at visit today.  Will reassess at next visit and adjust medications if elevated at that time.  Continue with current medication regimen.  Labs ordered today.  Return to clinic in 3 months for reevaluation.  Call sooner if concerns arise.

## 2021-03-09 LAB — COMPREHENSIVE METABOLIC PANEL
ALT: 25 IU/L (ref 0–44)
AST: 16 IU/L (ref 0–40)
Albumin/Globulin Ratio: 1.8 (ref 1.2–2.2)
Albumin: 4.2 g/dL (ref 3.7–4.7)
Alkaline Phosphatase: 84 IU/L (ref 44–121)
BUN/Creatinine Ratio: 18 (ref 10–24)
BUN: 18 mg/dL (ref 8–27)
Bilirubin Total: 0.4 mg/dL (ref 0.0–1.2)
CO2: 22 mmol/L (ref 20–29)
Calcium: 9.8 mg/dL (ref 8.6–10.2)
Chloride: 105 mmol/L (ref 96–106)
Creatinine, Ser: 1.02 mg/dL (ref 0.76–1.27)
Globulin, Total: 2.3 g/dL (ref 1.5–4.5)
Glucose: 124 mg/dL — ABNORMAL HIGH (ref 70–99)
Potassium: 4.5 mmol/L (ref 3.5–5.2)
Sodium: 146 mmol/L — ABNORMAL HIGH (ref 134–144)
Total Protein: 6.5 g/dL (ref 6.0–8.5)
eGFR: 76 mL/min/{1.73_m2} (ref >59)

## 2021-03-09 LAB — VITAMIN D 25 HYDROXY (VIT D DEFICIENCY, FRACTURES): Vit D, 25-Hydroxy: 33.6 ng/mL (ref 30.0–100.0)

## 2021-03-09 LAB — LIPID PANEL
Chol/HDL Ratio: 3.6 ratio (ref 0.0–5.0)
Cholesterol, Total: 128 mg/dL (ref 100–199)
HDL: 36 mg/dL — ABNORMAL LOW (ref >39)
LDL Chol Calc (NIH): 72 mg/dL (ref 0–99)
Triglycerides: 106 mg/dL (ref 0–149)
VLDL Cholesterol Cal: 20 mg/dL (ref 5–40)

## 2021-03-09 LAB — HEMOGLOBIN A1C
Est. average glucose Bld gHb Est-mCnc: 140 mg/dL
Hgb A1c MFr Bld: 6.5 % — ABNORMAL HIGH (ref 4.8–5.6)

## 2021-03-09 LAB — VITAMIN B12: Vitamin B-12: 1622 pg/mL — ABNORMAL HIGH (ref 232–1245)

## 2021-03-09 NOTE — Progress Notes (Signed)
Please let patient know that his A1c is 6.5.  At this time, I would like him to stop the Metformin and return in 3 months to have his A1c rechecked.  If we need to adjust medications we can do it at that time.  Other blood work looks good.  We will continue to monitor in the future.  Please let me know if he has any questions.

## 2021-03-21 ENCOUNTER — Other Ambulatory Visit: Payer: Self-pay | Admitting: Internal Medicine

## 2021-03-21 ENCOUNTER — Other Ambulatory Visit: Payer: Self-pay | Admitting: Nurse Practitioner

## 2021-03-21 ENCOUNTER — Other Ambulatory Visit: Payer: Self-pay | Admitting: Family Medicine

## 2021-03-21 DIAGNOSIS — E1169 Type 2 diabetes mellitus with other specified complication: Secondary | ICD-10-CM

## 2021-03-21 NOTE — Telephone Encounter (Signed)
Requested Prescriptions  Pending Prescriptions Disp Refills  . glipiZIDE (GLUCOTROL) 5 MG tablet [Pharmacy Med Name: glipiZIDE 5 MG Oral Tablet] 90 tablet 3    Sig: TAKE 1 TABLET BY MOUTH  DAILY BEFORE BREAKFAST     Endocrinology:  Diabetes - Sulfonylureas Passed - 03/21/2021  5:13 AM      Passed - HBA1C is between 0 and 7.9 and within 180 days    HB A1C (BAYER DCA - WAIVED)  Date Value Ref Range Status  07/28/2020 7.5 (H) <7.0 % Final    Comment:                                          Diabetic Adult            <7.0                                       Healthy Adult        4.3 - 5.7                                                           (DCCT/NGSP) American Diabetes Association's Summary of Glycemic Recommendations for Adults with Diabetes: Hemoglobin A1c <7.0%. More stringent glycemic goals (A1c <6.0%) may further reduce complications at the cost of increased risk of hypoglycemia.    Hgb A1c MFr Bld  Date Value Ref Range Status  03/08/2021 6.5 (H) 4.8 - 5.6 % Final    Comment:             Prediabetes: 5.7 - 6.4          Diabetes: >6.4          Glycemic control for adults with diabetes: <7.0          Passed - Valid encounter within last 6 months    Recent Outpatient Visits          1 week ago Hypertension associated with diabetes (HCC)   [facility] Larae Grooms, NP   4 months ago Diabetes mellitus associated with hormonal etiology (HCC)   [facility] McElwee, Lauren A, NP   6 months ago Pre-op evaluation   [facility] Larae Grooms, NP   7 months ago Routine general medical examination at a health care facility   [facility], [place] P, DO   1 year ago Diabetes mellitus associated with hormonal etiology [facility])   [facility] Dorcas Carrow, DO      Future Appointments            In 2 months Larae Grooms, NP [facility], [facility]   In 4 months  [facility],  [facility]

## 2021-03-21 NOTE — Telephone Encounter (Signed)
Requested Prescriptions  Pending Prescriptions Disp Refills  . tamsulosin (FLOMAX) 0.4 MG CAPS capsule [Pharmacy Med Name: Tamsulosin HCl 0.4 MG Oral Capsule] 90 capsule 2    Sig: TAKE 1 CAPSULE BY MOUTH  DAILY     Urology: Alpha-Adrenergic Blocker Failed - 03/21/2021  5:13 AM      Failed - Last BP in normal range    BP Readings from Last 1 Encounters:  03/08/21 (!) 150/70         Passed - Valid encounter within last 12 months    Recent Outpatient Visits          1 week ago Hypertension associated with diabetes (HCC)   [facility] Larae Grooms, NP   4 months ago Diabetes mellitus associated with hormonal etiology (HCC)   [facility] McElwee, Lauren A, NP   6 months ago Pre-op evaluation   [facility] Larae Grooms, NP   7 months ago Routine general medical examination at a health care facility   [facility], [place] P, DO   1 year ago Diabetes mellitus associated with hormonal etiology [facility])   [facility] Dorcas Carrow, DO      Future Appointments            In 2 months Larae Grooms, NP [facility], [facility]   In 4 months  [facility], [facility]           . amLODipine (NORVASC) 10 MG tablet [Pharmacy Med Name: amLODIPine Besylate 10 MG Oral Tablet] 90 tablet 2    Sig: TAKE 1 TABLET BY MOUTH  DAILY     Cardiovascular:  Calcium Channel Blockers Failed - 03/21/2021  5:13 AM      Failed - Last BP in normal range    BP Readings from Last 1 Encounters:  03/08/21 (!) 150/70         Passed - Valid encounter within last 6 months    Recent Outpatient Visits          1 week ago Hypertension associated with diabetes (HCC)   [facility] Larae Grooms, NP   4 months ago Diabetes mellitus associated with hormonal etiology (HCC)   [facility] McElwee, Lauren A, NP   6 months ago Pre-op evaluation   [facility] Larae Grooms, NP    7 months ago Routine general medical examination at a health care facility   [facility], [place] P, DO   1 year ago Diabetes mellitus associated with hormonal etiology [facility])   [facility] Dorcas Carrow, DO      Future Appointments            In 2 months Larae Grooms, NP [facility], [facility]   In 4 months  [facility], [facility]           . rosuvastatin (CRESTOR) 40 MG tablet [Pharmacy Med Name: Rosuvastatin Calcium 40 MG Oral Tablet] 90 tablet 2    Sig: TAKE 1 TABLET BY MOUTH  DAILY     Cardiovascular:  Antilipid - Statins Failed - 03/21/2021  5:13 AM      Failed - HDL in normal range and within 360 days    HDL  Date Value Ref Range Status  03/08/2021 36 (L) >39 mg/dL Final         Passed - Total Cholesterol in normal range and within 360 days    Cholesterol, Total  Date Value Ref  Range Status  03/08/2021 128 100 - 199 mg/dL Final   Cholesterol Piccolo, Waived  Date Value Ref Range Status  03/28/2018 122 <200 mg/dL Final    Comment:                            Desirable                <200                         Borderline High      200- 239                         High                     >239          Passed - LDL in normal range and within 360 days    LDL Chol Calc (NIH)  Date Value Ref Range Status  03/08/2021 72 0 - 99 mg/dL Final         Passed - Triglycerides in normal range and within 360 days    Triglycerides  Date Value Ref Range Status  03/08/2021 106 0 - 149 mg/dL Final   Triglycerides Piccolo,Waived  Date Value Ref Range Status  03/28/2018 232 (H) <150 mg/dL Final    Comment:                            Normal                   <150                         Borderline High     150 - 199                         High                200 - 499                         Very High                >499          Passed - Patient is not pregnant      Passed - Valid encounter within last 12 months     Recent Outpatient Visits          1 week ago Hypertension associated with diabetes (HCC)   [facility] Larae Grooms, NP   4 months ago Diabetes mellitus associated with hormonal etiology (HCC)   [facility] McElwee, Lauren A, NP   6 months ago Pre-op evaluation   [facility] Larae Grooms, NP   7 months ago Routine general medical examination at a health care facility   [facility], [place] P, DO   1 year ago Diabetes mellitus associated with hormonal etiology [facility])   [facility] Dorcas Carrow, DO      Future Appointments            In 2 months Larae Grooms, NP [facility]   In 4 months  [facility], [facility]           .  meloxicam (MOBIC) 15 MG tablet [Pharmacy Med Name: Meloxicam 15 MG Oral Tablet] 90 tablet 2    Sig: TAKE 1 TABLET BY MOUTH  DAILY     Analgesics:  COX2 Inhibitors Passed - 03/21/2021  5:13 AM      Passed - HGB in normal range and within 360 days    Hemoglobin  Date Value Ref Range Status  09/09/2020 15.6 13.0 - 17.7 g/dL Final         Passed - Cr in normal range and within 360 days    Creatinine, Ser  Date Value Ref Range Status  03/08/2021 1.02 0.76 - 1.27 mg/dL Final   Creatinine, Urine  Date Value Ref Range Status  03/05/2019 82 mg/dL Final    Comment:    Performed at [facility], [address], [place], [place] [address]         Passed - Patient is not pregnant      Passed - Valid encounter within last 12 months    Recent Outpatient Visits          1 week ago Hypertension associated with diabetes (HCC)   [facility] Larae Grooms, NP   4 months ago Diabetes mellitus associated with hormonal etiology (HCC)   [facility] McElwee, Lauren A, NP   6 months ago Pre-op evaluation   [facility] Larae Grooms, NP   7 months ago Routine general medical examination at a health  care facility   [facility], [place] P, DO   1 year ago Diabetes mellitus associated with hormonal etiology [facility])   [facility] Dorcas Carrow, DO      Future Appointments            In 2 months Larae Grooms, NP [facility]   In 4 months  [facility], [facility]

## 2021-03-21 NOTE — Telephone Encounter (Signed)
Requested Prescriptions  Pending Prescriptions Disp Refills  . allopurinol (ZYLOPRIM) 300 MG tablet [Pharmacy Med Name: Allopurinol 300 MG Oral Tablet] 90 tablet 2    Sig: TAKE 1 TABLET BY MOUTH  DAILY     Endocrinology:  Gout Agents Failed - 03/21/2021  5:13 AM      Failed - Uric Acid in normal range and within 360 days    Uric Acid  Date Value Ref Range Status  07/28/2020 2.4 (L) 3.8 - 8.4 mg/dL Final    Comment:               Therapeutic target for gout patients: <6.0         Passed - Cr in normal range and within 360 days    Creatinine, Ser  Date Value Ref Range Status  03/08/2021 1.02 0.76 - 1.27 mg/dL Final   Creatinine, Urine  Date Value Ref Range Status  03/05/2019 82 mg/dL Final    Comment:    Performed at [facility], [address] [place], [place] [address]         Passed - Valid encounter within last 12 months    Recent Outpatient Visits          1 week ago Hypertension associated with diabetes (HCC)   [facility] Larae Grooms, NP   4 months ago Diabetes mellitus associated with hormonal etiology (HCC)   [facility] McElwee, Lauren A, NP   6 months ago Pre-op evaluation   [facility] Larae Grooms, NP   7 months ago Routine general medical examination at a health care facility   [facility], [place] P, DO   1 year ago Diabetes mellitus associated with hormonal etiology (HCC)   [facility] Dorcas Carrow, DO      Future Appointments            In 2 months Larae Grooms, NP [facility], [facility]   In 4 months  [facility], [facility]           . losartan (COZAAR) 100 MG tablet [Pharmacy Med Name: Losartan Potassium 100 MG Oral Tablet] 90 tablet 2    Sig: TAKE 1 TABLET BY MOUTH  DAILY     Cardiovascular:  Angiotensin Receptor Blockers Failed - 03/21/2021  5:13 AM      Failed - Last BP in normal range    BP Readings from Last 1 Encounters:   03/08/21 (!) 150/70         Passed - Cr in normal range and within 180 days    Creatinine, Ser  Date Value Ref Range Status  03/08/2021 1.02 0.76 - 1.27 mg/dL Final   Creatinine, Urine  Date Value Ref Range Status  03/05/2019 82 mg/dL Final    Comment:    Performed at [facility], [address] [place], [place] [address]         Passed - K in normal range and within 180 days    Potassium  Date Value Ref Range Status  03/08/2021 4.5 3.5 - 5.2 mmol/L Final         Passed - Patient is not pregnant      Passed - Valid encounter within last 6 months    Recent Outpatient Visits          1 week ago Hypertension associated with diabetes [facility])   [facility] Larae Grooms, NP   4 months ago Diabetes mellitus associated with  hormonal etiology (HCC)   [facility] McElwee, Lauren A, NP   6 months ago Pre-op evaluation   [facility] Larae Grooms, NP   7 months ago Routine general medical examination at a health care facility   [facility], [place] P, DO   1 year ago Diabetes mellitus associated with hormonal etiology [facility])   [facility] Dorcas Carrow, DO      Future Appointments            In 2 months Larae Grooms, NP [facility], [facility]   In 4 months  [facility], [facility]

## 2021-03-26 DIAGNOSIS — E119 Type 2 diabetes mellitus without complications: Secondary | ICD-10-CM | POA: Diagnosis not present

## 2021-04-28 ENCOUNTER — Other Ambulatory Visit: Payer: Self-pay | Admitting: Family Medicine

## 2021-04-28 ENCOUNTER — Other Ambulatory Visit: Payer: Self-pay | Admitting: Nurse Practitioner

## 2021-04-28 ENCOUNTER — Telehealth: Payer: Self-pay | Admitting: Family Medicine

## 2021-04-28 DIAGNOSIS — E1169 Type 2 diabetes mellitus with other specified complication: Secondary | ICD-10-CM

## 2021-04-28 NOTE — Telephone Encounter (Signed)
Requested Prescriptions  Pending Prescriptions Disp Refills   Vitamin D, Ergocalciferol, (DRISDOL) 1.25 MG (50000 UNIT) CAPS capsule [Pharmacy Med Name: VITAMIN D 1.25 MG (50000 UT) Capsule] 12 capsule 1    Sig: TAKE 1 CAPSULE EVERY 7 DAYS     Endocrinology:  Vitamins - Vitamin D Supplementation Failed - 04/28/2021  3:24 PM      Failed - 50,000 IU strengths are not delegated      Failed - Phosphate in normal range and within 360 days    Phosphorus  Date Value Ref Range Status  03/11/2019 2.7 2.5 - 4.6 mg/dL Final         Passed - Ca in normal range and within 360 days    Calcium  Date Value Ref Range Status  03/08/2021 9.8 8.6 - 10.2 mg/dL Final         Passed - Vitamin D in normal range and within 360 days    Vit D, 25-Hydroxy  Date Value Ref Range Status  03/08/2021 33.6 30.0 - 100.0 ng/mL Final    Comment:    Vitamin D deficiency has been defined by the Institute of Medicine and an Endocrine Society practice guideline as a level of serum 25-OH vitamin D less than 20 ng/mL (1,2). The Endocrine Society went on to further define vitamin D insufficiency as a level between 21 and 29 ng/mL (2). 1. IOM (Institute of Medicine). 2010. Dietary reference    intakes for calcium and D. [place]: The    [employer]. 2. Holick MF, Binkley Coleman, Bischoff-Ferrari HA, et al.    Evaluation, treatment, and prevention of vitamin D    deficiency: an Endocrine Society clinical practice    guideline. JCEM. 2011 Jul; 96(7):1911-30.          Passed - Valid encounter within last 12 months    Recent Outpatient Visits          1 month ago Hypertension associated with diabetes (HCC)   [facility] Larae Grooms, NP   5 months ago Diabetes mellitus associated with hormonal etiology (HCC)   [facility] McElwee, Lauren A, NP   7 months ago Pre-op evaluation   [facility] Larae Grooms, NP   9 months ago Routine general medical  examination at a health care facility   [facility], [place] P, DO   1 year ago Diabetes mellitus associated with hormonal etiology [facility])   [facility] Dorcas Carrow, DO      Future Appointments            In 1 month Larae Grooms, NP [facility], [facility]   In 3 months  [facility], [facility]            pantoprazole (PROTONIX) 40 MG tablet [Pharmacy Med Name: PANTOPRAZOLE SODIUM 40 MG Tablet Delayed Release] 180 tablet 3    Sig: TAKE 1 TABLET 2 TIMES DAILY BEFORE A MEAL.     Gastroenterology: Proton Pump Inhibitors Passed - 04/28/2021  3:24 PM      Passed - Valid encounter within last 12 months    Recent Outpatient Visits          1 month ago Hypertension associated with diabetes [facility])   [facility] Larae Grooms, NP   5 months ago Diabetes mellitus associated with hormonal etiology (HCC)   [facility] McElwee, Jake Church, NP   7 months ago Pre-op evaluation   [facility] [place],  NP   9 months ago Routine general medical examination at a health care facility   [facility], [place] P, DO   1 year ago Diabetes mellitus associated with hormonal etiology [facility])   [facility] Dorcas Carrow, DO      Future Appointments            In 1 month Larae Grooms, NP [facility], [facility]   In 3 months  [facility], [facility]

## 2021-04-28 NOTE — Telephone Encounter (Signed)
Requested Prescriptions  Pending Prescriptions Disp Refills   tamsulosin (FLOMAX) 0.4 MG CAPS capsule [Pharmacy Med Name: TAMSULOSIN HYDROCHLORIDE 0.4 MG Capsule] 90 capsule 2    Sig: TAKE 1 CAPSULE EVERY DAY     Urology: Alpha-Adrenergic Blocker Failed - 04/28/2021  3:24 PM      Failed - Last BP in normal range    BP Readings from Last 1 Encounters:  03/08/21 (!) 150/70         Passed - Valid encounter within last 12 months    Recent Outpatient Visits          1 month ago Hypertension associated with diabetes (HCC)   [facility] [place] [facility] [place], Clydie Braun, NP   5 months ago Diabetes mellitus associated with hormonal etiology (HCC)   [facility] McElwee, Lauren A, NP   7 months ago Pre-op evaluation   [facility] Larae Grooms, NP   9 months ago Routine general medical examination at a health care facility   [facility], [place] P, DO   1 year ago Diabetes mellitus associated with hormonal etiology [facility])   [facility] Dorcas Carrow, DO      Future Appointments            In 1 month Larae Grooms, NP [facility], [facility]   In 3 months  [facility], [facility]            vitamin B-12 (CYANOCOBALAMIN) 1000 MCG tablet [Pharmacy Med Name: VITAMIN B-12 1000 MCG Tablet] 90 tablet 4    Sig: TAKE 1 TABLET (1,000 MCG TOTAL) BY MOUTH DAILY.     Off-Protocol Failed - 04/28/2021  3:24 PM      Failed - Medication not assigned to a protocol, review manually.      Passed - Valid encounter within last 12 months    Recent Outpatient Visits          1 month ago Hypertension associated with diabetes (HCC)   [facility] Larae Grooms, NP   5 months ago Diabetes mellitus associated with hormonal etiology (HCC)   [facility] McElwee, Lauren A, NP   7 months ago Pre-op evaluation   [facility] Larae Grooms, NP   9 months ago Routine general medical  examination at a health care facility   [facility], [place] P, DO   1 year ago Diabetes mellitus associated with hormonal etiology [facility])   [facility] Dorcas Carrow, DO      Future Appointments            In 1 month Larae Grooms, NP [facility], [facility]   In 3 months  [facility], [facility]

## 2021-04-28 NOTE — Telephone Encounter (Signed)
Requested Prescriptions  Pending Prescriptions Disp Refills   rosuvastatin (CRESTOR) 40 MG tablet [Pharmacy Med Name: ROSUVASTATIN CALCIUM 40 MG Tablet] 90 tablet 2    Sig: TAKE 1 TABLET (40 MG TOTAL) BY MOUTH DAILY.     Cardiovascular:  Antilipid - Statins Failed - 04/28/2021  3:25 PM      Failed - HDL in normal range and within 360 days    HDL  Date Value Ref Range Status  03/08/2021 36 (L) >39 mg/dL Final         Passed - Total Cholesterol in normal range and within 360 days    Cholesterol, Total  Date Value Ref Range Status  03/08/2021 128 100 - 199 mg/dL Final   Cholesterol Piccolo, Waived  Date Value Ref Range Status  03/28/2018 122 <200 mg/dL Final    Comment:                            Desirable                <200                         Borderline High      200- 239                         High                     >239          Passed - LDL in normal range and within 360 days    LDL Chol Calc (NIH)  Date Value Ref Range Status  03/08/2021 72 0 - 99 mg/dL Final         Passed - Triglycerides in normal range and within 360 days    Triglycerides  Date Value Ref Range Status  03/08/2021 106 0 - 149 mg/dL Final   Triglycerides Piccolo,Waived  Date Value Ref Range Status  03/28/2018 232 (H) <150 mg/dL Final    Comment:                            Normal                   <150                         Borderline High     150 - 199                         High                200 - 499                         Very High                >499          Passed - Patient is not pregnant      Passed - Valid encounter within last 12 months    Recent Outpatient Visits          1 month ago Hypertension associated with diabetes ([place])   [facility] Jon Billings, NP   5 months ago Diabetes mellitus associated with hormonal etiology ([place])  [place], Scheryl Darter, NP   7 months ago Pre-op evaluation   [place], NP   9 months ago Routine general medical examination at a health care facility   [facility], [place], DO   1 year ago Diabetes mellitus associated with hormonal etiology [facility])   [place], [place], DO      Future Appointments            In 1 month Jon Billings, NP [place], [facility]   In 3 months  [place], [facility]            metFORMIN (GLUCOPHAGE) 500 MG tablet [Pharmacy Med Name: METFORMIN HYDROCHLORIDE 500 MG Tablet] 360 tablet 1    Sig: TAKE 2 TABLETS (1,000 MG TOTAL) BY MOUTH 2 (TWO) TIMES DAILY WITH A MEAL.     Endocrinology:  Diabetes - Biguanides Passed - 04/28/2021  3:25 PM      Passed - Cr in normal range and within 360 days    Creatinine, Ser  Date Value Ref Range Status  03/08/2021 1.02 0.76 - 1.27 mg/dL Final   Creatinine, Urine  Date Value Ref Range Status  03/05/2019 82 mg/dL Final    Comment:    Performed at [facility], [address] [place], [place] [address]         Passed - HBA1C is between 0 and 7.9 and within 180 days    HB A1C (BAYER DCA - WAIVED)  Date Value Ref Range Status  07/28/2020 7.5 (H) <7.0 % Final    Comment:                                          Diabetic Adult            <7.0                                       Healthy Adult        4.3 - 5.7                                                           (DCCT/NGSP) American Diabetes Association's Summary of Glycemic Recommendations for Adults with Diabetes: Hemoglobin A1c <7.0%. More stringent glycemic goals (A1c <6.0%) may further reduce complications at the cost of increased risk of hypoglycemia.    Hgb A1c MFr Bld  Date Value Ref Range Status  03/08/2021 6.5 (H) 4.8 - 5.6 % Final    Comment:             Prediabetes: 5.7 - 6.4          Diabetes: >6.4          Glycemic control for adults with diabetes: <7.0          Passed - eGFR in normal range and within 360 days     GFR calc Af Amer  Date Value Ref Range Status  01/15/2020 89 >59 mL/min/1.73 Final    Comment:    **[facility] currently reports eGFR in compliance with the current**  recommendations of the [employer]. [facility] will   update reporting as new guidelines are published from the NKF-ASN   Task force.    GFR calc non Af Amer  Date Value Ref Range Status  01/15/2020 77 >59 mL/min/1.73 Final   eGFR  Date Value Ref Range Status  03/08/2021 76 >59 mL/min/1.73 Final         Passed - Valid encounter within last 6 months    Recent Outpatient Visits          1 month ago Hypertension associated with diabetes ([place])   [facility] Jon Billings, NP   5 months ago Diabetes mellitus associated with hormonal etiology ([place])   [place], Lauren A, NP   7 months ago Pre-op evaluation   [facility] Jon Billings, NP   9 months ago Routine general medical examination at a health care facility   [facility], [place], DO   1 year ago Diabetes mellitus associated with hormonal etiology ([place])   [place], [place], DO      Future Appointments            In 1 month Jon Billings, NP [place], [place]   In 3 months  [place], [facility]            allopurinol (ZYLOPRIM) 300 MG tablet [Pharmacy Med Name: ALLOPURINOL 300 MG Tablet] 90 tablet 2    Sig: TAKE Novelty     Endocrinology:  Gout Agents Failed - 04/28/2021  3:25 PM      Failed - Uric Acid in normal range and within 360 days    Uric Acid  Date Value Ref Range Status  07/28/2020 2.4 (L) 3.8 - 8.4 mg/dL Final    Comment:               Therapeutic target for gout patients: <6.0         Passed - Cr in normal range and within 360 days    Creatinine, Ser  Date Value Ref Range Status  03/08/2021 1.02 0.76 - 1.27 mg/dL Final   Creatinine, Urine  Date Value Ref Range Status  03/05/2019 82  mg/dL Final    Comment:    Performed at [facility], [address] [place], [place] [address]         [place] - Valid encounter within last 12 months    Recent Outpatient Visits          1 month ago Hypertension associated with diabetes ([place])   [facility] Jon Billings, NP   5 months ago Diabetes mellitus associated with hormonal etiology ([place])   [place], Lauren A, NP   7 months ago Pre-op evaluation   [facility] Jon Billings, NP   9 months ago Routine general medical examination at a health care facility   [facility], [place], DO   1 year ago Diabetes mellitus associated with hormonal etiology [facility])   [place], [place], DO      Future Appointments            In 1 month Jon Billings, NP [place], [facility]   In 3 months  [place], [facility]            losartan (COZAAR) 100 MG tablet [Pharmacy Med Name: LOSARTAN POTASSIUM 100 MG Tablet] 90 tablet 2  Sig: TAKE 1 TABLET EVERY DAY     Cardiovascular:  Angiotensin Receptor Blockers Failed - 04/28/2021  3:25 PM      Failed - Last BP in normal range    BP Readings from Last 1 Encounters:  03/08/21 (!) 150/70         Passed - Cr in normal range and within 180 days    Creatinine, Ser  Date Value Ref Range Status  03/08/2021 1.02 0.76 - 1.27 mg/dL Final   Creatinine, Urine  Date Value Ref Range Status  03/05/2019 82 mg/dL Final    Comment:    Performed at [facility], [address], [place], [place] [address]         Passed - K in normal range and within 180 days    Potassium  Date Value Ref Range Status  03/08/2021 4.5 3.5 - 5.2 mmol/L Final         Passed - Patient is not pregnant      Passed - Valid encounter within last 6 months    Recent Outpatient Visits          1 month ago Hypertension associated with diabetes ([place])   [facility]  Jon Billings, NP   5 months ago Diabetes mellitus associated with hormonal etiology ([place])   [place], Lauren A, NP   7 months ago Pre-op evaluation   [facility] Jon Billings, NP   9 months ago Routine general medical examination at a health care facility   [facility], [place] P, DO   1 year ago Diabetes mellitus associated with hormonal etiology ([place])   [place], [place], DO      Future Appointments            In 1 month Jon Billings, NP [place], [place]   In 3 months  [place], [facility]            meloxicam ([place]ebo) 15 MG tablet [Pharmacy Med Name: MELOXICAM 15 MG Tablet] 90 tablet 2    Sig: TAKE 1 TABLET EVERY DAY     Analgesics:  COX2 Inhibitors Passed - 04/28/2021  3:25 PM      Passed - HGB in normal range and within 360 days    Hemoglobin  Date Value Ref Range Status  09/09/2020 15.6 13.0 - 17.7 g/dL Final         Passed - Cr in normal range and within 360 days    Creatinine, Ser  Date Value Ref Range Status  03/08/2021 1.02 0.76 - 1.27 mg/dL Final   Creatinine, Urine  Date Value Ref Range Status  03/05/2019 82 mg/dL Final    Comment:    Performed at [facility], [address], [place], [place] [address]         [place] - Patient is not pregnant      Passed - Valid encounter within last 12 months    Recent Outpatient Visits          1 month ago Hypertension associated with diabetes ([place])   [facility] Jon Billings, NP   5 months ago Diabetes mellitus associated with hormonal etiology ([place])   [place], Lauren A, NP   7 months ago Pre-op evaluation   [place], NP   9 months ago Routine general medical examination at a health care facility   [facility], [place], DO  1 year ago Diabetes mellitus associated with hormonal etiology [facility])    [place], [place], DO      Future Appointments            In 1 month Jon Billings, NP [place], [facility]   In 3 months  [place], [facility]            glipiZIDE (GLUCOTROL) 5 MG tablet [employer] Med Name: GLIPIZIDE 5 MG Tablet] 90 tablet 0    Sig: TAKE 1 TABLET (5 MG TOTAL) BY MOUTH DAILY BEFORE BREAKFAST.     Endocrinology:  Diabetes - Sulfonylureas Passed - 04/28/2021  3:25 PM      Passed - HBA1C is between 0 and 7.9 and within 180 days    HB A1C (BAYER DCA - WAIVED)  Date Value Ref Range Status  07/28/2020 7.5 (H) <7.0 % Final    Comment:                                          Diabetic Adult            <7.0                                       Healthy Adult        4.3 - 5.7                                                           (DCCT/NGSP) American Diabetes Association's Summary of Glycemic Recommendations for Adults with Diabetes: Hemoglobin A1c <7.0%. More stringent glycemic goals (A1c <6.0%) may further reduce complications at the cost of increased risk of hypoglycemia.    Hgb A1c MFr Bld  Date Value Ref Range Status  03/08/2021 6.5 (H) 4.8 - 5.6 % Final    Comment:             Prediabetes: 5.7 - 6.4          Diabetes: >6.4          Glycemic control for adults with diabetes: <7.0          Passed - Valid encounter within last 6 months    Recent Outpatient Visits          1 month ago Hypertension associated with diabetes ([place])   [facility] Jon Billings, NP   5 months ago Diabetes mellitus associated with hormonal etiology ([place])   [place], Lauren A, NP   7 months ago Pre-op evaluation   [facility] Jon Billings, NP   9 months ago Routine general medical examination at a health care facility   [facility], [place] P, DO   1 year ago Diabetes mellitus associated with hormonal etiology [facility])   [place],  [place], DO      Future Appointments            In 1 month Jon Billings, NP [place], [place]   In 3 months  [employer], [place]  amLODipine (NORVASC) 10 MG tablet [Pharmacy Med Name: AMLODIPINE BESYLATE 10 MG Tablet] 90 tablet 2    Sig: TAKE 1 TABLET EVERY DAY     Cardiovascular:  Calcium Channel Blockers Failed - 04/28/2021  3:25 PM      Failed - Last BP in normal range    BP Readings from Last 1 Encounters:  03/08/21 (!) 150/70         Passed - Valid encounter within last 6 months    Recent Outpatient Visits          1 month ago Hypertension associated with diabetes ([place])   [facility] Jon Billings, NP   5 months ago Diabetes mellitus associated with hormonal etiology ([place])   [place], Lauren A, NP   7 months ago Pre-op evaluation   [facility] Jon Billings, NP   9 months ago Routine general medical examination at a health care facility   [facility], [place] P, DO   1 year ago Diabetes mellitus associated with hormonal etiology [facility])   [place], [place], DO      Future Appointments            In 1 month Jon Billings, NP [place], [place]   In 3 months  [employer], [place]

## 2021-05-09 NOTE — Telephone Encounter (Signed)
Pt's wife called and is requesting to speak to a nurse about her missing medication.  Best contact: [phone]

## 2021-05-09 NOTE — Telephone Encounter (Signed)
Tried to call pt to get more clarification. It looks like pt has a refill already on this medication.

## 2021-05-09 NOTE — Telephone Encounter (Signed)
*  His missing medication

## 2021-05-24 ENCOUNTER — Other Ambulatory Visit: Payer: Self-pay | Admitting: Family Medicine

## 2021-05-24 ENCOUNTER — Other Ambulatory Visit: Payer: Self-pay | Admitting: Nurse Practitioner

## 2021-05-24 DIAGNOSIS — E1169 Type 2 diabetes mellitus with other specified complication: Secondary | ICD-10-CM

## 2021-05-25 NOTE — Telephone Encounter (Signed)
Flomax refilled 03/21/2021 #90 with 2 refills. Should last until 12/28/21. Vit B refilled 07/30/2020 #90 with 4 refills. Should last until 07/30/2021. Requested Prescriptions  Pending Prescriptions Disp Refills   tamsulosin (FLOMAX) 0.4 MG CAPS capsule [Pharmacy Med Name: TAMSULOSIN HYDROCHLORIDE 0.4 MG Capsule] 90 capsule 2    Sig: TAKE 1 CAPSULE EVERY DAY     Urology: Alpha-Adrenergic Blocker Failed - 05/24/2021 12:21 PM      Failed - Last BP in normal range    BP Readings from Last 1 Encounters:  03/08/21 (!) 150/70         Passed - PSA in normal range and within 360 days    Prostate Specific Ag, Serum  Date Value Ref Range Status  07/28/2020 2.4 0.0 - 4.0 ng/mL Final    Comment:    Roche ECLIA methodology. According to the American Urological Association, Serum PSA should decrease and remain at undetectable levels after radical prostatectomy. The AUA defines biochemical recurrence as an initial PSA value 0.2 ng/mL or greater followed by a subsequent confirmatory PSA value 0.2 ng/mL or greater. Values obtained with different assay methods or kits cannot be used interchangeably. Results cannot be interpreted as absolute evidence of the presence or absence of malignant disease.          Passed - Valid encounter within last 12 months    Recent Outpatient Visits          2 months ago Hypertension associated with diabetes (HCC)   [facility] Larae Grooms, NP   6 months ago Diabetes mellitus associated with hormonal etiology (HCC)   [facility] Mc[place]wee, Lauren A, NP   8 months ago Pre-op evaluation   [facility] Larae Grooms, NP   10 months ago Routine general medical examination at a health care facility   [facility], [place] P, DO   1 year ago Diabetes mellitus associated with hormonal etiology [facility])   [facility] Dorcas Carrow, DO      Future Appointments            In 2 weeks Larae Grooms, NP [facility], [facility]   In 2 months  [facility], [facility]            vitamin B-12 (CYANOCOBALAMIN) 1000 MCG tablet [Pharmacy Med Name: VITAMIN B-12 1000 MCG Tablet] 90 tablet 4    Sig: TAKE 1 TABLET (1,000 MCG TOTAL) BY MOUTH DAILY.     Endocrinology:  Vitamins - Vitamin B12 Failed - 05/24/2021 12:21 PM      Failed - B12 Level in normal range and within 360 days    Vitamin B-12  Date Value Ref Range Status  03/08/2021 1,622 (H) 232 - 1,245 pg/mL Final         Passed - HCT in normal range and within 360 days    Hematocrit  Date Value Ref Range Status  09/09/2020 44.8 37.5 - 51.0 % Final         Passed - HGB in normal range and within 360 days    Hemoglobin  Date Value Ref Range Status  09/09/2020 15.6 13.0 - 17.7 g/dL Final         Passed - Valid encounter within last 12 months    Recent Outpatient Visits          2 months ago Hypertension associated with diabetes [facility])   [facility] Larae Grooms, NP   6 months ago Diabetes mellitus associated  with hormonal etiology ([place])   [place], Lauren A, NP   8 months ago Pre-op evaluation   [facility] Jon Billings, NP   10 months ago Routine general medical examination at a health care facility   [facility], [place] P, DO   1 year ago Diabetes mellitus associated with hormonal etiology [facility])   [place], [place], DO      Future Appointments            In 2 weeks Jon Billings, NP [place], [place]   In 2 months  [facility], [place]

## 2021-05-25 NOTE — Telephone Encounter (Signed)
Rosuvastatin  Refilled 03/21/2021 #90 2 refills - enough to last until 12/2021 Losartan refilled 03/21/2021 #90 2 refills  - enough to last until 12/2021 Allopurinol refilled 03/21/2021 #90 2 refills  - enough to last until 12/2021 Glipizide refilled 03/21/2021 #90 0 refills - enough to last until 06/19/2021 Meloxicam refilled 03/21/2021 #90 with 2 refills- enough to last until 12/2021 Amlodipine refilled 03/21/2021 #90 2 refills - enough to last until 12/2021 Requested Prescriptions  Pending Prescriptions Disp Refills   rosuvastatin (CRESTOR) 40 MG tablet [Pharmacy Med Name: ROSUVASTATIN CALCIUM 40 MG Tablet] 90 tablet 2    Sig: TAKE 1 TABLET (40 MG TOTAL) BY MOUTH DAILY.     Cardiovascular:  Antilipid - Statins 2 Failed - 05/24/2021 12:21 PM      Failed - Lipid Panel in normal range within the last 12 months    Cholesterol, Total  Date Value Ref Range Status  03/08/2021 128 100 - 199 mg/dL Final   Cholesterol Piccolo, Waived  Date Value Ref Range Status  03/28/2018 122 <200 mg/dL Final    Comment:                            Desirable                <200                         Borderline High      200- 239                         High                     >239    LDL Chol Calc (NIH)  Date Value Ref Range Status  03/08/2021 72 0 - 99 mg/dL Final   HDL  Date Value Ref Range Status  03/08/2021 36 (L) >39 mg/dL Final   Triglycerides  Date Value Ref Range Status  03/08/2021 106 0 - 149 mg/dL Final   Triglycerides Piccolo,Waived  Date Value Ref Range Status  03/28/2018 232 (H) <150 mg/dL Final    Comment:                            Normal                   <150                         Borderline High     150 - 199                         High                200 - 499                         Very High                >499          Passed - Cr in normal range and within 360 days    Creatinine, Ser  Date Value Ref Range Status  03/08/2021 1.02 0.76 - 1.27 mg/dL Final    Creatinine, Urine  Date Value Ref Range Status  03/05/2019 82  mg/dL Final    Comment:    Performed at [facility], [address] [place], [place] [address]         Passed - Patient is not pregnant      Passed - Valid encounter within last 12 months    Recent Outpatient Visits          2 months ago Hypertension associated with diabetes [facility])   [facility] Jon Billings, NP   6 months ago Diabetes mellitus associated with hormonal etiology ([place])   [place], Lauren A, NP   8 months ago Pre-op evaluation   [facility] Jon Billings, NP   10 months ago Routine general medical examination at a health care facility   [facility], [place] P, DO   1 year ago Diabetes mellitus associated with hormonal etiology [facility])   [place], [place], DO      Future Appointments            In 2 weeks Jon Billings, NP [place], [facility]   In 2 months  [place], [facility]            metFORMIN (GLUCOPHAGE) 500 MG tablet [Pharmacy Med Name: METFORMIN HYDROCHLORIDE 500 MG Tablet] 360 tablet 1    Sig: TAKE 2 TABLETS (1,000 MG TOTAL) BY MOUTH 2 (TWO) TIMES DAILY WITH A MEAL.     Endocrinology:  Diabetes - Biguanides Failed - 05/24/2021 12:21 PM      Failed - B12 Level in normal range and within 720 days    Vitamin B-12  Date Value Ref Range Status  03/08/2021 1,622 (H) 232 - 1,245 pg/mL Final         Passed - Cr in normal range and within 360 days    Creatinine, Ser  Date Value Ref Range Status  03/08/2021 1.02 0.76 - 1.27 mg/dL Final   Creatinine, Urine  Date Value Ref Range Status  03/05/2019 82 mg/dL Final    Comment:    Performed at [facility], [address] [place], [place] [address]         Passed - HBA1C is between 0 and 7.9 and within 180 days    HB A1C (BAYER DCA - WAIVED)  Date Value Ref Range Status  07/28/2020 7.5 (H) <7.0  % Final    Comment:                                          Diabetic Adult            <7.0                                       Healthy Adult        4.3 - 5.7                                                           (DCCT/NGSP) American Diabetes Association's Summary of Glycemic Recommendations for Adults with Diabetes: Hemoglobin A1c <7.0%. More stringent glycemic goals (A1c <6.0%) may further reduce complications at  the cost of increased risk of hypoglycemia.    Hgb A1c MFr Bld  Date Value Ref Range Status  03/08/2021 6.5 (H) 4.8 - 5.6 % Final    Comment:             Prediabetes: 5.7 - 6.4          Diabetes: >6.4          Glycemic control for adults with diabetes: <7.0          Passed - eGFR in normal range and within 360 days    GFR calc Af Amer  Date Value Ref Range Status  01/15/2020 89 >59 mL/min/1.73 Final    Comment:    **[facility] currently reports eGFR in compliance with the current**   recommendations of the [employer]. [facility] will   update reporting as new guidelines are published from the NKF-ASN   Task force.    GFR calc non Af Amer  Date Value Ref Range Status  01/15/2020 77 >59 mL/min/1.73 Final   eGFR  Date Value Ref Range Status  03/08/2021 76 >59 mL/min/1.73 Final         Passed - Valid encounter within last 6 months    Recent Outpatient Visits          2 months ago Hypertension associated with diabetes ([place])   [facility] Jon Billings, NP   6 months ago Diabetes mellitus associated with hormonal etiology ([place])   [place], Lauren A, NP   8 months ago Pre-op evaluation   [facility] Jon Billings, NP   10 months ago Routine general medical examination at a health care facility   [facility], [place], DO   1 year ago Diabetes mellitus associated with hormonal etiology ([place])   [employer], Megan P, DO      Future Appointments             In 2 weeks Jon Billings, NP [facility], [facility]   In 2 months  [facility], [facility]           Passed - CBC within normal limits and completed in the last 12 months    WBC  Date Value Ref Range Status  09/09/2020 8.8 3.4 - 10.8 x10E3/uL Final  03/10/2019 6.4 4.0 - 10.5 K/uL Final   RBC  Date Value Ref Range Status  09/09/2020 4.94 4.14 - 5.80 x10E6/uL Final  03/10/2019 4.14 (L) 4.22 - 5.81 MIL/uL Final   Hemoglobin  Date Value Ref Range Status  09/09/2020 15.6 13.0 - 17.7 g/dL Final   Hematocrit  Date Value Ref Range Status  09/09/2020 44.8 37.5 - 51.0 % Final   MCHC  Date Value Ref Range Status  09/09/2020 34.8 31.5 - 35.7 g/dL Final  03/10/2019 34.6 30.0 - 36.0 g/dL Final   [facility]  Date Value Ref Range Status  09/09/2020 31.6 26.6 - 33.0 pg Final  03/10/2019 31.4 26.0 - 34.0 pg Final   MCV  Date Value Ref Range Status  09/09/2020 91 79 - 97 fL Final   No results found for: PLTCOUNTKUC, LABPLAT, POCPLA RDW  Date Value Ref Range Status  09/09/2020 13.2 11.6 - 15.4 % Final          losartan (COZAAR) 100 MG tablet [Pharmacy Med Name: LOSARTAN POTASSIUM 100 MG Tablet] 90 tablet 2    Sig: TAKE 1 TABLET EVERY DAY     Cardiovascular:  Angiotensin Receptor Blockers  Failed - 05/24/2021 12:21 PM      Failed - Last BP in normal range    BP Readings from Last 1 Encounters:  03/08/21 (!) 150/70         Passed - Cr in normal range and within 180 days    Creatinine, Ser  Date Value Ref Range Status  03/08/2021 1.02 0.76 - 1.27 mg/dL Final   Creatinine, Urine  Date Value Ref Range Status  03/05/2019 82 mg/dL Final    Comment:    Performed at [facility], [address] [place], [place] [address]         Passed - K in normal range and within 180 days    Potassium  Date Value Ref Range Status  03/08/2021 4.5 3.5 - 5.2 mmol/L Final         Passed - Patient is not pregnant      Passed - Valid encounter within last 6  months    Recent Outpatient Visits          2 months ago Hypertension associated with diabetes ([place])   [facility] Jon Billings, NP   6 months ago Diabetes mellitus associated with hormonal etiology ([place])   [place], Lauren A, NP   8 months ago Pre-op evaluation   [facility] Jon Billings, NP   10 months ago Routine general medical examination at a health care facility   [facility], [place], DO   1 year ago Diabetes mellitus associated with hormonal etiology ([place])   [place], [place], DO      Future Appointments            In 2 weeks Jon Billings, NP [place], [facility]   In 2 months  [place], [facility]            allopurinol (ZYLOPRIM) 300 MG tablet [Pharmacy Med Name: ALLOPURINOL 300 MG Tablet] 90 tablet 2    Sig: TAKE [place]     Endocrinology:  Gout Agents - allopurinol Failed - 05/24/2021 12:21 PM      Failed - Uric Acid in normal range and within 360 days    Uric Acid  Date Value Ref Range Status  07/28/2020 2.4 (L) 3.8 - 8.4 mg/dL Final    Comment:               Therapeutic target for gout patients: <6.0         Passed - Cr in normal range and within 360 days    Creatinine, Ser  Date Value Ref Range Status  03/08/2021 1.02 0.76 - 1.27 mg/dL Final   Creatinine, Urine  Date Value Ref Range Status  03/05/2019 82 mg/dL Final    Comment:    Performed at [facility], [address] [place],  [address]         Passed - Valid encounter within last 12 months    Recent Outpatient Visits          2 months ago Hypertension associated with diabetes [facility])   [facility] Jon Billings, NP   6 months ago Diabetes mellitus associated with hormonal etiology ([place])   [place], Lauren A, NP   8 months ago Pre-op evaluation   [place], NP    10 months ago Routine general medical examination at a health care facility   [place], [place]  P, DO   1 year ago Diabetes mellitus associated with hormonal etiology ([place])   [place], Megan P, DO      Future Appointments            In 2 weeks Jon Billings, NP [facility], [facility]   In 2 months  [facility], [facility]           Passed - CBC within normal limits and completed in the last 12 months    WBC  Date Value Ref Range Status  09/09/2020 8.8 3.4 - 10.8 x10E3/uL Final  03/10/2019 6.4 4.0 - 10.5 K/uL Final   RBC  Date Value Ref Range Status  09/09/2020 4.94 4.14 - 5.80 x10E6/uL Final  03/10/2019 4.14 (L) 4.22 - 5.81 MIL/uL Final   Hemoglobin  Date Value Ref Range Status  09/09/2020 15.6 13.0 - 17.7 g/dL Final   Hematocrit  Date Value Ref Range Status  09/09/2020 44.8 37.5 - 51.0 % Final   MCHC  Date Value Ref Range Status  09/09/2020 34.8 31.5 - 35.7 g/dL Final  03/10/2019 34.6 30.0 - 36.0 g/dL Final   [facility]  Date Value Ref Range Status  09/09/2020 31.6 26.6 - 33.0 pg Final  03/10/2019 31.4 26.0 - 34.0 pg Final   MCV  Date Value Ref Range Status  09/09/2020 91 79 - 97 fL Final   No results found for: PLTCOUNTKUC, LABPLAT, POCPLA RDW  Date Value Ref Range Status  09/09/2020 13.2 11.6 - 15.4 % Final          glipiZIDE (GLUCOTROL) 5 MG tablet [Pharmacy Med Name: GLIPIZIDE 5 MG Tablet] 90 tablet 0    Sig: TAKE 1 TABLET (5 MG TOTAL) BY MOUTH DAILY BEFORE BREAKFAST.     Endocrinology:  Diabetes - Sulfonylureas Passed - 05/24/2021 12:21 PM      Passed - HBA1C is between 0 and 7.9 and within 180 days    HB A1C (BAYER DCA - WAIVED)  Date Value Ref Range Status  07/28/2020 7.5 (H) <7.0 % Final    Comment:                                          Diabetic Adult            <7.0                                       Healthy Adult        4.3 - 5.7                                                            (DCCT/NGSP) American Diabetes Association's Summary of Glycemic Recommendations for Adults with Diabetes: Hemoglobin A1c <7.0%. More stringent glycemic goals (A1c <6.0%) may further reduce complications at the cost of increased risk of hypoglycemia.    Hgb A1c MFr Bld  Date Value Ref Range Status  03/08/2021 6.5 (H) 4.8 - 5.6 % Final    Comment:             Prediabetes: 5.7 - 6.4  Diabetes: >6.4          Glycemic control for adults with diabetes: <7.0          Passed - Cr in normal range and within 360 days    Creatinine, Ser  Date Value Ref Range Status  03/08/2021 1.02 0.76 - 1.27 mg/dL Final   Creatinine, Urine  Date Value Ref Range Status  03/05/2019 82 mg/dL Final    Comment:    Performed at [facility], [address] [place], [place] [address]         Passed - Valid encounter within last 6 months    Recent Outpatient Visits          2 months ago Hypertension associated with diabetes [facility])   [facility] Jon Billings, NP   6 months ago Diabetes mellitus associated with hormonal etiology ([place])   [place], Lauren A, NP   8 months ago Pre-op evaluation   [facility] Jon Billings, NP   10 months ago Routine general medical examination at a health care facility   [facility], [place], DO   1 year ago Diabetes mellitus associated with hormonal etiology [facility])   [place], [place], DO      Future Appointments            In 2 weeks Jon Billings, NP [place], [facility]   In 2 months  [place], [facility]            meloxicam (MOBIC) 15 MG tablet [Pharmacy Med Name: MELOXICAM 15 MG Tablet] 90 tablet 2    Sig: TAKE 1 TABLET EVERY DAY     Analgesics:  COX2 Inhibitors Failed - 05/24/2021 12:21 PM      Failed - Manual Review: Labs are only required if the patient has taken medication for more than 8 weeks.       Passed - HGB in normal range and within 360 days    Hemoglobin  Date Value Ref Range Status  09/09/2020 15.6 13.0 - 17.7 g/dL Final         Passed - Cr in normal range and within 360 days    Creatinine, Ser  Date Value Ref Range Status  03/08/2021 1.02 0.76 - 1.27 mg/dL Final   Creatinine, Urine  Date Value Ref Range Status  03/05/2019 82 mg/dL Final    Comment:    Performed at [facility], [address] [place], [place] [address]         Passed - HCT in normal range and within 360 days    Hematocrit  Date Value Ref Range Status  09/09/2020 44.8 37.5 - 51.0 % Final         Passed - AST in normal range and within 360 days    AST  Date Value Ref Range Status  03/08/2021 16 0 - 40 IU/L Final   AST (SGOT) Piccolo, Waived  Date Value Ref Range Status  03/28/2018 28 11 - 38 U/L Final         Passed - ALT in normal range and within 360 days    ALT  Date Value Ref Range Status  03/08/2021 25 0 - 44 IU/L Final   ALT (SGPT) Piccolo, Waived  Date Value Ref Range Status  03/28/2018 35 10 - 47 U/L Final         Passed - eGFR is 30 or above and within 360 days  GFR calc Af Amer  Date Value Ref Range Status  01/15/2020 89 >59 mL/min/1.73 Final    Comment:    **[facility] currently reports eGFR in compliance with the current**   recommendations of the [employer]. [facility] will   update reporting as new guidelines are published from the NKF-ASN   Task force.    GFR calc non Af Amer  Date Value Ref Range Status  01/15/2020 77 >59 mL/min/1.73 Final   eGFR  Date Value Ref Range Status  03/08/2021 76 >59 mL/min/1.73 Final         Passed - Patient is not pregnant      Passed - Valid encounter within last 12 months    Recent Outpatient Visits          2 months ago Hypertension associated with diabetes ([place])   [facility] Jon Billings, NP   6 months ago Diabetes mellitus associated with hormonal etiology ([place])   [place], Lauren A, NP   8 months ago Pre-op evaluation   [place], Karen, NP   10 months ago Routine general medical examination at a health care facility   [facility], [place] P, DO   1 year ago Diabetes mellitus associated with hormonal etiology [facility])   [place], [place], DO      Future Appointments            In 2 weeks Jon Billings, NP [facility], [facility]   In 2 months  [place], [facility]            amLODipine ([place]) 10 MG tablet [Pharmacy Med Name: AMLODIPINE BESYLATE 10 MG Tablet] 90 tablet 2    Sig: TAKE 1 TABLET EVERY DAY     Cardiovascular: Calcium Channel Blockers 2 Failed - 05/24/2021 12:21 PM      Failed - Last BP in normal range    BP Readings from Last 1 Encounters:  03/08/21 (!) 150/70         Passed - Last Heart Rate in normal range    Pulse Readings from Last 1 Encounters:  03/08/21 (!) 41         Passed - Valid encounter within last 6 months    Recent Outpatient Visits          2 months ago Hypertension associated with diabetes ([place])   [facility] Jon Billings, NP   6 months ago Diabetes mellitus associated with hormonal etiology ([place])   [place], Lauren A, NP   8 months ago Pre-op evaluation   [facility] Jon Billings, NP   10 months ago Routine general medical examination at a health care facility   [facility], [place] P, DO   1 year ago Diabetes mellitus associated with hormonal etiology [facility])   [place], Megan P, DO      Future Appointments            In 2 weeks Jon Billings, NP [facility], [facility]   In 2 months  [facility], [place]

## 2021-05-31 ENCOUNTER — Other Ambulatory Visit: Payer: Self-pay | Admitting: Nurse Practitioner

## 2021-05-31 DIAGNOSIS — E1169 Type 2 diabetes mellitus with other specified complication: Secondary | ICD-10-CM

## 2021-05-31 NOTE — Telephone Encounter (Signed)
Medication Refill - Medication:  allopurinol (ZYLOPRIM) 300 MG tablet  losartan (COZAAR) 100 MG tablet  tamsulosin (FLOMAX) 0.4 MG CAPS capsule  amLODipine (NORVASC) 10 MG tablet  rosuvastatin (CRESTOR) 40 MG tablet  meloxicam (MOBIC) 15 MG tablet  Has the patient contacted their pharmacy? Yes.   Contact PCP- pt wife states he is completely out  Preferred Pharmacy (with phone number or street name):  [facility] - [place], [place] - [address]  Phone:  [phone] Fax:  [phone]   Has the patient been seen for an appointment in the last year OR does the patient have an upcoming appointment? Yes.    Agent: Please be advised that RX refills may take up to 3 business days. We ask that you follow-up with your pharmacy.

## 2021-05-31 NOTE — Telephone Encounter (Signed)
Medications filled on 03/11/21 #90/2.   Requested Prescriptions  Pending Prescriptions Disp Refills   allopurinol (ZYLOPRIM) 300 MG tablet 90 tablet 2    Sig: Take 1 tablet (300 mg total) by mouth daily.     Endocrinology:  Gout Agents - allopurinol Failed - 05/31/2021  4:09 PM      Failed - Uric Acid in normal range and within 360 days    Uric Acid  Date Value Ref Range Status  07/28/2020 2.4 (L) 3.8 - 8.4 mg/dL Final    Comment:               Therapeutic target for gout patients: <6.0         Passed - Cr in normal range and within 360 days    Creatinine, Ser  Date Value Ref Range Status  03/08/2021 1.02 0.76 - 1.27 mg/dL Final   Creatinine, Urine  Date Value Ref Range Status  03/05/2019 82 mg/dL Final    Comment:    Performed at [facility], [address], [place], [place] [address]         Passed - Valid encounter within last 12 months    Recent Outpatient Visits          2 months ago Hypertension associated with diabetes ([place])   [facility] Jon Billings, NP   6 months ago Diabetes mellitus associated with hormonal etiology ([place])   [place], Lauren A, NP   8 months ago Pre-op evaluation   [facility] Jon Billings, NP   10 months ago Routine general medical examination at a health care facility   [facility], [place], DO   1 year ago Diabetes mellitus associated with hormonal etiology ([place])   [place], Megan P, DO      Future Appointments            In 1 week Jon Billings, NP [facility], [facility]   In 2 months  [facility], [facility]           Passed - CBC within normal limits and completed in the last 12 months    WBC  Date Value Ref Range Status  09/09/2020 8.8 3.4 - 10.8 x10E3/uL Final  03/10/2019 6.4 4.0 - 10.5 K/uL Final   RBC  Date Value Ref Range Status  09/09/2020 4.94 4.14 - 5.80 x10E6/uL Final  03/10/2019 4.14  (L) 4.22 - 5.81 MIL/uL Final   Hemoglobin  Date Value Ref Range Status  09/09/2020 15.6 13.0 - 17.7 g/dL Final   Hematocrit  Date Value Ref Range Status  09/09/2020 44.8 37.5 - 51.0 % Final   MCHC  Date Value Ref Range Status  09/09/2020 34.8 31.5 - 35.7 g/dL Final  03/10/2019 34.6 30.0 - 36.0 g/dL Final   [facility]  Date Value Ref Range Status  09/09/2020 31.6 26.6 - 33.0 pg Final  03/10/2019 31.4 26.0 - 34.0 pg Final   MCV  Date Value Ref Range Status  09/09/2020 91 79 - 97 fL Final   No results found for: PLTCOUNTKUC, LABPLAT, POCPLA RDW  Date Value Ref Range Status  09/09/2020 13.2 11.6 - 15.4 % Final          losartan (COZAAR) 100 MG tablet 90 tablet 2    Sig: Take 1 tablet (100 mg total) by mouth daily.     Cardiovascular:  Angiotensin Receptor Blockers Failed - 05/31/2021  4:09 PM  Failed - Last BP in normal range    BP Readings from Last 1 Encounters:  03/08/21 (!) 150/70         Passed - Cr in normal range and within 180 days    Creatinine, Ser  Date Value Ref Range Status  03/08/2021 1.02 0.76 - 1.27 mg/dL Final   Creatinine, Urine  Date Value Ref Range Status  03/05/2019 82 mg/dL Final    Comment:    Performed at [facility] [address] [place], [place] [address]         Passed - K in normal range and within 180 days    Potassium  Date Value Ref Range Status  03/08/2021 4.5 3.5 - 5.2 mmol/L Final         Passed - Patient is not pregnant      Passed - Valid encounter within last 6 months    Recent Outpatient Visits          2 months ago Hypertension associated with diabetes ([place])   [facility] Jon Billings, NP   6 months ago Diabetes mellitus associated with hormonal etiology ([place])   [place], Lauren A, NP   8 months ago Pre-op evaluation   [facility] Jon Billings, NP   10 months ago Routine general medical examination at a health care facility   [facility], [place], DO   1 year ago Diabetes mellitus associated with hormonal etiology [facility])   [place], Megan P, DO      Future Appointments            In 1 week Jon Billings, NP [facility], [facility]   In 2 months  [place], [facility]            tamsulosin (FLOMAX) 0.4 MG CAPS capsule 90 capsule 2    Sig: Take 1 capsule (0.4 mg total) by mouth daily.     Urology: Alpha-Adrenergic Blocker Failed - 05/31/2021  4:09 PM      Failed - Last BP in normal range    BP Readings from Last 1 Encounters:  03/08/21 (!) 150/70         Passed - PSA in normal range and within 360 days    Prostate Specific Ag, Serum  Date Value Ref Range Status  07/28/2020 2.4 0.0 - 4.0 ng/mL Final    Comment:    Roche ECLIA methodology. According to the American Urological Association, Serum PSA should decrease and remain at undetectable levels after radical prostatectomy. The AUA defines biochemical recurrence as an initial PSA value 0.2 ng/mL or greater followed by a subsequent confirmatory PSA value 0.2 ng/mL or greater. Values obtained with different assay methods or kits cannot be used interchangeably. Results cannot be interpreted as absolute evidence of the presence or absence of malignant disease.          Passed - Valid encounter within last 12 months    Recent Outpatient Visits          2 months ago Hypertension associated with diabetes [facility])   [facility] Jon Billings, NP   6 months ago Diabetes mellitus associated with hormonal etiology ([place])   [place], Lauren A, NP   8 months ago Pre-op evaluation   [place], NP   10 months ago Routine general medical examination at a health care facility   [facility], [place],  DO   1 year ago Diabetes mellitus associated with hormonal etiology [facility])   [place], Barb Merino, DO       Future Appointments            In 1 week Jon Billings, NP [place], [facility]   In 2 months  [facility], [facility]            amLODipine (NORVASC) 10 MG tablet 90 tablet 2    Sig: Take 1 tablet (10 mg total) by mouth daily.     Cardiovascular: Calcium Channel Blockers 2 Failed - 05/31/2021  4:09 PM      Failed - Last BP in normal range    BP Readings from Last 1 Encounters:  03/08/21 (!) 150/70         Passed - Last Heart Rate in normal range    Pulse Readings from Last 1 Encounters:  03/08/21 (!) 49         Passed - Valid encounter within last 6 months    Recent Outpatient Visits          2 months ago Hypertension associated with diabetes ([place])   [facility] Jon Billings, NP   6 months ago Diabetes mellitus associated with hormonal etiology ([place])   [place], Lauren A, NP   8 months ago Pre-op evaluation   [facility] Jon Billings, NP   10 months ago Routine general medical examination at a health care facility   [facility], [place], DO   1 year ago Diabetes mellitus associated with hormonal etiology [facility])   [place], Megan P, DO      Future Appointments            In 1 week Jon Billings, NP [place], [facility]   In 2 months  [place], [facility]            rosuvastatin (CRESTOR) 40 MG tablet 90 tablet 2    Sig: Take 1 tablet (40 mg total) by mouth daily.     Cardiovascular:  Antilipid - Statins 2 Failed - 05/31/2021  4:09 PM      Failed - Lipid Panel in normal range within the last 12 months    Cholesterol, Total  Date Value Ref Range Status  03/08/2021 128 100 - 199 mg/dL Final   Cholesterol Piccolo, Waived  Date Value Ref Range Status  03/28/2018 122 <200 mg/dL Final    Comment:                            Desirable                <200                         Borderline High      200- 239                          High                     >239    LDL Chol Calc (NIH)  Date Value Ref Range Status  03/08/2021 72 0 - 99 mg/dL Final   HDL  Date Value Ref Range Status  03/08/2021 36 (L) >39 mg/dL Final   Triglycerides  Date Value Ref  Range Status  03/08/2021 106 0 - 149 mg/dL Final   Triglycerides Piccolo,Waived  Date Value Ref Range Status  03/28/2018 232 (H) <150 mg/dL Final    Comment:                            Normal                   <150                         Borderline High     150 - 199                         High                200 - 499                         Very High                >499          Passed - Cr in normal range and within 360 days    Creatinine, Ser  Date Value Ref Range Status  03/08/2021 1.02 0.76 - 1.27 mg/dL Final   Creatinine, Urine  Date Value Ref Range Status  03/05/2019 82 mg/dL Final    Comment:    Performed at [facility], [address], [place], [place] [address]         [place] - Patient is not pregnant      Passed - Valid encounter within last 12 months    Recent Outpatient Visits          2 months ago Hypertension associated with diabetes ([place])   [facility] Jon Billings, NP   6 months ago Diabetes mellitus associated with hormonal etiology ([place])   [place], Lauren A, NP   8 months ago Pre-op evaluation   [facility] Jon Billings, NP   10 months ago Routine general medical examination at a health care facility   [facility], [place], DO   1 year ago Diabetes mellitus associated with hormonal etiology [facility])   [place], Megan P, DO      Future Appointments            In 1 week Jon Billings, NP [place], [facility]   In 2 months  [facility], [facility]            meloxicam (MOBIC) 15 MG tablet 90 tablet 2    Sig: Take 1 tablet (15 mg total) by mouth daily.     Analgesics:   COX2 Inhibitors Failed - 05/31/2021  4:09 PM      Failed - Manual Review: Labs are only required if the patient has taken medication for more than 8 weeks.      Passed - HGB in normal range and within 360 days    Hemoglobin  Date Value Ref Range Status  09/09/2020 15.6 13.0 - 17.7 g/dL Final         Passed - Cr in normal range and within 360 days    Creatinine, Ser  Date Value Ref Range Status  03/08/2021 1.02 0.76 - 1.27 mg/dL Final   Creatinine, Urine  Date Value Ref Range Status  03/05/2019 82 mg/dL Final  Comment:    Performed at [facility], [address] [place],  [address]         Passed - HCT in normal range and within 360 days    Hematocrit  Date Value Ref Range Status  09/09/2020 44.8 37.5 - 51.0 % Final         Passed - AST in normal range and within 360 days    AST  Date Value Ref Range Status  03/08/2021 16 0 - 40 IU/L Final   AST (SGOT) Piccolo, Waived  Date Value Ref Range Status  03/28/2018 28 11 - 38 U/L Final         Passed - ALT in normal range and within 360 days    ALT  Date Value Ref Range Status  03/08/2021 25 0 - 44 IU/L Final   ALT (SGPT) Piccolo, Waived  Date Value Ref Range Status  03/28/2018 35 10 - 47 U/L Final         Passed - eGFR is 30 or above and within 360 days    GFR calc Af Amer  Date Value Ref Range Status  01/15/2020 89 >59 mL/min/1.73 Final    Comment:    **[facility] currently reports eGFR in compliance with the current**   recommendations of the [employer]. [facility] will   update reporting as new guidelines are published from the NKF-ASN   Task force.    GFR calc non Af Amer  Date Value Ref Range Status  01/15/2020 77 >59 mL/min/1.73 Final   eGFR  Date Value Ref Range Status  03/08/2021 76 >59 mL/min/1.73 Final         Passed - Patient is not pregnant      Passed - Valid encounter within last 12 months    Recent Outpatient Visits          2 months ago Hypertension  associated with diabetes ([place])   [facility] Jon Billings, NP   6 months ago Diabetes mellitus associated with hormonal etiology ([place])   [place], Lauren A, NP   8 months ago Pre-op evaluation   [facility] Jon Billings, NP   10 months ago Routine general medical examination at a health care facility   [facility], [place] P, DO   1 year ago Diabetes mellitus associated with hormonal etiology [facility])   [place], [place], DO      Future Appointments            In 1 week Jon Billings, NP [place], [place]   In 2 months  [facility], [place]

## 2021-06-01 ENCOUNTER — Other Ambulatory Visit: Payer: Self-pay | Admitting: Family Medicine

## 2021-06-01 ENCOUNTER — Other Ambulatory Visit: Payer: Self-pay | Admitting: Nurse Practitioner

## 2021-06-01 DIAGNOSIS — E1169 Type 2 diabetes mellitus with other specified complication: Secondary | ICD-10-CM

## 2021-06-01 NOTE — Telephone Encounter (Signed)
Requests are early. Should still have refills. Requested Prescriptions  Refused Prescriptions Disp Refills   losartan (COZAAR) 100 MG tablet [Pharmacy Med Name: LOSARTAN POTASSIUM 100 MG Tablet] 90 tablet 2    Sig: TAKE 1 TABLET EVERY DAY     Cardiovascular:  Angiotensin Receptor Blockers Failed - 06/01/2021  8:06 AM      Failed - Last BP in normal range    BP Readings from Last 1 Encounters:  03/08/21 (!) 150/70         Passed - Cr in normal range and within 180 days    Creatinine, Ser  Date Value Ref Range Status  03/08/2021 1.02 0.76 - 1.27 mg/dL Final   Creatinine, Urine  Date Value Ref Range Status  03/05/2019 82 mg/dL Final    Comment:    Performed at [facility], [address] [place], [place] [address]         Passed - K in normal range and within 180 days    Potassium  Date Value Ref Range Status  03/08/2021 4.5 3.5 - 5.2 mmol/L Final         Passed - Patient is not pregnant      Passed - Valid encounter within last 6 months    Recent Outpatient Visits          2 months ago Hypertension associated with diabetes ([place])   [facility] Jon Billings, NP   6 months ago Diabetes mellitus associated with hormonal etiology ([place])   [place], Lauren A, NP   8 months ago Pre-op evaluation   [facility] Jon Billings, NP   10 months ago Routine general medical examination at a health care facility   [facility], [place], DO   1 year ago Diabetes mellitus associated with hormonal etiology [facility])   [place], [place], DO      Future Appointments            In 1 week Jon Billings, NP [place], [place]   In 2 months  [place], [facility]            allopurinol (ZYLOPRIM) 300 MG tablet [Pharmacy Med Name: ALLOPURINOL 300 MG Tablet] 90 tablet 2    Sig: TAKE [place]     Endocrinology:  Gout Agents - allopurinol Failed  - 06/01/2021  8:06 AM      Failed - Uric Acid in normal range and within 360 days    Uric Acid  Date Value Ref Range Status  07/28/2020 2.4 (L) 3.8 - 8.4 mg/dL Final    Comment:               Therapeutic target for gout patients: <6.0         Passed - Cr in normal range and within 360 days    Creatinine, Ser  Date Value Ref Range Status  03/08/2021 1.02 0.76 - 1.27 mg/dL Final   Creatinine, Urine  Date Value Ref Range Status  03/05/2019 82 mg/dL Final    Comment:    Performed at [facility], [address] [place], [place] [address]         Passed - Valid encounter within last 12 months    Recent Outpatient Visits          2 months ago Hypertension associated with diabetes [facility])   [facility] Jon Billings, NP   6 months ago Diabetes  mellitus associated with hormonal etiology ([place])   [place], Lauren A, NP   8 months ago Pre-op evaluation   [facility] Jon Billings, NP   10 months ago Routine general medical examination at a health care facility   [facility], [place] P, DO   1 year ago Diabetes mellitus associated with hormonal etiology [facility])   [place], Megan P, DO      Future Appointments            In 1 week Jon Billings, NP [place], [facility]   In 2 months  [employer], [facility]           Passed - CBC within normal limits and completed in the last 12 months    WBC  Date Value Ref Range Status  09/09/2020 8.8 3.4 - 10.8 x10E3/uL Final  03/10/2019 6.4 4.0 - 10.5 K/uL Final   RBC  Date Value Ref Range Status  09/09/2020 4.94 4.14 - 5.80 x10E6/uL Final  03/10/2019 4.14 (L) 4.22 - 5.81 MIL/uL Final   Hemoglobin  Date Value Ref Range Status  09/09/2020 15.6 13.0 - 17.7 g/dL Final   Hematocrit  Date Value Ref Range Status  09/09/2020 44.8 37.5 - 51.0 % Final   MCHC  Date Value Ref Range Status  09/09/2020 34.8 31.5 -  35.7 g/dL Final  03/10/2019 34.6 30.0 - 36.0 g/dL Final   [facility]  Date Value Ref Range Status  09/09/2020 31.6 26.6 - 33.0 pg Final  03/10/2019 31.4 26.0 - 34.0 pg Final   MCV  Date Value Ref Range Status  09/09/2020 91 79 - 97 fL Final   No results found for: PLTCOUNTKUC, LABPLAT, POCPLA RDW  Date Value Ref Range Status  09/09/2020 13.2 11.6 - 15.4 % Final          rosuvastatin (CRESTOR) 40 MG tablet [Pharmacy Med Name: ROSUVASTATIN CALCIUM 40 MG Tablet] 90 tablet 2    Sig: TAKE 1 TABLET EVERY DAY     Cardiovascular:  Antilipid - Statins 2 Failed - 06/01/2021  8:06 AM      Failed - Lipid Panel in normal range within the last 12 months    Cholesterol, Total  Date Value Ref Range Status  03/08/2021 128 100 - 199 mg/dL Final   Cholesterol Piccolo, Waived  Date Value Ref Range Status  03/28/2018 122 <200 mg/dL Final    Comment:                            Desirable                <200                         Borderline High      200- 239                         High                     >239    LDL Chol Calc (NIH)  Date Value Ref Range Status  03/08/2021 72 0 - 99 mg/dL Final   HDL  Date Value Ref Range Status  03/08/2021 36 (L) >39 mg/dL Final   Triglycerides  Date Value Ref Range Status  03/08/2021 106 0 - 149 mg/dL Final   Triglycerides  Piccolo,Waived  Date Value Ref Range Status  03/28/2018 232 (H) <150 mg/dL Final    Comment:                            Normal                   <150                         Borderline High     150 - 199                         High                200 - 499                         Very High                >499          Passed - Cr in normal range and within 360 days    Creatinine, Ser  Date Value Ref Range Status  03/08/2021 1.02 0.76 - 1.27 mg/dL Final   Creatinine, Urine  Date Value Ref Range Status  03/05/2019 82 mg/dL Final    Comment:    Performed at [facility], [address] [place], [place]  [address]         [place] - Patient is not pregnant      Passed - Valid encounter within last 12 months    Recent Outpatient Visits          2 months ago Hypertension associated with diabetes ([place])   [facility] Jon Billings, NP   6 months ago Diabetes mellitus associated with hormonal etiology ([place])   [place], Lauren A, NP   8 months ago Pre-op evaluation   [facility] Jon Billings, NP   10 months ago Routine general medical examination at a health care facility   [facility], [place] P, DO   1 year ago Diabetes mellitus associated with hormonal etiology [facility])   [place], [place], DO      Future Appointments            In 1 week Jon Billings, NP [place], [facility]   In 2 months  [place], [facility]            meloxicam (MOBIC) 15 MG tablet [Pharmacy Med Name: MELOXICAM 15 MG Tablet] 90 tablet 2    Sig: TAKE 1 TABLET EVERY DAY     Analgesics:  COX2 Inhibitors Failed - 06/01/2021  8:06 AM      Failed - Manual Review: Labs are only required if the patient has taken medication for more than 8 weeks.      Passed - HGB in normal range and within 360 days    Hemoglobin  Date Value Ref Range Status  09/09/2020 15.6 13.0 - 17.7 g/dL Final         Passed - Cr in normal range and within 360 days    Creatinine, Ser  Date Value Ref Range Status  03/08/2021 1.02 0.76 - 1.27 mg/dL Final   Creatinine, Urine  Date Value Ref Range Status  03/05/2019 82 mg/dL Final    Comment:    Performed at [facility], [address] [place], [place] [address]         Passed - HCT in normal range and within 360 days    Hematocrit  Date Value Ref Range Status  09/09/2020 44.8 37.5 - 51.0 % Final         Passed - AST in normal range and within 360 days    AST  Date Value Ref Range Status  03/08/2021 16 0 - 40 IU/L Final   AST (SGOT) Piccolo, Waived   Date Value Ref Range Status  03/28/2018 28 11 - 38 U/L Final         Passed - ALT in normal range and within 360 days    ALT  Date Value Ref Range Status  03/08/2021 25 0 - 44 IU/L Final   ALT (SGPT) Piccol[place], Waived  Date Value Ref Range Status  03/28/2018 35 10 - 47 U/L Final         Passed - eGFR is 30 or above and within 360 days    GFR calc Af Amer  Date Value Ref Range Status  01/15/2020 89 >59 mL/min/1.73 Final    Comment:    **[facility] currently reports eGFR in compliance with the current**   recommendations of the [employer]. [facility] will   update reporting as new guidelines are published from the NKF-ASN   Task force.    GFR calc non Af Amer  Date Value Ref Range Status  01/15/2020 77 >59 mL/min/1.73 Final   eGFR  Date Value Ref Range Status  03/08/2021 76 >59 mL/min/1.73 Final         Passed - Patient is not pregnant      Passed - Valid encounter within last 12 months    Recent Outpatient Visits          2 months ago Hypertension associated with diabetes ([place])   [facility] Jon Billings, NP   6 months ago Diabetes mellitus associated with hormonal etiology ([place])   [place], Lauren A, NP   8 months ago Pre-op evaluation   [facility] Jon Billings, NP   10 months ago Routine general medical examination at a health care facility   [facility], [place], DO   1 year ago Diabetes mellitus associated with hormonal etiology [facility])   [place], [place], DO      Future Appointments            In 1 week Jon Billings, NP [place], [facility]   In 2 months  [place], [facility]            glipiZIDE (GLUCOTROL) 5 MG tablet [Pharmacy Med Name: GLIPIZIDE 5 MG Tablet] 90 tablet 0    Sig: TAKE 1 TABLET (5 MG TOTAL) BY MOUTH DAILY BEFORE BREAKFAST.     Endocrinology:  Diabetes - Sulfonylureas Passed - 06/01/2021  8:06 AM       Passed - HBA1C is between 0 and 7.9 and within 180 days    HB A1C (BAYER DCA - WAIVED)  Date Value Ref Range Status  07/28/2020 7.5 (H) <7.0 % Final    Comment:                                          Diabetic Adult            <  7.0                                       Healthy Adult        4.3 - 5.7                                                           (DCCT/NGSP) American Diabetes Association's Summary of Glycemic Recommendations for Adults with Diabetes: Hemoglobin A1c <7.0%. More stringent glycemic goals (A1c <6.0%) may further reduce complications at the cost of increased risk of hypoglycemia.    Hgb A1c MFr Bld  Date Value Ref Range Status  03/08/2021 6.5 (H) 4.8 - 5.6 % Final    Comment:             Prediabetes: 5.7 - 6.4          Diabetes: >6.4          Glycemic control for adults with diabetes: <7.0          Passed - Cr in normal range and within 360 days    Creatinine, Ser  Date Value Ref Range Status  03/08/2021 1.02 0.76 - 1.27 mg/dL Final   Creatinine, Urine  Date Value Ref Range Status  03/05/2019 82 mg/dL Final    Comment:    Performed at [facility], [address], [place], [place] [address]         Passed - Valid encounter within last 6 months    Recent Outpatient Visits          2 months ago Hypertension associated with diabetes ([place])   [facility] Jon Billings, NP   6 months ago Diabetes mellitus associated with hormonal etiology ([place])   [place], Lauren A, NP   8 months ago Pre-op evaluation   [facility] Jon Billings, NP   10 months ago Routine general medical examination at a health care facility   [facility], [place], DO   1 year ago Diabetes mellitus associated with hormonal etiology [facility])   [place], Barb Mer[place], DO      Future Appointments            In 1 week Jon Billings, NP [place], [facility]    In 2 months  [place], [facility]            amLODipine (NORVASC) 10 MG tablet [Pharmacy Med Name: AMLODIPINE BESYLATE 10 MG Tablet] 90 tablet 2    Sig: TAKE 1 TABLET EVERY DAY     Cardiovascular: Calcium Channel Blockers 2 Failed - 06/01/2021  8:06 AM      Failed - Last BP in normal range    BP Readings from Last 1 Encounters:  03/08/21 (!) 150/70         Passed - Last Heart Rate in normal range    Pulse Readings from Last 1 Encounters:  03/08/21 (!) 49         Passed - Valid encounter within last 6 months    Recent Outpatient Visits          2 months ago Hypertension associated with diabetes [facility])   [facility] [place], [place],  NP   6 months ago Diabetes mellitus associated with hormonal etiology ([place])   [place], Lauren A, NP   8 months ago Pre-op evaluation   [facility] Jon Billings, NP   10 months ago Routine general medical examination at a health care facility   [facility], [place], DO   1 year ago Diabetes mellitus associated with hormonal etiology [facility])   [place], [place], DO      Future Appointments            In 1 week Jon Billings, NP [place], [place]   In 2 months  [facility], [place]

## 2021-06-01 NOTE — Telephone Encounter (Signed)
Requested medication (s) are due for refill today:   No  Requested medication (s) are on the active medication list:   No  Future visit scheduled:   Yes   Last ordered: Discontinued 09/09/2020  Returned because this has been discontinued.   Requested Prescriptions  Pending Prescriptions Disp Refills   Vitamin D, Ergocalciferol, (DRISDOL) 1.25 MG (50000 UNIT) CAPS capsule [Pharmacy Med Name: VITAMIN D 1.25 MG (50000 UT) Capsule] 12 capsule 1    Sig: TAKE 1 CAPSULE EVERY 7 DAYS     Endocrinology:  Vitamins - Vitamin D Supplementation 2 Failed - 06/01/2021  8:06 AM      Failed - Manual Review: Route requests for 50,000 IU strength to the provider      Passed - Ca in normal range and within 360 days    Calcium  Date Value Ref Range Status  03/08/2021 9.8 8.6 - 10.2 mg/dL Final          Passed - Vitamin D in normal range and within 360 days    Vit D, 25-Hydroxy  Date Value Ref Range Status  03/08/2021 33.6 30.0 - 100.0 ng/mL Final    Comment:    Vitamin D deficiency has been defined by the Institute of Medicine and an Endocrine Society practice guideline as a level of serum 25-OH vitamin D less than 20 ng/mL (1,2). The Endocrine Society went on to further define vitamin D insufficiency as a level between 21 and 29 ng/mL (2). 1. IOM (Institute of Medicine). 2010. Dietary reference    intakes for calcium and D. [place]: The    [employer]. 2. Holick MF, Binkley Montgomery, Bischoff-Ferrari HA, et al.    Evaluation, treatment, and prevention of vitamin D    deficiency: an Endocrine Society clinical practice    guideline. JCEM. 2011 Jul; 96(7):1911-30.           Passed - Valid encounter within last 12 months    Recent Outpatient Visits           2 months ago Hypertension associated with diabetes (HCC)   [facility] Larae Grooms, NP   6 months ago Diabetes mellitus associated with hormonal etiology (HCC)   [facility] [place], Lauren  A, NP   8 months ago Pre-op evaluation   [facility] Larae Grooms, NP   10 months ago Routine general medical examination at a health care facility   [facility], [place], DO   1 year ago Diabetes mellitus associated with hormonal etiology [facility])   [facility] Dorcas Carrow, DO       Future Appointments             In 1 week Larae Grooms, NP [facility], [facility]   In 2 months  [facility], [facility]

## 2021-06-01 NOTE — Telephone Encounter (Signed)
Request refill too early. Requested Prescriptions  Refused Prescriptions Disp Refills   tamsulosin (FLOMAX) 0.4 MG CAPS capsule [Pharmacy Med Name: TAMSULOSIN HYDROCHLORIDE 0.4 MG Capsule] 90 capsule 2    Sig: TAKE [place]     Urology: Alpha-Adrenergic Blocker Failed - 06/01/2021  8:06 AM      Failed - Last BP in normal range    BP Readings from Last 1 Encounters:  03/08/21 (!) 150/70         Passed - PSA in normal range and within 360 days    Prostate Specific Ag, Serum  Date Value Ref Range Status  07/28/2020 2.4 0.0 - 4.0 ng/mL Final    Comment:    Roche ECLIA methodology. According to the American Urological Association, Serum PSA should decrease and remain at undetectable levels after radical prostatectomy. The AUA defines biochemical recurrence as an initial PSA value 0.2 ng/mL or greater followed by a subsequent confirmatory PSA value 0.2 ng/mL or greater. Values obtained with different assay methods or kits cannot be used interchangeably. Results cannot be interpreted as absolute evidence of the presence or absence of malignant disease.          Passed - Valid encounter within last 12 months    Recent Outpatient Visits          2 months ago Hypertension associated with diabetes ([place])   [facility] Jon Billings, NP   6 months ago Diabetes mellitus associated with hormonal etiology ([place])   [place], Lauren A, NP   8 months ago Pre-op evaluation   [place], NP   10 months ago Routine general medical examination at a health care facility   [facility], [place], DO   1 year ago Diabetes mellitus associated with hormonal etiology [facility])   [place], [place], DO      Future Appointments            In 1 week Jon Billings, NP [place], [facility]   In 2 months  [place], [facility]            vitamin B-12  (CYANOCOBALAMIN) 1000 MCG tablet [Pharmacy Med Name: VITAMIN B-12 1000 MCG Tablet] 90 tablet 4    Sig: TAKE 1 TABLET (1,000 MCG TOTAL) BY MOUTH DAILY.     Endocrinology:  Vitamins - Vitamin B12 Failed - 06/01/2021  8:06 AM      Failed - B12 Level in normal range and within 360 days    Vitamin B-12  Date Value Ref Range Status  03/08/2021 1,622 (H) 232 - 1,245 pg/mL Final         Passed - HCT in normal range and within 360 days    Hematocrit  Date Value Ref Range Status  09/09/2020 44.8 37.5 - 51.0 % Final         Passed - HGB in normal range and within 360 days    Hemoglobin  Date Value Ref Range Status  09/09/2020 15.6 13.0 - 17.7 g/dL Final         Passed - Valid encounter within last 12 months    Recent Outpatient Visits          2 months ago Hypertension associated with diabetes [facility])   [facility] Jon Billings, NP   6 months ago Diabetes mellitus associated with hormonal etiology ([place])   [place], Lauren A, NP   8 months  ago Pre-op evaluation   [facility] Jon Billings, NP   10 months ago Routine general medical examination at a health care facility   [facility], [place], DO   1 year ago Diabetes mellitus associated with hormonal etiology [facility])   [place], [place], DO      Future Appointments            In 1 week Jon Billings, NP [facility], [place]   In 2 months  [facility], [place]

## 2021-06-03 ENCOUNTER — Other Ambulatory Visit: Payer: Self-pay

## 2021-06-03 DIAGNOSIS — E1169 Type 2 diabetes mellitus with other specified complication: Secondary | ICD-10-CM

## 2021-06-03 MED ORDER — AMLODIPINE BESYLATE 10 MG PO TABS: 10.0000 mg | ORAL_TABLET | Freq: Every day | ORAL | 1 refills | Status: DC

## 2021-06-03 MED ORDER — ROSUVASTATIN CALCIUM 40 MG PO TABS: 40.0000 mg | ORAL_TABLET | Freq: Every day | ORAL | 1 refills | Status: DC

## 2021-06-03 MED ORDER — VITAMIN B-12 1000 MCG PO TABS: 1000.0000 ug | ORAL_TABLET | Freq: Every day | ORAL | 1 refills | Status: DC

## 2021-06-03 MED ORDER — TAMSULOSIN HCL 0.4 MG PO CAPS: 0.4000 mg | ORAL_CAPSULE | Freq: Every day | ORAL | 1 refills | Status: DC

## 2021-06-03 MED ORDER — MELOXICAM 15 MG PO TABS: 15.0000 mg | ORAL_TABLET | Freq: Every day | ORAL | 1 refills | Status: AC

## 2021-06-03 MED ORDER — EMPAGLIFLOZIN 10 MG PO TABS: 10.0000 mg | ORAL_TABLET | Freq: Every day | ORAL | 0 refills | Status: DC

## 2021-06-03 MED ORDER — LOSARTAN POTASSIUM 100 MG PO TABS: 100.0000 mg | ORAL_TABLET | Freq: Every day | ORAL | 1 refills | Status: DC

## 2021-06-03 MED ORDER — GLIPIZIDE 5 MG PO TABS: 5.0000 mg | ORAL_TABLET | Freq: Every day | ORAL | 1 refills | Status: DC

## 2021-06-03 MED ORDER — ALLOPURINOL 300 MG PO TABS: 300.0000 mg | ORAL_TABLET | Freq: Every day | ORAL | 1 refills | Status: DC

## 2021-06-03 MED ORDER — VITAMIN D (CHOLECALCIFEROL) 25 MCG (1000 UT) PO TABS: 2000.0000 [IU] | ORAL_TABLET | Freq: Every day | ORAL | 1 refills | Status: DC

## 2021-06-03 NOTE — Telephone Encounter (Signed)
Refills st to the pharmacy

## 2021-06-03 NOTE — Addendum Note (Signed)
Addended by: Jon Billings on: 06/03/2021 03:43 PM   Modules accepted: Orders

## 2021-06-03 NOTE — Telephone Encounter (Signed)
[facility] called and spoke to [facility], representative about refills for medications. She states that all medications except for Metformin have expired and needs all new prescriptions for these to be filled. Advised her I will send to provider.  Rxs needing refilled:  Allopurinol 300mg , amlodipine 10mg , jardiance 10mg , glipizide 5mg , losartan 100mg , meloxicam 15mg , rosuvastatin 40mg , tamsulosin 0.4mg , vit B12, vit D.

## 2021-06-03 NOTE — Telephone Encounter (Signed)
Called pt/ wife. Call could not be completed. [facility] to [facility]. Several of these medications have refills remaining. Need to clarify.

## 2021-06-03 NOTE — Telephone Encounter (Signed)
Pt's wife called in states that pt went into nursing home and when he was discharged they didn't give her any medications back and is needing several of them refilled. Advised her I will refill if possible and if not will send to provider.

## 2021-06-08 ENCOUNTER — Ambulatory Visit: Payer: Medicare Other | Admitting: Nurse Practitioner

## 2021-06-08 NOTE — Progress Notes (Deleted)
hhhhhhhhhhh ? ?There were no vitals taken for this visit.  ? ?Subjective:  ? ? Patient ID: Carl Bryan, male    DOB: 02-08-1946, 76 y.o.   MRN: [id number] ? ?HPI: ?Carl Bryan is a 76 y.o. male ? ?No chief complaint on file. ? ?HYPERTENSION / HYPERLIPIDEMIA ?Satisfied with current treatment? yes ?Duration of hypertension: years ?BP monitoring frequency: daily ?BP range:  ?BP medication side effects: no ?Past BP meds: amlodipine and losartan (cozaar) ?Duration of hyperlipidemia: years ?Cholesterol medication side effects: no ?Cholesterol supplements: none ?Past cholesterol medications: rosuvastatin (crestor) ?Medication compliance: excellent compliance ?Aspirin: no ?Recent stressors: no ?Recurrent headaches: no ?Visual changes: no ?Palpitations: no ?Dyspnea: no ?Chest pain: no ?Lower extremity edema: no ?Dizzy/lightheaded: no ? ?DIABETES ?Patient states the Metformin is causing him to have "the runs".  He has tried decreasing the dose but it is still causing him to have bathroom problems. Wondering if he can stop the medication. ?Hypoglycemic episodes:no ?Polydipsia/polyuria: no ?Visual disturbance: no ?Chest pain: no ?Paresthesias: no ?Glucose Monitoring: yes ? Accucheck frequency: Daily ? Fasting glucose: 127 today ? Post prandial: ? Evening: ? Before meals: ?Taking Insulin?: no ? Long acting insulin: ? Short acting insulin: ?Blood Pressure Monitoring: daily ?Retinal Examination: Up to Date- will get report ?Foot Exam: Not up to Date ?Diabetic Education: Not Completed ?Pneumovax: Up to Date ?Influenza: Up to Date ?Aspirin: no ? ?Relevant past medical, surgical, family and social history reviewed and updated as indicated. Interim medical history since our last visit reviewed. ?Allergies and medications reviewed and updated. ? ?Review of Systems  ?Eyes:  Negative for visual disturbance.  ?Respiratory:  Negative for chest tightness and shortness of breath.   ?Cardiovascular:  Negative for chest pain, palpitations and leg  swelling.  ?Gastrointestinal:  Positive for diarrhea.  ?Endocrine: Negative for polydipsia and polyuria.  ?Neurological:  Negative for dizziness, light-headedness, numbness and headaches.  ? ?Per HPI unless specifically indicated above ? ?   ?Objective:  ?  ?There were no vitals taken for this visit.  ?Wt Readings from Last 3 Encounters:  ?03/08/21 156 lb 8 oz (71 kg)  ?11/11/20 156 lb 3.2 oz (70.9 kg)  ?09/13/20 163 lb (73.9 kg)  ?  ?Physical Exam ?Vitals and nursing note reviewed.  ?Constitutional:   ?   General: He is not in acute distress. ?   Appearance: Normal appearance. He is not ill-appearing, toxic-appearing or diaphoretic.  ?HENT:  ?   Head: Normocephalic.  ?   Right Ear: External ear normal.  ?   Left Ear: External ear normal.  ?   Nose: Nose normal. No congestion or rhinorrhea.  ?   Mouth/Throat:  ?   Mouth: Mucous membranes are moist.  ?Eyes:  ?   General:     ?   Right eye: No discharge.     ?   Left eye: No discharge.  ?   Extraocular Movements: Extraocular movements intact.  ?   Conjunctiva/sclera: Conjunctivae normal.  ?   Pupils: Pupils are equal, round, and reactive to light.  ?Cardiovascular:  ?   Rate and Rhythm: Normal rate and regular rhythm.  ?   Heart sounds: No murmur heard. ?Pulmonary:  ?   Effort: Pulmonary effort is normal. No respiratory distress.  ?   Breath sounds: Normal breath sounds. No wheezing, rhonchi or rales.  ?Abdominal:  ?   General: Abdomen is flat. Bowel sounds are normal.  ?Musculoskeletal:  ?   Cervical back: Normal range of motion and  neck supple.  ?   Comments: Uses crutches due to L AKA.  ?Skin: ?   General: Skin is warm and dry.  ?   Capillary Refill: Capillary refill takes less than 2 seconds.  ?Neurological:  ?   General: No focal deficit present.  ?   Mental Status: He is alert and oriented to person, place, and time.  ?Psychiatric:     ?   Mood and Affect: Mood normal.     ?   Behavior: Behavior normal.     ?   Thought Content: Thought content normal.     ?    Judgment: Judgment normal.  ? ? ?Results for orders placed or performed in visit on 03/08/21  ?Comp Met (CMET)  ?Result Value Ref Range  ? Glucose 124 (H) 70 - 99 mg/dL  ? BUN 18 8 - 27 mg/dL  ? Creatinine, Ser 1.02 0.76 - 1.27 mg/dL  ? eGFR 76 >59 mL/min/1.73  ? BUN/Creatinine Ratio 18 10 - 24  ? Sodium 146 (H) 134 - 144 mmol/L  ? Potassium 4.5 3.5 - 5.2 mmol/L  ? Chloride 105 96 - 106 mmol/L  ? CO2 22 20 - 29 mmol/L  ? Calcium 9.8 8.6 - 10.2 mg/dL  ? Total Protein 6.5 6.0 - 8.5 g/dL  ? Albumin 4.2 3.7 - 4.7 g/dL  ? Globulin, Total 2.3 1.5 - 4.5 g/dL  ? Albumin/Globulin Ratio 1.8 1.2 - 2.2  ? Bilirubin Total 0.4 0.0 - 1.2 mg/dL  ? Alkaline Phosphatase 84 44 - 121 IU/L  ? AST 16 0 - 40 IU/L  ? ALT 25 0 - 44 IU/L  ?HgB A1c  ?Result Value Ref Range  ? Hgb A1c MFr Bld 6.5 (H) 4.8 - 5.6 %  ? Est. average glucose Bld gHb Est-mCnc 140 mg/dL  ?Lipid Profile  ?Result Value Ref Range  ? Cholesterol, Total 128 100 - 199 mg/dL  ? Triglycerides 106 0 - 149 mg/dL  ? HDL 36 (L) >39 mg/dL  ? VLDL Cholesterol Cal 20 5 - 40 mg/dL  ? LDL Chol Calc (NIH) 72 0 - 99 mg/dL  ? Chol/HDL Ratio 3.6 0.0 - 5.0 ratio  ?B12  ?Result Value Ref Range  ? Vitamin B-12 1,622 (H) 232 - 1,245 pg/mL  ?Vitamin D (25 hydroxy)  ?Result Value Ref Range  ? Vit D, 25-Hydroxy 33.6 30.0 - 100.0 ng/mL  ? ?   ?Assessment & Plan:  ? ?Problem List Items Addressed This Visit   ?  ? Cardiovascular and Mediastinum  ? Hypertension associated with diabetes ([place]) - Primary  ? CAD (coronary artery disease)  ? Aortic atherosclerosis ([place])  ?  ? Endocrine  ? Hyperlipidemia associated with type 2 diabetes mellitus ([place])  ? Diabetes mellitus associated with hormonal etiology ([place])  ?  ? Other  ? Vitamin D deficiency  ? B12 deficiency  ?  ? ?Follow up plan: ?No follow-ups on file. ? ? ? ? ? ? ?

## 2021-07-26 ENCOUNTER — Encounter: Payer: Self-pay | Admitting: Unknown Physician Specialty

## 2021-07-26 ENCOUNTER — Ambulatory Visit (INDEPENDENT_AMBULATORY_CARE_PROVIDER_SITE_OTHER): Payer: Medicare PPO | Admitting: Unknown Physician Specialty

## 2021-07-26 VITALS — BP 127/74 | HR 60 | Temp 98.1°F | Wt 156.0 lb

## 2021-07-26 DIAGNOSIS — J208 Acute bronchitis due to other specified organisms: Secondary | ICD-10-CM | POA: Diagnosis not present

## 2021-07-26 DIAGNOSIS — R051 Acute cough: Secondary | ICD-10-CM | POA: Diagnosis not present

## 2021-07-26 DIAGNOSIS — H1030 Unspecified acute conjunctivitis, unspecified eye: Secondary | ICD-10-CM

## 2021-07-26 MED ORDER — BENZONATATE 200 MG PO CAPS: 200.0000 mg | ORAL_CAPSULE | Freq: Two times a day (BID) | ORAL | 0 refills | Status: DC | PRN

## 2021-07-26 MED ORDER — POLYMYXIN B-TRIMETHOPRIM 10000-0.1 UNIT/ML-% OP SOLN: 1.0000 [drp] | OPHTHALMIC | 0 refills | Status: DC

## 2021-07-26 NOTE — Progress Notes (Signed)
? ?BP 127/74   Pulse 60   Temp 98.1 ?F (36.7 ?C) (Oral)   Wt 156 lb (70.8 kg)   SpO2 97%   BMI 25.18 kg/m?   ? ?Subjective:  ? ? Patient ID: Carl Bryan, male    DOB: [birth year], 76 y.o.   MRN: [id number] ? ?HPI: ?Carl Bryan is a 76 y.o. male ? ?Chief Complaint  ?Patient presents with  ? Sneezing  ?  Patient states he noticed this past Friday. He has had some sneezing and coughing. Patient states the cough has interrupted his sleep pattern. Patient states he has tried over the counter DayQuil and NyQuil. Patient states sometimes he feels it helps and sometimes it feels as if it didn't. Patient denies any phlegm when he has the cough.   ? Cough  ? ?Cough ?This is a new problem. The current episode started in the past 7 days. The problem has been unchanged. Episode frequency: worse at night. The cough is Non-productive. Associated symptoms include nasal congestion and rhinorrhea. Pertinent negatives include no chest pain, chills, ear congestion, ear pain, fever, headaches, heartburn, rash, sore throat, shortness of breath, sweats, weight loss or wheezing. Associated symptoms comments: Eyes matted shut. Nothing aggravates the symptoms. He has tried OTC cough suppressant (robitussin) for the symptoms. The treatment provided no relief.  ? ? ?Relevant past medical, surgical, family and social history reviewed and updated as indicated. Interim medical history since our last visit reviewed. ?Allergies and medications reviewed and updated. ? ?Review of Systems  ?Constitutional:  Negative for chills, fever and weight loss.  ?HENT:  Positive for rhinorrhea. Negative for ear pain and sore throat.   ?Respiratory:  Positive for cough. Negative for shortness of breath and wheezing.   ?Cardiovascular:  Negative for chest pain.  ?Gastrointestinal:  Negative for heartburn.  ?Skin:  Negative for rash.  ?Neurological:  Negative for headaches.  ? ?Per HPI unless specifically indicated above ? ?   ?Objective:  ?  ?BP 127/74   Pulse  60   Temp 98.1 ?F (36.7 ?C) (Oral)   Wt 156 lb (70.8 kg)   SpO2 97%   BMI 25.18 kg/m?   ?Wt Readings from Last 3 Encounters:  ?07/26/21 156 lb (70.8 kg)  ?03/08/21 156 lb 8 oz (71 kg)  ?11/11/20 156 lb 3.2 oz (70.9 kg)  ?  ?Physical Exam ?Vitals and nursing note reviewed.  ?Constitutional:   ?   General: He is not in acute distress. ?   Appearance: Normal appearance. He is well-developed.  ?HENT:  ?   Head: Normocephalic and atraumatic.  ?   Right Ear: Tympanic membrane and ear canal normal.  ?   Left Ear: Tympanic membrane and ear canal normal.  ?   Nose: Rhinorrhea present.  ?   Right Sinus: No maxillary sinus tenderness or frontal sinus tenderness.  ?   Left Sinus: No maxillary sinus tenderness or frontal sinus tenderness.  ?   Mouth/Throat:  ?   Pharynx: Uvula midline.  ?Eyes:  ?   General: Lids are normal. No scleral icterus.    ?   Right eye: No discharge.     ?   Left eye: No discharge.  ?   Conjunctiva/sclera: Conjunctivae normal.  ?Cardiovascular:  ?   Rate and Rhythm: Normal rate and regular rhythm.  ?   Heart sounds: Normal heart sounds.  ?Pulmonary:  ?   Effort: Pulmonary effort is normal. No respiratory distress.  ?   Breath sounds:  Normal breath sounds.  ?   Comments: ? Crackles LLL ?Abdominal:  ?   Palpations: There is no hepatomegaly or splenomegaly.  ?Musculoskeletal:     ?   General: Normal range of motion.  ?   Cervical back: Neck supple.  ?Skin: ?   General: Skin is warm and dry.  ?   Coloration: Skin is not pale.  ?   Findings: No rash.  ?Neurological:  ?   Mental Status: He is alert and oriented to person, place, and time.  ?Psychiatric:     ?   Behavior: Behavior normal.     ?   Thought Content: Thought content normal.     ?   Judgment: Judgment normal.  ? ? ?Results for orders placed or performed in visit on 03/08/21  ?Comp Met (CMET)  ?Result Value Ref Range  ? Glucose 124 (H) 70 - 99 mg/dL  ? BUN 18 8 - 27 mg/dL  ? Creatinine, Ser 1.02 0.76 - 1.27 mg/dL  ? eGFR 76 >59 mL/min/1.73  ?  BUN/Creatinine Ratio 18 10 - 24  ? Sodium 146 (H) 134 - 144 mmol/L  ? Potassium 4.5 3.5 - 5.2 mmol/L  ? Chloride 105 96 - 106 mmol/L  ? CO2 22 20 - 29 mmol/L  ? Calcium 9.8 8.6 - 10.2 mg/dL  ? Total Protein 6.5 6.0 - 8.5 g/dL  ? Albumin 4.2 3.7 - 4.7 g/dL  ? Globulin, Total 2.3 1.5 - 4.5 g/dL  ? Albumin/Globulin Ratio 1.8 1.2 - 2.2  ? Bilirubin Total 0.4 0.0 - 1.2 mg/dL  ? Alkaline Phosphatase 84 44 - 121 IU/L  ? AST 16 0 - 40 IU/L  ? ALT 25 0 - 44 IU/L  ?HgB A1c  ?Result Value Ref Range  ? Hgb A1c MFr Bld 6.5 (H) 4.8 - 5.6 %  ? Est. average glucose Bld gHb Est-mCnc 140 mg/dL  ?Lipid Profile  ?Result Value Ref Range  ? Cholesterol, Total 128 100 - 199 mg/dL  ? Triglycerides 106 0 - 149 mg/dL  ? HDL 36 (L) >39 mg/dL  ? VLDL Cholesterol Cal 20 5 - 40 mg/dL  ? LDL Chol Calc (NIH) 72 0 - 99 mg/dL  ? Chol/HDL Ratio 3.6 0.0 - 5.0 ratio  ?B12  ?Result Value Ref Range  ? Vitamin B-12 1,622 (H) 232 - 1,245 pg/mL  ?Vitamin D (25 hydroxy)  ?Result Value Ref Range  ? Vit D, 25-Hydroxy 33.6 30.0 - 100.0 ng/mL  ? ?   ?Assessment & Plan:  ? ?Problem List Items Addressed This Visit   ?None ?Visit Diagnoses   ? ? Acute cough    -  Primary  ? Relevant Orders  ? DG Chest 2 View  ? Viral bronchitis      ? Relevant Orders  ? Novel Coronavirus, NAA ([facility])  ? Acute conjunctivitis, unspecified acute conjunctivitis type, unspecified laterality      ? Will rx polytrim eye drops as will help with swelling and secondary infection.    ? ?  ?  ?Suspect viral as wife is sick as well.  Will do  ? ?Follow up plan: ?Return if symptoms worsen or fail to improve. ? ? ? ? ? ?

## 2021-07-27 LAB — NOVEL CORONAVIRUS, NAA: SARS-CoV-2, NAA: NOT DETECTED

## 2021-07-27 NOTE — Progress Notes (Signed)
Please let Carl Bryan know his Covid testing returned negative.

## 2021-07-28 ENCOUNTER — Ambulatory Visit
Admission: RE | Admit: 2021-07-28 | Discharge: 2021-07-28 | Disposition: A | Discharge status: Home / Self Care | Payer: Medicare PPO | Attending: Unknown Physician Specialty | Admitting: Unknown Physician Specialty

## 2021-07-28 ENCOUNTER — Ambulatory Visit
Admission: RE | Admit: 2021-07-28 | Discharge: 2021-07-28 | Disposition: A | Discharge status: Home / Self Care | Payer: Medicare PPO | Source: Ambulatory Visit | Attending: Unknown Physician Specialty | Admitting: Unknown Physician Specialty

## 2021-07-28 DIAGNOSIS — R051 Acute cough: Secondary | ICD-10-CM

## 2021-07-28 DIAGNOSIS — R059 Cough, unspecified: Secondary | ICD-10-CM | POA: Diagnosis not present

## 2021-07-29 ENCOUNTER — Telehealth: Payer: Self-pay | Admitting: Nurse Practitioner

## 2021-07-29 NOTE — Telephone Encounter (Signed)
Spoke with patient and notified him that his imaging results aren't received and reviewed by provider. Once they have been received somebody of the clinical staff will give you a call back.  ?

## 2021-07-29 NOTE — Telephone Encounter (Signed)
Imaging has not been read by Radiologist. ?

## 2021-07-29 NOTE — Telephone Encounter (Signed)
Pt is calling to receive the results from [facility] Chest 2 View  ? ?

## 2021-08-03 ENCOUNTER — Ambulatory Visit (INDEPENDENT_AMBULATORY_CARE_PROVIDER_SITE_OTHER): Payer: Medicare PPO | Admitting: *Deleted

## 2021-08-03 DIAGNOSIS — Z0000 Encounter for general adult medical examination without abnormal findings: Secondary | ICD-10-CM

## 2021-08-03 NOTE — Progress Notes (Signed)
? ?Subjective:  ? Carl Bryan is a 76 y.o. male who presents for Medicare Annual/Subsequent preventive examination. ? ?I connected with  Carl Bryan on 08/03/21 by a Tele-health enabled telemedicine application and verified that I am speaking with the correct person using two identifiers. ?  ?I discussed the limitations of evaluation and management by telemedicine. The patient expressed understanding and agreed to proceed. ? ?Patient location: home ? ?Provider location:  Tele-health not in office ? ? ? ?Review of Systems    ? ?Cardiac Risk Factors include: advanced age (>29mn, >>72women);diabetes mellitus;male gender;hypertension ? ?   ?Objective:  ?  ?Today's Vitals  ? 08/03/21 1035  ?PainSc: 0-No pain  ? ?There is no height or weight on file to calculate BMI. ? ? ?  08/03/2021  ? 10:40 AM 08/02/2020  ?  9:49 AM 03/06/2019  ?  1:46 AM 03/05/2019  ?  4:37 PM 11/20/2018  ?  3:41 PM 07/31/2018  ? 11:06 AM 07/30/2017  ?  9:55 AM  ?Advanced Directives  ?Does Patient Have a Medical Advance Directive? No Yes No No No No No  ?Type of Advance Directive  Living will       ?Would patient like information on creating a medical advance directive? No - Patient declined   No - Patient declined No - Patient declined No - Patient declined No - Patient declined  ? ? ?Current Medications (verified) ?Outpatient Encounter Medications as of 08/03/2021  ?Medication Sig  ? ACCU-CHEK AVIVA PLUS test strip Check 1 time a day  ? allopurinol (ZYLOPRIM) 300 MG tablet Take 1 tablet (300 mg total) by mouth daily.  ? amLODipine (NORVASC) 10 MG tablet Take 1 tablet (10 mg total) by mouth daily.  ? benzonatate (TESSALON) 200 MG capsule Take 1 capsule (200 mg total) by mouth 2 (two) times daily as needed for cough.  ? blood glucose meter kit and supplies KIT To check blood sugar twice a day. (FOR ICD-9 250.00, 250.01).  ? Blood Glucose Monitoring Suppl (ACCU-CHEK AVIVA PLUS) w/Device KIT Test 1 time daily  ? empagliflozin (JARDIANCE) 10 MG TABS tablet  Take 1 tablet (10 mg total) by mouth daily before breakfast.  ? glipiZIDE (GLUCOTROL) 5 MG tablet Take 1 tablet (5 mg total) by mouth daily before breakfast.  ? losartan (COZAAR) 100 MG tablet Take 1 tablet (100 mg total) by mouth daily.  ? meloxicam (MOBIC) 15 MG tablet Take 1 tablet (15 mg total) by mouth daily.  ? metFORMIN (GLUCOPHAGE) 500 MG tablet TAKE 2 TABLETS (1,000 MG TOTAL) BY MOUTH 2 (TWO) TIMES DAILY WITH A MEAL.  ? pantoprazole (PROTONIX) 40 MG tablet TAKE 1 TABLET 2 TIMES DAILY BEFORE A MEAL.  ? rosuvastatin (CRESTOR) 40 MG tablet Take 1 tablet (40 mg total) by mouth daily.  ? tamsulosin (FLOMAX) 0.4 MG CAPS capsule Take 1 capsule (0.4 mg total) by mouth daily.  ? trimethoprim-polymyxin b (POLYTRIM) ophthalmic solution Place 1 drop into both eyes every 4 (four) hours.  ? vitamin B-12 (CYANOCOBALAMIN) 1000 MCG tablet Take 1 tablet (1,000 mcg total) by mouth daily.  ? Vitamin D, Cholecalciferol, 25 MCG (1000 UT) TABS Take 2,000 Units by mouth daily.  ? ?No facility-administered encounter medications on file as of 08/03/2021.  ? ? ?Allergies (verified) ?Doxycycline calcium, Tussionex pennkinetic er [hydrocod poli-chlorphe poli er], and Vytorin [ezetimibe-simvastatin]  ? ?History: ?Past Medical History:  ?Diagnosis Date  ? Bleeding ulcer   ? Bronchospasm   ? Diabetes mellitus without complication (  [place])   ? Type 2  ? Diverticulosis   ? Pure hypercholesterolemia   ? Traumatic amputation of leg above knee ([place])   ? ?Past Surgical History:  ?Procedure Laterality Date  ? ABOVE KNEE LEG AMPUTATION  04/10/1969  ? APPENDECTOMY    ? KNEE SURGERY    ? x 2.  ? REPLACEMENT TOTAL KNEE Left   ? ?Family History  ?Problem Relation Age of Onset  ? Cancer Mother   ?     Colon cancer  ? Lung disease Father   ? ?Social History  ? ?Socioeconomic History  ? Marital status: Married  ?  Spouse name: Not on file  ? Number of children: Not on file  ? Years of education: Not on file  ? Highest education level: 6th grade   ?Occupational History  ? Occupation: Retired  ?Tobacco Use  ? Smoking status: Former  ?  Types: Cigarettes  ?  Quit date: 09/29/1964  ?  Years since quitting: 56.8  ? Smokeless tobacco: Never  ?Vaping Use  ? Vaping Use: Never used  ?Substance and Sexual Activity  ? Alcohol use: No  ?  Alcohol/week: 0.0 standard drinks  ? Drug use: No  ? Sexual activity: Not Currently  ?Other Topics Concern  ? Not on file  ?Social History Narrative  ? Not on file  ? ?Social Determinants of Health  ? ?Financial Resource Strain: Low Risk   ? Difficulty of Paying Living Expenses: Not hard at all  ?Food Insecurity: No Food Insecurity  ? Worried About Charity fundraiser in the Last Year: Never true  ? Ran Out of Food in the Last Year: Never true  ?Transportation Needs: No Transportation Needs  ? Lack of Transportation (Medical): No  ? Lack of Transportation (Non-Medical): No  ?Physical Activity: Insufficiently Active  ? Days of Exercise per Week: 2 days  ? Minutes of Exercise per Session: 20 min  ?Stress: No Stress Concern Present  ? Feeling of Stress : Not at all  ?Social Connections: Socially Integrated  ? Frequency of Communication with Friends and Family: More than three times a week  ? Frequency of Social Gatherings with Friends and Family: Three times a week  ? Attends Religious Services: More than 4 times per year  ? Active Member of Clubs or Organizations: Yes  ? Attends Archivist Meetings: More than 4 times per year  ? Marital Status: Married  ? ? ?Tobacco Counseling ?Counseling given: Not Answered ? ? ?Clinical Intake: ? ?Pre-visit preparation completed: Yes ? ?Pain : No/denies pain ?Pain Score: 0-No pain ? ?  ? ?Nutritional Risks: None ?Diabetes: Yes ?CBG done?: No ?Did pt. bring in CBG monitor from home?: No ? ?How often do you need to have someone help you when you read instructions, pamphlets, or other written materials from your doctor or pharmacy?: 2 - Rarely ? ?Diabetic?  Yes ? ?Nutrition Risk  Assessment: ? ?Has the patient had any N/V/D within the last 2 months?  Yes  ?Does the patient have any non-healing wounds?  No  ?Has the patient had any unintentional weight loss or weight gain?  No  ? ?Diabetes: ? ?Is the patient diabetic?  Yes  ?If diabetic, was a CBG obtained today?  No  ?Did the patient bring in their glucometer from home?  No  ?How often do you monitor your CBG's? 1 x a day.  ? ?Financial Strains and Diabetes Management: ? ?Are you having any financial strains with  the device, your supplies or your medication? No .  ?Does the patient want to be seen by Chronic Care Management for management of their diabetes?  No  ?Would the patient like to be referred to a Nutritionist or for Diabetic Management?  No  ? ?Diabetic Exams: ? ?Diabetic Eye Exam:Pt has been advised about the importance in completing this exam. ? ?Diabetic Foot Exam: Pt has been advised about the importance in completing this exam. ? ?Interpreter Needed?: No ? ?Information entered by :: Leroy Kennedy LPN ? ? ?Activities of Daily Living ? ?  08/03/2021  ? 10:42 AM  ?In your present state of health, do you have any difficulty performing the following activities:  ?Hearing? 1  ?Vision? 0  ?Difficulty concentrating or making decisions? 0  ?Walking or climbing stairs? 1  ?Dressing or bathing? 0  ?Doing errands, shopping? 0  ?Preparing Food and eating ? N  ?Using the Toilet? N  ?In the past six months, have you accidently leaked urine? N  ?Do you have problems with loss of bowel control? N  ?Managing your Medications? N  ?Managing your Finances? N  ?Housekeeping or managing your Housekeeping? N  ? ? ?Patient Care Team: ?Jon Billings, NP as PCP - General ?Kate Sable, MD as PCP - Cardiology (Cardiology) ?Vanita Ingles, RN as Case Manager (General Practice) ?Vladimir Faster, [place] (Inactive) (Pharmacist) ? ?Indicate any recent Medical Services you may have received from other than [facility] providers in the past year (date may be  approximate). ? ?   ?Assessment:  ? This is a routine wellness examination for Griffin. ? ?Hearing/Vision screen ?Hearing Screening - Comments:: R ear no hearing ?Hearing aids ?Vision Screening - Comments:: My eye doctor ?Up to date ? ?[place]

## 2021-08-03 NOTE — Patient Instructions (Signed)
Carl Bryan , ?Thank you for taking time to come for your Medicare Wellness Visit. I appreciate your ongoing commitment to your health goals. Please review the following plan we discussed and let me know if I can assist you in the future.  ? ?Screening recommendations/referrals: ?Colonoscopy: no longer required ?Recommended yearly ophthalmology/optometry visit for glaucoma screening and checkup ?Recommended yearly dental visit for hygiene and checkup ? ?Vaccinations: ?Influenza vaccine: up to date ?Pneumococcal vaccine: up to date ?Tdap vaccine: up to date ?Shingles vaccine: Education provided   ? ?Advanced directives: Education provided ? ?Conditions/risks identified:  ? ?Next appointment: 08-19-2021 @ 10:20  [facility] ? ?Preventive Care 80 Years and Older, Male ?Preventive care refers to lifestyle choices and visits with your health care provider that can promote health and wellness. ?What does preventive care include? ?A yearly physical exam. This is also called an annual well check. ?Dental exams once or twice a year. ?Routine eye exams. Ask your health care provider how often you should have your eyes checked. ?Personal lifestyle choices, including: ?Daily care of your teeth and gums. ?Regular physical activity. ?Eating a healthy diet. ?Avoiding tobacco and drug use. ?Limiting alcohol use. ?Practicing safe sex. ?Taking low doses of aspirin every day. ?Taking vitamin and mineral supplements as recommended by your health care provider. ?What happens during an annual well check? ?The services and screenings done by your health care provider during your annual well check will depend on your age, overall health, lifestyle risk factors, and family history of disease. ?Counseling  ?Your health care provider may ask you questions about your: ?Alcohol use. ?Tobacco use. ?Drug use. ?Emotional well-being. ?Home and relationship well-being. ?Sexual activity. ?Eating habits. ?History of falls. ?Memory and ability to understand  (cognition). ?Work and work Statistician. ?Screening  ?You may have the following tests or measurements: ?Height, weight, and BMI. ?Blood pressure. ?Lipid and cholesterol levels. These may be checked every 5 years, or more frequently if you are over 41 years old. ?Skin check. ?Lung cancer screening. You may have this screening every year starting at age 51 if you have a 30-pack-year history of smoking and currently smoke or have quit within the past 15 years. ?Fecal occult blood test (FOBT) of the stool. You may have this test every year starting at age 68. ?Flexible sigmoidoscopy or colonoscopy. You may have a sigmoidoscopy every 5 years or a colonoscopy every 10 years starting at age 70. ?Prostate cancer screening. Recommendations will vary depending on your family history and other risks. ?Hepatitis C blood test. ?Hepatitis B blood test. ?Sexually transmitted disease (STD) testing. ?Diabetes screening. This is done by checking your blood sugar (glucose) after you have not eaten for a while (fasting). You may have this done every 1-3 years. ?Abdominal aortic aneurysm (AAA) screening. You may need this if you are a current or former smoker. ?Osteoporosis. You may be screened starting at age 73 if you are at high risk. ?Talk with your health care provider about your test results, treatment options, and if necessary, the need for more tests. ?Vaccines  ?Your health care provider may recommend certain vaccines, such as: ?Influenza vaccine. This is recommended every year. ?Tetanus, diphtheria, and acellular pertussis (Tdap, Td) vaccine. You may need a Td booster every 10 years. ?Zoster vaccine. You may need this after age 74. ?Pneumococcal 13-valent conjugate (PCV13) vaccine. One dose is recommended after age 20. ?Pneumococcal polysaccharide (PPSV23) vaccine. One dose is recommended after age 39. ?Talk to your health care provider about which screenings and  vaccines you need and how often you need them. ?This  information is not intended to replace advice given to you by your health care provider. Make sure you discuss any questions you have with your health care provider. ?Document Released: 04/23/2015 Document Revised: 12/15/2015 Document Reviewed: 01/26/2015 ?Elsevier Interactive Patient Education ? 2017 [place]. ? ?Fall Prevention in the Home ?Falls can cause injuries. They can happen to people of all ages. There are many things you can do to make your home safe and to help prevent falls. ?What can I do on the outside of my home? ?Regularly fix the edges of walkways and driveways and fix any cracks. ?Remove anything that might make you trip as you walk through a door, such as a raised step or threshold. ?Trim any bushes or trees on the path to your home. ?Use bright outdoor lighting. ?Clear any walking paths of anything that might make someone trip, such as rocks or tools. ?Regularly check to see if handrails are loose or broken. Make sure that both sides of any steps have handrails. ?Any raised decks and porches should have guardrails on the edges. ?Have any leaves, snow, or ice cleared regularly. ?Use sand or salt on walking paths during winter. ?Clean up any spills in your garage right away. This includes oil or grease spills. ?What can I do in the bathroom? ?Use night lights. ?Install grab bars by the toilet and in the tub and shower. Do not use towel bars as grab bars. ?Use non-skid mats or decals in the tub or shower. ?If you need to sit down in the shower, use a plastic, non-slip stool. ?Keep the floor dry. Clean up any water that spills on the floor as soon as it happens. ?Remove soap buildup in the tub or shower regularly. ?Attach bath mats securely with double-sided non-slip rug tape. ?Do not have throw rugs and other things on the floor that can make you trip. ?What can I do in the bedroom? ?Use night lights. ?Make sure that you have a light by your bed that is easy to reach. ?Do not use any sheets or  blankets that are too big for your bed. They should not hang down onto the floor. ?Have a firm chair that has side arms. You can use this for support while you get dressed. ?Do not have throw rugs and other things on the floor that can make you trip. ?What can I do in the kitchen? ?Clean up any spills right away. ?Avoid walking on wet floors. ?Keep items that you use a lot in easy-to-reach places. ?If you need to reach something above you, use a strong step stool that has a grab bar. ?Keep electrical cords out of the way. ?Do not use floor polish or wax that makes floors slippery. If you must use wax, use non-skid floor wax. ?Do not have throw rugs and other things on the floor that can make you trip. ?What can I do with my stairs? ?Do not leave any items on the stairs. ?Make sure that there are handrails on both sides of the stairs and use them. Fix handrails that are broken or loose. Make sure that handrails are as long as the stairways. ?Check any carpeting to make sure that it is firmly attached to the stairs. Fix any carpet that is loose or worn. ?Avoid having throw rugs at the top or bottom of the stairs. If you do have throw rugs, attach them to the floor with carpet tape. ?Make  sure that you have a light switch at the top of the stairs and the bottom of the stairs. If you do not have them, ask someone to add them for you. ?What else can I do to help prevent falls? ?Wear shoes that: ?Do not have high heels. ?Have rubber bottoms. ?Are comfortable and fit you well. ?Are closed at the toe. Do not wear sandals. ?If you use a stepladder: ?Make sure that it is fully opened. Do not climb a closed stepladder. ?Make sure that both sides of the stepladder are locked into place. ?Ask someone to hold it for you, if possible. ?Clearly mark and make sure that you can see: ?Any grab bars or handrails. ?First and last steps. ?Where the edge of each step is. ?Use tools that help you move around (mobility aids) if they are  needed. These include: ?Canes. ?Walkers. ?Scooters. ?Crutches. ?Turn on the lights when you go into a dark area. Replace any light bulbs as soon as they burn out. ?Set up your furniture so you have a clear path.

## 2021-08-05 ENCOUNTER — Telehealth: Payer: Self-pay | Admitting: Nurse Practitioner

## 2021-08-05 ENCOUNTER — Ambulatory Visit: Payer: Medicare Other

## 2021-08-05 NOTE — Telephone Encounter (Signed)
Copied from CRM [id number]. Topic: General - Other ?>> Aug 04, 2021  4:25 PM Marylen Ponto wrote: ?Reason for CRM: Lafonda Mosses with [facility] requests that question #4 on the work order be answered and please attach most recent office notes. Cb# [phone] ?

## 2021-08-05 NOTE — Telephone Encounter (Signed)
Form completed and placed in providers folder for signature.  ?

## 2021-08-08 NOTE — Telephone Encounter (Signed)
Form signed.

## 2021-08-12 ENCOUNTER — Other Ambulatory Visit: Payer: Self-pay

## 2021-08-12 MED ORDER — PANTOPRAZOLE SODIUM 40 MG PO TBEC: DELAYED_RELEASE_TABLET | ORAL | 1 refills | Status: DC

## 2021-08-15 ENCOUNTER — Ambulatory Visit: Payer: Self-pay | Admitting: *Deleted

## 2021-08-15 NOTE — Telephone Encounter (Signed)
3rd attempt to return call without success.   Forwarded to [facility] per our policy after 3 attempts. ?

## 2021-08-15 NOTE — Telephone Encounter (Signed)
I returned pt's wife's call.   She co him still coughing after being seen on 07/26/2021. ?The number [phone] keeps giving me a fast busy signal 2 attempts. ? ?Tried the other number listed in the chart [phone].   It rang and rang.   ? ? ? ?

## 2021-08-15 NOTE — Telephone Encounter (Signed)
Attempted to call pt and each time got a recording stating "could not complete to number as dialed" called a third time and heard rapid busy signal. ?

## 2021-08-18 NOTE — Progress Notes (Signed)
? ?BP (!) 145/68   Pulse (!) 58   Temp 97.9 ?F (36.6 ?C) (Oral)   SpO2 92%   ? ?Subjective:  ? ? Patient ID: Carl Bryan, male    DOB: 1945/09/13, 76 y.o.   MRN: [id number] ? ?HPI: ?Carl Bryan is a 76 y.o. male ? ?Chief Complaint  ?Patient presents with  ? Hypertension  ? Hyperlipidemia  ?  Follow up   ? Diabetes  ? ?HYPERTENSION / HYPERLIPIDEMIA ?Satisfied with current treatment? yes ?Duration of hypertension: years ?BP monitoring frequency: sometimes ?BP range: not sure what they ?BP medication side effects: no ?Past BP meds: amlodipine and losartan (cozaar) ?Duration of hyperlipidemia: years ?Cholesterol medication side effects: no ?Cholesterol supplements: none ?Past cholesterol medications: rosuvastatin (crestor) ?Medication compliance: excellent compliance ?Aspirin: no ?Recent stressors: no ?Recurrent headaches: no ?Visual changes: no ?Palpitations: no ?Dyspnea: no ?Chest pain: no ?Lower extremity edema: no ?Dizzy/lightheaded: no ? ?DIABETES ?Patient states he is no longer having problems with diarrhea.  He is only taking Metformin 1 in the morning and 1 at night.  ?Hypoglycemic episodes:no ?Polydipsia/polyuria: no ?Visual disturbance: no ?Chest pain: no ?Paresthesias: no ?Glucose Monitoring: yes ? Accucheck frequency: Daily ? Fasting glucose: 138 today ? Post prandial: ? Evening: ? Before meals: ?Taking Insulin?: no ? Long acting insulin: ? Short acting insulin: ?Blood Pressure Monitoring: daily ?Retinal Examination: Up to Date- will get report ?Foot Exam: Not up to Date ?Diabetic Education: Not Completed ?Pneumovax: Up to Date ?Influenza: Up to Date ?Aspirin: no ? ?Relevant past medical, surgical, family and social history reviewed and updated as indicated. Interim medical history since our last visit reviewed. ?Allergies and medications reviewed and updated. ? ?Review of Systems  ?Eyes:  Negative for visual disturbance.  ?Respiratory:  Negative for chest tightness and shortness of breath.    ?Cardiovascular:  Negative for chest pain, palpitations and leg swelling.  ?Gastrointestinal:  Positive for diarrhea.  ?Endocrine: Negative for polydipsia and polyuria.  ?Neurological:  Negative for dizziness, light-headedness, numbness and headaches.  ? ?Per HPI unless specifically indicated above ? ?   ?Objective:  ?  ?BP (!) 145/68   Pulse (!) 58   Temp 97.9 ?F (36.6 ?C) (Oral)   SpO2 92%   ?Wt Readings from Last 3 Encounters:  ?07/26/21 156 lb (70.8 kg)  ?03/08/21 156 lb 8 oz (71 kg)  ?11/11/20 156 lb 3.2 oz (70.9 kg)  ?  ?Physical Exam ?Vitals and nursing note reviewed.  ?Constitutional:   ?   General: He is not in acute distress. ?   Appearance: Normal appearance. He is not ill-appearing, toxic-appearing or diaphoretic.  ?HENT:  ?   Head: Normocephalic.  ?   Right Ear: External ear normal.  ?   Left Ear: External ear normal.  ?   Nose: Nose normal. No congestion or rhinorrhea.  ?   Mouth/Throat:  ?   Mouth: Mucous membranes are moist.  ?Eyes:  ?   General:     ?   Right eye: No discharge.     ?   Left eye: No discharge.  ?   Extraocular Movements: Extraocular movements intact.  ?   Conjunctiva/sclera: Conjunctivae normal.  ?   Pupils: Pupils are equal, round, and reactive to light.  ?Cardiovascular:  ?   Rate and Rhythm: Normal rate and regular rhythm.  ?   Heart sounds: No murmur heard. ?Pulmonary:  ?   Effort: Pulmonary effort is normal. No respiratory distress.  ?   Breath sounds:  Normal breath sounds. No wheezing, rhonchi or rales.  ?Abdominal:  ?   General: Abdomen is flat. Bowel sounds are normal.  ?Musculoskeletal:  ?   Cervical back: Normal range of motion and neck supple.  ?   Comments: Uses crutches due to L AKA.  ?Skin: ?   General: Skin is warm and dry.  ?   Capillary Refill: Capillary refill takes less than 2 seconds.  ?Neurological:  ?   General: No focal deficit present.  ?   Mental Status: He is alert and oriented to person, place, and time.  ?Psychiatric:     ?   Mood and Affect: Mood  normal.     ?   Behavior: Behavior normal.     ?   Thought Content: Thought content normal.     ?   Judgment: Judgment normal.  ? ? ?Results for orders placed or performed in visit on 07/26/21  ?Novel Coronavirus, NAA ([facility])  ? Specimen: Nasopharyngeal(NP) swabs in vial transport medium  ?Result Value Ref Range  ? SARS-CoV-2, NAA Not Detected Not Detected  ? ?   ?Assessment & Plan:  ? ?Problem List Items Addressed This Visit   ? ?  ? Cardiovascular and Mediastinum  ? Hypertension associated with diabetes ([place]) - Primary  ?  Chronic.  Controlled.   Continue with current medication regimen.  Labs ordered today.  Return to clinic in 3 months for reevaluation.  Call sooner if concerns arise.  ? ? ?  ?  ? Relevant Medications  ? empagliflozin (JARDIANCE) 10 MG TABS tablet  ? Other Relevant Orders  ? Comp Met (CMET)  ? Aortic atherosclerosis ([place])  ?  Chronic.  Under good control on current regimen. Continue current regimen of Crestor 83m. Continue to monitor. Call with any concerns. Labs drawn today.  Follow up in 3 months.  Call sooner if concerns arise. ? ?  ?  ?  ? Endocrine  ? Hyperlipidemia associated with type 2 diabetes mellitus ([place]  ?  Chronic.  Controlled.  Continue with current medication regimen on Crestor 476m  Labs ordered today.  Return to clinic in 3 months for reevaluation.  Call sooner if concerns arise.  ? ? ?  ?  ? Relevant Medications  ? empagliflozin (JARDIANCE) 10 MG TABS tablet  ? Other Relevant Orders  ? Lipid Profile  ? Diabetes mellitus associated with hormonal etiology ([place] ?  Chronic.  Last A1c was 6.5.  Having problems with diarrhea due to the Metformin.  Will check labs at visit today.  Taking Metformin 50044mID and not having diarrhea.  If A1c is greater than 8 will add Ozempic. Patient not taking Jardiance because pharmacy didn't send it.  Will restart today.  Can consider discontinuing Glipizide for risk of hypoglycemia.  Continue with current medication regimen.  Labs ordered  today.  Return to clinic in 3 months for reevaluation.  Call sooner if concerns arise.  ? ? ?  ?  ? Relevant Medications  ? empagliflozin (JARDIANCE) 10 MG TABS tablet  ? Other Relevant Orders  ? HgB A1c  ?  ? Other  ? Vitamin D deficiency  ?  Labs ordered today. Will make recommendations based on lab results.  ? ?  ?  ? Relevant Orders  ? Vitamin D (25 hydroxy)  ? B12 deficiency  ?  Labs ordered today. Will make recommendations based on lab results.  ? ?  ?  ? Relevant Orders  ? B12  ?  ? ?  Follow up plan: ?Return in about 3 months (around 11/19/2021) for HTN, HLD, DM2 FU. ? ? ? ? ? ? ?

## 2021-08-19 ENCOUNTER — Encounter: Payer: Self-pay | Admitting: Nurse Practitioner

## 2021-08-19 ENCOUNTER — Ambulatory Visit (INDEPENDENT_AMBULATORY_CARE_PROVIDER_SITE_OTHER): Payer: Medicare PPO | Admitting: Nurse Practitioner

## 2021-08-19 VITALS — BP 145/68 | HR 58 | Temp 97.9°F

## 2021-08-19 DIAGNOSIS — E538 Deficiency of other specified B group vitamins: Secondary | ICD-10-CM

## 2021-08-19 DIAGNOSIS — I152 Hypertension secondary to endocrine disorders: Secondary | ICD-10-CM | POA: Diagnosis not present

## 2021-08-19 DIAGNOSIS — E785 Hyperlipidemia, unspecified: Secondary | ICD-10-CM | POA: Diagnosis not present

## 2021-08-19 DIAGNOSIS — E1169 Type 2 diabetes mellitus with other specified complication: Secondary | ICD-10-CM

## 2021-08-19 DIAGNOSIS — I700 Atherosclerosis of aorta: Secondary | ICD-10-CM | POA: Diagnosis not present

## 2021-08-19 DIAGNOSIS — E559 Vitamin D deficiency, unspecified: Secondary | ICD-10-CM

## 2021-08-19 DIAGNOSIS — S78111D Complete traumatic amputation at level between right hip and knee, subsequent encounter: Secondary | ICD-10-CM

## 2021-08-19 DIAGNOSIS — E1159 Type 2 diabetes mellitus with other circulatory complications: Secondary | ICD-10-CM | POA: Diagnosis not present

## 2021-08-19 MED ORDER — PANTOPRAZOLE SODIUM 40 MG PO TBEC
40.0000 mg | DELAYED_RELEASE_TABLET | Freq: Every day | ORAL | 1 refills | Status: DC
Start: 2021-08-19 — End: 2021-08-22

## 2021-08-19 MED ORDER — EMPAGLIFLOZIN 10 MG PO TABS
10.0000 mg | ORAL_TABLET | Freq: Every day | ORAL | 1 refills | Status: DC
Start: 2021-08-19 — End: 2021-12-07

## 2021-08-19 NOTE — Assessment & Plan Note (Signed)
Chronic.  Controlled.  Continue with current medication regimen on Crestor 40mg.  Labs ordered today.  Return to clinic in 3 months for reevaluation.  Call sooner if concerns arise.   

## 2021-08-19 NOTE — Assessment & Plan Note (Signed)
Chronic.  Last A1c was 6.5.  Having problems with diarrhea due to the Metformin.  Will check labs at visit today.  Taking Metformin 500mg  BID and not having diarrhea.  If A1c is greater than 8 will add Ozempic. Patient not taking Jardiance because pharmacy didn't send it.  Will restart today.  Can consider discontinuing Glipizide for risk of hypoglycemia.  Continue with current medication regimen.  Labs ordered today.  Return to clinic in 3 months for reevaluation.  Call sooner if concerns arise.  ? ?

## 2021-08-19 NOTE — Assessment & Plan Note (Signed)
Chronic.  Under good control on current regimen. Continue current regimen of Crestor 40mg . Continue to monitor. Call with any concerns. Labs drawn today.  Follow up in 3 months.  Call sooner if concerns arise. ?

## 2021-08-19 NOTE — Assessment & Plan Note (Signed)
Labs ordered today.  Will make recommendations based on lab results. ?

## 2021-08-19 NOTE — Assessment & Plan Note (Signed)
Chronic.  Controlled.  Continue with current medication regimen.  Labs ordered today.  Return to clinic in 3 months for reevaluation.  Call sooner if concerns arise.   

## 2021-08-20 LAB — COMPREHENSIVE METABOLIC PANEL
ALT: 18 IU/L (ref 0–44)
AST: 14 IU/L (ref 0–40)
Albumin/Globulin Ratio: 1.5 (ref 1.2–2.2)
Albumin: 4.3 g/dL (ref 3.7–4.7)
Alkaline Phosphatase: 80 IU/L (ref 44–121)
BUN/Creatinine Ratio: 17 (ref 10–24)
BUN: 17 mg/dL (ref 8–27)
Bilirubin Total: 0.5 mg/dL (ref 0.0–1.2)
CO2: 20 mmol/L (ref 20–29)
Calcium: 10.1 mg/dL (ref 8.6–10.2)
Chloride: 103 mmol/L (ref 96–106)
Creatinine, Ser: 1.01 mg/dL (ref 0.76–1.27)
Globulin, Total: 2.8 g/dL (ref 1.5–4.5)
Glucose: 153 mg/dL — ABNORMAL HIGH (ref 70–99)
Potassium: 4.8 mmol/L (ref 3.5–5.2)
Sodium: 137 mmol/L (ref 134–144)
Total Protein: 7.1 g/dL (ref 6.0–8.5)
eGFR: 77 mL/min/{1.73_m2} (ref >59)

## 2021-08-20 LAB — LIPID PANEL
Chol/HDL Ratio: 3.3 ratio (ref 0.0–5.0)
Cholesterol, Total: 130 mg/dL (ref 100–199)
HDL: 39 mg/dL — ABNORMAL LOW (ref >39)
LDL Chol Calc (NIH): 70 mg/dL (ref 0–99)
Triglycerides: 114 mg/dL (ref 0–149)
VLDL Cholesterol Cal: 21 mg/dL (ref 5–40)

## 2021-08-20 LAB — VITAMIN B12: Vitamin B-12: 1884 pg/mL — ABNORMAL HIGH (ref 232–1245)

## 2021-08-20 LAB — HEMOGLOBIN A1C
Est. average glucose Bld gHb Est-mCnc: 151 mg/dL
Hgb A1c MFr Bld: 6.9 % — ABNORMAL HIGH (ref 4.8–5.6)

## 2021-08-20 LAB — VITAMIN D 25 HYDROXY (VIT D DEFICIENCY, FRACTURES): Vit D, 25-Hydroxy: 28.9 ng/mL — ABNORMAL LOW (ref 30.0–100.0)

## 2021-08-22 MED ORDER — PANTOPRAZOLE SODIUM 40 MG PO TBEC: 40.0000 mg | DELAYED_RELEASE_TABLET | Freq: Every day | ORAL | 1 refills | Status: DC

## 2021-08-22 NOTE — Progress Notes (Signed)
Please let patient know that his lab work shows that his A1c increased to 6.9.  I would like him to restart the Jardiance as discussed during the visit.  His Vitamin D is low.  Continue with the vitamin D 2,000 IU daily.  Otherwise, his blood work looks good.  No other concerns at this time.

## 2021-08-22 NOTE — Addendum Note (Signed)
Addended by: Larae Grooms on: 08/22/2021 08:43 AM ? ? Modules accepted: Orders ? ?

## 2021-10-19 ENCOUNTER — Other Ambulatory Visit: Payer: Self-pay | Admitting: Nurse Practitioner

## 2021-10-19 DIAGNOSIS — E1169 Type 2 diabetes mellitus with other specified complication: Secondary | ICD-10-CM

## 2021-10-19 NOTE — Telephone Encounter (Signed)
Requested Prescriptions  Pending Prescriptions Disp Refills  . metFORMIN (GLUCOPHAGE) 500 MG tablet [Pharmacy Med Name: METFORMIN HYDROCHLORIDE 500 MG Tablet] 360 tablet 0    Sig: TAKE 2 TABLETS TWICE DAILY WITH MEALS     Endocrinology:  Diabetes - Biguanides Failed - 10/19/2021  4:12 AM      Failed - B12 Level in normal range and within 720 days    Vitamin B-12  Date Value Ref Range Status  08/19/2021 1,884 (H) 232 - 1,245 pg/mL Final         Failed - CBC within normal limits and completed in the last 12 months    WBC  Date Value Ref Range Status  09/09/2020 8.8 3.4 - 10.8 x10E3/uL Final  03/10/2019 6.4 4.0 - 10.5 K/uL Final   RBC  Date Value Ref Range Status  09/09/2020 4.94 4.14 - 5.80 x10E6/uL Final  03/10/2019 4.14 (L) 4.22 - 5.81 MIL/uL Final   Hemoglobin  Date Value Ref Range Status  09/09/2020 15.6 13.0 - 17.7 g/dL Final   Hematocrit  Date Value Ref Range Status  09/09/2020 44.8 37.5 - 51.0 % Final   MCHC  Date Value Ref Range Status  09/09/2020 34.8 31.5 - 35.7 g/dL Final  03/10/2019 34.6 30.0 - 36.0 g/dL Final   [facility]  Date Value Ref Range Status  09/09/2020 31.6 26.6 - 33.0 pg Final  03/10/2019 31.4 26.0 - 34.0 pg Final   MCV  Date Value Ref Range Status  09/09/2020 91 79 - 97 fL Final   No results found for: "PLTCOUNTKUC", "LABPLAT", "POCPLA" RDW  Date Value Ref Range Status  09/09/2020 13.2 11.6 - 15.4 % Final         Passed - Cr in normal range and within 360 days    Creatinine, Ser  Date Value Ref Range Status  08/19/2021 1.01 0.76 - 1.27 mg/dL Final   Creatinine, Urine  Date Value Ref Range Status  03/05/2019 82 mg/dL Final    Comment:    Performed at [facility], [address] [place], [place] [address]         Passed - HBA1C is between 0 and 7.9 and within 180 days    HB A1C (BAYER DCA - WAIVED)  Date Value Ref Range Status  07/28/2020 7.5 (H) <7.0 % Final    Comment:                                          Diabetic  Adult            <7.0                                       Healthy Adult        4.3 - 5.7                                                           (DCCT/NGSP) American Diabetes Association's Summary of Glycemic Recommendations for Adults with Diabetes: Hemoglobin A1c <7.0%. More stringent glycemic goals (A1c <6.0%) may further reduce complications at the cost of increased risk of hypoglycemia.  Hgb A1c MFr Bld  Date Value Ref Range Status  08/19/2021 6.9 (H) 4.8 - 5.6 % Final    Comment:             Prediabetes: 5.7 - 6.4          Diabetes: >6.4          Glycemic control for adults with diabetes: <7.0          Passed - eGFR in normal range and within 360 days    GFR calc Af Amer  Date Value Ref Range Status  01/15/2020 89 >59 mL/min/1.73 Final    Comment:    **[facility] currently reports eGFR in compliance with the current**   recommendations of the [employer]. [facility] will   update reporting as new guidelines are published from the NKF-ASN   Task force.    GFR calc non Af Amer  Date Value Ref Range Status  01/15/2020 77 >59 mL/min/1.73 Final   eGFR  Date Value Ref Range Status  08/19/2021 77 >59 mL/min/1.73 Final         Passed - Valid encounter within last 6 months    Recent Outpatient Visits          2 months ago Hypertension associated with diabetes ([place])   [facility] Jon Billings, NP   2 months ago Acute cough   [facility] Kathrine Haddock, NP   7 months ago Hypertension associated with diabetes [facility])   [facility] Jon Billings, NP   11 months ago Diabetes mellitus associated with hormonal etiology ([place])   [place] Mc[place]e, Lauren A, NP   1 year ago Pre-op evaluation   [place] Jon Billings, NP      Future Appointments            In 1 month Jon Billings, NP [facility], [place]

## 2021-10-26 ENCOUNTER — Other Ambulatory Visit: Payer: Self-pay | Admitting: Nurse Practitioner

## 2021-10-26 DIAGNOSIS — E1169 Type 2 diabetes mellitus with other specified complication: Secondary | ICD-10-CM

## 2021-10-27 NOTE — Telephone Encounter (Signed)
Requested Prescriptions  Pending Prescriptions Disp Refills  . allopurinol (ZYLOPRIM) 300 MG tablet [Pharmacy Med Name: ALLOPURINOL 300 MG Tablet] 90 tablet 1    Sig: TAKE 1 TABLET (300 MG TOTAL) BY MOUTH DAILY.     Endocrinology:  Gout Agents - allopurinol Failed - 10/26/2021  3:51 AM      Failed - Uric Acid in normal range and within 360 days    Uric Acid  Date Value Ref Range Status  07/28/2020 2.4 (L) 3.8 - 8.4 mg/dL Final    Comment:               Therapeutic target for gout patients: <6.0         Failed - CBC within normal limits and completed in the last 12 months    WBC  Date Value Ref Range Status  09/09/2020 8.8 3.4 - 10.8 x10E3/uL Final  03/10/2019 6.4 4.0 - 10.5 K/uL Final   RBC  Date Value Ref Range Status  09/09/2020 4.94 4.14 - 5.80 x10E6/uL Final  03/10/2019 4.14 (L) 4.22 - 5.81 MIL/uL Final   Hemoglobin  Date Value Ref Range Status  09/09/2020 15.6 13.0 - 17.7 g/dL Final   Hematocrit  Date Value Ref Range Status  09/09/2020 44.8 37.5 - 51.0 % Final   MCHC  Date Value Ref Range Status  09/09/2020 34.8 31.5 - 35.7 g/dL Final  03/10/2019 34.6 30.0 - 36.0 g/dL Final   [facility]  Date Value Ref Range Status  09/09/2020 31.6 26.6 - 33.0 pg Final  03/10/2019 31.4 26.0 - 34.0 pg Final   MCV  Date Value Ref Range Status  09/09/2020 91 79 - 97 fL Final   No results found for: "PLTCOUNTKUC", "LABPLAT", "POCPLA" RDW  Date Value Ref Range Status  09/09/2020 13.2 11.6 - 15.4 % Final         Passed - Cr in normal range and within 360 days    Creatinine, Ser  Date Value Ref Range Status  08/19/2021 1.01 0.76 - 1.27 mg/dL Final   Creatinine, Urine  Date Value Ref Range Status  03/05/2019 82 mg/dL Final    Comment:    Performed at [facility], [address] [place], [place] [address]         Passed - Valid encounter within last 12 months    Recent Outpatient Visits          2 months ago Hypertension associated with diabetes ([place])   [facility] Jon Billings, NP   3 months ago Acute cough   [facility] Kathrine Haddock, NP   7 months ago Hypertension associated with diabetes ([place])   [facility] Jon Billings, NP   11 months ago Diabetes mellitus associated with hormonal etiology ([place])   [place], Lauren A, NP   1 year ago Pre-op evaluation   [place], Karen, NP      Future Appointments            In 3 weeks Jon Billings, NP [facility], [facility]           . meloxicam (MOBIC) 15 MG tablet [Pharmacy Med Name: MELOXICAM 15 MG Tablet] 90 tablet 1    Sig: TAKE 1 TABLET EVERY DAY     Analgesics:  COX2 Inhibitors Failed - 10/26/2021  3:51 AM      Failed - Manual Review: Labs are only required if the patient has taken medication for more than 8 weeks.  Failed - HGB in normal range and within 360 days    Hemoglobin  Date Value Ref Range Status  09/09/2020 15.6 13.0 - 17.7 g/dL Final         Failed - HCT in normal range and within 360 days    Hematocrit  Date Value Ref Range Status  09/09/2020 44.8 37.5 - 51.0 % Final         Passed - Cr in normal range and within 360 days    Creatinine, Ser  Date Value Ref Range Status  08/19/2021 1.01 0.76 - 1.27 mg/dL Final   Creatinine, Urine  Date Value Ref Range Status  03/05/2019 82 mg/dL Final    Comment:    Performed at [facility], [address] [place], [place] [address]         Passed - AST in normal range and within 360 days    AST  Date Value Ref Range Status  08/19/2021 14 0 - 40 IU/L Final   AST (SGOT) Piccolo, Waived  Date Value Ref Range Status  03/28/2018 28 11 - 38 U/L Final         Passed - ALT in normal range and within 360 days    ALT  Date Value Ref Range Status  08/19/2021 18 0 - 44 IU/L Final   ALT (SGPT) Piccolo, Waived  Date Value Ref Range Status  03/28/2018 35 10 - 47 U/L Final         Passed - eGFR is 30 or  above and within 360 days    GFR calc Af Amer  Date Value Ref Range Status  01/15/2020 89 >59 mL/min/1.73 Final    Comment:    **[facility] currently reports eGFR in compliance with the current**   recommendations of the [employer]. [facility] will   update reporting as new guidelines are published from the NKF-ASN   Task force.    GFR calc non Af Amer  Date Value Ref Range Status  01/15/2020 77 >59 mL/min/1.73 Final   eGFR  Date Value Ref Range Status  08/19/2021 77 >59 mL/min/1.73 Final         Passed - Patient is not pregnant      Passed - Valid encounter within last 12 months    Recent Outpatient Visits          2 months ago Hypertension associated with diabetes ([place])   [facility] Jon Billings, NP   3 months ago Acute cough   [facility] Kathrine Haddock, NP   7 months ago Hypertension associated with diabetes [facility])   [facility] Jon Billings, NP   11 months ago Diabetes mellitus associated with hormonal etiology ([place])   [place] Mc[place]we[place], Lauren A, NP   1 year ago Pre-op evaluation   [place] Jon Billings, NP      Future Appointments            In 3 weeks Jon Billings, NP [facility] [place]           . rosuvastatin (CRESTOR) 40 MG tablet [Pharmacy Med Name: ROSUVASTATIN CALCIUM 40 MG Tablet] 90 tablet 1    Sig: TAKE 1 TABLET (40 MG TOTAL) BY MOUTH DAILY.     Cardiovascular:  Antilipid - Statins 2 Failed - 10/26/2021  3:51 AM      Failed - Lipid Panel in normal range within the last 12 months    Cholesterol, Total  Date Value Ref  Range Status  08/19/2021 130 100 - 199 mg/dL Final   Cholesterol Piccolo, Waived  Date Value Ref Range Status  03/28/2018 122 <200 mg/dL Final    Comment:                            Desirable                <200                         Borderline High      200- 239                         High                     >239     LDL Chol Calc (NIH)  Date Value Ref Range Status  08/19/2021 70 0 - 99 mg/dL Final   HDL  Date Value Ref Range Status  08/19/2021 39 (L) >39 mg/dL Final   Triglycerides  Date Value Ref Range Status  08/19/2021 114 0 - 149 mg/dL Final   Triglycerides Piccolo,Waived  Date Value Ref Range Status  03/28/2018 232 (H) <150 mg/dL Final    Comment:                            Normal                   <150                         Borderline High     150 - 199                         High                200 - 499                         Very High                >499          Passed - Cr in normal range and within 360 days    Creatinine, Ser  Date Value Ref Range Status  08/19/2021 1.01 0.76 - 1.27 mg/dL Final   Creatinine, Urine  Date Value Ref Range Status  03/05/2019 82 mg/dL Final    Comment:    Performed at [facility], [address], [place],  [address]         Passed - Patient is not pregnant      Passed - Valid encounter within last 12 months    Recent Outpatient Visits          2 months ago Hypertension associated with diabetes ([place])   [facility] Jon Billings, NP   3 months ago Acute cough   [facility] Kathrine Haddock, NP   7 months ago Hypertension associated with diabetes [facility])   [facility] Jon Billings, NP   11 months ago Diabetes mellitus associated with hormonal etiology ([place])   [place], Lauren A, NP   1 year ago Pre-op evaluation   [place], NP      Future  Appointments            In 3 weeks Jon Billings, NP [facility]           . tamsulosin (FLOMAX) 0.4 MG CAPS capsule [Pharmacy Med Name: TAMSULOSIN HYDROCHLORIDE 0.4 MG Capsule] 90 capsule 1    Sig: TAKE [place]     Urology: Alpha-Adrenergic Blocker Failed - 10/26/2021  3:51 AM      Failed - PSA in normal range and within 360 days     Prostate Specific Ag, Serum  Date Value Ref Range Status  07/28/2020 2.4 0.0 - 4.0 ng/mL Final    Comment:    Roche ECLIA methodology. According to the American Urological Association, Serum PSA should decrease and remain at undetectable levels after radical prostatectomy. The AUA defines biochemical recurrence as an initial PSA value 0.2 ng/mL or greater followed by a subsequent confirmatory PSA value 0.2 ng/mL or greater. Values obtained with different assay methods or kits cannot be used interchangeably. Results cannot be interpreted as absolute evidence of the presence or absence of malignant disease.          Failed - Last BP in normal range    BP Readings from Last 1 Encounters:  08/19/21 (!) 145/68         Passed - Valid encounter within last 12 months    Recent Outpatient Visits          2 months ago Hypertension associated with diabetes ([place])   [facility] Jon Billings, NP   3 months ago Acute cough   [facility] Kathrine Haddock, NP   7 months ago Hypertension associated with diabetes [facility])   [facility] Jon Billings, NP   11 months ago Diabetes mellitus associated with hormonal etiology ([place])   [place] McElwee, Lauren A, NP   1 year ago Pre-op evaluation   [place], NP      Future Appointments            In 3 weeks Jon Billings, NP [facility], [facility]           . losartan (COZAAR) 100 MG tablet [Pharmacy Med Name: LOSARTAN POTASSIUM 100 MG Tablet] 90 tablet 1    Sig: TAKE 1 TABLET (100 MG TOTAL) BY MOUTH DAILY.     Cardiovascular:  Angiotensin Receptor Blockers Failed - 10/26/2021  3:51 AM      Failed - Last BP in normal range    BP Readings from Last 1 Encounters:  08/19/21 (!) 145/68         Passed - Cr in normal range and within 180 days    Creatinine, Ser  Date Value Ref Range Status  08/19/2021 1.01 0.76 - 1.27 mg/dL Final    Creatinine, Urine  Date Value Ref Range Status  03/05/2019 82 mg/dL Final    Comment:    Performed at [facility], [address] [place], [place] [address]         Passed - K in normal range and within 180 days    Potassium  Date Value Ref Range Status  08/19/2021 4.8 3.5 - 5.2 mmol/L Final         Passed - Patient is not pregnant      Passed - Valid encounter within last 6 months    Recent Outpatient Visits          2 months ago Hypertension associated with diabetes ([place])  [facility] Jon Billings, NP   3 months ago Acute cough   [facility] Kathrine Haddock, NP   7 months ago Hypertension associated with diabetes [facility])   [facility] Jon Billings, NP   11 months ago Diabetes mellitus associated with hormonal etiology ([place])   [place], Lauren A, NP   1 year ago Pre-op evaluation   [place], NP      Future Appointments            In 3 weeks Jon Billings, NP [place]           . amLODipine ([place]) 10 MG tablet [Pharmacy Med Name: AMLODIPINE BESYLATE 10 MG Tablet] 90 tablet 1    Sig: TAKE 1 TABLET EVERY DAY     Cardiovascular: Calcium Channel Blockers 2 Failed - 10/26/2021  3:51 AM      Failed - Last BP in normal range    BP Readings from Last 1 Encounters:  08/19/21 (!) 145/68         Passed - Last Heart Rate in normal range    Pulse Readings from Last 1 Encounters:  08/19/21 (!) 58         Passed - Valid encounter within last 6 months    Recent Outpatient Visits          2 months ago Hypertension associated with diabetes ([place])   [facility] Jon Billings, NP   3 months ago Acute cough   [facility] Kathrine Haddock, NP   7 months ago Hypertension associated with diabetes [facility])   [facility] Jon Billings, NP   11 months ago Diabetes mellitus associated with  hormonal etiology ([place])   [place] McElwee, Lauren A, NP   1 year ago Pre-op evaluation   [place] Jon Billings, NP      Future Appointments            In 3 weeks Jon Billings, NP [facility], [place]           . glipiZIDE (GLUCOTROL) 5 MG tablet [Pharmacy Med Name: GLIPIZIDE 5 MG Tablet] 90 tablet 1    Sig: TAKE 1 TABLET (5 MG TOTAL) BY MOUTH DAILY BEFORE BREAKFAST.     Endocrinology:  Diabetes - Sulfonylureas Passed - 10/26/2021  3:51 AM      Passed - HBA1C is between 0 and 7.9 and within 180 days    HB A1C (BAYER DCA - WAIVED)  Date Value Ref Range Status  07/28/2020 7.5 (H) <7.0 % Final    Comment:                                          Diabetic Adult            <7.0                                       Healthy Adult        4.3 - 5.7                                                           (  DCCT/NGSP) American Diabetes Association's Summary of Glycemic Recommendations for Adults with Diabetes: Hemoglobin A1c <7.0%. More stringent glycemic goals (A1c <6.0%) may further reduce complications at the cost of increased risk of hypoglycemia.    Hgb A1c MFr Bld  Date Value Ref Range Status  08/19/2021 6.9 (H) 4.8 - 5.6 % Final    Comment:             Prediabetes: 5.7 - 6.4          Diabetes: >6.4          Glycemic control for adults with diabetes: <7.0          Passed - Cr in normal range and within 360 days    Creatinine, Ser  Date Value Ref Range Status  08/19/2021 1.01 0.76 - 1.27 mg/dL Final   Creatinine, Urine  Date Value Ref Range Status  03/05/2019 82 mg/dL Final    Comment:    Performed at [facility], [address] [place], [place] [address]         Passed - Valid encounter within last 6 months    Recent Outpatient Visits          2 months ago Hypertension associated with diabetes [facility])   [facility] Jon Billings, NP   3 months ago Acute cough   [facility] Kathrine Haddock, NP   7 months ago Hypertension associated with diabetes [facility])   [facility] Jon Billings, NP   11 months ago Diabetes mellitus associated with hormonal etiology ([place])   [place], Lauren A, NP   1 year ago Pre-op evaluation   [place] Jon Billings, NP      Future Appointments            In 3 weeks Jon Billings, NP [facility], [place]

## 2021-10-27 NOTE — Telephone Encounter (Signed)
Requested medications are due for refill today.  yes  Requested medications are on the active medications list.  yes  Last refill. 06/03/2021 #90 1 refill  Future visit scheduled.   yes  Notes to clinic.  Labs are expired.    Requested Prescriptions  Pending Prescriptions Disp Refills   allopurinol (ZYLOPRIM) 300 MG tablet [Pharmacy Med Name: ALLOPURINOL 300 MG Tablet] 90 tablet 1    Sig: TAKE 1 TABLET (300 MG TOTAL) BY MOUTH DAILY.     Endocrinology:  Gout Agents - allopurinol Failed - 10/26/2021  3:51 AM      Failed - Uric Acid in normal range and within 360 days    Uric Acid  Date Value Ref Range Status  07/28/2020 2.4 (L) 3.8 - 8.4 mg/dL Final    Comment:               Therapeutic target for gout patients: <6.0         Failed - CBC within normal limits and completed in the last 12 months    WBC  Date Value Ref Range Status  09/09/2020 8.8 3.4 - 10.8 x10E3/uL Final  03/10/2019 6.4 4.0 - 10.5 K/uL Final   RBC  Date Value Ref Range Status  09/09/2020 4.94 4.14 - 5.80 x10E6/uL Final  03/10/2019 4.14 (L) 4.22 - 5.81 MIL/uL Final   Hemoglobin  Date Value Ref Range Status  09/09/2020 15.6 13.0 - 17.7 g/dL Final   Hematocrit  Date Value Ref Range Status  09/09/2020 44.8 37.5 - 51.0 % Final   MCHC  Date Value Ref Range Status  09/09/2020 34.8 31.5 - 35.7 g/dL Final  03/10/2019 34.6 30.0 - 36.0 g/dL Final   [facility]  Date Value Ref Range Status  09/09/2020 31.6 26.6 - 33.0 pg Final  03/10/2019 31.4 26.0 - 34.0 pg Final   MCV  Date Value Ref Range Status  09/09/2020 91 79 - 97 fL Final   No results found for: "PLTCOUNTKUC", "LABPLAT", "POCPLA" RDW  Date Value Ref Range Status  09/09/2020 13.2 11.6 - 15.4 % Final         Passed - Cr in normal range and within 360 days    Creatinine, Ser  Date Value Ref Range Status  08/19/2021 1.01 0.76 - 1.27 mg/dL Final   Creatinine, Urine  Date Value Ref Range Status  03/05/2019 82 mg/dL Final    Comment:    Performed  at [facility], [address] [place], [place] [address]         Passed - Valid encounter within last 12 months    Recent Outpatient Visits           2 months ago Hypertension associated with diabetes [facility])   [facility] Jon Billings, NP   3 months ago Acute cough   [facility] Kathrine Haddock, NP   7 months ago Hypertension associated with diabetes [facility])   [facility] Jon Billings, NP   11 months ago Diabetes mellitus associated with hormonal etiology ([place])   [place] McElwee, Lauren A, NP   1 year ago Pre-op evaluation   [place], NP       Future Appointments             In 3 weeks Jon Billings, NP [facility], [facility]             meloxicam (MOBIC) 15 MG tablet [Pharmacy Med Name: MELOXICAM 15 MG Tablet] 90  tablet 1    Sig: TAKE 1 TABLET EVERY DAY     Analgesics:  COX2 Inhibitors Failed - 10/26/2021  3:51 AM      Failed - Manual Review: Labs are only required if the patient has taken medication for more than 8 weeks.      Failed - HGB in normal range and within 360 days    Hemoglobin  Date Value Ref Range Status  09/09/2020 15.6 13.0 - 17.7 g/dL Final         Failed - HCT in normal range and within 360 days    Hematocrit  Date Value Ref Range Status  09/09/2020 44.8 37.5 - 51.0 % Final         Passed - Cr in normal range and within 360 days    Creatinine, Ser  Date Value Ref Range Status  08/19/2021 1.01 0.76 - 1.27 mg/dL Final   Creatinine, Urine  Date Value Ref Range Status  03/05/2019 82 mg/dL Final    Comment:    Performed at [facility], [address] [place], [place] [address]         Passed - AST in normal range and within 360 days    AST  Date Value Ref Range Status  08/19/2021 14 0 - 40 IU/L Final   AST (SGOT) Piccolo, Waived  Date Value Ref Range Status  03/28/2018 28 11 - 38 U/L Final         Passed  - ALT in normal range and within 360 days    ALT  Date Value Ref Range Status  08/19/2021 18 0 - 44 IU/L Final   ALT (SGPT) Piccolo, Waived  Date Value Ref Range Status  03/28/2018 35 10 - 47 U/L Final         Passed - eGFR is 30 or above and within 360 days    GFR calc Af Amer  Date Value Ref Range Status  01/15/2020 89 >59 mL/min/1.73 Final    Comment:    **[facility] currently reports eGFR in compliance with the current**   recommendations of the [employer]. [facility] will   update reporting as new guidelines are published from the NKF-ASN   Task force.    GFR calc non Af Amer  Date Value Ref Range Status  01/15/2020 77 >59 mL/min/1.73 Final   eGFR  Date Value Ref Range Status  08/19/2021 77 >59 mL/min/1.73 Final         Passed - Patient is not pregnant      Passed - Valid encounter within last 12 months    Recent Outpatient Visits           2 months ago Hypertension associated with diabetes ([place])   [facility] Jon Billings, NP   3 months ago Acute cough   [facility] Kathrine Haddock, NP   7 months ago Hypertension associated with diabetes [facility])   [facility] Jon Billings, NP   11 months ago Diabetes mellitus associated with hormonal etiology ([place])   [place], Lauren A, NP   1 year ago Pre-op evaluation   [place], NP       Future Appointments             In 3 weeks Jon Billings, NP [facility], [facility]            Signed Prescriptions Disp Refills   rosuvastatin (CRESTOR) 40 MG tablet 90 tablet 1  Sig: TAKE 1 TABLET (40 MG TOTAL) BY MOUTH DAILY.     Cardiovascular:  Antilipid - Statins 2 Failed - 10/26/2021  3:51 AM      Failed - Lipid Panel in normal range within the last 12 months    Cholesterol, Total  Date Value Ref Range Status  08/19/2021 130 100 - 199 mg/dL Final   Cholesterol Piccolo, Waived  Date Value  Ref Range Status  03/28/2018 122 <200 mg/dL Final    Comment:                            Desirable                <200                         Borderline High      200- 239                         High                     >239    LDL Chol Calc (NIH)  Date Value Ref Range Status  08/19/2021 70 0 - 99 mg/dL Final   HDL  Date Value Ref Range Status  08/19/2021 39 (L) >39 mg/dL Final   Triglycerides  Date Value Ref Range Status  08/19/2021 114 0 - 149 mg/dL Final   Triglycerides Piccolo,Waived  Date Value Ref Range Status  03/28/2018 232 (H) <150 mg/dL Final    Comment:                            Normal                   <150                         Borderline High     150 - 199                         High                200 - 499                         Very High                >499          Passed - Cr in normal range and within 360 days    Creatinine, Ser  Date Value Ref Range Status  08/19/2021 1.01 0.76 - 1.27 mg/dL Final   Creatinine, Urine  Date Value Ref Range Status  03/05/2019 82 mg/dL Final    Comment:    Performed at [facility], [address], [place], [place] [address]         [place] - Patient is not pregnant      Passed - Valid encounter within last 12 months    Recent Outpatient Visits           2 months ago Hypertension associated with diabetes [facility])   [facility] Jon Billings, NP   3 months ago Acute cough   [place], NP   7 months ago Hypertension associated  with diabetes [facility])   [facility] Jon Billings, NP   11 months ago Diabetes mellitus associated with hormonal etiology ([place])   [place] McElwee, Lauren A, NP   1 year ago Pre-op evaluation   [place], NP       Future Appointments             In 3 weeks Jon Billings, NP [place], [facility]             tamsulosin (FLOMAX) 0.4 MG CAPS  capsule 90 capsule 1    Sig: TAKE 1 [place]     Urology: Alpha-Adrenergic Blocker Failed - 10/26/2021  3:51 AM      Failed - PSA in normal range and within 360 days    Prostate Specific Ag, Serum  Date Value Ref Range Status  07/28/2020 2.4 0.0 - 4.0 ng/mL Final    Comment:    Roche ECLIA methodology. According to the American Urological Association, Serum PSA should decrease and remain at undetectable levels after radical prostatectomy. The AUA defines biochemical recurrence as an initial PSA value 0.2 ng/mL or greater followed by a subsequent confirmatory PSA value 0.2 ng/mL or greater. Values obtained with different assay methods or kits cannot be used interchangeably. Results cannot be interpreted as absolute evidence of the presence or absence of malignant disease.          Failed - Last BP in normal range    BP Readings from Last 1 Encounters:  08/19/21 (!) 145/68         Passed - Valid encounter within last 12 months    Recent Outpatient Visits           2 months ago Hypertension associated with diabetes ([place])   [facility] Jon Billings, NP   3 months ago Acute cough   [facility] Kathrine Haddock, NP   7 months ago Hypertension associated with diabetes [facility])   [facility] Jon Billings, NP   11 months ago Diabetes mellitus associated with hormonal etiology ([place])   [place], Lauren A, NP   1 year ago Pre-op evaluation   [place], NP       Future Appointments             In 3 weeks Jon Billings, NP [facility]             losartan (COZAAR) 100 MG tablet 90 tablet 1    Sig: TAKE 1 TABLET (100 MG TOTAL) BY MOUTH DAILY.     Cardiovascular:  Angiotensin Receptor Blockers Failed - 10/26/2021  3:51 AM      Failed - Last BP in normal range    BP Readings from Last 1 Encounters:  08/19/21 (!) 145/68         Passed - Cr in  normal range and within 180 days    Creatinine, Ser  Date Value Ref Range Status  08/19/2021 1.01 0.76 - 1.27 mg/dL Final   Creatinine, Urine  Date Value Ref Range Status  03/05/2019 82 mg/dL Final    Comment:    Performed at [facility], [address] [place], [place] [address]         Passed - K in normal range and within 180 days    Potassium  Date Value Ref Range Status  08/19/2021 4.8 3.5 - 5.2 mmol/L Final  Passed - Patient is not pregnant      Passed - Valid encounter within last 6 months    Recent Outpatient Visits           2 months ago Hypertension associated with diabetes ([place])   [place], Karen, NP   3 months ago Acute cough   [facility] Kathrine Haddock, NP   7 months ago Hypertension associated with diabetes [facility])   [facility] Jon Billings, NP   11 months ago Diabetes mellitus associated with hormonal etiology ([place])   [place], Lauren A, NP   1 year ago Pre-op evaluation   [place], NP       Future Appointments             In 3 weeks Jon Billings, NP [place], [facility]             amLODipine (NORVASC) 10 MG tablet 90 tablet 1    Sig: TAKE 1 TABLET EVERY DAY     Cardiovascular: Calcium Channel Blockers 2 Failed - 10/26/2021  3:51 AM      Failed - Last BP in normal range    BP Readings from Last 1 Encounters:  08/19/21 (!) 145/68         Passed - Last Heart Rate in normal range    Pulse Readings from Last 1 Encounters:  08/19/21 (!) 12         Passed - Valid encounter within last 6 months    Recent Outpatient Visits           2 months ago Hypertension associated with diabetes ([place])   [facility] Jon Billings, NP   3 months ago Acute cough   [facility] Kathrine Haddock, NP   7 months ago Hypertension associated with diabetes [facility])   [facility]  Jon Billings, NP   11 months ago Diabetes mellitus associated with hormonal etiology ([place])   [place], Lauren A, NP   1 year ago Pre-op evaluation   [place], NP       Future Appointments             In 3 weeks Jon Billings, NP [facility]             glipiZIDE (GLUCOTROL) 5 MG tablet 90 tablet 1    Sig: TAKE 1 TABLET (5 MG TOTAL) BY MOUTH DAILY BEFORE BREAKFAST.     Endocrinology:  Diabetes - Sulfonylureas Passed - 10/26/2021  3:51 AM      Passed - HBA1C is between 0 and 7.9 and within 180 days    HB A1C (BAYER DCA - WAIVED)  Date Value Ref Range Status  07/28/2020 7.5 (H) <7.0 % Final    Comment:                                          Diabetic Adult            <7.0                                       Healthy Adult        4.3 - 5.7                                                           (  DCCT/NGSP) American Diabetes Association's Summary of Glycemic Recommendations for Adults with Diabetes: Hemoglobin A1c <7.0%. More stringent glycemic goals (A1c <6.0%) may further reduce complications at the cost of increased risk of hypoglycemia.    Hgb A1c MFr Bld  Date Value Ref Range Status  08/19/2021 6.9 (H) 4.8 - 5.6 % Final    Comment:             Prediabetes: 5.7 - 6.4          Diabetes: >6.4          Glycemic control for adults with diabetes: <7.0          Passed - Cr in normal range and within 360 days    Creatinine, Ser  Date Value Ref Range Status  08/19/2021 1.01 0.76 - 1.27 mg/dL Final   Creatinine, Urine  Date Value Ref Range Status  03/05/2019 82 mg/dL Final    Comment:    Performed at [facility], [address] [place], [place] [address]         Passed - Valid encounter within last 6 months    Recent Outpatient Visits           2 months ago Hypertension associated with diabetes [facility])   [facility] Jon Billings, NP   3 months ago  Acute cough   [facility] Kathrine Haddock, NP   7 months ago Hypertension associated with diabetes [facility])   [facility] Jon Billings, NP   11 months ago Diabetes mellitus associated with hormonal etiology ([place])   [place], Lauren A, NP   1 year ago Pre-op evaluation   [place] Jon Billings, NP       Future Appointments             In 3 weeks Jon Billings, NP [facility], [place]

## 2021-11-02 ENCOUNTER — Telehealth: Payer: Self-pay | Admitting: Nurse Practitioner

## 2021-11-02 ENCOUNTER — Other Ambulatory Visit: Payer: Self-pay

## 2021-11-02 MED ORDER — ACCU-CHEK AVIVA PLUS VI STRP: ORAL_STRIP | 12 refills | Status: DC

## 2021-11-02 NOTE — Telephone Encounter (Signed)
Attempted to call patient to inform him of test strips being sent to pharmacy. Unable to LVM.

## 2021-11-02 NOTE — Telephone Encounter (Signed)
Medication sent to the pharmacy.

## 2021-11-02 NOTE — Telephone Encounter (Signed)
-----   Message from Dorcas Carrow, DO sent at 11/02/2021  9:14 AM EDT ----- Needs test strips at [facility] in [place]

## 2021-11-10 ENCOUNTER — Encounter: Payer: Self-pay | Admitting: Nurse Practitioner

## 2021-11-22 ENCOUNTER — Ambulatory Visit: Payer: Medicare PPO | Admitting: Nurse Practitioner

## 2021-12-07 ENCOUNTER — Telehealth: Payer: Self-pay

## 2021-12-07 ENCOUNTER — Ambulatory Visit (INDEPENDENT_AMBULATORY_CARE_PROVIDER_SITE_OTHER): Payer: Medicare PPO | Admitting: Nurse Practitioner

## 2021-12-07 ENCOUNTER — Encounter: Payer: Self-pay | Admitting: Nurse Practitioner

## 2021-12-07 VITALS — BP 132/60 | HR 61 | Temp 97.5°F

## 2021-12-07 DIAGNOSIS — I2510 Atherosclerotic heart disease of native coronary artery without angina pectoris: Secondary | ICD-10-CM

## 2021-12-07 DIAGNOSIS — I152 Hypertension secondary to endocrine disorders: Secondary | ICD-10-CM | POA: Diagnosis not present

## 2021-12-07 DIAGNOSIS — I700 Atherosclerosis of aorta: Secondary | ICD-10-CM | POA: Diagnosis not present

## 2021-12-07 DIAGNOSIS — E538 Deficiency of other specified B group vitamins: Secondary | ICD-10-CM | POA: Diagnosis not present

## 2021-12-07 DIAGNOSIS — S78111D Complete traumatic amputation at level between right hip and knee, subsequent encounter: Secondary | ICD-10-CM | POA: Diagnosis not present

## 2021-12-07 DIAGNOSIS — E785 Hyperlipidemia, unspecified: Secondary | ICD-10-CM

## 2021-12-07 DIAGNOSIS — E1159 Type 2 diabetes mellitus with other circulatory complications: Secondary | ICD-10-CM | POA: Diagnosis not present

## 2021-12-07 DIAGNOSIS — E1169 Type 2 diabetes mellitus with other specified complication: Secondary | ICD-10-CM

## 2021-12-07 DIAGNOSIS — E559 Vitamin D deficiency, unspecified: Secondary | ICD-10-CM | POA: Diagnosis not present

## 2021-12-07 DIAGNOSIS — I2583 Coronary atherosclerosis due to lipid rich plaque: Secondary | ICD-10-CM | POA: Diagnosis not present

## 2021-12-07 MED ORDER — VITAMIN B-12 1000 MCG PO TABS: 1000.0000 ug | ORAL_TABLET | Freq: Every day | ORAL | 1 refills | Status: AC

## 2021-12-07 MED ORDER — VITAMIN D (CHOLECALCIFEROL) 25 MCG (1000 UT) PO TABS
2000.0000 [IU] | ORAL_TABLET | Freq: Every day | ORAL | 1 refills | Status: DC
Start: 2021-12-07 — End: 2021-12-07

## 2021-12-07 MED ORDER — TAMSULOSIN HCL 0.4 MG PO CAPS
0.4000 mg | ORAL_CAPSULE | Freq: Every day | ORAL | 1 refills | Status: DC
Start: 2021-12-07 — End: 2022-08-01

## 2021-12-07 MED ORDER — GLIPIZIDE 5 MG PO TABS: 5.0000 mg | ORAL_TABLET | Freq: Every day | ORAL | 1 refills | Status: DC

## 2021-12-07 MED ORDER — PANTOPRAZOLE SODIUM 40 MG PO TBEC
40.0000 mg | DELAYED_RELEASE_TABLET | Freq: Every day | ORAL | 1 refills | Status: DC
Start: 2021-12-07 — End: 2022-08-15

## 2021-12-07 MED ORDER — ALLOPURINOL 300 MG PO TABS: 300.0000 mg | ORAL_TABLET | Freq: Every day | ORAL | 1 refills | Status: DC

## 2021-12-07 MED ORDER — AMLODIPINE BESYLATE 10 MG PO TABS: 10.0000 mg | ORAL_TABLET | Freq: Every day | ORAL | 1 refills | Status: DC

## 2021-12-07 MED ORDER — ROSUVASTATIN CALCIUM 40 MG PO TABS: 40.0000 mg | ORAL_TABLET | Freq: Every day | ORAL | 1 refills | Status: DC

## 2021-12-07 MED ORDER — LOSARTAN POTASSIUM 100 MG PO TABS: 100.0000 mg | ORAL_TABLET | Freq: Every day | ORAL | 1 refills | Status: DC

## 2021-12-07 MED ORDER — TAMSULOSIN HCL 0.4 MG PO CAPS: 0.4000 mg | ORAL_CAPSULE | Freq: Every day | ORAL | 1 refills | Status: DC

## 2021-12-07 MED ORDER — PANTOPRAZOLE SODIUM 40 MG PO TBEC: 40.0000 mg | DELAYED_RELEASE_TABLET | Freq: Every day | ORAL | 1 refills | Status: DC

## 2021-12-07 MED ORDER — VITAMIN D (CHOLECALCIFEROL) 25 MCG (1000 UT) PO TABS: 2000.0000 [IU] | ORAL_TABLET | Freq: Every day | ORAL | 1 refills | Status: DC

## 2021-12-07 MED ORDER — EMPAGLIFLOZIN 10 MG PO TABS: 10.0000 mg | ORAL_TABLET | Freq: Every day | ORAL | 1 refills | Status: DC

## 2021-12-07 MED ORDER — METFORMIN HCL 500 MG PO TABS: ORAL_TABLET | ORAL | 1 refills | Status: DC

## 2021-12-07 MED ORDER — ACCU-CHEK AVIVA PLUS VI STRP
ORAL_STRIP | 12 refills | Status: DC
Start: 2021-12-07 — End: 2021-12-08

## 2021-12-07 NOTE — Assessment & Plan Note (Signed)
Chronic.  Controlled.  Continue with current medication regimen.  Labs ordered today.  Return to clinic in 3 months for reevaluation.  Call sooner if concerns arise.   

## 2021-12-07 NOTE — Assessment & Plan Note (Signed)
Chronic.  Under good control on current regimen. Continue current regimen of Crestor 40mg . Continue to monitor. Refills sent today. Call with any concerns. Labs drawn today.  Follow up in 3 months.  Call sooner if concerns arise.

## 2021-12-07 NOTE — Assessment & Plan Note (Signed)
Labs ordered at visit today.  Will make recommendations based on lab results.   

## 2021-12-07 NOTE — Assessment & Plan Note (Signed)
Chronic.  Last A1c was 6.9.  Will check labs at visit today.  If A1c is less than 8 will stop the Metformin.  Still taking Metformin 500mg  BID.  Continue with current medication regimen.  Refills sent today. Labs ordered today.  Return to clinic in 3 months for reevaluation.  Call sooner if concerns arise.

## 2021-12-07 NOTE — Telephone Encounter (Signed)
Patient states he would like all medications to go through [facility] and test strips should be going to [facility] in [place].

## 2021-12-07 NOTE — Progress Notes (Signed)
BP 132/60 Comment: home blood pressure reading this morning  Pulse 61   Temp (!) 97.5 F (36.4 C) (Oral)   SpO2 95%    Subjective:    Patient ID: Carl Bryan, male    DOB: 01-05-1946, 76 y.o.   MRN: [id number]  HPI: Carl Bryan is a 76 y.o. male  Chief Complaint  Patient presents with   Hypertension   Hyperlipidemia   Diabetes    3 month follow up    HYPERTENSION / [employer] Satisfied with current treatment? yes Duration of hypertension: years BP monitoring frequency: sometimes BP range: 130/60 BP medication side effects: no Past BP meds: amlodipine and losartan (cozaar) Duration of hyperlipidemia: years Cholesterol medication side effects: no Cholesterol supplements: none Past cholesterol medications: rosuvastatin (crestor) Medication compliance: excellent compliance Aspirin: no Recent stressors: no Recurrent headaches: no Visual changes: no Palpitations: no Dyspnea: no Chest pain: no Lower extremity edema: no Dizzy/lightheaded: no  DIABETES Denies concerns during visit today.   Hypoglycemic episodes:no Polydipsia/polyuria: no Visual disturbance: no Chest pain: no Paresthesias: no Glucose Monitoring: yes  Accucheck frequency: Daily  Fasting glucose: 115 today  Post prandial:  Evening:  Before meals: Taking Insulin?: no  Long acting insulin:  Short acting insulin: Blood Pressure Monitoring: daily Retinal Examination: Up to Date- will get report Foot Exam: Not up to Date Diabetic Education: Not Completed Pneumovax: Up to Date Influenza: Up to Date Aspirin: no  Relevant past medical, surgical, family and social history reviewed and updated as indicated. Interim medical history since our last visit reviewed. Allergies and medications reviewed and updated.  Review of Systems  Eyes:  Negative for visual disturbance.  Respiratory:  Negative for chest tightness and shortness of breath.   Cardiovascular:  Negative for chest pain, palpitations and  leg swelling.  Gastrointestinal:  Positive for diarrhea.  Endocrine: Negative for polydipsia and polyuria.  Neurological:  Negative for dizziness, light-headedness, numbness and headaches.    Per HPI unless specifically indicated above     Objective:    BP 132/60 Comment: home blood pressure reading this morning  Pulse 61   Temp (!) 97.5 F (36.4 C) (Oral)   SpO2 95%   Wt Readings from Last 3 Encounters:  07/26/21 156 lb (70.8 kg)  03/08/21 156 lb 8 oz (71 kg)  11/11/20 156 lb 3.2 oz (70.9 kg)    Physical Exam Vitals and nursing note reviewed.  Constitutional:      General: He is not in acute distress.    Appearance: Normal appearance. He is not ill-appearing, toxic-appearing or diaphoretic.  HENT:     Head: Normocephalic.     Right Ear: External ear normal.     Left Ear: External ear normal.     Nose: Nose normal. No congestion or rhinorrhea.     Mouth/Throat:     Mouth: Mucous membranes are moist.  Eyes:     General:        Right eye: No discharge.        Left eye: No discharge.     Extraocular Movements: Extraocular movements intact.     Conjunctiva/sclera: Conjunctivae normal.     Pupils: Pupils are equal, round, and reactive to light.  Cardiovascular:     Rate and Rhythm: Normal rate and regular rhythm.     Heart sounds: No murmur heard. Pulmonary:     Effort: Pulmonary effort is normal. No respiratory distress.     Breath sounds: Normal breath sounds. No wheezing, rhonchi or rales.  Abdominal:     General: Abdomen is flat. Bowel sounds are normal.  Musculoskeletal:     Cervical back: Normal range of motion and neck supple.     Comments: Uses crutches due to L AKA.  Skin:    General: Skin is warm and dry.     Capillary Refill: Capillary refill takes less than 2 seconds.  Neurological:     General: No focal deficit present.     Mental Status: He is alert and oriented to person, place, and time.  Psychiatric:        Mood and Affect: Mood normal.         Behavior: Behavior normal.        Thought Content: Thought content normal.        Judgment: Judgment normal.     Results for orders placed or performed in visit on 08/19/21  Comp Met (CMET)  Result Value Ref Range   Glucose 153 (H) 70 - 99 mg/dL   BUN 17 8 - 27 mg/dL   Creatinine, Ser 1.01 0.76 - 1.27 mg/dL   eGFR 77 >59 mL/min/1.73   BUN/Creatinine Ratio 17 10 - 24   Sodium 137 134 - 144 mmol/L   Potassium 4.8 3.5 - 5.2 mmol/L   Chloride 103 96 - 106 mmol/L   CO2 20 20 - 29 mmol/L   Calcium 10.1 8.6 - 10.2 mg/dL   Total Protein 7.1 6.0 - 8.5 g/dL   Albumin 4.3 3.7 - 4.7 g/dL   Globulin, Total 2.8 1.5 - 4.5 g/dL   Albumin/Globulin Ratio 1.5 1.2 - 2.2   Bilirubin Total 0.5 0.0 - 1.2 mg/dL   Alkaline Phosphatase 80 44 - 121 IU/L   AST 14 0 - 40 IU/L   ALT 18 0 - 44 IU/L  HgB A1c  Result Value Ref Range   Hgb A1c MFr Bld 6.9 (H) 4.8 - 5.6 %   Est. average glucose Bld gHb Est-mCnc 151 mg/dL  Vitamin D (25 hydroxy)  Result Value Ref Range   Vit D, 25-Hydroxy 28.9 (L) 30.0 - 100.0 ng/mL  B12  Result Value Ref Range   Vitamin B-12 1,884 (H) 232 - 1,245 pg/mL  Lipid Profile  Result Value Ref Range   Cholesterol, Total 130 100 - 199 mg/dL   Triglycerides 114 0 - 149 mg/dL   HDL 39 (L) >39 mg/dL   VLDL Cholesterol Cal 21 5 - 40 mg/dL   LDL Chol Calc (NIH) 70 0 - 99 mg/dL   Chol/HDL Ratio 3.3 0.0 - 5.0 ratio      Assessment & Plan:   Problem List Items Addressed This Visit       Cardiovascular and Mediastinum   Hypertension associated with diabetes ([place]) - Primary    Chronic.  Controlled.   Continue with current medication regimen.  Labs ordered today.  Return to clinic in 3 months for reevaluation.  Call sooner if concerns arise.        Relevant Medications   amLODipine (NORVASC) 10 MG tablet   empagliflozin (JARDIANCE) 10 MG TABS tablet   glipiZIDE (GLUCOTROL) 5 MG tablet   losartan (COZAAR) 100 MG tablet   metFORMIN (GLUCOPHAGE) 500 MG tablet   rosuvastatin  (CRESTOR) 40 MG tablet   Other Relevant Orders   Comp Met (CMET)   CAD (coronary artery disease)    Chronic.  Controlled.  Continue with current medication regimen of Crestor 81m. Refills sent today.   Labs ordered today.  Return to clinic  in 6 months for reevaluation.  Call sooner if concerns arise.        Relevant Medications   amLODipine (NORVASC) 10 MG tablet   losartan (COZAAR) 100 MG tablet   rosuvastatin (CRESTOR) 40 MG tablet   Aortic atherosclerosis (HCC)    Chronic.  Under good control on current regimen. Continue current regimen of Crestor 57m. Continue to monitor. Refills sent today. Call with any concerns. Labs drawn today.  Follow up in 3 months.  Call sooner if concerns arise.      Relevant Medications   amLODipine (NORVASC) 10 MG tablet   losartan (COZAAR) 100 MG tablet   rosuvastatin (CRESTOR) 40 MG tablet     Endocrine   Hyperlipidemia associated with type 2 diabetes mellitus (HCC)    Chronic.  Controlled.  Continue with current medication regimen on Crestor 456m  Labs ordered today.  Return to clinic in 3 months for reevaluation.  Call sooner if concerns arise.        Relevant Medications   amLODipine (NORVASC) 10 MG tablet   empagliflozin (JARDIANCE) 10 MG TABS tablet   glipiZIDE (GLUCOTROL) 5 MG tablet   losartan (COZAAR) 100 MG tablet   metFORMIN (GLUCOPHAGE) 500 MG tablet   rosuvastatin (CRESTOR) 40 MG tablet   Diabetes mellitus associated with hormonal etiology (HCC)    Chronic.  Last A1c was 6.9.  Will check labs at visit today.  If A1c is less than 8 will stop the Metformin.  Still taking Metformin 50031mID.  Continue with current medication regimen.  Refills sent today. Labs ordered today.  Return to clinic in 3 months for reevaluation.  Call sooner if concerns arise.        Relevant Medications   allopurinol (ZYLOPRIM) 300 MG tablet   amLODipine (NORVASC) 10 MG tablet   empagliflozin (JARDIANCE) 10 MG TABS tablet   glipiZIDE (GLUCOTROL) 5 MG  tablet   losartan (COZAAR) 100 MG tablet   metFORMIN (GLUCOPHAGE) 500 MG tablet   rosuvastatin (CRESTOR) 40 MG tablet   Other Relevant Orders   HgB A1c     Other   Traumatic above-knee amputation of right lower extremity, subsequent encounter ([place]  Doing well. No concerns. Continue to monitor.       Vitamin D deficiency    Labs ordered at visit today.  Will make recommendations based on lab results.        Relevant Orders   Vitamin D (25 hydroxy)   B12 deficiency    Labs ordered at visit today.  Will make recommendations based on lab results.        Relevant Orders   B12     Follow up plan: Return in about 3 months (around 03/09/2022) for HTN, HLD, DM2 FU.

## 2021-12-07 NOTE — Assessment & Plan Note (Signed)
Chronic.  Controlled.  Continue with current medication regimen on Crestor 40mg.  Labs ordered today.  Return to clinic in 3 months for reevaluation.  Call sooner if concerns arise.   

## 2021-12-07 NOTE — Assessment & Plan Note (Signed)
Doing well. No concerns. Continue to monitor.  

## 2021-12-07 NOTE — Assessment & Plan Note (Signed)
Chronic.  Controlled.  Continue with current medication regimen of Crestor 40mg . Refills sent today.   Labs ordered today.  Return to clinic in 6 months for reevaluation.  Call sooner if concerns arise.

## 2021-12-07 NOTE — Telephone Encounter (Signed)
Medications sent to the pharmacy.

## 2021-12-08 ENCOUNTER — Telehealth: Payer: Self-pay

## 2021-12-08 LAB — COMPREHENSIVE METABOLIC PANEL
ALT: 24 IU/L (ref 0–44)
AST: 16 IU/L (ref 0–40)
Albumin/Globulin Ratio: 2.1 (ref 1.2–2.2)
Albumin: 4.7 g/dL (ref 3.8–4.8)
Alkaline Phosphatase: 75 IU/L (ref 44–121)
BUN/Creatinine Ratio: 11 (ref 10–24)
BUN: 15 mg/dL (ref 8–27)
Bilirubin Total: 0.5 mg/dL (ref 0.0–1.2)
CO2: 19 mmol/L — ABNORMAL LOW (ref 20–29)
Calcium: 10.3 mg/dL — ABNORMAL HIGH (ref 8.6–10.2)
Chloride: 102 mmol/L (ref 96–106)
Creatinine, Ser: 1.35 mg/dL — ABNORMAL HIGH (ref 0.76–1.27)
Globulin, Total: 2.2 g/dL (ref 1.5–4.5)
Glucose: 191 mg/dL — ABNORMAL HIGH (ref 70–99)
Potassium: 4.8 mmol/L (ref 3.5–5.2)
Sodium: 139 mmol/L (ref 134–144)
Total Protein: 6.9 g/dL (ref 6.0–8.5)
eGFR: 54 mL/min/{1.73_m2} — ABNORMAL LOW (ref >59)

## 2021-12-08 LAB — HEMOGLOBIN A1C
Est. average glucose Bld gHb Est-mCnc: 171 mg/dL
Hgb A1c MFr Bld: 7.6 % — ABNORMAL HIGH (ref 4.8–5.6)

## 2021-12-08 LAB — VITAMIN D 25 HYDROXY (VIT D DEFICIENCY, FRACTURES): Vit D, 25-Hydroxy: 30.5 ng/mL (ref 30.0–100.0)

## 2021-12-08 LAB — VITAMIN B12: Vitamin B-12: >2000 pg/mL — ABNORMAL HIGH (ref 232–1245)

## 2021-12-08 MED ORDER — ACCU-CHEK AVIVA PLUS VI STRP: 1.0000 | ORAL_STRIP | Freq: Every day | 12 refills | Status: DC

## 2021-12-08 NOTE — Telephone Encounter (Signed)
[facility] in [place] asking to resend strips with specific directions for accu-check strips for insurance please.

## 2021-12-08 NOTE — Progress Notes (Signed)
Please let patietn know that his A1c increased to 7.6.  Please remind him to follow a low carb diet.  Now that he has decreased his Metformin dose his A1c has increased so we need to modify his diet some.  His kidney function declined slightly but that is likely because his A1c increased.  We will recheck this at his next visit.  Please let me know if he has any questions.

## 2021-12-08 NOTE — Telephone Encounter (Signed)
Completed.

## 2021-12-15 ENCOUNTER — Ambulatory Visit: Payer: Self-pay | Admitting: *Deleted

## 2021-12-15 NOTE — Telephone Encounter (Signed)
Spoke with pharmacy staff. Issue has been resolved, no further actions.

## 2021-12-15 NOTE — Telephone Encounter (Signed)
Reason for Disposition  [1] Caller has NON-URGENT medicine question about med that PCP prescribed AND [2] triager unable to answer question  Answer Assessment - Initial Assessment Questions 1. NAME of MEDICINE: "What medicine(s) are you calling about?"     Pharmacy at [facility] in [place], Shanda Bumps calling in.  [phone] Test strips 2. QUESTION: "What is your question?" (e.g., double dose of medicine, side effect)     How often is he testing?   How many strips? 3. PRESCRIBER: "Who prescribed the medicine?" Reason: if prescribed by specialist, call should be referred to that group.     Larae Grooms, NP 4. SYMPTOMS: "Do you have any symptoms?" If Yes, ask: "What symptoms are you having?"  "How bad are the symptoms (e.g., mild, moderate, severe)     N/A 5. PREGNANCY:  "Is there any chance that you are pregnant?" "When was your last menstrual period?"     N/A  Protocols used: Medication Question Call-A-AH

## 2021-12-15 NOTE — Telephone Encounter (Signed)
Regarding his test strips.   Pharmacy needs to know how often he is to check his glucose.    How many to dispense.

## 2021-12-28 ENCOUNTER — Other Ambulatory Visit: Payer: Self-pay | Admitting: Nurse Practitioner

## 2021-12-28 DIAGNOSIS — E1169 Type 2 diabetes mellitus with other specified complication: Secondary | ICD-10-CM

## 2021-12-29 NOTE — Telephone Encounter (Signed)
Requested medication (s) are due for refill today: yes  Requested medication (s) are on the active medication list: yes  Last refill:  06/03/21 #90/1  Future visit scheduled: yes  Notes to clinic:  Unable to refill per protocol due to failed labs, no updated results.     Requested Prescriptions  Pending Prescriptions Disp Refills   meloxicam (MOBIC) 15 MG tablet [Pharmacy Med Name: MELOXICAM 15 MG Tablet] 90 tablet 1    Sig: TAKE 1 TABLET EVERY DAY     Analgesics:  COX2 Inhibitors Failed - 12/29/2021  9:40 AM      Failed - Manual Review: Labs are only required if the patient has taken medication for more than 8 weeks.      Failed - HGB in normal range and within 360 days    Hemoglobin  Date Value Ref Range Status  09/09/2020 15.6 13.0 - 17.7 g/dL Final         Failed - Cr in normal range and within 360 days    Creatinine, Ser  Date Value Ref Range Status  12/07/2021 1.35 (H) 0.76 - 1.27 mg/dL Final   Creatinine, Urine  Date Value Ref Range Status  03/05/2019 82 mg/dL Final    Comment:    Performed at [facility], [address] [place], [place] [address]         Failed - HCT in normal range and within 360 days    Hematocrit  Date Value Ref Range Status  09/09/2020 44.8 37.5 - 51.0 % Final         Passed - AST in normal range and within 360 days    AST  Date Value Ref Range Status  12/07/2021 16 0 - 40 IU/L Final   AST (SGOT) Piccolo, Waived  Date Value Ref Range Status  03/28/2018 28 11 - 38 U/L Final         Passed - ALT in normal range and within 360 days    ALT  Date Value Ref Range Status  12/07/2021 24 0 - 44 IU/L Final   ALT (SGPT) Piccolo, Waived  Date Value Ref Range Status  03/28/2018 35 10 - 47 U/L Final         Passed - eGFR is 30 or above and within 360 days    GFR calc Af Amer  Date Value Ref Range Status  01/15/2020 89 >59 mL/min/1.73 Final    Comment:    **[facility] currently reports eGFR in compliance with the current**    recommendations of the [employer]. [facility] will   update reporting as new guidelines are published from the NKF-ASN   Task force.    GFR calc non Af Amer  Date Value Ref Range Status  01/15/2020 77 >59 mL/min/1.73 Final   eGFR  Date Value Ref Range Status  12/07/2021 54 (L) >59 mL/min/1.73 Final         Passed - Patient is not pregnant      Passed - Valid encounter within last 12 months    Recent Outpatient Visits           3 weeks ago Hypertension associated with diabetes ([place])   [facility] Jon Billings, NP   4 months ago Hypertension associated with diabetes [facility])   [facility] Jon Billings, NP   5 months ago Acute cough   [facility] Kathrine Haddock, NP   9 months ago Hypertension associated with diabetes [facility]   [place]  Jon Billings, NP   1 year ago Diabetes mellitus associated with hormonal etiology [facility])   [place], Scheryl Darter, NP       Future Appointments             In 1 month Jon Billings, NP [place], [facility]            Refused Prescriptions Disp Refills   allopurinol (ZYLOPRIM) 300 MG tablet [Pharmacy Med Name: ALLOPURINOL 300 MG Tablet] 90 tablet 1    Sig: TAKE 1 TABLET (300 MG TOTAL) BY MOUTH DAILY.     Endocrinology:  Gout Agents - allopurinol Failed - 12/29/2021  9:40 AM      Failed - Uric Acid in normal range and within 360 days    Uric Acid  Date Value Ref Range Status  07/28/2020 2.4 (L) 3.8 - 8.4 mg/dL Final    Comment:               Therapeutic target for gout patients: <6.0         Failed - Cr in normal range and within 360 days    Creatinine, Ser  Date Value Ref Range Status  12/07/2021 1.35 (H) 0.76 - 1.27 mg/dL Final   Creatinine, Urine  Date Value Ref Range Status  03/05/2019 82 mg/dL Final    Comment:    Performed at [facility], [address] [place],  [address]         Failed -  CBC within normal limits and completed in the last 12 months    WBC  Date Value Ref Range Status  09/09/2020 8.8 3.4 - 10.8 x10E3/uL Final  03/10/2019 6.4 4.0 - 10.5 K/uL Final   RBC  Date Value Ref Range Status  09/09/2020 4.94 4.14 - 5.80 x10E6/uL Final  03/10/2019 4.14 (L) 4.22 - 5.81 MIL/uL Final   Hemoglobin  Date Value Ref Range Status  09/09/2020 15.6 13.0 - 17.7 g/dL Final   Hematocrit  Date Value Ref Range Status  09/09/2020 44.8 37.5 - 51.0 % Final   MCHC  Date Value Ref Range Status  09/09/2020 34.8 31.5 - 35.7 g/dL Final  03/10/2019 34.6 30.0 - 36.0 g/dL Final   [facility]  Date Value Ref Range Status  09/09/2020 31.6 26.6 - 33.0 pg Final  03/10/2019 31.4 26.0 - 34.0 pg Final   MCV  Date Value Ref Range Status  09/09/2020 91 79 - 97 fL Final   No results found for: "PLTCOUNTKUC", "LABPLAT", "POCPLA" RDW  Date Value Ref Range Status  09/09/2020 13.2 11.6 - 15.4 % Final         Passed - Valid encounter within last 12 months    Recent Outpatient Visits           3 weeks ago Hypertension associated with diabetes ([place])   [facility] Jon Billings, NP   4 months ago Hypertension associated with diabetes ([place])   [facility] Jon Billings, NP   5 months ago Acute cough   [facility] Kathrine Haddock, NP   9 months ago Hypertension associated with diabetes [facility])   [facility] Jon Billings, NP   1 year ago Diabetes mellitus associated with hormonal etiology ([place])   [place], Scheryl Darter, NP       Future Appointments             In 1 month Jon Billings, NP [place], [place]

## 2021-12-29 NOTE — Telephone Encounter (Signed)
[place], spoke with [place], Pharm Tech who states medication was sent out to pt on 12/07/21. She would cancel the request for allopurinol. Pt also has refill on file. She was requesting rxs for meter, test strips, lancets, and level 1 solution. I asked that she send those in, fax was already sent but she states she will fax again. No further assistance needed.   Requested Prescriptions  Pending Prescriptions Disp Refills   meloxicam (MOBIC) 15 MG tablet [Pharmacy Med Name: MELOXICAM 15 MG Tablet] 90 tablet 1    Sig: TAKE 1 TABLET EVERY DAY     Analgesics:  COX2 Inhibitors Failed - 12/28/2021  3:08 PM      Failed - Manual Review: Labs are only required if the patient has taken medication for more than 8 weeks.      Failed - HGB in normal range and within 360 days    Hemoglobin  Date Value Ref Range Status  09/09/2020 15.6 13.0 - 17.7 g/dL Final         Failed - Cr in normal range and within 360 days    Creatinine, Ser  Date Value Ref Range Status  12/07/2021 1.35 (H) 0.76 - 1.27 mg/dL Final   Creatinine, Urine  Date Value Ref Range Status  03/05/2019 82 mg/dL Final    Comment:    Performed at [facility], [address] [place], [place] [address]         Failed - HCT in normal range and within 360 days    Hematocrit  Date Value Ref Range Status  09/09/2020 44.8 37.5 - 51.0 % Final         Passed - AST in normal range and within 360 days    AST  Date Value Ref Range Status  12/07/2021 16 0 - 40 IU/L Final   AST (SGOT) Piccolo, Waived  Date Value Ref Range Status  03/28/2018 28 11 - 38 U/L Final         Passed - ALT in normal range and within 360 days    ALT  Date Value Ref Range Status  12/07/2021 24 0 - 44 IU/L Final   ALT (SGPT) Piccolo, Waived  Date Value Ref Range Status  03/28/2018 35 10 - 47 U/L Final         Passed - eGFR is 30 or above and within 360 days    GFR calc Af Amer  Date Value Ref Range Status  01/15/2020 89 >59  mL/min/1.73 Final    Comment:    **[facility] currently reports eGFR in compliance with the current**   recommendations of the [employer]. [facility] will   update reporting as new guidelines are published from the NKF-ASN   Task force.    GFR calc non Af Amer  Date Value Ref Range Status  01/15/2020 77 >59 mL/min/1.73 Final   eGFR  Date Value Ref Range Status  12/07/2021 54 (L) >59 mL/min/1.73 Final         Passed - Patient is not pregnant      Passed - Valid encounter within last 12 months    Recent Outpatient Visits           3 weeks ago Hypertension associated with diabetes [facility])   [facility] Jon Billings, NP   4 months ago Hypertension associated with diabetes [facility])   [facility] Jon Billings, NP   5 months ago Acute cough   [facility], [place],  NP   9 months ago Hypertension associated with diabetes [facility])   [facility] Jon Billings, NP   1 year ago Diabetes mellitus associated with hormonal etiology ([place])   [place], Scheryl Darter, NP       Future Appointments             In 1 month Jon Billings, NP [place], [facility]             allopurinol (ZYLOPRIM) 300 MG tablet [Pharmacy Med Name: ALLOPURINOL 300 MG Tablet] 90 tablet 1    Sig: TAKE 1 TABLET (300 MG TOTAL) BY MOUTH DAILY.     Endocrinology:  Gout Agents - allopurinol Failed - 12/28/2021  3:08 PM      Failed - Uric Acid in normal range and within 360 days    Uric Acid  Date Value Ref Range Status  07/28/2020 2.4 (L) 3.8 - 8.4 mg/dL Final    Comment:               Therapeutic target for gout patients: <6.0         Failed - Cr in normal range and within 360 days    Creatinine, Ser  Date Value Ref Range Status  12/07/2021 1.35 (H) 0.76 - 1.27 mg/dL Final   Creatinine, Urine  Date Value Ref Range Status  03/05/2019 82 mg/dL Final    Comment:    Performed at [facility], [address] [place], [place] [address]         Failed - CBC within normal limits and completed in the last 12 months    WBC  Date Value Ref Range Status  09/09/2020 8.8 3.4 - 10.8 x10E3/uL Final  03/10/2019 6.4 4.0 - 10.5 K/uL Final   RBC  Date Value Ref Range Status  09/09/2020 4.94 4.14 - 5.80 x10E6/uL Final  03/10/2019 4.14 (L) 4.22 - 5.81 MIL/uL Final   Hemoglobin  Date Value Ref Range Status  09/09/2020 15.6 13.0 - 17.7 g/dL Final   Hematocrit  Date Value Ref Range Status  09/09/2020 44.8 37.5 - 51.0 % Final   MCHC  Date Value Ref Range Status  09/09/2020 34.8 31.5 - 35.7 g/dL Final  03/10/2019 34.6 30.0 - 36.0 g/dL Final   [facility]  Date Value Ref Range Status  09/09/2020 31.6 26.6 - 33.0 pg Final  03/10/2019 31.4 26.0 - 34.0 pg Final   MCV  Date Value Ref Range Status  09/09/2020 91 79 - 97 fL Final   No results found for: "PLTCOUNTKUC", "LABPLAT", "POCPLA" RDW  Date Value Ref Range Status  09/09/2020 13.2 11.6 - 15.4 % Final         Passed - Valid encounter within last 12 months    Recent Outpatient Visits           3 weeks ago Hypertension associated with diabetes ([place])   [facility] Jon Billings, NP   4 months ago Hypertension associated with diabetes ([place])   [facility] Jon Billings, NP   5 months ago Acute cough   [facility] Kathrine Haddock, NP   9 months ago Hypertension associated with diabetes [facility])   [facility] Jon Billings, NP   1 year ago Diabetes mellitus associated with hormonal etiology ([place])   [place], Lauren A, NP       Future Appointments             In 1 month  Jon Billings, NP [facility], [place]

## 2021-12-30 ENCOUNTER — Other Ambulatory Visit: Payer: Self-pay

## 2021-12-30 NOTE — Telephone Encounter (Signed)
Most of these are not on his medication list or in the formulary for me to prescribe.  Is there an alternative?

## 2021-12-30 NOTE — Telephone Encounter (Signed)
[facility] requesting rx for  BD Single Use Swab True Metrix Level 1 CTRL SOLN [employer] True Metrix Air [employer] Metrix True Strip True Plus 33G Lancets  Please advise.

## 2022-01-02 NOTE — Progress Notes (Deleted)
   There were no vitals taken for this visit.   Subjective:    Patient ID: Carl Bryan, male    DOB: 11-21-1945, 76 y.o.   MRN: [id number]  HPI: Carl Bryan is a 76 y.o. male  No chief complaint on file.  UPPER RESPIRATORY TRACT INFECTION Worst symptom: Fever: {Blank single:19197::"yes","no"} Cough: {Blank single:19197::"yes","no"} Shortness of breath: {Blank single:19197::"yes","no"} Wheezing: {Blank single:19197::"yes","no"} Chest pain: {Blank single:19197::"yes","no","yes, with cough"} Chest tightness: {Blank single:19197::"yes","no"} Chest congestion: {Blank single:19197::"yes","no"} Nasal congestion: {Blank single:19197::"yes","no"} Runny nose: {Blank single:19197::"yes","no"} Post nasal drip: {Blank single:19197::"yes","no"} Sneezing: {Blank single:19197::"yes","no"} Sore throat: {Blank single:19197::"yes","no"} Swollen glands: {Blank single:19197::"yes","no"} Sinus pressure: {Blank single:19197::"yes","no"} Headache: {Blank single:19197::"yes","no"} Face pain: {Blank single:19197::"yes","no"} Toothache: {Blank single:19197::"yes","no"} Ear pain: {Blank single:19197::"yes","no"} {Blank single:19197::""right","left", "bilateral"} Ear pressure: {Blank single:19197::"yes","no"} {Blank single:19197::""right","left", "bilateral"} Eyes red/itching:{Blank single:19197::"yes","no"} Eye drainage/crusting: {Blank single:19197::"yes","no"}  Vomiting: {Blank single:19197::"yes","no"} Rash: {Blank single:19197::"yes","no"} Fatigue: {Blank single:19197::"yes","no"} Sick contacts: {Blank single:19197::"yes","no"} Strep contacts: {Blank single:19197::"yes","no"}  Context: {Blank multiple:19196::"better","worse","stable","fluctuating"} Recurrent sinusitis: {Blank single:19197::"yes","no"} Relief with OTC cold/cough medications: {Blank single:19197::"yes","no"}  Treatments attempted: {Blank multiple:19196::"none","cold/sinus","mucinex","anti-histamine","pseudoephedrine","cough  syrup","antibiotics"}   Relevant past medical, surgical, family and social history reviewed and updated as indicated. Interim medical history since our last visit reviewed. Allergies and medications reviewed and updated.  Review of Systems  Per HPI unless specifically indicated above     Objective:    There were no vitals taken for this visit.  Wt Readings from Last 3 Encounters:  07/26/21 156 lb (70.8 kg)  03/08/21 156 lb 8 oz (71 kg)  11/11/20 156 lb 3.2 oz (70.9 kg)    Physical Exam  Results for orders placed or performed in visit on 12/07/21  Comp Met (CMET)  Result Value Ref Range   Glucose 191 (H) 70 - 99 mg/dL   BUN 15 8 - 27 mg/dL   Creatinine, Ser 1.35 (H) 0.76 - 1.27 mg/dL   eGFR 54 (L) >59 mL/min/1.73   BUN/Creatinine Ratio 11 10 - 24   Sodium 139 134 - 144 mmol/L   Potassium 4.8 3.5 - 5.2 mmol/L   Chloride 102 96 - 106 mmol/L   CO2 19 (L) 20 - 29 mmol/L   Calcium 10.3 (H) 8.6 - 10.2 mg/dL   Total Protein 6.9 6.0 - 8.5 g/dL   Albumin 4.7 3.8 - 4.8 g/dL   Globulin, Total 2.2 1.5 - 4.5 g/dL   Albumin/Globulin Ratio 2.1 1.2 - 2.2   Bilirubin Total 0.5 0.0 - 1.2 mg/dL   Alkaline Phosphatase 75 44 - 121 IU/L   AST 16 0 - 40 IU/L   ALT 24 0 - 44 IU/L  HgB A1c  Result Value Ref Range   Hgb A1c MFr Bld 7.6 (H) 4.8 - 5.6 %   Est. average glucose Bld gHb Est-mCnc 171 mg/dL  Vitamin D (25 hydroxy)  Result Value Ref Range   Vit D, 25-Hydroxy 30.5 30.0 - 100.0 ng/mL  B12  Result Value Ref Range   Vitamin B-12 >2000 (H) 232 - 1245 pg/mL      Assessment & Plan:   Problem List Items Addressed This Visit   None    Follow up plan: No follow-ups on file.

## 2022-01-03 ENCOUNTER — Ambulatory Visit: Payer: Medicare PPO | Admitting: Nurse Practitioner

## 2022-01-03 NOTE — Progress Notes (Deleted)
   There were no vitals taken for this visit.   Subjective:    Patient ID: Carl Bryan, male    DOB: 04/23/1945, 75 y.o.   MRN: [id number]  HPI: Carl Bryan is a 75 y.o. male  No chief complaint on file.  UPPER RESPIRATORY TRACT INFECTION Worst symptom: Fever: {Blank single:19197::"yes","no"} Cough: {Blank single:19197::"yes","no"} Shortness of breath: {Blank single:19197::"yes","no"} Wheezing: {Blank single:19197::"yes","no"} Chest pain: {Blank single:19197::"yes","no","yes, with cough"} Chest tightness: {Blank single:19197::"yes","no"} Chest congestion: {Blank single:19197::"yes","no"} Nasal congestion: {Blank single:19197::"yes","no"} Runny nose: {Blank single:19197::"yes","no"} Post nasal drip: {Blank single:19197::"yes","no"} Sneezing: {Blank single:19197::"yes","no"} Sore throat: {Blank single:19197::"yes","no"} Swollen glands: {Blank single:19197::"yes","no"} Sinus pressure: {Blank single:19197::"yes","no"} Headache: {Blank single:19197::"yes","no"} Face pain: {Blank single:19197::"yes","no"} Toothache: {Blank single:19197::"yes","no"} Ear pain: {Blank single:19197::"yes","no"} {Blank single:19197::""right","left", "bilateral"} Ear pressure: {Blank single:19197::"yes","no"} {Blank single:19197::""right","left", "bilateral"} Eyes red/itching:{Blank single:19197::"yes","no"} Eye drainage/crusting: {Blank single:19197::"yes","no"}  Vomiting: {Blank single:19197::"yes","no"} Rash: {Blank single:19197::"yes","no"} Fatigue: {Blank single:19197::"yes","no"} Sick contacts: {Blank single:19197::"yes","no"} Strep contacts: {Blank single:19197::"yes","no"}  Context: {Blank multiple:19196::"better","worse","stable","fluctuating"} Recurrent sinusitis: {Blank single:19197::"yes","no"} Relief with OTC cold/cough medications: {Blank single:19197::"yes","no"}  Treatments attempted: {Blank multiple:19196::"none","cold/sinus","mucinex","anti-histamine","pseudoephedrine","cough  syrup","antibiotics"}   Relevant past medical, surgical, family and social history reviewed and updated as indicated. Interim medical history since our last visit reviewed. Allergies and medications reviewed and updated.  Review of Systems  Per HPI unless specifically indicated above     Objective:    There were no vitals taken for this visit.  Wt Readings from Last 3 Encounters:  07/26/21 156 lb (70.8 kg)  03/08/21 156 lb 8 oz (71 kg)  11/11/20 156 lb 3.2 oz (70.9 kg)    Physical Exam  Results for orders placed or performed in visit on 12/07/21  Comp Met (CMET)  Result Value Ref Range   Glucose 191 (H) 70 - 99 mg/dL   BUN 15 8 - 27 mg/dL   Creatinine, Ser 1.35 (H) 0.76 - 1.27 mg/dL   eGFR 54 (L) >59 mL/min/1.73   BUN/Creatinine Ratio 11 10 - 24   Sodium 139 134 - 144 mmol/L   Potassium 4.8 3.5 - 5.2 mmol/L   Chloride 102 96 - 106 mmol/L   CO2 19 (L) 20 - 29 mmol/L   Calcium 10.3 (H) 8.6 - 10.2 mg/dL   Total Protein 6.9 6.0 - 8.5 g/dL   Albumin 4.7 3.8 - 4.8 g/dL   Globulin, Total 2.2 1.5 - 4.5 g/dL   Albumin/Globulin Ratio 2.1 1.2 - 2.2   Bilirubin Total 0.5 0.0 - 1.2 mg/dL   Alkaline Phosphatase 75 44 - 121 IU/L   AST 16 0 - 40 IU/L   ALT 24 0 - 44 IU/L  HgB A1c  Result Value Ref Range   Hgb A1c MFr Bld 7.6 (H) 4.8 - 5.6 %   Est. average glucose Bld gHb Est-mCnc 171 mg/dL  Vitamin D (25 hydroxy)  Result Value Ref Range   Vit D, 25-Hydroxy 30.5 30.0 - 100.0 ng/mL  B12  Result Value Ref Range   Vitamin B-12 >2000 (H) 232 - 1245 pg/mL      Assessment & Plan:   Problem List Items Addressed This Visit   None    Follow up plan: No follow-ups on file.      

## 2022-01-03 NOTE — Telephone Encounter (Signed)
Spoke with [employer], states the accucheck patient was taking is now discontinued. They are requesting these as they are covered by insurance. Please advise.

## 2022-01-04 ENCOUNTER — Ambulatory Visit: Payer: Medicare PPO | Admitting: Nurse Practitioner

## 2022-01-04 MED ORDER — BD SWAB SINGLE USE REGULAR PADS: 1.0000 | MEDICATED_PAD | Freq: Three times a day (TID) | 1 refills | Status: AC

## 2022-01-04 MED ORDER — TRUE METRIX LEVEL 1 LOW VI SOLN: 1.0000 | 0 refills | Status: AC

## 2022-01-04 MED ORDER — TRUE METRIX LEVEL 3 HIGH VI SOLN: 1.0000 | 0 refills | Status: AC

## 2022-01-05 ENCOUNTER — Ambulatory Visit: Payer: Medicare PPO | Admitting: Physician Assistant

## 2022-01-05 ENCOUNTER — Ambulatory Visit
Admission: RE | Admit: 2022-01-05 | Discharge: 2022-01-05 | Disposition: A | Discharge status: Home / Self Care | Payer: Medicare PPO | Source: Home / Self Care | Attending: *Deleted | Admitting: *Deleted

## 2022-01-05 ENCOUNTER — Ambulatory Visit
Admission: RE | Admit: 2022-01-05 | Discharge: 2022-01-05 | Disposition: A | Discharge status: Home / Self Care | Payer: Medicare PPO | Source: Ambulatory Visit | Attending: Physician Assistant | Admitting: Physician Assistant

## 2022-01-05 ENCOUNTER — Encounter: Payer: Self-pay | Admitting: Physician Assistant

## 2022-01-05 VITALS — BP 138/70 | HR 83 | Temp 98.4°F | Ht 65.98 in | Wt 156.0 lb

## 2022-01-05 DIAGNOSIS — J449 Chronic obstructive pulmonary disease, unspecified: Secondary | ICD-10-CM | POA: Diagnosis not present

## 2022-01-05 DIAGNOSIS — J069 Acute upper respiratory infection, unspecified: Secondary | ICD-10-CM | POA: Insufficient documentation

## 2022-01-05 DIAGNOSIS — R059 Cough, unspecified: Secondary | ICD-10-CM | POA: Diagnosis not present

## 2022-01-05 DIAGNOSIS — R062 Wheezing: Secondary | ICD-10-CM

## 2022-01-05 MED ORDER — BENZONATATE 200 MG PO CAPS: 200.0000 mg | ORAL_CAPSULE | Freq: Two times a day (BID) | ORAL | 0 refills | Status: DC | PRN

## 2022-01-05 MED ORDER — PREDNISONE 20 MG PO TABS: 40.0000 mg | ORAL_TABLET | Freq: Every day | ORAL | 0 refills | Status: AC

## 2022-01-05 NOTE — Progress Notes (Signed)
Acute Office Visit   Patient: Carl Bryan   DOB: Jun 25, 1945   76 y.o. Male  MRN: [id number] Visit Date: 01/05/2022  Today's healthcare provider: Dani Gobble Stephaniemarie Stoffel, PA-C  Introduced myself to the patient as a Journalist, newspaper and provided education on APPs in clinical practice.    Chief Complaint  Patient presents with   Cough    B/l flank pain for the past week.    Subjective    Cough Associated symptoms include chills, rhinorrhea and a sore throat. Pertinent negatives include no ear pain, fever, headaches, myalgias, rash, shortness of breath or wheezing.   HPI     Cough    Additional comments: B/l flank pain for the past week.       Last edited by Jerelene Redden, CMA on 01/05/2022  1:12 PM.       Reports he is having coughing, side pain and muscle spasms Started last week States his sides hurt when he coughs Reports he is coughing all night and has been taking OTC cough medicine - provides relief for about 2 hours States he has felt reduced energy and fatigue as well.  Reports intermittent productive cough Reports his wife is also feeling sick Interventions: Tylenol and OTC cough syrup He has not tested for COVID at home  Reports he is having muscle spasms and stabbing pain at his amputation site He has above the knee amputation of right leg    Medications: Outpatient Medications Prior to Visit  Medication Sig   ACCU-CHEK AVIVA PLUS test strip Check 1 time a day   Alcohol Swabs (B-D SINGLE USE SWABS REGULAR) PADS 1 Application by Does not apply route in the morning, at noon, and at bedtime.   allopurinol (ZYLOPRIM) 300 MG tablet Take 1 tablet (300 mg total) by mouth daily.   amLODipine (NORVASC) 10 MG tablet Take 1 tablet (10 mg total) by mouth daily.   Blood Glucose Calibration (TRUE METRIX LEVEL 1) Low SOLN 1 Application by In Vitro route once a week.   Blood Glucose Calibration (TRUE METRIX LEVEL 3) High SOLN 1 Application by In Vitro route once a week.   Blood  Glucose Monitoring Suppl (ACCU-CHEK AVIVA PLUS) w/Device KIT Test 1 time daily   cyanocobalamin (VITAMIN B12) 1000 MCG tablet Take 1 tablet (1,000 mcg total) by mouth daily.   empagliflozin (JARDIANCE) 10 MG TABS tablet Take 1 tablet (10 mg total) by mouth daily before breakfast.   glipiZIDE (GLUCOTROL) 5 MG tablet Take 1 tablet (5 mg total) by mouth daily before breakfast.   glucose blood (ACCU-CHEK AVIVA PLUS) test strip 1 each by Other route daily. Use as instructed   losartan (COZAAR) 100 MG tablet Take 1 tablet (100 mg total) by mouth daily.   meloxicam (MOBIC) 15 MG tablet Take 1 tablet (15 mg total) by mouth daily.   metFORMIN (GLUCOPHAGE) 500 MG tablet TAKE 2 TABLETS TWICE DAILY WITH MEALS   pantoprazole (PROTONIX) 40 MG tablet Take 1 tablet (40 mg total) by mouth daily.   rosuvastatin (CRESTOR) 40 MG tablet Take 1 tablet (40 mg total) by mouth daily.   tamsulosin (FLOMAX) 0.4 MG CAPS capsule Take 1 capsule (0.4 mg total) by mouth daily.   Vitamin D, Cholecalciferol, 25 MCG (1000 UT) TABS Take 2,000 Units by mouth daily.   No facility-administered medications prior to visit.    Review of Systems  Constitutional:  Positive for chills and fatigue. Negative for fever.  HENT:  Positive for congestion, rhinorrhea and sore throat. Negative for ear discharge, ear pain, sinus pressure and sinus pain.   Respiratory:  Positive for cough. Negative for chest tightness, shortness of breath and wheezing.   Gastrointestinal:  Positive for diarrhea. Negative for nausea and vomiting.  Musculoskeletal:  Negative for myalgias, neck pain and neck stiffness.  Skin:  Negative for rash.  Neurological:  Positive for dizziness. Negative for headaches.       Objective    BP 138/70   Pulse 83   Temp 98.4 F (36.9 C) (Oral)   Ht 5' 5.98" (1.676 m)   Wt 156 lb (70.8 kg)   SpO2 97%   BMI 25.19 kg/m    Physical Exam Vitals reviewed.  Constitutional:      General: He is awake.     Appearance:  Normal appearance. He is well-developed, well-groomed and normal weight.  HENT:     Head: Normocephalic and atraumatic.     Right Ear: Decreased hearing noted. There is impacted cerumen.     Left Ear: Tympanic membrane normal. Decreased hearing noted.     Mouth/Throat:     Lips: Pink.     Mouth: Mucous membranes are moist.     Pharynx: Oropharynx is clear. Uvula midline. No pharyngeal swelling, oropharyngeal exudate, posterior oropharyngeal erythema or uvula swelling.     Tonsils: No tonsillar exudate or tonsillar abscesses.  Eyes:     General: Lids are normal. Gaze aligned appropriately.     Pupils: Pupils are equal, round, and reactive to light.  Cardiovascular:     Rate and Rhythm: Normal rate and regular rhythm.     Pulses: Normal pulses.          Radial pulses are 2+ on the right side and 2+ on the left side.     Heart sounds: Normal heart sounds.  Pulmonary:     Effort: Pulmonary effort is normal.     Breath sounds: Examination of the right-upper field reveals wheezing. Examination of the left-upper field reveals wheezing. Wheezing present. No decreased breath sounds, rhonchi or rales.  Musculoskeletal:     Cervical back: Normal range of motion and neck supple.  Lymphadenopathy:     Head:     Right side of head: No submental, submandibular or preauricular adenopathy.     Left side of head: No submental, submandibular or preauricular adenopathy.     Upper Body:     Right upper body: No supraclavicular adenopathy.     Left upper body: No supraclavicular adenopathy.  Neurological:     General: No focal deficit present.     Mental Status: He is alert and oriented to person, place, and time.     GCS: GCS eye subscore is 4. GCS verbal subscore is 5. GCS motor subscore is 6.     Cranial Nerves: No dysarthria or facial asymmetry.     Motor: Motor function is intact. No weakness or tremor.  Psychiatric:        Attention and Perception: Attention and perception normal.        Mood  and Affect: Mood and affect normal.        Speech: Speech normal.        Behavior: Behavior normal. Behavior is cooperative.       No results found for any visits on 01/05/22.  Assessment & Plan      No follow-ups on file.      Problem List Items Addressed This Visit  None Visit Diagnoses     Upper respiratory tract infection, unspecified type    -  Primary Acute, new concern Ongoing since last week Reports intermittently productive cough, congestion, chills, sore throat, fatigue Has been taking OTC cough medicine and Tylenol Suspect he is past therapeutic window for antivirals so no testing for COVID or flu at this time Recommend he continue with symptomatic management with OTC multi-symptom medication of choice Will send in tessalon perls to assist with coughing CXR to rule out pneumonia due to wheezing - CXR clear for pneumonia but evidence of COPD Will also send in Prednisone burst for assistance with breathing Follow up as needed for progressing or worsening symptoms    Relevant Medications   benzonatate (TESSALON) 200 MG capsule   Other Relevant Orders   DG Chest 2 View (Completed)   Wheezing     See URI A&P above for management plan   Relevant Medications   predniSONE (DELTASONE) 20 MG tablet   Other Relevant Orders   DG Chest 2 View (Completed)        No follow-ups on file.   I, Imane Burrough E Symia Herdt, PA-C, have reviewed all documentation for this visit. The documentation on 01/05/22 for the exam, diagnosis, procedures, and orders are all accurate and complete.   Talitha Givens, MHS, PA-C [facility]

## 2022-01-05 NOTE — Patient Instructions (Addendum)
Please go here for your  chest xray   [address], [place] [address]   Once I get your results I will send you a message and let you know if you need an antibiotic  Based on your described symptoms and the duration of symptoms it is likely that you have a viral upper respiratory infection (often called a "cold")  Symptoms can last for 3-10 days with lingering cough and intermittent symptoms lasting weeks after that.  The goal of treatment at this time is to reduce your symptoms and discomfort   I have sent in Tessalon pearls for you to take twice per day to help with your cough  You can use over the counter medications such as Dayquil/Nyquil, AlkaSeltzer formulations, etc to provide further relief of symptoms according to the manufacturer's instructions   If preferred you can use Coricidin to manage your symptoms rather than those medications mentioned above.    If your symptoms do not improve or become worse in the next 5-7 days please make an apt at the office so we can see you  Go to the ER if you begin to have more serious symptoms such as shortness of breath, trouble breathing, loss of consciousness, swelling around the eyes, high fever, severe lasting headaches, vision changes or neck pain/stiffness.

## 2022-01-19 ENCOUNTER — Ambulatory Visit: Payer: Medicare PPO | Admitting: Nurse Practitioner

## 2022-02-07 NOTE — Progress Notes (Unsigned)
There were no vitals taken for this visit.   Subjective:    Patient ID: Carl Bryan, male    DOB: 1945/05/12, 76 y.o.   MRN: [id number]  HPI: Carl Bryan is a 76 y.o. male  No chief complaint on file.  HYPERTENSION / HYPERLIPIDEMIA Satisfied with current treatment? yes Duration of hypertension: years BP monitoring frequency: sometimes BP range: 130/60 BP medication side effects: no Past BP meds: amlodipine and losartan (cozaar) Duration of hyperlipidemia: years Cholesterol medication side effects: no Cholesterol supplements: none Past cholesterol medications: rosuvastatin (crestor) Medication compliance: excellent compliance Aspirin: no Recent stressors: no Recurrent headaches: no Visual changes: no Palpitations: no Dyspnea: no Chest pain: no Lower extremity edema: no Dizzy/lightheaded: no  DIABETES Denies concerns during visit today.   Hypoglycemic episodes:no Polydipsia/polyuria: no Visual disturbance: no Chest pain: no Paresthesias: no Glucose Monitoring: yes  Accucheck frequency: Daily  Fasting glucose: 115 today  Post prandial:  Evening:  Before meals: Taking Insulin?: no  Long acting insulin:  Short acting insulin: Blood Pressure Monitoring: daily Retinal Examination: Up to Date- will get report Foot Exam: Not up to Date Diabetic Education: Not Completed Pneumovax: Up to Date Influenza: Up to Date Aspirin: no  Relevant past medical, surgical, family and social history reviewed and updated as indicated. Interim medical history since our last visit reviewed. Allergies and medications reviewed and updated.  Review of Systems  Eyes:  Negative for visual disturbance.  Respiratory:  Negative for chest tightness and shortness of breath.   Cardiovascular:  Negative for chest pain, palpitations and leg swelling.  Gastrointestinal:  Positive for diarrhea.  Endocrine: Negative for polydipsia and polyuria.  Neurological:  Negative for dizziness,  light-headedness, numbness and headaches.    Per HPI unless specifically indicated above     Objective:    There were no vitals taken for this visit.  Wt Readings from Last 3 Encounters:  01/05/22 156 lb (70.8 kg)  07/26/21 156 lb (70.8 kg)  03/08/21 156 lb 8 oz (71 kg)    Physical Exam Vitals and nursing note reviewed.  Constitutional:      General: He is not in acute distress.    Appearance: Normal appearance. He is not ill-appearing, toxic-appearing or diaphoretic.  HENT:     Head: Normocephalic.     Right Ear: External ear normal.     Left Ear: External ear normal.     Nose: Nose normal. No congestion or rhinorrhea.     Mouth/Throat:     Mouth: Mucous membranes are moist.  Eyes:     General:        Right eye: No discharge.        Left eye: No discharge.     Extraocular Movements: Extraocular movements intact.     Conjunctiva/sclera: Conjunctivae normal.     Pupils: Pupils are equal, round, and reactive to light.  Cardiovascular:     Rate and Rhythm: Normal rate and regular rhythm.     Heart sounds: No murmur heard. Pulmonary:     Effort: Pulmonary effort is normal. No respiratory distress.     Breath sounds: Normal breath sounds. No wheezing, rhonchi or rales.  Abdominal:     General: Abdomen is flat. Bowel sounds are normal.  Musculoskeletal:     Cervical back: Normal range of motion and neck supple.     Comments: Uses crutches due to L AKA.  Skin:    General: Skin is warm and dry.     Capillary Refill:  Capillary refill takes less than 2 seconds.  Neurological:     General: No focal deficit present.     Mental Status: He is alert and oriented to person, place, and time.  Psychiatric:        Mood and Affect: Mood normal.        Behavior: Behavior normal.        Thought Content: Thought content normal.        Judgment: Judgment normal.    Results for orders placed or performed in visit on 12/07/21  Comp Met (CMET)  Result Value Ref Range   Glucose 191  (H) 70 - 99 mg/dL   BUN 15 8 - 27 mg/dL   Creatinine, Ser 1.35 (H) 0.76 - 1.27 mg/dL   eGFR 54 (L) >59 mL/min/1.73   BUN/Creatinine Ratio 11 10 - 24   Sodium 139 134 - 144 mmol/L   Potassium 4.8 3.5 - 5.2 mmol/L   Chloride 102 96 - 106 mmol/L   CO2 19 (L) 20 - 29 mmol/L   Calcium 10.3 (H) 8.6 - 10.2 mg/dL   Total Protein 6.9 6.0 - 8.5 g/dL   Albumin 4.7 3.8 - 4.8 g/dL   Globulin, Total 2.2 1.5 - 4.5 g/dL   Albumin/Globulin Ratio 2.1 1.2 - 2.2   Bilirubin Total 0.5 0.0 - 1.2 mg/dL   Alkaline Phosphatase 75 44 - 121 IU/L   AST 16 0 - 40 IU/L   ALT 24 0 - 44 IU/L  HgB A1c  Result Value Ref Range   Hgb A1c MFr Bld 7.6 (H) 4.8 - 5.6 %   Est. average glucose Bld gHb Est-mCnc 171 mg/dL  Vitamin D (25 hydroxy)  Result Value Ref Range   Vit D, 25-Hydroxy 30.5 30.0 - 100.0 ng/mL  B12  Result Value Ref Range   Vitamin B-12 >2000 (H) 232 - 1245 pg/mL      Assessment & Plan:   Problem List Items Addressed This Visit   None    Follow up plan: No follow-ups on file.

## 2022-02-08 ENCOUNTER — Telehealth: Payer: Self-pay

## 2022-02-08 ENCOUNTER — Encounter: Payer: Self-pay | Admitting: Nurse Practitioner

## 2022-02-08 ENCOUNTER — Ambulatory Visit (INDEPENDENT_AMBULATORY_CARE_PROVIDER_SITE_OTHER): Payer: Medicare PPO | Admitting: Nurse Practitioner

## 2022-02-08 VITALS — BP 137/63 | HR 61 | Temp 97.8°F | Wt 163.1 lb

## 2022-02-08 DIAGNOSIS — I2510 Atherosclerotic heart disease of native coronary artery without angina pectoris: Secondary | ICD-10-CM | POA: Diagnosis not present

## 2022-02-08 DIAGNOSIS — I700 Atherosclerosis of aorta: Secondary | ICD-10-CM | POA: Diagnosis not present

## 2022-02-08 DIAGNOSIS — I2583 Coronary atherosclerosis due to lipid rich plaque: Secondary | ICD-10-CM | POA: Diagnosis not present

## 2022-02-08 DIAGNOSIS — E1159 Type 2 diabetes mellitus with other circulatory complications: Secondary | ICD-10-CM

## 2022-02-08 DIAGNOSIS — Z23 Encounter for immunization: Secondary | ICD-10-CM | POA: Diagnosis not present

## 2022-02-08 DIAGNOSIS — I152 Hypertension secondary to endocrine disorders: Secondary | ICD-10-CM | POA: Diagnosis not present

## 2022-02-08 DIAGNOSIS — E1169 Type 2 diabetes mellitus with other specified complication: Secondary | ICD-10-CM | POA: Diagnosis not present

## 2022-02-08 DIAGNOSIS — S78111D Complete traumatic amputation at level between right hip and knee, subsequent encounter: Secondary | ICD-10-CM

## 2022-02-08 DIAGNOSIS — E559 Vitamin D deficiency, unspecified: Secondary | ICD-10-CM

## 2022-02-08 DIAGNOSIS — E538 Deficiency of other specified B group vitamins: Secondary | ICD-10-CM

## 2022-02-08 DIAGNOSIS — E785 Hyperlipidemia, unspecified: Secondary | ICD-10-CM | POA: Diagnosis not present

## 2022-02-08 LAB — MICROALBUMIN, URINE WAIVED
Creatinine, Urine Waived: 100 mg/dL (ref 10–300)
Microalb, Ur Waived: 150 mg/L — ABNORMAL HIGH (ref 0–19)
Microalb/Creat Ratio: >300 mg/g — ABNORMAL HIGH (ref <30)

## 2022-02-08 MED ORDER — ONETOUCH VERIO W/DEVICE KIT: 1.0000 | PACK | Freq: Once | 0 refills | Status: AC

## 2022-02-08 MED ORDER — ONETOUCH VERIO VI STRP: 1.0000 | ORAL_STRIP | Freq: Two times a day (BID) | 12 refills | Status: DC

## 2022-02-08 NOTE — Assessment & Plan Note (Addendum)
Chronic.  Pt reports not taking jardiance as he has had issues obtaining from Pharmacy. Will f/u with pharmacy on refilling jardiance and obtaining new glucometer for home BG checks. A1C drawn today to assess change and need for change in medication regimen.  Low carb diet stressed at today's visit. Annual foot exam completed. Diabetic eye exam appt this month per pt. Recommended podiatry for nail/foot care, but pt has family member that he gets assistance from to care for his feet/nails.

## 2022-02-08 NOTE — Telephone Encounter (Signed)
Entered in error

## 2022-02-08 NOTE — Assessment & Plan Note (Addendum)
Chronic, stable. Assess cholesterol level today. Stressed low fat diet and continuing to take crestor as prescribed. Pulses palpable +2 BUE, +1 DP/PT to LLE.

## 2022-02-08 NOTE — Assessment & Plan Note (Signed)
Chronic, stable. No changes to current medication regimen.

## 2022-02-08 NOTE — Assessment & Plan Note (Signed)
Chronic.  No acute CP/palpitations.

## 2022-02-08 NOTE — Assessment & Plan Note (Signed)
Chronic.  Assess lipid levels today. Will make recommendations based on lipid panel today.  No changes at this time.

## 2022-02-08 NOTE — Assessment & Plan Note (Signed)
Stable. No acute pain/discomfort.

## 2022-02-09 LAB — COMPREHENSIVE METABOLIC PANEL
ALT: 20 IU/L (ref 0–44)
AST: 12 IU/L (ref 0–40)
Albumin/Globulin Ratio: 2.4 — ABNORMAL HIGH (ref 1.2–2.2)
Albumin: 4.7 g/dL (ref 3.8–4.8)
Alkaline Phosphatase: 98 IU/L (ref 44–121)
BUN/Creatinine Ratio: 14 (ref 10–24)
BUN: 16 mg/dL (ref 8–27)
Bilirubin Total: 0.5 mg/dL (ref 0.0–1.2)
CO2: 21 mmol/L (ref 20–29)
Calcium: 9.7 mg/dL (ref 8.6–10.2)
Chloride: 101 mmol/L (ref 96–106)
Creatinine, Ser: 1.15 mg/dL (ref 0.76–1.27)
Globulin, Total: 2 g/dL (ref 1.5–4.5)
Glucose: 201 mg/dL — ABNORMAL HIGH (ref 70–99)
Potassium: 4.4 mmol/L (ref 3.5–5.2)
Sodium: 138 mmol/L (ref 134–144)
Total Protein: 6.7 g/dL (ref 6.0–8.5)
eGFR: 66 mL/min/{1.73_m2} (ref >59)

## 2022-02-09 LAB — LIPID PANEL
Chol/HDL Ratio: 3.3 ratio (ref 0.0–5.0)
Cholesterol, Total: 128 mg/dL (ref 100–199)
HDL: 39 mg/dL — ABNORMAL LOW (ref >39)
LDL Chol Calc (NIH): 64 mg/dL (ref 0–99)
Triglycerides: 141 mg/dL (ref 0–149)
VLDL Cholesterol Cal: 25 mg/dL (ref 5–40)

## 2022-02-09 LAB — HEMOGLOBIN A1C
Est. average glucose Bld gHb Est-mCnc: 163 mg/dL
Hgb A1c MFr Bld: 7.3 % — ABNORMAL HIGH (ref 4.8–5.6)

## 2022-02-09 NOTE — Progress Notes (Signed)
Please let patient know that his lab work looks good.  A1c improved to 7.3 which is great news.  Keep up the good work.  Let him know that I sent all new diabetic testing supplies to the pharmacy.  Please let us know if he has trouble getting them.

## 2022-02-14 ENCOUNTER — Telehealth: Payer: Self-pay | Admitting: Nurse Practitioner

## 2022-02-14 NOTE — Telephone Encounter (Signed)
Patient checking on the status of medication, patient does not know the name or why medication is being prescribed. All he knows is at the last [facility] PCP advised him she would be prescribing a medication, patient seeking clarity.     [place], [place] - [id number] [place] [address] [place] Phone: [phone]  Fax: [phone]

## 2022-02-15 ENCOUNTER — Telehealth: Payer: Self-pay | Admitting: Nurse Practitioner

## 2022-02-15 NOTE — Telephone Encounter (Signed)
Spoke with pharmacy staff at [facility], they looked up the tracking #, they told me that he should receive his diabetic supplies today no later than this afternoon. Spoke with patient, he has been made aware, no further questions.

## 2022-02-15 NOTE — Telephone Encounter (Signed)
Copied from CRM [id number]. Topic: General - Inquiry >> Feb 15, 2022  8:46 AM Marlow Baars wrote: Reason for CRM: The spouse of the patient called in stating they still haven't heard from [facility] pharmacy regarding the diabetic supplies you sent in a  week ago. She received a letter stating for whatever reason they didn't get the prescription. The spouse also said she wasn't at the appt but the patient told her he told the provider to send it to [facility] or mail order and not to the [facility] on file. Please assist patient futher

## 2022-02-15 NOTE — Telephone Encounter (Signed)
Spoke with patient, advised him that it should be Jardiance and [facility] pharmacy will be sending this in at the end of the month since it is too early to fill. He filled Jardiance through [facility] [employer] which is why he cannot fill until end of the month. Per K. [place] advise, told patient he may restart jardiance once [facility] sends him the jardiance. Patient has voiced understanding.

## 2022-02-16 DIAGNOSIS — E119 Type 2 diabetes mellitus without complications: Secondary | ICD-10-CM | POA: Diagnosis not present

## 2022-02-16 LAB — HM DIABETES EYE EXAM

## 2022-03-15 ENCOUNTER — Other Ambulatory Visit: Payer: Self-pay | Admitting: Nurse Practitioner

## 2022-03-15 DIAGNOSIS — E1169 Type 2 diabetes mellitus with other specified complication: Secondary | ICD-10-CM

## 2022-03-15 NOTE — Telephone Encounter (Signed)
Unable to refill per protocol, Rx request is too soon, last refill 12/07/21 for 90 and 1 refill. Will refuse.  Requested Prescriptions  Pending Prescriptions Disp Refills   metFORMIN (GLUCOPHAGE) 500 MG tablet [Pharmacy Med Name: METFORMIN HYDROCHLORIDE 500 MG Tablet] 360 tablet 3    Sig: TAKE 2 TABLETS TWICE DAILY WITH MEALS     Endocrinology:  Diabetes - Biguanides Failed - 03/15/2022  4:53 AM      Failed - B12 Level in normal range and within 720 days    Vitamin B-12  Date Value Ref Range Status  12/07/2021 >2000 (H) 232 - 1245 pg/mL Final         Failed - CBC within normal limits and completed in the last 12 months    WBC  Date Value Ref Range Status  09/09/2020 8.8 3.4 - 10.8 x10E3/uL Final  03/10/2019 6.4 4.0 - 10.5 K/uL Final   RBC  Date Value Ref Range Status  09/09/2020 4.94 4.14 - 5.80 x10E6/uL Final  03/10/2019 4.14 (L) 4.22 - 5.81 MIL/uL Final   Hemoglobin  Date Value Ref Range Status  09/09/2020 15.6 13.0 - 17.7 g/dL Final   Hematocrit  Date Value Ref Range Status  09/09/2020 44.8 37.5 - 51.0 % Final   MCHC  Date Value Ref Range Status  09/09/2020 34.8 31.5 - 35.7 g/dL Final  03/10/2019 34.6 30.0 - 36.0 g/dL Final   [facility]  Date Value Ref Range Status  09/09/2020 31.6 26.6 - 33.0 pg Final  03/10/2019 31.4 26.0 - 34.0 pg Final   MCV  Date Value Ref Range Status  09/09/2020 91 79 - 97 fL Final   No results found for: "PLTCOUNTKUC", "LABPLAT", "POCPLA" RDW  Date Value Ref Range Status  09/09/2020 13.2 11.6 - 15.4 % Final         Passed - Cr in normal range and within 360 days    Creatinine, Ser  Date Value Ref Range Status  02/08/2022 1.15 0.76 - 1.27 mg/dL Final   Creatinine, Urine  Date Value Ref Range Status  03/05/2019 82 mg/dL Final    Comment:    Performed at [facility], [address] [place], [place] [address]         Passed - HBA1C is between 0 and 7.9 and within 180 days    HB A1C (BAYER DCA - WAIVED)  Date Value Ref  Range Status  07/28/2020 7.5 (H) <7.0 % Final    Comment:                                          Diabetic Adult            <7.0                                       Healthy Adult        4.3 - 5.7                                                           (DCCT/NGSP) American Diabetes Association's Summary of Glycemic Recommendations for Adults with Diabetes: Hemoglobin  A1c <7.0%. More stringent glycemic goals (A1c <6.0%) may further reduce complications at the cost of increased risk of hypoglycemia.    Hgb A1c MFr Bld  Date Value Ref Range Status  02/08/2022 7.3 (H) 4.8 - 5.6 % Final    Comment:             Prediabetes: 5.7 - 6.4          Diabetes: >6.4          Glycemic control for adults with diabetes: <7.0          Passed - eGFR in normal range and within 360 days    GFR calc Af Amer  Date Value Ref Range Status  01/15/2020 89 >59 mL/min/1.73 Final    Comment:    **[facility] currently reports eGFR in compliance with the current**   recommendations of the [employer]. [facility] will   update reporting as new guidelines are published from the NKF-ASN   Task force.    GFR calc non Af Amer  Date Value Ref Range Status  01/15/2020 77 >59 mL/min/1.73 Final   eGFR  Date Value Ref Range Status  02/08/2022 66 >59 mL/min/1.73 Final         Passed - Valid encounter within last 6 months    Recent Outpatient Visits           1 month ago Hypertension associated with diabetes ([place])   [facility] Jon Billings, NP   2 months ago Upper respiratory tract infection, unspecified type   [employer], Erin E, PA-C   3 months ago Hypertension associated with diabetes ([place])   [facility] Jon Billings, NP   6 months ago Hypertension associated with diabetes [facility])   [facility] Jon Billings, NP   7 months ago Acute cough   [facility] Kathrine Haddock, NP       Future Appointments              In 1 month Jon Billings, NP [facility], [place]

## 2022-05-10 NOTE — Progress Notes (Unsigned)
There were no vitals taken for this visit.   Subjective:    Patient ID: Carl Bryan, male    DOB: June 30, 1945, 77 y.o.   MRN: [id number]  HPI: Carl Bryan is a 77 y.o. male    No chief complaint on file.  HYPERTENSION / HYPERLIPIDEMIA Satisfied with current treatment? yes Duration of hypertension: years BP monitoring frequency: sometimes BP range: 130/60 BP medication side effects: no Past BP meds: amlodipine and losartan (cozaar) Duration of hyperlipidemia: years Cholesterol medication side effects: no Cholesterol supplements: none Past cholesterol medications: rosuvastatin (crestor) Medication compliance: excellent compliance Aspirin: no Recent stressors: no Recurrent headaches: no Visual changes: no Palpitations: no Dyspnea: no Chest pain: no Lower extremity edema: no Dizzy/lightheaded: no  DIABETES Denies concerns during visit today.  Takes metformin and glipizide, has not been taking jardiance recently, has had issues with obtaining a prescription refill.  Hypoglycemic episodes:no Polydipsia/polyuria: no Visual disturbance: no Chest pain: no Paresthesias: no Glucose Monitoring: yes  Accucheck frequency: Daily; sending new prescription for a new glucometer and kit  Fasting glucose: 139  Post prandial:  Evening:  Before meals: Taking Insulin?: no  Long acting insulin:  Short acting insulin: Blood Pressure Monitoring: daily Retinal Examination: Has an appt this month;  Foot Exam: Completed today; needs to make an appt for nail cutting.  Diabetic Education: Completed today Pneumovax: Up to Date Influenza: Completed today Aspirin: no  Relevant past medical, surgical, family and social history reviewed and updated as indicated. Interim medical history since our last visit reviewed. Allergies and medications reviewed and updated.  Review of Systems  Constitutional:  Negative for activity change, appetite change, chills, fever and unexpected weight change.   Eyes:  Negative for visual disturbance.  Respiratory:  Negative for cough, chest tightness and shortness of breath.   Cardiovascular:  Negative for chest pain, palpitations and leg swelling.  Gastrointestinal:  Negative for diarrhea.  Endocrine: Negative for polydipsia and polyuria.  Musculoskeletal:  Negative for joint swelling.  Neurological:  Negative for dizziness, light-headedness, numbness and headaches.  Psychiatric/Behavioral:  Negative for sleep disturbance and suicidal ideas. The patient is not nervous/anxious.     Per HPI unless specifically indicated above     Objective:    There were no vitals taken for this visit.  Wt Readings from Last 3 Encounters:  02/08/22 163 lb 1.6 oz (74 kg)  01/05/22 156 lb (70.8 kg)  07/26/21 156 lb (70.8 kg)    Physical Exam Vitals and nursing note reviewed.  Constitutional:      General: He is not in acute distress.    Appearance: Normal appearance. He is not ill-appearing, toxic-appearing or diaphoretic.  HENT:     Nose: No congestion or rhinorrhea.  Eyes:     General:        Right eye: No discharge.        Left eye: No discharge.     Pupils: Pupils are equal, round, and reactive to light.  Cardiovascular:     Rate and Rhythm: Normal rate. Rhythm irregular.     Pulses:          Dorsalis pedis pulses are 1+ on the left side.       Posterior tibial pulses are 1+ on the left side.     Heart sounds: No murmur heard. Pulmonary:     Effort: Pulmonary effort is normal. No respiratory distress.     Breath sounds: Normal breath sounds. No wheezing, rhonchi or rales.  Musculoskeletal:  Cervical back: Normal range of motion and neck supple.     Comments: Uses crutches due to R AKA.     Right Lower Extremity: Right leg is amputated above knee.  Feet:     Left foot:     Protective Sensation: 10 sites tested.  10 sites sensed.     Skin integrity: Dry skin present. No skin breakdown or warmth.     Toenail Condition: Left toenails are  abnormally thick. Fungal disease present. Skin:    General: Skin is warm and dry.     Capillary Refill: Capillary refill takes less than 2 seconds.  Neurological:     General: No focal deficit present.     Mental Status: He is alert and oriented to person, place, and time.  Psychiatric:        Mood and Affect: Mood normal.        Behavior: Behavior normal.        Thought Content: Thought content normal.        Judgment: Judgment normal.     Results for orders placed or performed in visit on 02/21/22  HM DIABETES EYE EXAM  Result Value Ref Range   HM Diabetic Eye Exam No Retinopathy No Retinopathy      Assessment & Plan:   Problem List Items Addressed This Visit       Cardiovascular and Mediastinum   Hypertension associated with diabetes ([place])   CAD (coronary artery disease)   Aortic atherosclerosis ([place]) - Primary     Endocrine   Hyperlipidemia associated with type 2 diabetes mellitus ([place])   Diabetes mellitus associated with hormonal etiology ([place])     Other   Vitamin D deficiency   B12 deficiency     Follow up plan: No follow-ups on file.

## 2022-05-11 ENCOUNTER — Encounter: Payer: Self-pay | Admitting: Nurse Practitioner

## 2022-05-11 ENCOUNTER — Ambulatory Visit (INDEPENDENT_AMBULATORY_CARE_PROVIDER_SITE_OTHER): Payer: Medicare PPO | Admitting: Nurse Practitioner

## 2022-05-11 VITALS — BP 150/68 | HR 53 | Temp 97.8°F | Wt 164.6 lb

## 2022-05-11 DIAGNOSIS — E1169 Type 2 diabetes mellitus with other specified complication: Secondary | ICD-10-CM

## 2022-05-11 DIAGNOSIS — I2510 Atherosclerotic heart disease of native coronary artery without angina pectoris: Secondary | ICD-10-CM | POA: Diagnosis not present

## 2022-05-11 DIAGNOSIS — E559 Vitamin D deficiency, unspecified: Secondary | ICD-10-CM

## 2022-05-11 DIAGNOSIS — E785 Hyperlipidemia, unspecified: Secondary | ICD-10-CM

## 2022-05-11 DIAGNOSIS — I152 Hypertension secondary to endocrine disorders: Secondary | ICD-10-CM

## 2022-05-11 DIAGNOSIS — I700 Atherosclerosis of aorta: Secondary | ICD-10-CM

## 2022-05-11 DIAGNOSIS — E538 Deficiency of other specified B group vitamins: Secondary | ICD-10-CM

## 2022-05-11 DIAGNOSIS — E1159 Type 2 diabetes mellitus with other circulatory complications: Secondary | ICD-10-CM

## 2022-05-11 DIAGNOSIS — I2583 Coronary atherosclerosis due to lipid rich plaque: Secondary | ICD-10-CM

## 2022-05-11 NOTE — Assessment & Plan Note (Signed)
Chronic.  Last A1c was 7.3.  Will check labs at visit today. Still taking Metformin 500mg  BID.  Continue with current medication regimen.  Labs ordered today.  Return to clinic in 3 months for reevaluation.  Call sooner if concerns arise.

## 2022-05-11 NOTE — Assessment & Plan Note (Signed)
Labs ordered at visit today.  Will make recommendations based on lab results.   

## 2022-05-11 NOTE — Assessment & Plan Note (Signed)
Chronic.  Controlled.  Continue with current medication regimen on Crestor 40mg .  Labs ordered today.  Return to clinic in 3 months for reevaluation.  Call sooner if concerns arise.

## 2022-05-11 NOTE — Assessment & Plan Note (Signed)
Chronic.  Under good control on current regimen. Continue current regimen of Crestor 40mg . Continue to monitor. Labs drawn today.  Follow up in 3 months.  Call sooner if concerns arise.

## 2022-05-11 NOTE — Assessment & Plan Note (Signed)
Chronic.  Controlled.  Continue with current medication regimen of Crestor 40mg .   Labs ordered today.  Return to clinic in 6 months for reevaluation.  Call sooner if concerns arise.

## 2022-05-11 NOTE — Assessment & Plan Note (Signed)
Chronic.  Not well controlled.  Recommend checking blood pressure at home and bring log to  next visit.  Continue with current medication regimen.  Labs ordered today.  Return to clinic in 1 months for reevaluation.  Call sooner if concerns arise.

## 2022-05-12 LAB — VITAMIN D 25 HYDROXY (VIT D DEFICIENCY, FRACTURES): Vit D, 25-Hydroxy: 31.5 ng/mL (ref 30.0–100.0)

## 2022-05-12 LAB — COMPREHENSIVE METABOLIC PANEL
ALT: 19 IU/L (ref 0–44)
AST: 19 IU/L (ref 0–40)
Albumin/Globulin Ratio: 2.3 — ABNORMAL HIGH (ref 1.2–2.2)
Albumin: 4.9 g/dL — ABNORMAL HIGH (ref 3.8–4.8)
Alkaline Phosphatase: 73 IU/L (ref 44–121)
BUN/Creatinine Ratio: 14 (ref 10–24)
BUN: 16 mg/dL (ref 8–27)
Bilirubin Total: 0.5 mg/dL (ref 0.0–1.2)
CO2: 21 mmol/L (ref 20–29)
Calcium: 10.5 mg/dL — ABNORMAL HIGH (ref 8.6–10.2)
Chloride: 103 mmol/L (ref 96–106)
Creatinine, Ser: 1.15 mg/dL (ref 0.76–1.27)
Globulin, Total: 2.1 g/dL (ref 1.5–4.5)
Glucose: 140 mg/dL — ABNORMAL HIGH (ref 70–99)
Potassium: 4.8 mmol/L (ref 3.5–5.2)
Sodium: 139 mmol/L (ref 134–144)
Total Protein: 7 g/dL (ref 6.0–8.5)
eGFR: 65 mL/min/{1.73_m2} (ref >59)

## 2022-05-12 LAB — VITAMIN B12: Vitamin B-12: 1396 pg/mL — ABNORMAL HIGH (ref 232–1245)

## 2022-05-12 LAB — HEMOGLOBIN A1C
Est. average glucose Bld gHb Est-mCnc: 148 mg/dL
Hgb A1c MFr Bld: 6.8 % — ABNORMAL HIGH (ref 4.8–5.6)

## 2022-05-12 NOTE — Progress Notes (Signed)
Pleas elet aptietn know that his lab work looks good.  B12 is still above the normal range and does not need additional supplementation.  His A1c improved to 6.8 which is great news.  Keep up the good work.  Continue with current medication regimen.  Follow up as discussed.

## 2022-05-30 ENCOUNTER — Other Ambulatory Visit: Payer: Self-pay | Admitting: Nurse Practitioner

## 2022-05-31 NOTE — Telephone Encounter (Signed)
Requested Prescriptions  Pending Prescriptions Disp Refills   CHOLECALCIFEROL 25 MCG (1000 UNIT) tablet [Pharmacy Med Name: VITAMIN D3 25 MCG (1000 UT) Tablet] 180 tablet 3    Sig: TAKE 2 [place] DAY     Endocrinology:  Vitamins - Vitamin D Supplementation 2 Failed - 05/30/2022  9:43 AM      Failed - Manual Review: Route requests for 50,000 IU strength to the provider      Failed - Ca in normal range and within 360 days    Calcium  Date Value Ref Range Status  05/11/2022 10.5 (H) 8.6 - 10.2 mg/dL Final         Passed - Vitamin D in normal range and within 360 days    Vit D, 25-Hydroxy  Date Value Ref Range Status  05/11/2022 31.5 30.0 - 100.0 ng/mL Final    Comment:    Vitamin D deficiency has been defined by the Institute of Medicine and an Endocrine Society practice guideline as a level of serum 25-OH vitamin D less than 20 ng/mL (1,2). The Endocrine Society went on to further define vitamin D insufficiency as a level between 21 and 29 ng/mL (2). 1. IOM (Institute of Medicine). 2010. Dietary reference    intakes for calcium and D. [place]: The    [employer]. 2. Holick MF, Binkley [place], Bischoff-Ferrari HA, et al.    Evaluation, treatment, and prevention of vitamin D    deficiency: an Endocrine Society clinical practice    guideline. JCEM. 2011 Jul; 96(7):1911-30.          Passed - Valid encounter within last 12 months    Recent Outpatient Visits           2 weeks ago Aortic atherosclerosis [facility])   [place] Jon Billings, NP   3 months ago Hypertension associated with diabetes [facility])   [place], NP   4 months ago Upper respiratory tract infection, unspecified type   [place] [facility] Mecum, Erin E, PA-C   5 months ago Hypertension associated with diabetes [facility])   [place], Karen, NP   9 months ago Hypertension  associated with diabetes [facility])   [place] Jon Billings, NP       Future Appointments             In 1 week Jon Billings, NP [place], [facility]

## 2022-06-12 NOTE — Progress Notes (Unsigned)
   There were no vitals taken for this visit.   Subjective:    Patient ID: Carl Bryan, male    DOB: 11-Jul-1945, 77 y.o.   MRN: [id number]  HPI: Carl Bryan is a 76 y.o. male  No chief complaint on file.  HYPERTENSION {Blank single:19197::"without","with"} Chronic Kidney Disease Hypertension status: {Blank single:19197::"controlled","uncontrolled","better","worse","exacerbated","stable"}  Satisfied with current treatment? {Blank single:19197::"yes","no"} Duration of hypertension: {Blank single:19197::"chronic","months","years"} BP monitoring frequency:  {Blank single:19197::"not checking","rarely","daily","weekly","monthly","a few times a day","a few times a week","a few times a month"} BP range:  BP medication side effects:  {Blank single:19197::"yes","no"} Medication compliance: {Blank single:19197::"excellent compliance","good compliance","fair compliance","poor compliance"} Previous BP meds:{Blank [id number] (bystolic)","carvedilol","chlorthalidone","clonidine","diltiazem","exforge HCT","HCTZ","irbesartan (avapro)","labetalol","lisinopril","lisinopril-HCTZ","losartan (cozaar)","methyldopa","nifedipine","olmesartan (benicar)","olmesartan-HCTZ","quinapril","ramipril","spironalactone","tekturna","valsartan","valsartan-HCTZ","verapamil"} Aspirin: {Blank single:19197::"yes","no"} Recurrent headaches: {Blank single:19197::"yes","no"} Visual changes: {Blank single:19197::"yes","no"} Palpitations: {Blank single:19197::"yes","no"} Dyspnea: {Blank single:19197::"yes","no"} Chest pain: {Blank single:19197::"yes","no"} Lower extremity edema: {Blank single:19197::"yes","no"} Dizzy/lightheaded: {Blank single:19197::"yes","no"}   Relevant past medical, surgical, family and social history reviewed and updated as indicated. Interim medical history since our last visit reviewed. Allergies and  medications reviewed and updated.  Review of Systems  Per HPI unless specifically indicated above     Objective:    There were no vitals taken for this visit.  Wt Readings from Last 3 Encounters:  05/11/22 164 lb 9.6 oz (74.7 kg)  02/08/22 163 lb 1.6 oz (74 kg)  01/05/22 156 lb (70.8 kg)    Physical Exam  Results for orders placed or performed in visit on 05/11/22  Comp Met (CMET)  Result Value Ref Range   Glucose 140 (H) 70 - 99 mg/dL   BUN 16 8 - 27 mg/dL   Creatinine, Ser 1.15 0.76 - 1.27 mg/dL   eGFR 65 >59 mL/min/1.73   BUN/Creatinine Ratio 14 10 - 24   Sodium 139 134 - 144 mmol/L   Potassium 4.8 3.5 - 5.2 mmol/L   Chloride 103 96 - 106 mmol/L   CO2 21 20 - 29 mmol/L   Calcium 10.5 (H) 8.6 - 10.2 mg/dL   Total Protein 7.0 6.0 - 8.5 g/dL   Albumin 4.9 (H) 3.8 - 4.8 g/dL   Globulin, Total 2.1 1.5 - 4.5 g/dL   Albumin/Globulin Ratio 2.3 (H) 1.2 - 2.2   Bilirubin Total 0.5 0.0 - 1.2 mg/dL   Alkaline Phosphatase 73 44 - 121 IU/L   AST 19 0 - 40 IU/L   ALT 19 0 - 44 IU/L  HgB A1c  Result Value Ref Range   Hgb A1c MFr Bld 6.8 (H) 4.8 - 5.6 %   Est. average glucose Bld gHb Est-mCnc 148 mg/dL  Vitamin D (25 hydroxy)  Result Value Ref Range   Vit D, 25-Hydroxy 31.5 30.0 - 100.0 ng/mL  B12  Result Value Ref Range   Vitamin B-12 1,396 (H) 232 - 1,245 pg/mL      Assessment & Plan:   Problem List Items Addressed This Visit   None    Follow up plan: No follow-ups on file.

## 2022-06-13 ENCOUNTER — Encounter: Payer: Self-pay | Admitting: Nurse Practitioner

## 2022-06-13 ENCOUNTER — Ambulatory Visit (INDEPENDENT_AMBULATORY_CARE_PROVIDER_SITE_OTHER): Payer: Medicare PPO | Admitting: Nurse Practitioner

## 2022-06-13 VITALS — BP 125/67 | HR 56 | Temp 97.9°F | Wt 165.3 lb

## 2022-06-13 DIAGNOSIS — E1159 Type 2 diabetes mellitus with other circulatory complications: Secondary | ICD-10-CM | POA: Diagnosis not present

## 2022-06-13 DIAGNOSIS — I152 Hypertension secondary to endocrine disorders: Secondary | ICD-10-CM | POA: Diagnosis not present

## 2022-06-13 NOTE — Assessment & Plan Note (Signed)
Chronic.  Controlled.  Continue with current medication regimen of Losartan and Amlodipine.  Continue to check blood pressures at home and bring log to next visit.  Return to clinic in 2 months for reevaluation.  Call sooner if concerns arise.

## 2022-07-09 ENCOUNTER — Other Ambulatory Visit: Payer: Self-pay | Admitting: Nurse Practitioner

## 2022-07-09 DIAGNOSIS — E1169 Type 2 diabetes mellitus with other specified complication: Secondary | ICD-10-CM

## 2022-07-10 NOTE — Telephone Encounter (Signed)
Requested medications are due for refill today.  yes  Requested medications are on the active medications list.  yes  Last refill. 12/07/2021 #90 1 rf  Future visit scheduled.   yes  Notes to clinic.  Labs are expired.    Requested Prescriptions  Pending Prescriptions Disp Refills   allopurinol (ZYLOPRIM) 300 MG tablet [Pharmacy Med Name: ALLOPURINOL 300 MG Tablet] 90 tablet 3    Sig: TAKE 1 TABLET EVERY DAY     Endocrinology:  Gout Agents - allopurinol Failed - 07/09/2022  3:26 AM      Failed - Uric Acid in normal range and within 360 days    Uric Acid  Date Value Ref Range Status  07/28/2020 2.4 (L) 3.8 - 8.4 mg/dL Final    Comment:               Therapeutic target for gout patients: <6.0         Failed - CBC within normal limits and completed in the last 12 months    WBC  Date Value Ref Range Status  09/09/2020 8.8 3.4 - 10.8 x10E3/uL Final  03/10/2019 6.4 4.0 - 10.5 K/uL Final   RBC  Date Value Ref Range Status  09/09/2020 4.94 4.14 - 5.80 x10E6/uL Final  03/10/2019 4.14 (L) 4.22 - 5.81 MIL/uL Final   Hemoglobin  Date Value Ref Range Status  09/09/2020 15.6 13.0 - 17.7 g/dL Final   Hematocrit  Date Value Ref Range Status  09/09/2020 44.8 37.5 - 51.0 % Final   MCHC  Date Value Ref Range Status  09/09/2020 34.8 31.5 - 35.7 g/dL Final  03/10/2019 34.6 30.0 - 36.0 g/dL Final   [facility]  Date Value Ref Range Status  09/09/2020 31.6 26.6 - 33.0 pg Final  03/10/2019 31.4 26.0 - 34.0 pg Final   MCV  Date Value Ref Range Status  09/09/2020 91 79 - 97 fL Final   No results found for: "PLTCOUNTKUC", "LABPLAT", "POCPLA" RDW  Date Value Ref Range Status  09/09/2020 13.2 11.6 - 15.4 % Final         Passed - Cr in normal range and within 360 days    Creatinine, Ser  Date Value Ref Range Status  05/11/2022 1.15 0.76 - 1.27 mg/dL Final   Creatinine, Urine  Date Value Ref Range Status  03/05/2019 82 mg/dL Final    Comment:    Performed at [facility],  [address], [place], [place] [address]         Passed - Valid encounter within last 12 months    Recent Outpatient Visits           3 weeks ago Hypertension associated with diabetes [facility])   [place], Karen, NP   2 months ago Aortic atherosclerosis [facility])   [place] Jon Billings, NP   5 months ago Hypertension associated with diabetes [facility])   [place], Karen, NP   6 months ago Upper respiratory tract infection, unspecified type   [place] [facility] Mecum, Erin E, PA-C   7 months ago Hypertension associated with diabetes [facility])   [place] Jon Billings, NP       Future Appointments             In 1 month Jon Billings, NP [place], [facility]

## 2022-07-10 NOTE — Telephone Encounter (Signed)
Does he have enough to get to his appointment with Santiago Glad in May?

## 2022-07-13 NOTE — Telephone Encounter (Signed)
Spoke with patient to follow up with request per Jon Billings, NP. Patient says he does have enough medication to get him to his appointment in May with PCP.

## 2022-07-31 ENCOUNTER — Other Ambulatory Visit: Payer: Self-pay | Admitting: Nurse Practitioner

## 2022-07-31 DIAGNOSIS — E1169 Type 2 diabetes mellitus with other specified complication: Secondary | ICD-10-CM

## 2022-08-01 NOTE — Telephone Encounter (Signed)
Requested Prescriptions  Pending Prescriptions Disp Refills   glipiZIDE (GLUCOTROL) 5 MG tablet [Pharmacy Med Name: GLIPIZIDE 5 MG Tablet] 90 tablet 0    Sig: TAKE 1 TABLET EVERY DAY BEFORE BREAKFAST     Endocrinology:  Diabetes - Sulfonylureas Passed - 07/31/2022  6:03 AM      Passed - HBA1C is between 0 and 7.9 and within 180 days    HB A1C (BAYER DCA - WAIVED)  Date Value Ref Range Status  07/28/2020 7.5 (H) <7.0 % Final    Comment:                                          Diabetic Adult            <7.0                                       Healthy Adult        4.3 - 5.7                                                           (DCCT/NGSP) American Diabetes Association's Summary of Glycemic Recommendations for Adults with Diabetes: Hemoglobin A1c <7.0%. More stringent glycemic goals (A1c <6.0%) may further reduce complications at the cost of increased risk of hypoglycemia.    Hgb A1c MFr Bld  Date Value Ref Range Status  05/11/2022 6.8 (H) 4.8 - 5.6 % Final    Comment:             Prediabetes: 5.7 - 6.4          Diabetes: >6.4          Glycemic control for adults with diabetes: <7.0          Passed - Cr in normal range and within 360 days    Creatinine, Ser  Date Value Ref Range Status  05/11/2022 1.15 0.76 - 1.27 mg/dL Final   Creatinine, Urine  Date Value Ref Range Status  03/05/2019 82 mg/dL Final    Comment:    Performed at [facility], [address] [place], [place] [address]         Passed - Valid encounter within last 6 months    Recent Outpatient Visits           1 month ago Hypertension associated with diabetes [facility])   [place] [facility]ae Grooms, NP   2 months ago Aortic atherosclerosis [facility])   [place] [facility]rooms, NP   5 months ago Hypertension associated with diabetes [place])   [place] [facility] Lar[facility] Grooms, NP   6 months ago Upper respiratory tract  infection, unspecified type   [place] [facility] Mecum, Erin E, PA-C   7 months ago Hypertension associated with diabetes [facility])   [place] [facility] Larae Grooms, NP       Future Appointments             In 2 weeks Larae Grooms, NP  [facility]  losartan (COZAAR) 100 MG tablet [Pharmacy Med Name: LOSARTAN POTASSIUM 100 MG Tablet] 90 tablet 0    Sig: TAKE 1 TABLET EVERY DAY     Cardiovascular:  Angiotensin Receptor Blockers Passed - 07/31/2022  6:03 AM      Passed - Cr in normal range and within 180 days    Creatinine, Ser  Date Value Ref Range Status  05/11/2022 1.15 0.76 - 1.27 mg/dL Final   Creatinine, Urine  Date Value Ref Range Status  03/05/2019 82 mg/dL Final    Comment:    Performed at [facility], [address] [place], [place] [address]         Passed - K in normal range and within 180 days    Potassium  Date Value Ref Range Status  05/11/2022 4.8 3.5 - 5.2 mmol/L Final         Passed - Patient is not pregnant      Passed - Last BP in normal range    BP Readings from Last 1 Encounters:  06/13/22 125/67         Passed - Valid encounter within last 6 months    Recent Outpatient Visits           1 month ago Hypertension associated with diabetes [facility])   [place] [facility] Larae Grooms, NP   2 months ago Aortic atherosclerosis [facility])   [facility] Larae Grooms, NP   5 months ago Hypertension associated with diabetes [facility])   [place] [facility] Larae Grooms, NP   6 months ago Upper respiratory tract infection, unspecified type   [place] [facility] Mecum, Erin E, PA-C   7 months ago Hypertension associated with diabetes [facility])   [facility] Larae Grooms, NP       Future Appointments             In 2 weeks Larae Grooms, NP [facility]             tamsulosin (FLOMAX) 0.4 MG CAPS capsule [Pharmacy Med Name: TAMSULOSIN HYDROCHLORIDE 0.4 MG Capsule] 90 capsule 0    Sig: TAKE 1 CAPSULE EVERY DAY     Urology: Alpha-Adrenergic Blocker Failed - 07/31/2022  6:03 AM      Failed - PSA in normal range and within 360 days    Prostate Specific Ag, Serum  Date Value Ref Range Status  07/28/2020 2.4 0.0 - 4.0 ng/mL Final    Comment:    Roche ECLIA methodology. According to the American Urological Association, Serum PSA should decrease and remain at undetectable levels after radical prostatectomy. The AUA defines biochemical recurrence as an initial PSA value 0.2 ng/mL or greater followed by a subsequent confirmatory PSA value 0.2 ng/mL or greater. Values obtained with different assay methods or kits cannot be used interchangeably. Results cannot be interpreted as absolute evidence of the presence or absence of malignant disease.          Passed - Last BP in normal range    BP Readings from Last 1 Encounters:  06/13/22 125/67         Passed - Valid encounter within last 12 months    Recent Outpatient Visits           1 month ago Hypertension associated with diabetes [facility])   [place] [facility] Larae Grooms, NP   2 months ago Aortic atherosclerosis (HCC)  [place] [facility] [place], Clydie Braun, NP   5 months ago Hypertension associated with diabetes [facility])   [place] [facility] Larae Grooms, NP   6 months ago Upper respiratory tract infection, unspecified type   [place] [facility] Mecum, Erin E, PA-C   7 months ago Hypertension associated with diabetes [facility])   [place] [facility] Larae Grooms, NP       Future Appointments             In 2 weeks Larae Grooms, NP [place] [facility]             amLODipine (NORVASC) 10 MG tablet [Pharmacy Med  Name: AMLODIPINE BESYLATE 10 MG Tablet] 90 tablet 0    Sig: TAKE 1 TABLET EVERY DAY     Cardiovascular: Calcium Channel Blockers 2 Passed - 07/31/2022  6:03 AM      Passed - Last BP in normal range    BP Readings from Last 1 Encounters:  06/13/22 125/67         Passed - Last Heart Rate in normal range    Pulse Readings from Last 1 Encounters:  06/13/22 (!) 56         Passed - Valid encounter within last 6 months    Recent Outpatient Visits           1 month ago Hypertension associated with diabetes (HCC)   [place] [facility] Larae Grooms, NP   2 months ago Aortic atherosclerosis [facility])   [place] [facility] Larae Grooms, NP   5 months ago Hypertension associated with diabetes [facility])   [place] [facility] Larae Grooms, NP   6 months ago Upper respiratory tract infection, unspecified type   [place] [facility] Mecum, Erin E, PA-C   7 months ago Hypertension associated with diabetes [facility])   [place] [facility] Larae Grooms, NP       Future Appointments             In 2 weeks Larae Grooms, NP [place] [facility]             rosuvastatin (CRESTOR) 40 MG tablet [Pharmacy Med Name: ROSUVASTATIN CALCIUM 40 MG Tablet] 90 tablet 0    Sig: TAKE 1 TABLET EVERY DAY     Cardiovascular:  Antilipid - Statins 2 Failed - 07/31/2022  6:03 AM      Failed - Lipid Panel in normal range within the last 12 months    Cholesterol, Total  Date Value Ref Range Status  02/08/2022 128 100 - 199 mg/dL Final   Cholesterol Piccolo, Waived  Date Value Ref Range Status  03/28/2018 122 <200 mg/dL Final    Comment:                            Desirable                <200                         Borderline High      200- 239                         High                     >  239    LDL Chol Calc (NIH)  Date Value Ref Range Status  02/08/2022 64 0 - 99 mg/dL Final    HDL  Date Value Ref Range Status  02/08/2022 39 (L) >39 mg/dL Final   Triglycerides  Date Value Ref Range Status  02/08/2022 141 0 - 149 mg/dL Final   Triglycerides Piccolo,Waived  Date Value Ref Range Status  03/28/2018 232 (H) <150 mg/dL Final    Comment:                            Normal                   <150                         Borderline High     150 - 199                         High                200 - 499                         Very High                >499          Passed - Cr in normal range and within 360 days    Creatinine, Ser  Date Value Ref Range Status  05/11/2022 1.15 0.76 - 1.27 mg/dL Final   Creatinine, Urine  Date Value Ref Range Status  03/05/2019 82 mg/dL Final    Comment:    Performed at [facility], [address] [place], [place] [address]         Passed - Patient is not pregnant      Passed - Valid encounter within last 12 months    Recent Outpatient Visits           1 month ago Hypertension associated with diabetes [facility])   [place] [facility] Larae Grooms, NP   2 months ago Aortic atherosclerosis [facility])   [facility] Larae Grooms, NP   5 months ago Hypertension associated with diabetes [facility])   [place] Larae Grooms, NP   6 months ago Upper respiratory tract infection, unspecified type   [place] Mecum, Erin E, PA-C   7 months ago Hypertension associated with diabetes [facility])   [place] [facility] Larae Grooms, NP       Future Appointments             In 2 weeks Larae Grooms, NP [facility]

## 2022-08-15 ENCOUNTER — Other Ambulatory Visit: Payer: Self-pay | Admitting: Nurse Practitioner

## 2022-08-15 NOTE — Telephone Encounter (Signed)
Requested Prescriptions  Pending Prescriptions Disp Refills   pantoprazole (PROTONIX) 40 MG tablet [Pharmacy Med Name: PANTOPRAZOLE SODIUM 40 MG Tablet Delayed Release] 90 tablet 3    Sig: TAKE 1 TABLET EVERY DAY     Gastroenterology: Proton Pump Inhibitors Passed - 08/15/2022 11:11 AM      Passed - Valid encounter within last 12 months    Recent Outpatient Visits           2 months ago Hypertension associated with diabetes [facility])   [place] [facility] G[facility], NP   3 months ago Aortic atherosclerosis [facility])   [place] [facility] Larae Grooms, NP   6 months ago Hypertension associated with diabetes [facility])   [place] [facility] Larae Grooms, NP   7 months ago Upper respiratory tract infection, unspecified type   [place] [facility] Mecum, Erin E, PA-C   8 months ago Hypertension associated with diabetes [facility])   [place] [facility] Larae Grooms, NP       Future Appointments             Tomorrow Larae Grooms, NP  [facility]

## 2022-08-16 ENCOUNTER — Ambulatory Visit: Payer: Medicare PPO | Admitting: Nurse Practitioner

## 2022-08-16 NOTE — Progress Notes (Deleted)
There were no vitals taken for this visit.   Subjective:    Patient ID: Carl Bryan, male    DOB: June 06, 1945, 77 y.o.   MRN: [id number]  HPI: Carl Bryan is a 77 y.o. male  No chief complaint on file.  HYPERTENSION / HYPERLIPIDEMIA Satisfied with current treatment? yes Duration of hypertension: years BP monitoring frequency: not checking BP range:  BP medication side effects: no Past BP meds: amlodipine and losartan (cozaar) Duration of hyperlipidemia: years Cholesterol medication side effects: no Cholesterol supplements: none Past cholesterol medications: rosuvastatin (crestor) Medication compliance: excellent compliance Aspirin: no Recent stressors: no Recurrent headaches: no Visual changes: no Palpitations: no Dyspnea: no Chest pain: no Lower extremity edema: no Dizzy/lightheaded: no  DIABETES Denies concerns during visit today.  Takes metformin and glipizide, Jardiance. has had issues with obtaining a prescription refill.  Hypoglycemic episodes:no Polydipsia/polyuria: no Visual disturbance: no Chest pain: no Paresthesias: no Glucose Monitoring: yes  Accucheck frequency: Daily;  Fasting glucose: 99-140  Post prandial:  Evening:  Before meals: Taking Insulin?: no  Long acting insulin:  Short acting insulin: Blood Pressure Monitoring: daily Retinal Examination: Has an appt this month;  Foot Exam: up to date Diabetic Education: Completed today Pneumovax: Up to Date Influenza: Completed today Aspirin: no  Relevant past medical, surgical, family and social history reviewed and updated as indicated. Interim medical history since our last visit reviewed. Allergies and medications reviewed and updated.  Review of Systems  Constitutional:  Negative for activity change, appetite change, chills, fever and unexpected weight change.  Eyes:  Negative for visual disturbance.  Respiratory:  Negative for cough, chest tightness and shortness of breath.    Cardiovascular:  Negative for chest pain, palpitations and leg swelling.  Gastrointestinal:  Negative for diarrhea.  Endocrine: Negative for polydipsia and polyuria.  Musculoskeletal:  Negative for joint swelling.  Neurological:  Negative for dizziness, light-headedness, numbness and headaches.  Psychiatric/Behavioral:  Negative for sleep disturbance and suicidal ideas. The patient is not nervous/anxious.     Per HPI unless specifically indicated above     Objective:    There were no vitals taken for this visit.  Wt Readings from Last 3 Encounters:  06/13/22 165 lb 4.8 oz (75 kg)  05/11/22 164 lb 9.6 oz (74.7 kg)  02/08/22 163 lb 1.6 oz (74 kg)    Physical Exam Vitals and nursing note reviewed.  Constitutional:      General: He is not in acute distress.    Appearance: Normal appearance. He is not ill-appearing, toxic-appearing or diaphoretic.  HENT:     Nose: No congestion or rhinorrhea.  Eyes:     General:        Right eye: No discharge.        Left eye: No discharge.     Pupils: Pupils are equal, round, and reactive to light.  Cardiovascular:     Rate and Rhythm: Normal rate. Rhythm irregular.     Pulses:          Dorsalis pedis pulses are 1+ on the left side.       Posterior tibial pulses are 1+ on the left side.     Heart sounds: No murmur heard. Pulmonary:     Effort: Pulmonary effort is normal. No respiratory distress.     Breath sounds: Normal breath sounds. No wheezing, rhonchi or rales.  Musculoskeletal:     Cervical back: Normal range of motion and neck supple.     Comments: Uses crutches  due to R AKA.     Right Lower Extremity: Right leg is amputated above knee.  Feet:     Left foot:     Protective Sensation: 10 sites tested.  10 sites sensed.     Skin integrity: Dry skin present. No skin breakdown or warmth.     Toenail Condition: Left toenails are abnormally thick. Fungal disease present. Skin:    General: Skin is warm and dry.     Capillary Refill:  Capillary refill takes less than 2 seconds.  Neurological:     General: No focal deficit present.     Mental Status: He is alert and oriented to person, place, and time.  Psychiatric:        Mood and Affect: Mood normal.        Behavior: Behavior normal.        Thought Content: Thought content normal.        Judgment: Judgment normal.    Results for orders placed or performed in visit on 05/11/22  Comp Met (CMET)  Result Value Ref Range   Glucose 140 (H) 70 - 99 mg/dL   BUN 16 8 - 27 mg/dL   Creatinine, Ser 1.61 0.76 - 1.27 mg/dL   eGFR 65 >09 UE/AVW/0.98   BUN/Creatinine Ratio 14 10 - 24   Sodium 139 134 - 144 mmol/L   Potassium 4.8 3.5 - 5.2 mmol/L   Chloride 103 96 - 106 mmol/L   CO2 21 20 - 29 mmol/L   Calcium 10.5 (H) 8.6 - 10.2 mg/dL   Total Protein 7.0 6.0 - 8.5 g/dL   Albumin 4.9 (H) 3.8 - 4.8 g/dL   Globulin, Total 2.1 1.5 - 4.5 g/dL   Albumin/Globulin Ratio 2.3 (H) 1.2 - 2.2   Bilirubin Total 0.5 0.0 - 1.2 mg/dL   Alkaline Phosphatase 73 44 - 121 IU/L   AST 19 0 - 40 IU/L   ALT 19 0 - 44 IU/L  HgB A1c  Result Value Ref Range   Hgb A1c MFr Bld 6.8 (H) 4.8 - 5.6 %   Est. average glucose Bld gHb Est-mCnc 148 mg/dL  Vitamin D (25 hydroxy)  Result Value Ref Range   Vit D, 25-Hydroxy 31.5 30.0 - 100.0 ng/mL  B12  Result Value Ref Range   Vitamin B-12 1,396 (H) 232 - 1,245 pg/mL      Assessment & Plan:   Problem List Items Addressed This Visit      Cardiovascular and Mediastinum   Hypertension associated with diabetes (HCC) - Primary   CAD (coronary artery disease)   Aortic atherosclerosis (HCC)     Endocrine   Hyperlipidemia associated with type 2 diabetes mellitus (HCC)   Diabetes mellitus associated with hormonal etiology (HCC)     Other   Traumatic above-knee amputation of right lower extremity, subsequent encounter (HCC)   Vitamin D deficiency   B12 deficiency     Follow up plan: No follow-ups on file.

## 2022-09-15 ENCOUNTER — Other Ambulatory Visit: Payer: Self-pay | Admitting: Nurse Practitioner

## 2022-09-15 DIAGNOSIS — E1169 Type 2 diabetes mellitus with other specified complication: Secondary | ICD-10-CM

## 2022-09-15 NOTE — Telephone Encounter (Signed)
Requested medications are due for refill today.  yes  Requested medications are on the active medications list.  yes  Last refill. 12/07/2021 #90 1 rf  Future visit scheduled.   no  Notes to clinic.  Labs are expired.    Requested Prescriptions  Pending Prescriptions Disp Refills   allopurinol (ZYLOPRIM) 300 MG tablet [Pharmacy Med Name: ALLOPURINOL 300 MG Tablet] 90 tablet 3    Sig: TAKE 1 TABLET EVERY DAY     Endocrinology:  Gout Agents - allopurinol Failed - 09/15/2022  2:49 PM      Failed - Uric Acid in normal range and within 360 days    Uric Acid  Date Value Ref Range Status  07/28/2020 2.4 (L) 3.8 - 8.4 mg/dL Final    Comment:               Therapeutic target for gout patients: <6.0         Failed - CBC within normal limits and completed in the last 12 months    WBC  Date Value Ref Range Status  09/09/2020 8.8 3.4 - 10.8 x10E3/uL Final  03/10/2019 6.4 4.0 - 10.5 K/uL Final   RBC  Date Value Ref Range Status  09/09/2020 4.94 4.14 - 5.80 x10E6/uL Final  03/10/2019 4.14 (L) 4.22 - 5.81 MIL/uL Final   Hemoglobin  Date Value Ref Range Status  09/09/2020 15.6 13.0 - 17.7 g/dL Final   Hematocrit  Date Value Ref Range Status  09/09/2020 44.8 37.5 - 51.0 % Final   MCHC  Date Value Ref Range Status  09/09/2020 34.8 31.5 - 35.7 g/dL Final  16/01/9603 54.0 30.0 - 36.0 g/dL Final   [facility]  Date Value Ref Range Status  09/09/2020 31.6 26.6 - 33.0 pg Final  03/10/2019 31.4 26.0 - 34.0 pg Final   MCV  Date Value Ref Range Status  09/09/2020 91 79 - 97 fL Final   No results found for: "PLTCOUNTKUC", "LABPLAT", "POCPLA" RDW  Date Value Ref Range Status  09/09/2020 13.2 11.6 - 15.4 % Final         Passed - Cr in normal range and within 360 days    Creatinine, Ser  Date Value Ref Range Status  05/11/2022 1.15 0.76 - 1.27 mg/dL Final   Creatinine, Urine  Date Value Ref Range Status  03/05/2019 82 mg/dL Final    Comment:    Performed at [facility],  [address] [place], [place] [address]         Passed - Valid encounter within last 12 months    Recent Outpatient Visits           3 months ago Hypertension associated with diabetes [facility])   [place] [facility] Larae Grooms, NP   4 months ago Aortic atherosclerosis [facility])   [place] [facility] Larae Grooms, NP   7 months ago Hypertension associated with diabetes [facility])   [place] [facility] Larae Grooms, NP   8 months ago Upper respiratory tract infection, unspecified type   [place] [facility] Mecum, Erin E, PA-C   9 months ago Hypertension associated with diabetes [facility])   [place] [facility] Larae Grooms, NP

## 2022-09-26 ENCOUNTER — Encounter: Payer: Self-pay | Admitting: Family Medicine

## 2022-09-26 ENCOUNTER — Ambulatory Visit: Payer: Medicare PPO | Admitting: Family Medicine

## 2022-09-26 VITALS — BP 144/60 | HR 59 | Temp 98.4°F | Wt 168.0 lb

## 2022-09-26 DIAGNOSIS — E1169 Type 2 diabetes mellitus with other specified complication: Secondary | ICD-10-CM

## 2022-09-26 DIAGNOSIS — Z0000 Encounter for general adult medical examination without abnormal findings: Secondary | ICD-10-CM

## 2022-09-26 DIAGNOSIS — Z7984 Long term (current) use of oral hypoglycemic drugs: Secondary | ICD-10-CM

## 2022-09-26 DIAGNOSIS — E538 Deficiency of other specified B group vitamins: Secondary | ICD-10-CM | POA: Diagnosis not present

## 2022-09-26 DIAGNOSIS — R0602 Shortness of breath: Secondary | ICD-10-CM

## 2022-09-26 DIAGNOSIS — E785 Hyperlipidemia, unspecified: Secondary | ICD-10-CM | POA: Diagnosis not present

## 2022-09-26 DIAGNOSIS — Z7189 Other specified counseling: Secondary | ICD-10-CM

## 2022-09-26 LAB — BAYER DCA HB A1C WAIVED: HB A1C (BAYER DCA - WAIVED): 6.9 % — ABNORMAL HIGH (ref 4.8–5.6)

## 2022-09-26 MED ORDER — ALLOPURINOL 300 MG PO TABS: 300.0000 mg | ORAL_TABLET | Freq: Every day | ORAL | 1 refills | Status: DC

## 2022-09-26 MED ORDER — ALBUTEROL SULFATE HFA 108 (90 BASE) MCG/ACT IN AERS: 2.0000 | INHALATION_SPRAY | Freq: Four times a day (QID) | RESPIRATORY_TRACT | 0 refills | Status: DC | PRN

## 2022-09-26 NOTE — Progress Notes (Deleted)
There were no vitals taken for this visit.   Subjective:    Patient ID: Carl Bryan, male    DOB: 1946/02/14, 77 y.o.   MRN: [id number]  HPI: Carl Bryan is a 77 y.o. male  No chief complaint on file.  DIABETES He is currently taking Glipizide 5 mg, Metformin 2000, and Jardiance 10 mg daily. Last A1C 6.8  Hypoglycemic episodes:{Blank single:19197::"yes","no"} Polydipsia/polyuria: {Blank single:19197::"yes","no"} Visual disturbance: {Blank single:19197::"yes","no"} Chest pain: {Blank single:19197::"yes","no"} Paresthesias: {Blank single:19197::"yes","no"} Glucose Monitoring: {Blank single:19197::"yes","no"}  Accucheck frequency: {Blank single:19197::"Not Checking","Daily","BID","TID"}  Fasting glucose:  Post prandial:  Evening:  Before meals: Taking Insulin?: no  Long acting insulin:  Short acting insulin: Blood Pressure Monitoring: {Blank single:19197::"not checking","rarely","daily","weekly","monthly","a few times a day","a few times a week","a few times a month"} Retinal Examination: {Blank single:19197::"Up to Date","Not up to Date"} Foot Exam: Up to Date Diabetic Education: {Blank single:19197::"Completed","Not Completed"} Pneumovax: Up to Date Influenza: Up to Date Aspirin: {Blank single:19197::"yes","no"}   HYPERTENSION / HYPERLIPIDEMIA He is currently taking Amlodipine 10 mg and Losartan 100 mg daily, He is taking Crestor 40mg  daily. Satisfied with current treatment? {Blank single:19197::"yes","no"} Duration of hypertension: {Blank single:19197::"chronic","months","years"} BP monitoring frequency: {Blank single:19197::"not checking","rarely","daily","weekly","monthly","a few times a day","a few times a week","a few times a month"} BP range:  BP medication side effects: {Blank single:19197::"yes","no"} Past BP meds: {Blank multiple:19196::"none","amlodipine","amlodipine/benazepril","atenolol","benazepril","benazepril/HCTZ","bisoprolol  (bystolic)","carvedilol","chlorthalidone","clonidine","diltiazem","exforge HCT","HCTZ","irbesartan (avapro)","labetalol","lisinopril","lisinopril-HCTZ","losartan (cozaar)","methyldopa","nifedipine","olmesartan (benicar)","olmesartan-HCTZ","quinapril","ramipril","spironalactone","tekturna","valsartan","valsartan-HCTZ","verapamil"} Duration of hyperlipidemia: {Blank single:19197::"chronic","months","years"} Cholesterol medication side effects: {Blank single:19197::"yes","no"} Cholesterol supplements: {Blank multiple:19196::"none","fish oil","niacin","red yeast rice"} Past cholesterol medications: {Blank multiple:19196::"none","atorvastain (lipitor)","lovastatin (mevacor)","pravastatin (pravachol)","rosuvastatin (crestor)","simvastatin (zocor)","vytorin","fenofibrate (tricor)","gemfibrozil","ezetimide (zetia)","niaspan","lovaza"} Medication compliance: {Blank single:19197::"excellent compliance","good compliance","fair compliance","poor compliance"} Aspirin: {Blank single:19197::"yes","no"} Recent stressors: {Blank single:19197::"yes","no"} Recurrent headaches: {Blank single:19197::"yes","no"} Visual changes: {Blank single:19197::"yes","no"} Palpitations: {Blank single:19197::"yes","no"} Dyspnea: {Blank single:19197::"yes","no"} Chest pain: {Blank single:19197::"yes","no"} Lower extremity edema: {Blank single:19197::"yes","no"} Dizzy/lightheaded: {Blank single:19197::"yes","no"}   Relevant past medical, surgical, family and social history reviewed and updated as indicated. Interim medical history since our last visit reviewed. Allergies and medications reviewed and updated.  Review of Systems  Constitutional: Negative.   Eyes: Negative.  Negative for visual disturbance.  Respiratory: Negative.    Cardiovascular:  Negative for chest pain.  Endocrine: Negative.  Negative for polydipsia, polyphagia and polyuria.  Genitourinary: Negative.  Negative for frequency.  Neurological:  Negative for  numbness.    Per HPI unless specifically indicated above     Objective:    There were no vitals taken for this visit.  Wt Readings from Last 3 Encounters:  06/13/22 165 lb 4.8 oz (75 kg)  05/11/22 164 lb 9.6 oz (74.7 kg)  02/08/22 163 lb 1.6 oz (74 kg)    Physical Exam Vitals and nursing note reviewed.  Constitutional:      General: He is awake. He is not in acute distress.    Appearance: Normal appearance. He is well-developed and well-groomed. He is not ill-appearing.  HENT:     Head: Normocephalic and atraumatic.     Right Ear: Hearing and external ear normal. No drainage.     Left Ear: Hearing and external ear normal. No drainage.     Nose: Nose normal.  Eyes:     General: Lids are normal.        Right eye: No discharge.        Left eye: No discharge.     Conjunctiva/sclera: Conjunctivae normal.  Cardiovascular:     Rate and Rhythm: Normal rate and regular rhythm.     Heart sounds: Normal heart sounds, S1 normal and S2 normal. No murmur heard.  No gallop.  Pulmonary:     Effort: Pulmonary effort is normal. No accessory muscle usage or respiratory distress.     Breath sounds: Normal breath sounds.  Musculoskeletal:        General: Normal range of motion.     Cervical back: Full passive range of motion without pain and normal range of motion.     Right lower leg: No edema.     Left lower leg: No edema.  Skin:    General: Skin is warm and dry.     Capillary Refill: Capillary refill takes less than 2 seconds.  Neurological:     Mental Status: He is alert and oriented to person, place, and time.  Psychiatric:        Attention and Perception: Attention normal.        Mood and Affect: Mood normal.        Speech: Speech normal.        Behavior: Behavior normal. Behavior is cooperative.        Thought Content: Thought content normal.     Results for orders placed or performed in visit on 05/11/22  Comp Met (CMET)  Result Value Ref Range   Glucose 140 (H) 70 - 99  mg/dL   BUN 16 8 - 27 mg/dL   Creatinine, Ser 1.91 0.76 - 1.27 mg/dL   eGFR 65 >47 WG/NFA/2.13   BUN/Creatinine Ratio 14 10 - 24   Sodium 139 134 - 144 mmol/L   Potassium 4.8 3.5 - 5.2 mmol/L   Chloride 103 96 - 106 mmol/L   CO2 21 20 - 29 mmol/L   Calcium 10.5 (H) 8.6 - 10.2 mg/dL   Total Protein 7.0 6.0 - 8.5 g/dL   Albumin 4.9 (H) 3.8 - 4.8 g/dL   Globulin, Total 2.1 1.5 - 4.5 g/dL   Albumin/Globulin Ratio 2.3 (H) 1.2 - 2.2   Bilirubin Total 0.5 0.0 - 1.2 mg/dL   Alkaline Phosphatase 73 44 - 121 IU/L   AST 19 0 - 40 IU/L   ALT 19 0 - 44 IU/L  HgB A1c  Result Value Ref Range   Hgb A1c MFr Bld 6.8 (H) 4.8 - 5.6 %   Est. average glucose Bld gHb Est-mCnc 148 mg/dL  Vitamin D (25 hydroxy)  Result Value Ref Range   Vit D, 25-Hydroxy 31.5 30.0 - 100.0 ng/mL  B12  Result Value Ref Range   Vitamin B-12 1,396 (H) 232 - 1,245 pg/mL      Assessment & Plan:   Problem List Items Addressed This Visit   None    Follow up plan: No follow-ups on file.

## 2022-09-26 NOTE — Progress Notes (Unsigned)
BP (!) 144/60   Pulse (!) 59   Temp 98.4 F (36.9 C) (Oral)   Wt 168 lb (76.2 kg)   SpO2 97%   BMI 27.13 kg/m    Subjective:    Patient ID: Carl Bryan, male    DOB: 1946-02-25, 77 y.o.   MRN: [id number]  HPI: Carl Bryan is a 77 y.o. male presenting on 09/26/2022 for comprehensive medical examination. Current medical complaints include: Sob with exertion  He currently lives with: wife Interim Problems from his last visit: no  DIABETES He is currently taking Glipizide 5 mg, Metformin 2000, and Jardiance 10 mg daily. Last A1C 6.8. At night he has to use the bathroom more frequently. Diet consist of 2-3 meals daily.  Hypoglycemic episodes:no Polydipsia/polyuria: yes drinking 5-6 bottles.  Visual disturbance: no Chest pain: no Paresthesias: no Glucose Monitoring: yes  Accucheck frequency: Daily  Fasting glucose:80-100, lowest   Taking Insulin?: no Blood Pressure Monitoring: weekly, will bring it next time.  Retinal Examination: Up to Date Foot Exam: Up to Date Diabetic Education: Completed Pneumovax: Up to Date Influenza: Up to Date Aspirin: no   HYPERTENSION / HYPERLIPIDEMIA He is currently taking Amlodipine 10 mg and Losartan 100 mg daily, He is taking Crestor 40mg  daily. Satisfied with current treatment? yes Duration of hypertension: chronic BP monitoring frequency: weekly BP range:  BP medication side effects: no Duration of hyperlipidemia: chronic Cholesterol medication side effects: no Cholesterol supplements: none Medication compliance: excellent compliance Aspirin: no Recent stressors: no Recurrent headaches: no Visual changes: no Palpitations: no Dyspnea: yes Chest pain: no Lower extremity edema: no Dizzy/lightheaded: no   He complains of head cold and productive cough with congestion in the mornings. He noticed this 3 weeks ago. He denies sick exposures, fever, runny nose, sore throat, headache, and chest pain. He has tried OTC cough medicine, which  helped a little.   He also complains of sob only with exertion. he notices he gets short winded with walking few distances. He denies chest tightness or pain with this.   Gout has been controlled with Allopurinol, he is requesting a refill for this, would like to continue taking the Allopurinol for prevention  Functional Status Survey: Is the patient deaf or have difficulty hearing?: Yes Does the patient have difficulty seeing, even when wearing glasses/contacts?: No Does the patient have difficulty concentrating, remembering, or making decisions?: No Does the patient have difficulty walking or climbing stairs?: No Does the patient have difficulty dressing or bathing?: No Does the patient have difficulty doing errands alone such as visiting a doctor's office or shopping?: No  FALL RISK:    09/26/2022    8:50 AM 02/08/2022   10:04 AM 08/19/2021    9:24 AM 08/03/2021   10:41 AM 03/08/2021    9:09 AM  Fall Risk   Falls in the past year? 1 0 1 0 1  Number falls in past yr: 1 0 0 0 1  Injury with Fall? 0 0 0 0 1  Risk for fall due to : Impaired balance/gait;Impaired mobility No Fall Risks History of fall(s) Impaired balance/gait   Follow up Falls evaluation completed Falls evaluation completed Falls evaluation completed Falls evaluation completed;Education provided;Falls prevention discussed     Depression Screen    09/26/2022    8:51 AM 02/08/2022   10:04 AM 08/19/2021    9:24 AM 08/03/2021   10:42 AM 03/08/2021    9:10 AM  Depression screen PHQ 2/9  Decreased Interest 0 1  0 0 0  Down, Depressed, Hopeless 0 0 0 0 0  PHQ - 2 Score 0 1 0 0 0  Altered sleeping 1 1 0  0  Tired, decreased energy 1 1 1   0  Change in appetite 0 0 0  0  Feeling bad or failure about yourself  0 0 0  0  Trouble concentrating 0 0 0  0  Moving slowly or fidgety/restless 0 0 0  1  Suicidal thoughts 0 0 0  0  PHQ-9 Score 2 3 1  1   Difficult doing work/chores Not difficult at all Not difficult at all Not  difficult at all  Not difficult at all    Advanced Directives Does patient have a HCPOA?    no If yes, name and contact information: None Does patient have a living will or MOST form?  no  Past Medical History:  Past Medical History:  Diagnosis Date   Bleeding ulcer    Bronchospasm    Diabetes mellitus without complication (HCC)    Type 2   Diverticulosis    Pure hypercholesterolemia    Traumatic amputation of leg above knee (HCC)     Surgical History:  Past Surgical History:  Procedure Laterality Date   ABOVE KNEE LEG AMPUTATION  04/10/1969   APPENDECTOMY     KNEE SURGERY     x 2.   REPLACEMENT TOTAL KNEE Left     Medications:  Current Outpatient Medications on File Prior to Visit  Medication Sig   Alcohol Swabs (B-D SINGLE USE SWABS REGULAR) PADS 1 Application by Does not apply route in the morning, at noon, and at bedtime.   amLODipine (NORVASC) 10 MG tablet TAKE 1 TABLET EVERY DAY   Blood Glucose Calibration (TRUE METRIX LEVEL 1) Low SOLN 1 Application by In Vitro route once a week.   Blood Glucose Calibration (TRUE METRIX LEVEL 3) High SOLN 1 Application by In Vitro route once a week.   cholecalciferol 25 MCG (1000 UT) tablet TAKE 2 TABLETS EVERY DAY   cyanocobalamin (VITAMIN B12) 1000 MCG tablet Take 1 tablet (1,000 mcg total) by mouth daily.   empagliflozin (JARDIANCE) 10 MG TABS tablet Take 1 tablet (10 mg total) by mouth daily before breakfast.   glipiZIDE (GLUCOTROL) 5 MG tablet TAKE 1 TABLET EVERY DAY BEFORE BREAKFAST   glucose blood (ONETOUCH VERIO) test strip 1 each by Other route 2 (two) times daily. Use as instructed   losartan (COZAAR) 100 MG tablet TAKE 1 TABLET EVERY DAY   meloxicam (MOBIC) 15 MG tablet Take 1 tablet (15 mg total) by mouth daily.   metFORMIN (GLUCOPHAGE) 500 MG tablet TAKE 2 TABLETS TWICE DAILY WITH MEALS   pantoprazole (PROTONIX) 40 MG tablet TAKE 1 TABLET EVERY DAY   rosuvastatin (CRESTOR) 40 MG tablet TAKE 1 TABLET EVERY DAY    tamsulosin (FLOMAX) 0.4 MG CAPS capsule TAKE 1 CAPSULE EVERY DAY   No current facility-administered medications on file prior to visit.    Allergies:  Allergies  Allergen Reactions   Doxycycline Calcium Rash   Tussionex Pennkinetic Er [Hydrocod Poli-Chlorphe Poli Er] Itching   Vytorin [Ezetimibe-Simvastatin]     Sleepy    Social History:  Social History   Socioeconomic History   Marital status: Married    Spouse name: Not on file   Number of children: Not on file   Years of education: Not on file   Highest education level: 6th grade  Occupational History   Occupation: Retired  Tobacco  Use   Smoking status: Former    Types: Cigarettes    Quit date: 09/29/1964    Years since quitting: 58.0   Smokeless tobacco: Never  Vaping Use   Vaping Use: Never used  Substance and Sexual Activity   Alcohol use: No    Alcohol/week: 0.0 standard drinks of alcohol   Drug use: No   Sexual activity: Not Currently  Other Topics Concern   Not on file  Social History Narrative   Not on file   Social Determinants of Health   Financial Resource Strain: Low Risk  (08/03/2021)   Overall Financial Resource Strain (CARDIA)    Difficulty of Paying Living Expenses: Not hard at all  Food Insecurity: No Food Insecurity (08/03/2021)   Hunger Vital Sign    Worried About Running Out of Food in the Last Year: Never true    Ran Out of Food in the Last Year: Never true  Transportation Needs: No Transportation Needs (08/03/2021)   PRAPARE - Administrator, Civil Service (Medical): No    Lack of Transportation (Non-Medical): No  Physical Activity: Insufficiently Active (08/03/2021)   Exercise Vital Sign    Days of Exercise per Week: 2 days    Minutes of Exercise per Session: 20 min  Stress: No Stress Concern Present (08/03/2021)   [employer] of Occupational Health - Occupational Stress Questionnaire    Feeling of Stress : Not at all  Social Connections: Socially Integrated  (08/03/2021)   Social Connection and Isolation Panel [NHANES]    Frequency of Communication with Friends and Family: More than three times a week    Frequency of Social Gatherings with Friends and Family: Three times a week    Attends Religious Services: More than 4 times per year    Active Member of Clubs or Organizations: Yes    Attends Banker Meetings: More than 4 times per year    Marital Status: Married  Catering manager Violence: Not At Risk (08/03/2021)   Humiliation, Afraid, Rape, and Kick questionnaire    Fear of Current or Ex-Partner: No    Emotionally Abused: No    Physically Abused: No    Sexually Abused: No   Social History   Tobacco Use  Smoking Status Former   Types: Cigarettes   Quit date: 09/29/1964   Years since quitting: 58.0  Smokeless Tobacco Never   Social History   Substance and Sexual Activity  Alcohol Use No   Alcohol/week: 0.0 standard drinks of alcohol    Family History:  Family History  Problem Relation Age of Onset   Cancer Mother        Colon cancer   Lung disease Father     Past medical history, surgical history, medications, allergies, family history and social history reviewed with patient today and changes made to appropriate areas of the chart.   Review of Systems  Constitutional: Negative.   HENT: Negative.    Eyes: Negative.   Respiratory: Negative.  Negative for shortness of breath.   Cardiovascular: Negative.  Negative for chest pain, palpitations and leg swelling.  Gastrointestinal: Negative.   Genitourinary: Negative.  Negative for frequency.  Musculoskeletal: Negative.  Negative for myalgias.  Skin: Negative.   Neurological: Negative.  Negative for tingling and sensory change.  Endo/Heme/Allergies:  Positive for polydipsia.  Psychiatric/Behavioral: Negative.     All other ROS negative except what is listed above and in the HPI.      Objective:    BP Marland Kitchen)  144/60   Pulse (!) 59   Temp 98.4 F (36.9 C)  (Oral)   Wt 168 lb (76.2 kg)   SpO2 97%   BMI 27.13 kg/m   Wt Readings from Last 3 Encounters:  09/26/22 168 lb (76.2 kg)  06/13/22 165 lb 4.8 oz (75 kg)  05/11/22 164 lb 9.6 oz (74.7 kg)    No results found.  Physical Exam Vitals and nursing note reviewed.  Constitutional:      General: He is awake. He is not in acute distress.    Appearance: Normal appearance. He is well-developed and well-groomed. He is not ill-appearing.  HENT:     Head: Normocephalic and atraumatic.     Right Ear: Hearing and external ear normal. No drainage.     Left Ear: Hearing and external ear normal. No drainage.     Nose: Nose normal.  Eyes:     General: Lids are normal.        Right eye: No discharge.        Left eye: No discharge.     Conjunctiva/sclera: Conjunctivae normal.  Cardiovascular:     Rate and Rhythm: Regular rhythm. Bradycardia present.     Heart sounds: Normal heart sounds, S1 normal and S2 normal. No murmur heard.    No gallop.  Pulmonary:     Effort: Pulmonary effort is normal. No accessory muscle usage or respiratory distress.     Breath sounds: Normal breath sounds.  Abdominal:     General: Abdomen is protuberant. Bowel sounds are normal.     Palpations: Abdomen is soft.     Tenderness: There is no abdominal tenderness.  Musculoskeletal:        General: Normal range of motion.     Cervical back: Full passive range of motion without pain and normal range of motion.     Right lower leg: No edema.     Left lower leg: No edema.     Right Lower Extremity: Right leg is amputated above knee.  Skin:    General: Skin is warm and dry.     Capillary Refill: Capillary refill takes less than 2 seconds.  Neurological:     Mental Status: He is alert and oriented to person, place, and time.  Psychiatric:        Attention and Perception: Attention normal.        Mood and Affect: Mood normal.        Speech: Speech normal.        Behavior: Behavior normal. Behavior is cooperative.         Thought Content: Thought content normal.        09/26/2022    8:52 AM 08/03/2021   10:37 AM 08/02/2020    9:53 AM 07/30/2017    9:56 AM  6CIT Screen  What Year? 0 points 0 points 0 points 0 points  What month? 0 points 0 points 0 points 0 points  What time? 0 points 0 points 0 points 0 points  Count back from 20 0 points 0 points 0 points 0 points  Months in reverse 4 points 4 points 4 points 0 points  Repeat phrase 6 points 2 points 10 points 2 points  Total Score 10 points 6 points 14 points 2 points     Results for orders placed or performed in visit on 09/26/22  Bayer DCA Hb A1c Waived  Result Value Ref Range   HB A1C (BAYER DCA - WAIVED) 6.9 (H) 4.8 -  5.6 %      Assessment & Plan:   Problem List Items Addressed This Visit     Hyperlipidemia associated with type 2 diabetes mellitus (HCC)   Relevant Orders   Lipid Panel w/o Chol/HDL Ratio   Diabetes mellitus associated with hormonal etiology (HCC) - Primary   Relevant Orders   Bayer DCA Hb A1c Waived (Completed)   Comp Met (CMET)   B12 deficiency   Relevant Orders   B12   Other Visit Diagnoses     SOB (shortness of breath) on exertion       Acute, ongoing. Albuterol inhaler ordered every 6 hours as needed. F/u in 3 weeks, if symptoms do not improve will do spirometry testing.        Preventative Services:  Health Risk Assessment and Personalized Prevention Plan: Bone Mass Measurements: yes, per patient report CVD Screening:  Colon Cancer Screening: Yes Depression Screening: Yes Diabetes Screening: Yes Glaucoma Screening: Yes Hepatitis B vaccine: 2017 Hepatis C screening:07/06/2015 HIV Screening: done before per patient Flu Vaccine:Yes Lung cancer Screening: Not applicable Obesity Screening: Yes Pneumonia Vaccines (2):Yes PSA screening:Yes  Discussed aspirin prophylaxis for myocardial infarction prevention and decision was it was not indicated  LABORATORY TESTING:  Health maintenance labs ordered  today as discussed above.   IMMUNIZATIONS:   - Tdap: Tetanus vaccination status reviewed: last tetanus booster within 10 years. - Influenza: Up to date - Pneumovax: Up to date - Prevnar: Not applicable - Zostavax vaccine: Not applicable  SCREENING: - Colonoscopy: Not applicable  Discussed with patient purpose of the colonoscopy is to detect colon cancer at curable precancerous or early stages   - AAA Screening: Not applicable  -Hearing Test: Done elsewhere can't remember -Spirometry: Not applicable   PATIENT COUNSELING:    Sexuality: Discussed sexually transmitted diseases, partner selection, use of condoms, avoidance of unintended pregnancy  and contraceptive alternatives.   Advised to avoid cigarette smoking.  I discussed with the patient that most people either abstain from alcohol or drink within safe limits (<=14/week and <=4 drinks/occasion for males, <=7/weeks and <= 3 drinks/occasion for females) and that the risk for alcohol disorders and other health effects rises proportionally with the number of drinks per week and how often a drinker exceeds daily limits.  Discussed cessation/primary prevention of drug use and availability of treatment for abuse.   Diet: Encouraged to adjust caloric intake to maintain  or achieve ideal body weight, to reduce intake of dietary saturated fat and total fat, to limit sodium intake by avoiding high sodium foods and not adding table salt, and to maintain adequate dietary potassium and calcium preferably from fresh fruits, vegetables, and low-fat dairy products.    stressed the importance of regular exercise  Injury prevention: Discussed safety belts, safety helmets, smoke detector, smoking near bedding or upholstery.   Dental health: Discussed importance of regular tooth brushing, flossing, and dental visits.   Follow up plan: NEXT PREVENTATIVE PHYSICAL DUE IN 1 YEAR. Return in about 3 weeks (around 10/17/2022) for  for sob.

## 2022-09-26 NOTE — Patient Instructions (Addendum)
Claritin over the counter as needed to help with cough and congestion in the morning  Continue checking BP weekly and write numbers down to bring next week  Use albuterol inhaler every 6 hours as needed.

## 2022-09-27 ENCOUNTER — Ambulatory Visit: Payer: Medicare PPO | Admitting: Family Medicine

## 2022-09-27 LAB — COMPREHENSIVE METABOLIC PANEL
ALT: 15 IU/L (ref 0–44)
AST: 13 IU/L (ref 0–40)
Albumin: 4.4 g/dL (ref 3.8–4.8)
Alkaline Phosphatase: 65 IU/L (ref 44–121)
BUN/Creatinine Ratio: 14 (ref 10–24)
BUN: 16 mg/dL (ref 8–27)
Bilirubin Total: 0.4 mg/dL (ref 0.0–1.2)
CO2: 21 mmol/L (ref 20–29)
Calcium: 9.5 mg/dL (ref 8.6–10.2)
Chloride: 103 mmol/L (ref 96–106)
Creatinine, Ser: 1.16 mg/dL (ref 0.76–1.27)
Globulin, Total: 2.1 g/dL (ref 1.5–4.5)
Glucose: 105 mg/dL — ABNORMAL HIGH (ref 70–99)
Potassium: 4.4 mmol/L (ref 3.5–5.2)
Sodium: 137 mmol/L (ref 134–144)
Total Protein: 6.5 g/dL (ref 6.0–8.5)
eGFR: 64 mL/min/{1.73_m2} (ref >59)

## 2022-09-27 LAB — LIPID PANEL W/O CHOL/HDL RATIO
Cholesterol, Total: 124 mg/dL (ref 100–199)
HDL: 38 mg/dL — ABNORMAL LOW (ref >39)
LDL Chol Calc (NIH): 68 mg/dL (ref 0–99)
Triglycerides: 95 mg/dL (ref 0–149)
VLDL Cholesterol Cal: 18 mg/dL (ref 5–40)

## 2022-09-27 LAB — VITAMIN B12: Vitamin B-12: 1421 pg/mL — ABNORMAL HIGH (ref 232–1245)

## 2022-09-28 MED ORDER — EMPAGLIFLOZIN 10 MG PO TABS: 10.0000 mg | ORAL_TABLET | Freq: Every day | ORAL | 1 refills | Status: DC

## 2022-09-28 MED ORDER — METFORMIN HCL 500 MG PO TABS
ORAL_TABLET | ORAL | 1 refills | Status: DC
Start: 2022-09-28 — End: 2023-02-22

## 2022-09-28 MED ORDER — ROSUVASTATIN CALCIUM 40 MG PO TABS: 40.0000 mg | ORAL_TABLET | Freq: Every day | ORAL | 0 refills | Status: DC

## 2022-09-28 MED ORDER — GLIPIZIDE 5 MG PO TABS
ORAL_TABLET | ORAL | 0 refills | Status: DC
Start: 2022-09-28 — End: 2023-02-22

## 2022-09-28 NOTE — Assessment & Plan Note (Signed)
Chronic, ongoing. Repeat labs done from last visit d/t elevation. Continue taking Vitamin B tablets.

## 2022-09-28 NOTE — Assessment & Plan Note (Signed)
A voluntary discussion about advance care planning including the explanation and discussion of advance directives was extensively discussed  with the patient for 15 minutes with patient. Explanation about the health care proxy and Living will was reviewed and packet with forms with explanation of how to fill them out was given.  During this discussion, the patient was able to identify a health care proxy as wife and plans/does not plan to fill out the paperwork required.  Patient was offered a separate Advance Care Planning visit for further assistance with forms.

## 2022-09-28 NOTE — Assessment & Plan Note (Signed)
Chronic, controlled. Lipid labs done today. Continue taking Crestor 40 mg daily. Will treat based on labs

## 2022-09-28 NOTE — Assessment & Plan Note (Signed)
Acute, controlled. A1C up 6.9 from 6.8. Continue Jardiance 10mg , Glipizide 5mg , and Metformin 2000 mg daily. Provided educational materials on diet and checking A1c at home.

## 2022-10-07 ENCOUNTER — Other Ambulatory Visit: Payer: Self-pay | Admitting: Nurse Practitioner

## 2022-10-07 DIAGNOSIS — E1169 Type 2 diabetes mellitus with other specified complication: Secondary | ICD-10-CM

## 2022-10-09 ENCOUNTER — Other Ambulatory Visit: Payer: Self-pay | Admitting: Nurse Practitioner

## 2022-10-09 DIAGNOSIS — E1169 Type 2 diabetes mellitus with other specified complication: Secondary | ICD-10-CM

## 2022-10-09 NOTE — Telephone Encounter (Signed)
Rx 08/01/22 #90- too soon Requested Prescriptions  Pending Prescriptions Disp Refills   amLODipine (NORVASC) 10 MG tablet [Pharmacy Med Name: AMLODIPINE BESYLATE 10 MG Tablet] 90 tablet 3    Sig: TAKE 1 TABLET EVERY DAY     Cardiovascular: Calcium Channel Blockers 2 Failed - 10/07/2022  2:14 AM      Failed - Last BP in normal range    BP Readings from Last 1 Encounters:  09/26/22 (!) 144/60         Passed - Last Heart Rate in normal range    Pulse Readings from Last 1 Encounters:  09/26/22 (!) 59         Passed - Valid encounter within last 6 months    Recent Outpatient Visits           1 week ago Diabetes mellitus associated with hormonal etiology (HCC)   [place] [facility] Pearley, Sherran Needs, NP   3 months ago Hypertension associated with diabetes [facility])   [place] [facility] Larae Grooms, NP   5 months ago Aortic atherosclerosis [facility])   [place] [facility] Larae Grooms, NP   8 months ago Hypertension associated with diabetes [facility])   [place] [facility] Larae Grooms, NP   9 months ago Upper respiratory tract infection, unspecified type   [place] [facility] Mecum, Oswaldo Conroy, PA-C       Future Appointments             In 1 week Mecum, Oswaldo Conroy, PA-C [place] [facility], [facility]   In 2 months Mecum, Oswaldo Conroy, PA-C  [facility], [facility]

## 2022-10-10 NOTE — Telephone Encounter (Signed)
Requested Prescriptions  Pending Prescriptions Disp Refills   losartan (COZAAR) 100 MG tablet [Pharmacy Med Name: LOSARTAN POTASSIUM 100 MG Tablet] 90 tablet 0    Sig: TAKE 1 TABLET EVERY DAY     Cardiovascular:  Angiotensin Receptor Blockers Failed - 10/09/2022  1:44 PM      Failed - Last BP in normal range    BP Readings from Last 1 Encounters:  09/26/22 (!) 144/60         Passed - Cr in normal range and within 180 days    Creatinine, Ser  Date Value Ref Range Status  09/26/2022 1.16 0.76 - 1.27 mg/dL Final   Creatinine, Urine  Date Value Ref Range Status  03/05/2019 82 mg/dL Final    Comment:    Performed at [facility], [address] [place], [place] [address]         Passed - K in normal range and within 180 days    Potassium  Date Value Ref Range Status  09/26/2022 4.4 3.5 - 5.2 mmol/L Final         Passed - Patient is not pregnant      Passed - Valid encounter within last 6 months    Recent Outpatient Visits           2 weeks ago Diabetes mellitus associated with hormonal etiology (HCC)   [place] [facility] Pearley, Sherran Needs, NP   3 months ago Hypertension associated with diabetes [facility])   [place] [facility] Larae Grooms, NP   5 months ago Aortic atherosclerosis [facility])   [place] [facility] Larae Grooms, NP   8 months ago Hypertension associated with diabetes [facility])   [place] [facility] Larae Grooms, NP   9 months ago Upper respiratory tract infection, unspecified type   [place] [facility] Mecum, Oswaldo Conroy, PA-C       Future Appointments             In 1 week Mecum, Oswaldo Conroy, PA-C [place] [facility], [facility]   In 2 months Mecum, Oswaldo Conroy, PA-C  [facility], [facility]             tamsulosin (FLOMAX) 0.4 MG CAPS capsule [Pharmacy Med Name: TAMSULOSIN HYDROCHLORIDE 0.4 MG Capsule] 90 capsule 3    Sig: TAKE 1  CAPSULE EVERY DAY     Urology: Alpha-Adrenergic Blocker Failed - 10/09/2022  1:44 PM      Failed - PSA in normal range and within 360 days    Prostate Specific Ag, Serum  Date Value Ref Range Status  07/28/2020 2.4 0.0 - 4.0 ng/mL Final    Comment:    Roche ECLIA methodology. According to the American Urological Association, Serum PSA should decrease and remain at undetectable levels after radical prostatectomy. The AUA defines biochemical recurrence as an initial PSA value 0.2 ng/mL or greater followed by a subsequent confirmatory PSA value 0.2 ng/mL or greater. Values obtained with different assay methods or kits cannot be used interchangeably. Results cannot be interpreted as absolute evidence of the presence or absence of malignant disease.          Failed - Last BP in normal range    BP Readings from Last 1 Encounters:  09/26/22 (!) 144/60         Passed - Valid encounter within last 12 months    Recent Outpatient Visits  2 weeks ago Diabetes mellitus associated with hormonal etiology [facility])   [place] [facility] Mulat, Sherran Needs, NP   3 months ago Hypertension associated with diabetes [facility])   [place] [facility] Larae Grooms, NP   5 months ago Aortic atherosclerosis [facility])   [place] [facility] Larae Grooms, NP   8 months ago Hypertension associated with diabetes [facility])   [place] [facility] Larae Grooms, NP   9 months ago Upper respiratory tract infection, unspecified type   [place] [facility] Mecum, Oswaldo Conroy, PA-C       Future Appointments             In 1 week Mecum, Oswaldo Conroy, PA-C [place] [facility], [facility]   In 2 months Mecum, Oswaldo Conroy, PA-C  [facility], [facility]

## 2022-10-10 NOTE — Telephone Encounter (Signed)
Requested medication (s) are due for refill today: not quite  Requested medication (s) are on the active medication list: yes  Last refill:  08/01/22 #90/0  Future visit scheduled: yes  Notes to clinic:  Unable to refill per protocol due to failed labs, no updated results.      Requested Prescriptions  Pending Prescriptions Disp Refills   tamsulosin (FLOMAX) 0.4 MG CAPS capsule [Pharmacy Med Name: TAMSULOSIN HYDROCHLORIDE 0.4 MG Capsule] 90 capsule 3    Sig: TAKE 1 CAPSULE EVERY DAY     Urology: Alpha-Adrenergic Blocker Failed - 10/09/2022  1:44 PM      Failed - PSA in normal range and within 360 days    Prostate Specific Ag, Serum  Date Value Ref Range Status  07/28/2020 2.4 0.0 - 4.0 ng/mL Final    Comment:    Roche ECLIA methodology. According to the American Urological Association, Serum PSA should decrease and remain at undetectable levels after radical prostatectomy. The AUA defines biochemical recurrence as an initial PSA value 0.2 ng/mL or greater followed by a subsequent confirmatory PSA value 0.2 ng/mL or greater. Values obtained with different assay methods or kits cannot be used interchangeably. Results cannot be interpreted as absolute evidence of the presence or absence of malignant disease.          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)   [place] [facility] Pearley, Sherran Needs, NP   3 months ago Hypertension associated with diabetes [facility])   [place] [facility] Larae Grooms, NP   5 months ago Aortic atherosclerosis [facility])   [place] [facility] Larae Grooms, NP   8 months ago Hypertension associated with diabetes [facility])   [place] [facility] Larae Grooms,  NP   9 months ago Upper respiratory tract infection, unspecified type   [place] [facility] Mecum, Oswaldo Conroy, PA-C       Future Appointments             In 1 week Mecum, Oswaldo Conroy, PA-C [place] [facility], [facility]   In 2 months Mecum, Oswaldo Conroy, PA-C  [facility], [facility]            Signed Prescriptions Disp Refills   losartan (COZAAR) 100 MG tablet 90 tablet 0    Sig: TAKE 1 TABLET EVERY DAY     Cardiovascular:  Angiotensin Receptor Blockers Failed - 10/09/2022  1:44 PM      Failed - Last BP in normal range    BP Readings from Last 1 Encounters:  09/26/22 (!) 144/60         Passed - Cr in normal range and within 180 days    Creatinine, Ser  Date Value Ref Range Status  09/26/2022 1.16 0.76 - 1.27 mg/dL Final   Creatinine, Urine  Date Value Ref Range Status  03/05/2019 82 mg/dL Final    Comment:    Performed at [facility], [address] [place], [place] [address]         Passed - K in normal range and within 180 days    Potassium  Date Value Ref  Range Status  09/26/2022 4.4 3.5 - 5.2 mmol/L Final         Passed - Patient is not pregnant      Passed - Valid encounter within last 6 months    Recent Outpatient Visits           2 weeks ago Diabetes mellitus associated with hormonal etiology [facility])   [place] [facility] Pearley, Sherran Needs, NP   3 months ago Hypertension associated with diabetes [facility])   [place] [facility] Larae Grooms, NP   5 months ago Aortic atherosclerosis [facility])   [place] [facility] Larae Grooms, NP   8 months ago Hypertension associated with diabetes [facility])   [place] [facility] Larae Grooms, NP   9 months ago Upper respiratory tract infection, unspecified type   [place] [facility] Mecum, Oswaldo Conroy, PA-C       Future Appointments             In 1 week Mecum, Oswaldo Conroy, PA-C [facility], [facility]   In 2 months Mecum, Oswaldo Conroy, PA-C [place] [facility], [facility]

## 2022-10-17 ENCOUNTER — Ambulatory Visit (INDEPENDENT_AMBULATORY_CARE_PROVIDER_SITE_OTHER): Payer: Medicare PPO | Admitting: Physician Assistant

## 2022-10-17 ENCOUNTER — Encounter: Payer: Self-pay | Admitting: Physician Assistant

## 2022-10-17 VITALS — BP 129/68 | HR 56 | Temp 98.5°F | Wt 168.0 lb

## 2022-10-17 DIAGNOSIS — R0602 Shortness of breath: Secondary | ICD-10-CM | POA: Diagnosis not present

## 2022-10-17 DIAGNOSIS — D72829 Elevated white blood cell count, unspecified: Secondary | ICD-10-CM | POA: Diagnosis not present

## 2022-10-17 MED ORDER — AEROCHAMBER PLUS FLO-VU MISC: 2 refills | Status: DC

## 2022-10-17 MED ORDER — FLUTICASONE-SALMETEROL 100-50 MCG/ACT IN AEPB: 1.0000 | INHALATION_SPRAY | Freq: Two times a day (BID) | RESPIRATORY_TRACT | 3 refills | Status: DC

## 2022-10-17 NOTE — Patient Instructions (Addendum)
Your lung function testing shows that you have some moderate airway obstruction which could be the cause of your shortness of breath  I have sent in a script for a new inhaler for you to use every day. This is called Advair and you should use 2 puffs twice per day (every 12 hours)  Your albuterol inhaler is a rescue inhaler and should only be used as needed for times when you have shortness of breath. It should not be used every 6 hours every day. You can use it up to every 6 hours if it is needed for trouble breathing.   Please come back in 6 weeks but call us sooner if your breathing is getting worse despite using your inhalers.

## 2022-10-17 NOTE — Progress Notes (Signed)
Acute Office Visit   Patient: Carl Bryan   DOB: Dec 12, 1945   77 y.o. Male  MRN: [id number] Visit Date: 10/17/2022  Today's healthcare provider: Oswaldo Conroy Japhet Morgenthaler, PA-C  Introduced myself to the patient as a Secondary school teacher and provided education on APPs in clinical practice.    Chief Complaint  Patient presents with   Shortness of Breath    Patient says he is here for a follow up on SOB and it is about the same.    Fatigue   Subjective    HPI HPI     Shortness of Breath    Additional comments: Patient says he is here for a follow up on SOB and it is about the same.       Last edited by Malen Gauze, CMA on 10/17/2022 10:28 AM.       Follow up for SOBOE  Was given albuterol inhaler for this, 3 weeks ago   He reports he feels like he can hardly breathe after he gets up to do things He states she gets shortwinded while getting into his truck   He states he has been using his Albuterol inhaler every 6 hours but has not noticed improvement He reports he wants to sleep all the time as well  He states he feels tired all the time and this has been ongoing for the past several months  He states he sleeps good but has been told he snores on occasion  He is not aware if he has apneic events  PFTs demonstrate moderate airway obstruction    Medications: Outpatient Medications Prior to Visit  Medication Sig   albuterol (VENTOLIN HFA) 108 (90 Base) MCG/ACT inhaler Inhale 2 puffs into the lungs every 6 (six) hours as needed for wheezing or shortness of breath.   Alcohol Swabs (B-D SINGLE USE SWABS REGULAR) PADS 1 Application by Does not apply route in the morning, at noon, and at bedtime.   allopurinol (ZYLOPRIM) 300 MG tablet Take 1 tablet (300 mg total) by mouth daily.   amLODipine (NORVASC) 10 MG tablet TAKE 1 TABLET EVERY DAY   Blood Glucose Calibration (TRUE METRIX LEVEL 1) Low SOLN 1 Application by In Vitro route once a week.   Blood Glucose Calibration (TRUE METRIX LEVEL 3)  High SOLN 1 Application by In Vitro route once a week.   cholecalciferol 25 MCG (1000 UT) tablet TAKE 2 TABLETS EVERY DAY   cyanocobalamin (VITAMIN B12) 1000 MCG tablet Take 1 tablet (1,000 mcg total) by mouth daily.   empagliflozin (JARDIANCE) 10 MG TABS tablet Take 1 tablet (10 mg total) by mouth daily before breakfast.   glipiZIDE (GLUCOTROL) 5 MG tablet TAKE 1 TABLET EVERY DAY BEFORE BREAKFAST   glucose blood (ONETOUCH VERIO) test strip 1 each by Other route 2 (two) times daily. Use as instructed   losartan (COZAAR) 100 MG tablet TAKE 1 TABLET EVERY DAY   meloxicam (MOBIC) 15 MG tablet Take 1 tablet (15 mg total) by mouth daily.   metFORMIN (GLUCOPHAGE) 500 MG tablet TAKE 2 TABLETS TWICE DAILY WITH MEALS   pantoprazole (PROTONIX) 40 MG tablet TAKE 1 TABLET EVERY DAY   rosuvastatin (CRESTOR) 40 MG tablet Take 1 tablet (40 mg total) by mouth daily.   tamsulosin (FLOMAX) 0.4 MG CAPS capsule TAKE 1 CAPSULE EVERY DAY   No facility-administered medications prior to visit.    Review of Systems  Constitutional:  Positive for fatigue.  Respiratory:  Positive for  shortness of breath.        Objective    BP 129/68   Pulse (!) 56   Temp 98.5 F (36.9 C) (Oral)   Wt 168 lb (76.2 kg)   SpO2 91%   BMI 27.13 kg/m    Physical Exam Vitals reviewed.  Constitutional:      General: He is awake.     Appearance: Normal appearance. He is well-developed and well-groomed.  HENT:     Head: Normocephalic and atraumatic.  Eyes:     Extraocular Movements: Extraocular movements intact.  Cardiovascular:     Rate and Rhythm: Normal rate and regular rhythm.     Heart sounds: Normal heart sounds.  Pulmonary:     Effort: Pulmonary effort is normal.     Breath sounds: Decreased air movement present. Decreased breath sounds present. No wheezing, rhonchi or rales.  Musculoskeletal:     Cervical back: Normal range of motion and neck supple.     Right lower leg: No edema.     Left lower leg: No edema.      Right Lower Extremity: Right leg is amputated above knee.  Neurological:     Mental Status: He is alert.  Psychiatric:        Behavior: Behavior is cooperative.       No results found for any visits on 10/17/22.  Assessment & Plan      Return in about 6 weeks (around 11/28/2022) for SOB - inhalers added .     Problem List Items Addressed This Visit       Other   SOB (shortness of breath) on exertion - Primary    Acute on chronic, patient reports he has been feeling fatigued and SOBOE for the past several months  He was evaluated in office about 3 weeks ago and given Albuterol inhaler but he reports this has not been very beneficial  We reviewed appropriate use of his rescue inhaler and the results of his PFTs  Will also check CBC, CMP for potential anemia and electrolytes given fatigue I suspect likely COPD but cannot confirm at this time Will send him with Advair for maintenance and spacer to improve admin Recommend follow up in 6 weeks to discuss new inhaler or sooner if breathing is not improving/worsening       Relevant Medications   fluticasone-salmeterol (ADVAIR) 100-50 MCG/ACT AEPB   Spacer/Aero-Holding Chambers (AEROCHAMBER PLUS WITH MASK) inhaler   Other Relevant Orders   Comp Met (CMET)   CBC w/Diff   Spirometry with Graph (Completed)     Return in about 6 weeks (around 11/28/2022) for SOB - inhalers added .   I, Dyron Kawano E Jaydee Conran, PA-C, have reviewed all documentation for this visit. The documentation on 10/17/22 for the exam, diagnosis, procedures, and orders are all accurate and complete.   Jacquelin Hawking, MHS, PA-C [facility]

## 2022-10-17 NOTE — Assessment & Plan Note (Addendum)
Acute on chronic, patient reports he has been feeling fatigued and SOBOE for the past several months  He was evaluated in office about 3 weeks ago and given Albuterol inhaler but he reports this has not been very beneficial  We reviewed appropriate use of his rescue inhaler and the results of his PFTs  Will also check CBC, CMP for potential anemia and electrolytes given fatigue I suspect likely COPD but cannot confirm at this time Will send him with Advair for maintenance and spacer to improve admin Recommend follow up in 6 weeks to discuss new inhaler or sooner if breathing is not improving/worsening

## 2022-10-18 LAB — CBC WITH DIFFERENTIAL/PLATELET
Basophils Absolute: 0.1 10*3/uL (ref 0.0–0.2)
Basos: 1 %
EOS (ABSOLUTE): 0.3 10*3/uL (ref 0.0–0.4)
Eos: 3 %
Hematocrit: 40.3 % (ref 37.5–51.0)
Hemoglobin: 13.1 g/dL (ref 13.0–17.7)
Immature Grans (Abs): 0.1 10*3/uL (ref 0.0–0.1)
Immature Granulocytes: 1 %
Lymphocytes Absolute: 1.4 10*3/uL (ref 0.7–3.1)
Lymphs: 12 %
MCH: 29.6 pg (ref 26.6–33.0)
MCHC: 32.5 g/dL (ref 31.5–35.7)
MCV: 91 fL (ref 79–97)
Monocytes Absolute: 0.7 10*3/uL (ref 0.1–0.9)
Monocytes: 6 %
Neutrophils Absolute: 9.8 10*3/uL — ABNORMAL HIGH (ref 1.4–7.0)
Neutrophils: 77 %
Platelets: 249 10*3/uL (ref 150–450)
RBC: 4.43 x10E6/uL (ref 4.14–5.80)
RDW: 13.3 % (ref 11.6–15.4)
WBC: 12.3 10*3/uL — ABNORMAL HIGH (ref 3.4–10.8)

## 2022-10-18 LAB — COMPREHENSIVE METABOLIC PANEL
ALT: 16 IU/L (ref 0–44)
AST: 11 IU/L (ref 0–40)
Albumin: 4.5 g/dL (ref 3.8–4.8)
Alkaline Phosphatase: 71 IU/L (ref 44–121)
BUN/Creatinine Ratio: 14 (ref 10–24)
BUN: 21 mg/dL (ref 8–27)
Bilirubin Total: 0.6 mg/dL (ref 0.0–1.2)
CO2: 19 mmol/L — ABNORMAL LOW (ref 20–29)
Calcium: 10.3 mg/dL — ABNORMAL HIGH (ref 8.6–10.2)
Chloride: 97 mmol/L (ref 96–106)
Creatinine, Ser: 1.46 mg/dL — ABNORMAL HIGH (ref 0.76–1.27)
Globulin, Total: 2.3 g/dL (ref 1.5–4.5)
Glucose: 145 mg/dL — ABNORMAL HIGH (ref 70–99)
Potassium: 4.8 mmol/L (ref 3.5–5.2)
Sodium: 134 mmol/L (ref 134–144)
Total Protein: 6.8 g/dL (ref 6.0–8.5)
eGFR: 49 mL/min/{1.73_m2} — ABNORMAL LOW (ref >59)

## 2022-10-18 NOTE — Addendum Note (Signed)
Addended by: Jacquelin Hawking on: 10/18/2022 04:10 PM   Modules accepted: Orders

## 2022-10-18 NOTE — Progress Notes (Signed)
Your lab results are back and demonstrate the following: Your kidney function appears to have dropped a little bit.  Please make sure that you are staying well-hydrated to avoid kidney issues. At this time your white blood cells are elevated which is concerning for a potential infection. I have placed orders for a chest x-ray and a urinalysis.  Please stop by the office to give a urine sample so that we can make sure that you do not have a urinary tract infection.  Please go to [facility] to complete your chest x-ray as I want to make sure that your recent shortness of breath is not due to pneumonia.  Please let us know if you have further questions or concerns and we will keep you updated on the results of these new tests once they are back

## 2022-11-07 ENCOUNTER — Other Ambulatory Visit: Payer: Medicare PPO

## 2022-11-07 DIAGNOSIS — D72829 Elevated white blood cell count, unspecified: Secondary | ICD-10-CM

## 2022-11-07 LAB — URINALYSIS, ROUTINE W REFLEX MICROSCOPIC
Bilirubin, UA: NEGATIVE
Ketones, UA: NEGATIVE
Leukocytes,UA: NEGATIVE
Nitrite, UA: NEGATIVE
RBC, UA: NEGATIVE
Specific Gravity, UA: >1.03 — ABNORMAL HIGH (ref 1.005–1.030)
Urobilinogen, Ur: 1 mg/dL (ref 0.2–1.0)
pH, UA: 6 (ref 5.0–7.5)

## 2022-11-07 LAB — MICROSCOPIC EXAMINATION
Bacteria, UA: NONE SEEN
Epithelial Cells (non renal): NONE SEEN /hpf (ref 0–10)

## 2022-11-09 NOTE — Progress Notes (Signed)
No evidence of WBC or RBC in urine at this time Results appear consistent with his normal.

## 2022-11-28 ENCOUNTER — Ambulatory Visit (INDEPENDENT_AMBULATORY_CARE_PROVIDER_SITE_OTHER): Payer: Medicare PPO | Admitting: Physician Assistant

## 2022-11-28 ENCOUNTER — Encounter: Payer: Self-pay | Admitting: Physician Assistant

## 2022-11-28 VITALS — BP 138/70 | HR 55 | Wt 168.0 lb

## 2022-11-28 DIAGNOSIS — R5383 Other fatigue: Secondary | ICD-10-CM | POA: Diagnosis not present

## 2022-11-28 DIAGNOSIS — R0602 Shortness of breath: Secondary | ICD-10-CM

## 2022-11-28 MED ORDER — AIRSUPRA 90-80 MCG/ACT IN AERO
2.0000 | INHALATION_SPRAY | Freq: Four times a day (QID) | RESPIRATORY_TRACT | Status: AC | PRN
Start: 2022-11-28 — End: ?

## 2022-11-28 NOTE — Progress Notes (Unsigned)
Acute Office Visit   Patient: Carl Bryan   DOB: 1945/08/31   77 y.o. Male  MRN: [id number] Visit Date: 11/28/2022  Today's healthcare provider: Oswaldo Conroy Jaxyn Rout, PA-C  Introduced myself to the patient as a Secondary school teacher and provided education on APPs in clinical practice.    Chief Complaint  Patient presents with  . Shortness of Breath    Patient is here to follow up on Shortness of Breath. Medication was added at patient's last in office visit. Patient says the issues has gotten better. Patient says he notices when it is really hot outside, he can barely catch his breath.    . Sleeping Problem    Patient says he is noticing he is sleeping too much. Patient says after he eats, that he will need to go and lay down. Patient says he becomes drowsy and lazy.    Subjective    HPI HPI     Shortness of Breath    Additional comments: Patient is here to follow up on Shortness of Breath. Medication was added at patient's last in office visit. Patient says the issues has gotten better. Patient says he notices when it is really hot outside, he can barely catch his breath.          Sleeping Problem    Additional comments: Patient says he is noticing he is sleeping too much. Patient says after he eats, that he will need to go and lay down. Patient says he becomes drowsy and lazy.       Last edited by Malen Gauze, CMA on 11/28/2022 10:09 AM.        SOB  He reports it is doing better but he does have trouble breathing when the weather gets hot  He usually goes inside to rest He is using Advair twice per day  He states he has not used his rescue inhaler for a good while He states that the albuterol does not really seem to help much when he uses it  Will try sample of Airsupra today   Sleep Concerns He states he thinks he is sleeping too much Reports he is tired all the time  He has been told that he snores sometimes but denies being told of apneic events in the  past    Medications: Outpatient Medications Prior to Visit  Medication Sig  . albuterol (VENTOLIN HFA) 108 (90 Base) MCG/ACT inhaler Inhale 2 puffs into the lungs every 6 (six) hours as needed for wheezing or shortness of breath.  . Alcohol Swabs (B-D SINGLE USE SWABS REGULAR) PADS 1 Application by Does not apply route in the morning, at noon, and at bedtime.  Marland Kitchen allopurinol (ZYLOPRIM) 300 MG tablet Take 1 tablet (300 mg total) by mouth daily.  Marland Kitchen amLODipine (NORVASC) 10 MG tablet TAKE 1 TABLET EVERY DAY  . Blood Glucose Calibration (TRUE METRIX LEVEL 1) Low SOLN 1 Application by In Vitro route once a week.  . Blood Glucose Calibration (TRUE METRIX LEVEL 3) High SOLN 1 Application by In Vitro route once a week.  . cholecalciferol 25 MCG (1000 UT) tablet TAKE 2 TABLETS EVERY DAY  . cyanocobalamin (VITAMIN B12) 1000 MCG tablet Take 1 tablet (1,000 mcg total) by mouth daily.  . empagliflozin (JARDIANCE) 10 MG TABS tablet Take 1 tablet (10 mg total) by mouth daily before breakfast.  . fluticasone-salmeterol (ADVAIR) 100-50 MCG/ACT AEPB Inhale 1 puff into the lungs 2 (two) times daily.  Marland Kitchen  glipiZIDE (GLUCOTROL) 5 MG tablet TAKE 1 TABLET EVERY DAY BEFORE BREAKFAST  . glucose blood (ONETOUCH VERIO) test strip 1 each by Other route 2 (two) times daily. Use as instructed  . losartan (COZAAR) 100 MG tablet TAKE 1 TABLET EVERY DAY  . meloxicam (MOBIC) 15 MG tablet Take 1 tablet (15 mg total) by mouth daily.  . metFORMIN (GLUCOPHAGE) 500 MG tablet TAKE 2 TABLETS TWICE DAILY WITH MEALS  . pantoprazole (PROTONIX) 40 MG tablet TAKE 1 TABLET EVERY DAY  . rosuvastatin (CRESTOR) 40 MG tablet Take 1 tablet (40 mg total) by mouth daily.  Marland Kitchen Spacer/Aero-Holding Chambers (AEROCHAMBER PLUS WITH MASK) inhaler Use as instructed  . tamsulosin (FLOMAX) 0.4 MG CAPS capsule TAKE 1 CAPSULE EVERY DAY   No facility-administered medications prior to visit.    Review of Systems  {Insert previous labs  (optional):23779} {See past labs  Heme  Chem  Endocrine  Serology  Results Review (optional):1}   Objective    BP 138/70 (BP Location: Right Arm, Cuff Size: Normal)   Pulse (!) 55   Wt 168 lb (76.2 kg)   SpO2 97%   BMI 27.13 kg/m  {Insert last BP/Wt (optional):23777}{See vitals history (optional):1}   Physical Exam Vitals reviewed.  Constitutional:      General: He is awake.     Appearance: Normal appearance. He is well-developed and well-groomed.  HENT:     Head: Normocephalic and atraumatic.  Eyes:     Extraocular Movements: Extraocular movements intact.     Pupils: Pupils are equal, round, and reactive to light.  Pulmonary:     Effort: Pulmonary effort is normal.  Musculoskeletal:     Cervical back: Normal range of motion.  Neurological:     General: No focal deficit present.     Mental Status: He is alert and oriented to person, place, and time.  Psychiatric:        Attention and Perception: Attention and perception normal.        Mood and Affect: Mood and affect normal.        Speech: Speech normal.        Behavior: Behavior normal. Behavior is cooperative.      No results found for any visits on 11/28/22.  Assessment & Plan      No follow-ups on file.      Problem List Items Addressed This Visit       Other   SOB (shortness of breath) on exertion - Primary   Relevant Medications   Albuterol-Budesonide (AIRSUPRA) 90-80 MCG/ACT AERO   Other Visit Diagnoses     Fatigue, unspecified type       Relevant Orders   CBC w/Diff   Comp Met (CMET)   Ambulatory referral to Sleep Studies        No follow-ups on file.   I, Kinzly Pierrelouis E Stevens Magwood, PA-C, have reviewed all documentation for this visit. The documentation on 11/28/22 for the exam, diagnosis, procedures, and orders are all accurate and complete.   Jacquelin Hawking, MHS, PA-C [facility]

## 2022-11-28 NOTE — Patient Instructions (Signed)
I have given you a new rescue inhaler to use when you are having trouble breathing Please use this instead of your Albuterol inhaler Please continue to use your Advair twice per day as usual  The referral team should call you in the next 1-2 weeks to schedule the Sleep medicine apt/Sleep study. If you don't hear anything in the next 2 weeks please give our office a call so we can see what is going on.

## 2022-11-29 LAB — CBC WITH DIFFERENTIAL/PLATELET
Basophils Absolute: 0.1 10*3/uL (ref 0.0–0.2)
Basos: 1 %
EOS (ABSOLUTE): 0.3 10*3/uL (ref 0.0–0.4)
Eos: 4 %
Hematocrit: 39.5 % (ref 37.5–51.0)
Hemoglobin: 13.1 g/dL (ref 13.0–17.7)
Immature Grans (Abs): 0 10*3/uL (ref 0.0–0.1)
Immature Granulocytes: 1 %
Lymphocytes Absolute: 1.4 10*3/uL (ref 0.7–3.1)
Lymphs: 17 %
MCH: 28.9 pg (ref 26.6–33.0)
MCHC: 33.2 g/dL (ref 31.5–35.7)
MCV: 87 fL (ref 79–97)
Monocytes Absolute: 0.5 10*3/uL (ref 0.1–0.9)
Monocytes: 6 %
Neutrophils Absolute: 6.1 10*3/uL (ref 1.4–7.0)
Neutrophils: 71 %
Platelets: 226 10*3/uL (ref 150–450)
RBC: 4.54 x10E6/uL (ref 4.14–5.80)
RDW: 14.5 % (ref 11.6–15.4)
WBC: 8.4 10*3/uL (ref 3.4–10.8)

## 2022-11-29 LAB — COMPREHENSIVE METABOLIC PANEL
ALT: 25 IU/L (ref 0–44)
AST: 17 IU/L (ref 0–40)
Albumin: 4.5 g/dL (ref 3.8–4.8)
Alkaline Phosphatase: 68 IU/L (ref 44–121)
BUN/Creatinine Ratio: 12 (ref 10–24)
BUN: 15 mg/dL (ref 8–27)
Bilirubin Total: 0.4 mg/dL (ref 0.0–1.2)
CO2: 21 mmol/L (ref 20–29)
Calcium: 10.1 mg/dL (ref 8.6–10.2)
Chloride: 101 mmol/L (ref 96–106)
Creatinine, Ser: 1.27 mg/dL (ref 0.76–1.27)
Globulin, Total: 2.2 g/dL (ref 1.5–4.5)
Glucose: 128 mg/dL — ABNORMAL HIGH (ref 70–99)
Potassium: 4.7 mmol/L (ref 3.5–5.2)
Sodium: 136 mmol/L (ref 134–144)
Total Protein: 6.7 g/dL (ref 6.0–8.5)
eGFR: 58 mL/min/{1.73_m2} — ABNORMAL LOW (ref >59)

## 2022-11-29 NOTE — Progress Notes (Signed)
Labs are normal/stable.  Your electrolytes are in normal ranges and your liver function appears stable and normal Your kidney function looks to be improving- please make sure you are staying hydrated  Your CBC did not show signs of anemia or elevated white count.

## 2022-11-29 NOTE — Assessment & Plan Note (Signed)
Ongoing, reports some improvement since adding Advair but is still having some SOBOE, especially with increased temperatures He has been tying Albuterol inhaler as needed but reports limited improvement in symptoms until he goes inside to rest Will try adding Airsupra instead for rescue inhaler  Follow up in about 4 weeks to discuss response or sooner if concerns arise

## 2022-12-12 ENCOUNTER — Other Ambulatory Visit: Payer: Self-pay | Admitting: Nurse Practitioner

## 2022-12-12 DIAGNOSIS — E1169 Type 2 diabetes mellitus with other specified complication: Secondary | ICD-10-CM

## 2022-12-14 NOTE — Telephone Encounter (Signed)
Requested Prescriptions  Pending Prescriptions Disp Refills   tamsulosin (FLOMAX) 0.4 MG CAPS capsule [Pharmacy Med Name: Tamsulosin HCl Oral Capsule 0.4 MG] 90 capsule 0    Sig: TAKE 1 CAPSULE EVERY DAY     Urology: Alpha-Adrenergic Blocker Failed - 12/12/2022  4:43 PM      Failed - PSA in normal range and within 360 days    Prostate Specific Ag, Serum  Date Value Ref Range Status  07/28/2020 2.4 0.0 - 4.0 ng/mL Final    Comment:    Roche ECLIA methodology. According to the American Urological Association, Serum PSA should decrease and remain at undetectable levels after radical prostatectomy. The AUA defines biochemical recurrence as an initial PSA value 0.2 ng/mL or greater followed by a subsequent confirmatory PSA value 0.2 ng/mL or greater. Values obtained with different assay methods or kits cannot be used interchangeably. Results cannot be interpreted as absolute evidence of the presence or absence of malignant disease.          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)   [place] [facility] Pearley, Sherran Needs, NP   6 months ago Hypertension associated with diabetes [facility])   [place] [facility] Larae Grooms, NP   7 months ago Aortic atherosclerosis [facility])   [place] [facility] Larae Grooms, NP       Future Appointments             In 1 week Mecum, Oswaldo Conroy, PA-C [place] [facility], [facility]             amLODipine (NORVASC) 10 MG tablet [Pharmacy Med Name:  amLODIPine Besylate Oral Tablet 10 MG] 90 tablet 0    Sig: TAKE 1 TABLET EVERY DAY     Cardiovascular: Calcium Channel Blockers 2 Passed - 12/12/2022  4:43 PM      Passed - Last BP in normal range    BP Readings from Last 1 Encounters:  11/28/22 138/70         Passed - Last Heart Rate in normal range    Pulse Readings from Last 1 Encounters:  11/28/22 (!) 55         Passed - Valid encounter within last 6 months    Recent Outpatient Visits           2 weeks ago SOB (shortness of breath) on exertion   [place] [facility] Mecum, Erin E, PA-C   1 month ago SOB (shortness of breath) on exertion   [place] [facility] Mecum, Erin E, PA-C   2 months ago Diabetes mellitus associated with hormonal etiology [facility])    [facility] [place], Sherran Needs, NP   6 months ago Hypertension associated with diabetes [facility])    [facility] Larae Grooms, NP   7 months ago Aortic atherosclerosis (HCC)    [facility]  Larae Grooms, NP       Future Appointments             In 1 week Mecum, Oswaldo Conroy, PA-C [place] [facility], [facility]

## 2022-12-27 ENCOUNTER — Encounter: Payer: Self-pay | Admitting: Physician Assistant

## 2022-12-27 ENCOUNTER — Ambulatory Visit (INDEPENDENT_AMBULATORY_CARE_PROVIDER_SITE_OTHER): Payer: Medicare PPO | Admitting: Physician Assistant

## 2022-12-27 VITALS — BP 132/68 | HR 54 | Temp 97.6°F | Wt 167.6 lb

## 2022-12-27 DIAGNOSIS — R0602 Shortness of breath: Secondary | ICD-10-CM

## 2022-12-27 DIAGNOSIS — E785 Hyperlipidemia, unspecified: Secondary | ICD-10-CM

## 2022-12-27 DIAGNOSIS — Z23 Encounter for immunization: Secondary | ICD-10-CM

## 2022-12-27 DIAGNOSIS — Z7984 Long term (current) use of oral hypoglycemic drugs: Secondary | ICD-10-CM | POA: Diagnosis not present

## 2022-12-27 DIAGNOSIS — I152 Hypertension secondary to endocrine disorders: Secondary | ICD-10-CM | POA: Diagnosis not present

## 2022-12-27 DIAGNOSIS — E1169 Type 2 diabetes mellitus with other specified complication: Secondary | ICD-10-CM | POA: Diagnosis not present

## 2022-12-27 DIAGNOSIS — E1159 Type 2 diabetes mellitus with other circulatory complications: Secondary | ICD-10-CM

## 2022-12-27 NOTE — Assessment & Plan Note (Signed)
Improving with current regimen He reports he is using Advair BID and feels the Paulene Floor has been more effective for flares  Lung sounds are improved today from previous visits Continue current regimen Follow up in 3 months or sooner if concerns arise

## 2022-12-27 NOTE — Assessment & Plan Note (Signed)
Chronic, historic condition Appears controlled on current regimen comprised of Losartan 100 mg PO every day, Amlodipine 10 mg PO every day and appears to be tolerating well Continue current regimen Recommend checking BP daily and recording for monitoring at home  Follow up in 3 months or sooner if concerns arise

## 2022-12-27 NOTE — Assessment & Plan Note (Signed)
Chronic, historic condition Appears well managed on current regimen comprised of metformin 1000 mg PO BID, Glipizide 5 mg PO every day, Jardiance 10 mg po every day Continue current regimen Most recent A1c was 6.9 - recheck today. Results to dictate further management  Follow up in 3 months or sooner if concerns arise

## 2022-12-27 NOTE — Assessment & Plan Note (Signed)
Chronic, historic condition Appears well managed on current regimen comprised of Rosuvastatin 40 mg PO every day  Continue current regimen Recheck lipid panel at next apt Follow up in 3 months or sooner if concerns arise

## 2022-12-27 NOTE — Progress Notes (Signed)
Established Patient Office Visit  Name: Carl Bryan   MRN: [id number]    DOB: [birth year]   Date:12/27/2022  Today's Provider: Jacquelin Hawking, MHS, PA-C Introduced myself to the patient as a PA-C and provided education on APPs in clinical practice.         Subjective  Chief Complaint  Chief Complaint  Patient presents with   Hyperlipidemia   Diabetes    HPI  HYPERTENSION / HYPERLIPIDEMIA Satisfied with current treatment? yes Duration of hypertension: years BP monitoring frequency: rarely BP range:  BP medication side effects: no  BP meds: Losartan 100 mg PO every day, Amlodipine 10 mg PO every day,   Duration of hyperlipidemia: years Cholesterol medication side effects: no Cholesterol supplements: none Past cholesterol medications: rosuvastatin (crestor) Medication compliance: good compliance Aspirin: no Recent stressors: yes- his wife was recently diagnosed with breast cancer  Recurrent headaches: no Visual changes: no Palpitations: no Dyspnea: no Chest pain: no Lower extremity edema: no Dizzy/lightheaded: no  Diabetes, Type 2 - Last A1c 6.9 - Medications: metformin 1000 mg PO BID, Glipizide 5 mg PO every day, Jardiance 10 mg po every day,  - Compliance: Good  - Checking BG at home: unsure  - Eye exam: UTD - Foot exam: UTD - Microalbumin: UTD - Statin: On statin - PNA vaccine: completed  - Denies symptoms of hypoglycemia, polyuria, polydipsia, numbness extremities, foot ulcers/trauma  SOBOE  He reports his SOBOE has improved with inhaler use  He is using Airsupra twice per day and reports improved response with Airsupra for rescue   He reports he has been called about sleep apnea testing but when he tells the caller he is hard of hearing they hang up and he has stopped worrying about it    Patient Active Problem List   Diagnosis Date Noted   BPH associated with nocturia 01/15/2020   Hematuria 01/15/2020   Aortic atherosclerosis (HCC) 01/11/2020    B12 deficiency 12/18/2019   Urinary incontinence 12/18/2019   History of smoking 12/18/2019   GERD (gastroesophageal reflux disease) 12/12/2019   Vitamin D deficiency 07/28/2019   SOB (shortness of breath) on exertion    Skin lesions 03/28/2018   Deafness in right ear 03/28/2018   Arthritis of knee, left 09/26/2017   Advance care planning 08/03/2016   Gout 07/06/2015   Hyperlipidemia associated with type 2 diabetes mellitus (HCC) 09/29/2014   Diabetes mellitus associated with hormonal etiology (HCC) 09/29/2014   Hypertension associated with diabetes (HCC) 09/29/2014   CAD (coronary artery disease) 09/29/2014   Diverticulosis    Traumatic above-knee amputation of right lower extremity, subsequent encounter [facility])     Past Surgical History:  Procedure Laterality Date   ABOVE KNEE LEG AMPUTATION  04/10/1969   APPENDECTOMY     KNEE SURGERY     x 2.   REPLACEMENT TOTAL KNEE Left     Family History  Problem Relation Age of Onset   Cancer Mother        Colon cancer   Lung disease Father     Social History   Tobacco Use   Smoking status: Former    Current packs/day: 0.00    Types: Cigarettes    Quit date: 09/29/1964    Years since quitting: 58.2   Smokeless tobacco: Never  Substance Use Topics   Alcohol use: No    Alcohol/week: 0.0 standard drinks of alcohol     Current Outpatient Medications:    albuterol (  VENTOLIN HFA) 108 (90 Base) MCG/ACT inhaler, Inhale 2 puffs into the lungs every 6 (six) hours as needed for wheezing or shortness of breath., Disp: 8 g, Rfl: 0   Albuterol-Budesonide (AIRSUPRA) 90-80 MCG/ACT AERO, Inhale 2 puffs into the lungs every 6 (six) hours as needed., Disp: 5.9 g, Rfl:    Alcohol Swabs (B-D SINGLE USE SWABS REGULAR) PADS, 1 Application by Does not apply route in the morning, at noon, and at bedtime., Disp: 270 each, Rfl: 1   allopurinol (ZYLOPRIM) 300 MG tablet, Take 1 tablet (300 mg total) by mouth daily., Disp: 90 tablet, Rfl: 1    amLODipine (NORVASC) 10 MG tablet, TAKE 1 TABLET EVERY DAY, Disp: 90 tablet, Rfl: 0   Blood Glucose Calibration (TRUE METRIX LEVEL 1) Low SOLN, 1 Application by In Vitro route once a week., Disp: 1 each, Rfl: 0   Blood Glucose Calibration (TRUE METRIX LEVEL 3) High SOLN, 1 Application by In Vitro route once a week., Disp: 1 each, Rfl: 0   cholecalciferol 25 MCG (1000 UT) tablet, TAKE 2 TABLETS EVERY DAY, Disp: 180 tablet, Rfl: 1   cyanocobalamin (VITAMIN B12) 1000 MCG tablet, Take 1 tablet (1,000 mcg total) by mouth daily., Disp: 90 tablet, Rfl: 1   empagliflozin (JARDIANCE) 10 MG TABS tablet, Take 1 tablet (10 mg total) by mouth daily before breakfast., Disp: 90 tablet, Rfl: 1   fluticasone-salmeterol (ADVAIR) 100-50 MCG/ACT AEPB, Inhale 1 puff into the lungs 2 (two) times daily., Disp: 1 each, Rfl: 3   glipiZIDE (GLUCOTROL) 5 MG tablet, TAKE 1 TABLET EVERY DAY BEFORE BREAKFAST, Disp: 90 tablet, Rfl: 0   glucose blood (ONETOUCH VERIO) test strip, 1 each by Other route 2 (two) times daily. Use as instructed, Disp: 100 each, Rfl: 12   losartan (COZAAR) 100 MG tablet, TAKE 1 TABLET EVERY DAY, Disp: 90 tablet, Rfl: 0   meloxicam (MOBIC) 15 MG tablet, Take 1 tablet (15 mg total) by mouth daily., Disp: 90 tablet, Rfl: 1   metFORMIN (GLUCOPHAGE) 500 MG tablet, TAKE 2 TABLETS TWICE DAILY WITH MEALS, Disp: 360 tablet, Rfl: 1   pantoprazole (PROTONIX) 40 MG tablet, TAKE 1 TABLET EVERY DAY, Disp: 90 tablet, Rfl: 3   rosuvastatin (CRESTOR) 40 MG tablet, Take 1 tablet (40 mg total) by mouth daily., Disp: 90 tablet, Rfl: 0   Spacer/Aero-Holding Chambers (AEROCHAMBER PLUS WITH MASK) inhaler, Use as instructed, Disp: 1 each, Rfl: 2   tamsulosin (FLOMAX) 0.4 MG CAPS capsule, TAKE 1 CAPSULE EVERY DAY, Disp: 90 capsule, Rfl: 0  Allergies  Allergen Reactions   Doxycycline Calcium Rash   Tussionex Pennkinetic Er [Hydrocod Poli-Chlorphe Poli Er] Itching   Vytorin [Ezetimibe-Simvastatin]     Sleepy    I  personally reviewed active problem list, medication list, allergies, health maintenance, notes from last encounter, lab results with the patient/caregiver today.   ROS  See HPI for relevant ROS   Objective  Vitals:   12/27/22 0951 12/27/22 1001 12/27/22 1023  BP: (!) 170/70 (!) 173/75 132/68  Pulse: 63 (!) 54   Temp: 97.6 F (36.4 C)    TempSrc: Oral    SpO2: 96%    Weight: 167 lb 9.6 oz (76 kg)      Body mass index is 27.06 kg/m.  Physical Exam Vitals reviewed.  Constitutional:      General: He is awake.     Appearance: Normal appearance. He is well-developed and well-groomed.  HENT:     Head: Normocephalic and atraumatic.  Cardiovascular:  Rate and Rhythm: Normal rate and regular rhythm.     Pulses: Normal pulses.          Radial pulses are 2+ on the right side and 2+ on the left side.     Heart sounds: Normal heart sounds. No murmur heard.    No friction rub. No gallop.  Pulmonary:     Effort: Pulmonary effort is normal.     Breath sounds: Decreased air movement present. No decreased breath sounds, wheezing, rhonchi or rales.     Comments: Air movement seems reduced still without full inflation in lower lobes but is improved compared to previous exams  Musculoskeletal:     Cervical back: Normal range of motion.  Skin:    General: Skin is warm and dry.  Neurological:     Mental Status: He is alert.  Psychiatric:        Mood and Affect: Mood normal.        Behavior: Behavior normal. Behavior is cooperative.        Thought Content: Thought content normal.        Judgment: Judgment normal.      Recent Results (from the past 2160 hour(s))  Comp Met (CMET)     Status: Abnormal   Collection Time: 10/17/22 11:41 AM  Result Value Ref Range   Glucose 145 (H) 70 - 99 mg/dL   BUN 21 8 - 27 mg/dL   Creatinine, Ser 0.86 (H) 0.76 - 1.27 mg/dL   eGFR 49 (L) >57 QI/ONG/2.95   BUN/Creatinine Ratio 14 10 - 24   Sodium 134 134 - 144 mmol/L   Potassium 4.8 3.5 - 5.2  mmol/L   Chloride 97 96 - 106 mmol/L   CO2 19 (L) 20 - 29 mmol/L   Calcium 10.3 (H) 8.6 - 10.2 mg/dL   Total Protein 6.8 6.0 - 8.5 g/dL   Albumin 4.5 3.8 - 4.8 g/dL   Globulin, Total 2.3 1.5 - 4.5 g/dL   Bilirubin Total 0.6 0.0 - 1.2 mg/dL   Alkaline Phosphatase 71 44 - 121 IU/L   AST 11 0 - 40 IU/L   ALT 16 0 - 44 IU/L  CBC w/Diff     Status: Abnormal   Collection Time: 10/17/22 11:41 AM  Result Value Ref Range   WBC 12.3 (H) 3.4 - 10.8 x10E3/uL   RBC 4.43 4.14 - 5.80 x10E6/uL   Hemoglobin 13.1 13.0 - 17.7 g/dL   Hematocrit 28.4 13.2 - 51.0 %   MCV 91 79 - 97 fL   MCH 29.6 26.6 - 33.0 pg   MCHC 32.5 31.5 - 35.7 g/dL   RDW 44.0 10.2 - 72.5 %   Platelets 249 150 - 450 x10E3/uL   Neutrophils 77 Not Estab. %   Lymphs 12 Not Estab. %   Monocytes 6 Not Estab. %   Eos 3 Not Estab. %   Basos 1 Not Estab. %   Neutrophils Absolute 9.8 (H) 1.4 - 7.0 x10E3/uL   Lymphocytes Absolute 1.4 0.7 - 3.1 x10E3/uL   Monocytes Absolute 0.7 0.1 - 0.9 x10E3/uL   EOS (ABSOLUTE) 0.3 0.0 - 0.4 x10E3/uL   Basophils Absolute 0.1 0.0 - 0.2 x10E3/uL   Immature Granulocytes 1 Not Estab. %   Immature Grans (Abs) 0.1 0.0 - 0.1 x10E3/uL  Urinalysis, Routine w reflex microscopic     Status: Abnormal   Collection Time: 11/07/22  3:59 PM  Result Value Ref Range   Specific Gravity, UA >1.030 (H) 1.005 - 1.030  pH, UA 6.0 5.0 - 7.5   Color, UA Yellow Yellow   Appearance Ur Clear Clear   Leukocytes,UA Negative Negative   Protein,UA 2+ (A) Negative/Trace   Glucose, UA Trace (A) Negative   Ketones, UA Negative Negative   RBC, UA Negative Negative   Bilirubin, UA Negative Negative   Urobilinogen, Ur 1.0 0.2 - 1.0 mg/dL   Nitrite, UA Negative Negative   Microscopic Examination See below:   Microscopic Examination     Status: None   Collection Time: 11/07/22  3:59 PM   Urine  Result Value Ref Range   WBC, UA 0-5 0 - 5 /hpf   RBC, Urine 0-2 0 - 2 /hpf   Epithelial Cells (non renal) None seen 0 - 10 /hpf    Bacteria, UA None seen None seen/Few  CBC w/Diff     Status: None   Collection Time: 11/28/22 10:30 AM  Result Value Ref Range   WBC 8.4 3.4 - 10.8 x10E3/uL   RBC 4.54 4.14 - 5.80 x10E6/uL   Hemoglobin 13.1 13.0 - 17.7 g/dL   Hematocrit 41.3 24.4 - 51.0 %   MCV 87 79 - 97 fL   MCH 28.9 26.6 - 33.0 pg   MCHC 33.2 31.5 - 35.7 g/dL   RDW 01.0 27.2 - 53.6 %   Platelets 226 150 - 450 x10E3/uL   Neutrophils 71 Not Estab. %   Lymphs 17 Not Estab. %   Monocytes 6 Not Estab. %   Eos 4 Not Estab. %   Basos 1 Not Estab. %   Neutrophils Absolute 6.1 1.4 - 7.0 x10E3/uL   Lymphocytes Absolute 1.4 0.7 - 3.1 x10E3/uL   Monocytes Absolute 0.5 0.1 - 0.9 x10E3/uL   EOS (ABSOLUTE) 0.3 0.0 - 0.4 x10E3/uL   Basophils Absolute 0.1 0.0 - 0.2 x10E3/uL   Immature Granulocytes 1 Not Estab. %   Immature Grans (Abs) 0.0 0.0 - 0.1 x10E3/uL  Comp Met (CMET)     Status: Abnormal   Collection Time: 11/28/22 10:30 AM  Result Value Ref Range   Glucose 128 (H) 70 - 99 mg/dL   BUN 15 8 - 27 mg/dL   Creatinine, Ser 6.44 0.76 - 1.27 mg/dL   eGFR 58 (L) >03 KV/QQV/9.56   BUN/Creatinine Ratio 12 10 - 24   Sodium 136 134 - 144 mmol/L   Potassium 4.7 3.5 - 5.2 mmol/L   Chloride 101 96 - 106 mmol/L   CO2 21 20 - 29 mmol/L   Calcium 10.1 8.6 - 10.2 mg/dL   Total Protein 6.7 6.0 - 8.5 g/dL   Albumin 4.5 3.8 - 4.8 g/dL   Globulin, Total 2.2 1.5 - 4.5 g/dL   Bilirubin Total 0.4 0.0 - 1.2 mg/dL   Alkaline Phosphatase 68 44 - 121 IU/L   AST 17 0 - 40 IU/L   ALT 25 0 - 44 IU/L     PHQ2/9:    12/27/2022   10:00 AM 09/26/2022    8:51 AM 02/08/2022   10:04 AM 08/19/2021    9:24 AM 08/03/2021   10:42 AM  Depression screen PHQ 2/9  Decreased Interest 0 0 1 0 0  Down, Depressed, Hopeless 0 0 0 0 0  PHQ - 2 Score 0 0 1 0 0  Altered sleeping 0 1 1 0   Tired, decreased energy 1 1 1 1    Change in appetite 0 0 0 0   Feeling bad or failure about yourself  0 0 0 0  Trouble concentrating 0 0 0 0   Moving slowly or  fidgety/restless 0 0 0 0   Suicidal thoughts 0 0 0 0   PHQ-9 Score 1 2 3 1    Difficult doing work/chores Not difficult at all Not difficult at all Not difficult at all Not difficult at all       Fall Risk:    12/27/2022   10:00 AM 09/26/2022    8:50 AM 02/08/2022   10:04 AM 08/19/2021    9:24 AM 08/03/2021   10:41 AM  Fall Risk   Falls in the past year? 0 1 0 1 0  Number falls in past yr: 0 1 0 0 0  Injury with Fall? 0 0 0 0 0  Risk for fall due to : No Fall Risks Impaired balance/gait;Impaired mobility No Fall Risks History of fall(s) Impaired balance/gait  Follow up Falls evaluation completed Falls evaluation completed Falls evaluation completed Falls evaluation completed Falls evaluation completed;Education provided;Falls prevention discussed      Functional Status Survey:      Assessment & Plan  Problem List Items Addressed This Visit       Cardiovascular and Mediastinum   Hypertension associated with diabetes (HCC)    Chronic, historic condition Appears controlled on current regimen comprised of Losartan 100 mg PO every day, Amlodipine 10 mg PO every day and appears to be tolerating well Continue current regimen Recommend checking BP daily and recording for monitoring at home  Follow up in 3 months or sooner if concerns arise          Endocrine   Hyperlipidemia associated with type 2 diabetes mellitus (HCC)    Chronic, historic condition Appears well managed on current regimen comprised of Rosuvastatin 40 mg PO every day  Continue current regimen Recheck lipid panel at next apt Follow up in 3 months or sooner if concerns arise        Diabetes mellitus associated with hormonal etiology (HCC) - Primary    Chronic, historic condition Appears well managed on current regimen comprised of metformin 1000 mg PO BID, Glipizide 5 mg PO every day, Jardiance 10 mg po every day Continue current regimen Most recent A1c was 6.9 - recheck today. Results to dictate further  management  Follow up in 3 months or sooner if concerns arise        Relevant Orders   HgB A1c     Other   SOB (shortness of breath) on exertion    Improving with current regimen He reports he is using Advair BID and feels the Paulene Floor has been more effective for flares  Lung sounds are improved today from previous visits Continue current regimen Follow up in 3 months or sooner if concerns arise        Other Visit Diagnoses     Need for influenza vaccination       Relevant Orders   Flu Vaccine Trivalent High Dose (Fluad) (Completed)      He declines refills today saying he has plenty of his medications. Will recheck at next visit to make sure he has consistent access  Return in about 3 months (around 03/28/2023) for HLD, HTN, DM.   I, Crystel Demarco E Kaedan Richert, PA-C, have reviewed all documentation for this visit. The documentation on 12/27/22 for the exam, diagnosis, procedures, and orders are all accurate and complete.   Jacquelin Hawking, MHS, PA-C [facility]

## 2022-12-28 LAB — HEMOGLOBIN A1C
Est. average glucose Bld gHb Est-mCnc: 171 mg/dL
Hgb A1c MFr Bld: 7.6 % — ABNORMAL HIGH (ref 4.8–5.6)

## 2022-12-28 NOTE — Progress Notes (Signed)
Your A1c has increased to 7.6.  Please make sure you are taking your diabetes medications consistently and staying hydrated If it is still above goal at our next follow up we may need to change your medications a bit but I think we can keep them where they are right now.  Let us know if you have questions or concerns.

## 2023-02-11 ENCOUNTER — Other Ambulatory Visit: Payer: Self-pay | Admitting: Nurse Practitioner

## 2023-02-11 DIAGNOSIS — E1169 Type 2 diabetes mellitus with other specified complication: Secondary | ICD-10-CM

## 2023-02-12 NOTE — Telephone Encounter (Signed)
Requested Prescriptions  Pending Prescriptions Disp Refills   TRUE METRIX BLOOD GLUCOSE TEST test strip [Pharmacy Med Name: True Metrix Blood Glucose Test In Vitro Strip] 200 strip 3    Sig: TEST BLOOD SUGAR TWICE DAILY AS DIRECTED     Endocrinology: Diabetes - Testing Supplies Passed - 02/11/2023  7:34 AM      Passed - Valid encounter within last 12 months    Recent Outpatient Visits           1 month ago Diabetes mellitus associated with hormonal etiology (HCC)   [place] [facility] Mecum, Erin E, PA-C   2 months ago SOB (shortness of breath) on exertion   [place] [facility] Mecum, Erin E, PA-C   3 months ago SOB (shortness of breath) on exertion   [place] [facility] Mecum, Erin E, PA-C   4 months ago Diabetes mellitus associated with hormonal etiology [facility])   [place] [facility] Pearley, Sherran Needs, NP   8 months ago Hypertension associated with diabetes [facility])   [place] [facility] Larae Grooms, NP       Future Appointments             In 1 month Larae Grooms, NP [facility]

## 2023-02-21 ENCOUNTER — Other Ambulatory Visit: Payer: Self-pay | Admitting: Nurse Practitioner

## 2023-02-21 ENCOUNTER — Other Ambulatory Visit: Payer: Self-pay | Admitting: Family Medicine

## 2023-02-21 DIAGNOSIS — E1169 Type 2 diabetes mellitus with other specified complication: Secondary | ICD-10-CM

## 2023-02-22 NOTE — Telephone Encounter (Signed)
Requested medications are due for refill today.  yes  Requested medications are on the active medications list.  yes  Last refill. 09/26/2022 #90 1 rf  Future visit scheduled.   yes  Notes to clinic.  Labs are expired.    Requested Prescriptions  Pending Prescriptions Disp Refills   allopurinol (ZYLOPRIM) 300 MG tablet [Pharmacy Med Name: Allopurinol Oral Tablet 300 MG] 90 tablet 3    Sig: TAKE 1 TABLET EVERY DAY     Endocrinology:  Gout Agents - allopurinol Failed - 02/21/2023  3:29 AM      Failed - Uric Acid in normal range and within 360 days    Uric Acid  Date Value Ref Range Status  07/28/2020 2.4 (L) 3.8 - 8.4 mg/dL Final    Comment:               Therapeutic target for gout patients: <6.0         Passed - Cr in normal range and within 360 days    Creatinine, Ser  Date Value Ref Range Status  11/28/2022 1.27 0.76 - 1.27 mg/dL Final   Creatinine, Urine  Date Value Ref Range Status  03/05/2019 82 mg/dL Final    Comment:    Performed at [facility], [address], [place], [place] [address]         Passed - Valid encounter within last 12 months    Recent Outpatient Visits           1 month ago Diabetes mellitus associated with hormonal etiology (HCC)   [place] [facility] Mecum, Erin E, PA-C   2 months ago SOB (shortness of breath) on exertion   [place] [facility] Mecum, Erin E, PA-C   4 months ago SOB (shortness of breath) on exertion   [place] [facility] Mecum, Erin E, PA-C   4 months ago Diabetes mellitus associated with hormonal etiology (HCC)   [place] [facility] Pearley, Sherran Needs, NP   8 months ago Hypertension associated with diabetes [facility])   [place] [facility] Larae Grooms, NP       Future Appointments             In 1 month Larae Grooms, NP [place] [facility]            Passed - CBC within normal limits  and completed in the last 12 months    WBC  Date Value Ref Range Status  11/28/2022 8.4 3.4 - 10.8 x10E3/uL Final  03/10/2019 6.4 4.0 - 10.5 K/uL Final   RBC  Date Value Ref Range Status  11/28/2022 4.54 4.14 - 5.80 x10E6/uL Final  03/10/2019 4.14 (L) 4.22 - 5.81 MIL/uL Final   Hemoglobin  Date Value Ref Range Status  11/28/2022 13.1 13.0 - 17.7 g/dL Final   Hematocrit  Date Value Ref Range Status  11/28/2022 39.5 37.5 - 51.0 % Final   MCHC  Date Value Ref Range Status  11/28/2022 33.2 31.5 - 35.7 g/dL Final  84/13/2440 10.2 30.0 - 36.0 g/dL Final   [facility]  Date Value Ref Range Status  11/28/2022 28.9 26.6 - 33.0 pg Final  03/10/2019 31.4 26.0 - 34.0 pg Final   MCV  Date Value Ref Range Status  11/28/2022 87 79 - 97 fL Final   No results found for: "PLTCOUNTKUC", "LABPLAT", "POCPLA" RDW  Date Value Ref Range Status  11/28/2022 14.5 11.6 - 15.4 % Final  Signed Prescriptions Disp Refills   metFORMIN (GLUCOPHAGE) 500 MG tablet 360 tablet 1    Sig: TAKE 2 TABLETS TWICE A DAY WITH MEALS     Endocrinology:  Diabetes - Biguanides Failed - 02/21/2023  3:29 AM      Failed - eGFR in normal range and within 360 days    GFR calc Af Amer  Date Value Ref Range Status  01/15/2020 89 >59 mL/min/1.73 Final    Comment:    **[facility] currently reports eGFR in compliance with the current**   recommendations of the [employer]. [facility] will   update reporting as new guidelines are published from the NKF-ASN   Task force.    GFR calc non Af Amer  Date Value Ref Range Status  01/15/2020 77 >59 mL/min/1.73 Final   eGFR  Date Value Ref Range Status  11/28/2022 58 (L) >59 mL/min/1.73 Final         Failed - B12 Level in normal range and within 720 days    Vitamin B-12  Date Value Ref Range Status  09/26/2022 1,421 (H) 232 - 1,245 pg/mL Final         Passed - Cr in normal range and within 360 days    Creatinine, Ser  Date Value Ref Range Status   11/28/2022 1.27 0.76 - 1.27 mg/dL Final   Creatinine, Urine  Date Value Ref Range Status  03/05/2019 82 mg/dL Final    Comment:    Performed at [facility], [address] [place], [place] [address]         Passed - HBA1C is between 0 and 7.9 and within 180 days    HB A1C (BAYER DCA - WAIVED)  Date Value Ref Range Status  09/26/2022 6.9 (H) 4.8 - 5.6 % Final    Comment:             Prediabetes: 5.7 - 6.4          Diabetes: >6.4          Glycemic control for adults with diabetes: <7.0    Hgb A1c MFr Bld  Date Value Ref Range Status  12/27/2022 7.6 (H) 4.8 - 5.6 % Final    Comment:             Prediabetes: 5.7 - 6.4          Diabetes: >6.4          Glycemic control for adults with diabetes: <7.0          Passed - Valid encounter within last 6 months    Recent Outpatient Visits           1 month ago Diabetes mellitus associated with hormonal etiology (HCC)   [place] [facility] Mecum, Erin E, PA-C   2 months ago SOB (shortness of breath) on exertion   [place] [facility] Mecum, Erin E, PA-C   4 months ago SOB (shortness of breath) on exertion   [place] [facility] Mecum, Erin E, PA-C   4 months ago Diabetes mellitus associated with hormonal etiology [facility])   [place] [facility] Pearley, Sherran Needs, NP   8 months ago Hypertension associated with diabetes [facility])   [place] [facility] Larae Grooms, NP       Future Appointments             In 1 month Larae Grooms, NP [place] [facility]  Passed - CBC within normal limits and completed in the last 12 months    WBC  Date Value Ref Range Status  11/28/2022 8.4 3.4 - 10.8 x10E3/uL Final  03/10/2019 6.4 4.0 - 10.5 K/uL Final   RBC  Date Value Ref Range Status  11/28/2022 4.54 4.14 - 5.80 x10E6/uL Final  03/10/2019 4.14 (L) 4.22 - 5.81 MIL/uL Final   Hemoglobin  Date  Value Ref Range Status  11/28/2022 13.1 13.0 - 17.7 g/dL Final   Hematocrit  Date Value Ref Range Status  11/28/2022 39.5 37.5 - 51.0 % Final   MCHC  Date Value Ref Range Status  11/28/2022 33.2 31.5 - 35.7 g/dL Final  56/43/3295 18.8 30.0 - 36.0 g/dL Final   [facility]  Date Value Ref Range Status  11/28/2022 28.9 26.6 - 33.0 pg Final  03/10/2019 31.4 26.0 - 34.0 pg Final   MCV  Date Value Ref Range Status  11/28/2022 87 79 - 97 fL Final   No results found for: "PLTCOUNTKUC", "LABPLAT", "POCPLA" RDW  Date Value Ref Range Status  11/28/2022 14.5 11.6 - 15.4 % Final

## 2023-02-22 NOTE — Telephone Encounter (Signed)
Requested Prescriptions  Pending Prescriptions Disp Refills   rosuvastatin (CRESTOR) 40 MG tablet [Pharmacy Med Name: Rosuvastatin Calcium Oral Tablet 40 MG] 90 tablet 2    Sig: TAKE 1 TABLET EVERY DAY     Cardiovascular:  Antilipid - Statins 2 Failed - 02/21/2023  3:29 AM      Failed - Lipid Panel in normal range within the last 12 months    Cholesterol, Total  Date Value Ref Range Status  09/26/2022 124 100 - 199 mg/dL Final   Cholesterol Piccolo, Waived  Date Value Ref Range Status  03/28/2018 122 <200 mg/dL Final    Comment:                            Desirable                <200                         Borderline High      200- 239                         High                     >239    LDL Chol Calc (NIH)  Date Value Ref Range Status  09/26/2022 68 0 - 99 mg/dL Final   HDL  Date Value Ref Range Status  09/26/2022 38 (L) >39 mg/dL Final   Triglycerides  Date Value Ref Range Status  09/26/2022 95 0 - 149 mg/dL Final   Triglycerides Piccolo,Waived  Date Value Ref Range Status  03/28/2018 232 (H) <150 mg/dL Final    Comment:                            Normal                   <150                         Borderline High     150 - 199                         High                200 - 499                         Very High                >499          Passed - Cr in normal range and within 360 days    Creatinine, Ser  Date Value Ref Range Status  11/28/2022 1.27 0.76 - 1.27 mg/dL Final   Creatinine, Urine  Date Value Ref Range Status  03/05/2019 82 mg/dL Final    Comment:    Performed at [facility], [address] [place], [place] [address]         Passed - Patient is not pregnant      Passed - Valid encounter within last 12 months    Recent Outpatient Visits           1 month ago Diabetes mellitus associated with hormonal etiology (HCC)   [place]  [facility] Mecum, Erin E, PA-C   2 months ago SOB (shortness of breath) on  exertion   [place] [facility] Mecum, Erin E, PA-C   4 months ago SOB (shortness of breath) on exertion   [place] [facility] Mecum, Erin E, PA-C   4 months ago Diabetes mellitus associated with hormonal etiology [facility])   [place] [facility] [place], Sherran Needs, NP   8 months ago Hypertension associated with diabetes [facility])   [place] [facility] Larae Grooms, NP       Future Appointments             In 1 month Larae Grooms, NP [place] [facility]             amLODipine (NORVASC) 10 MG tablet [Pharmacy Med Name: amLODIPine Besylate Oral Tablet 10 MG] 90 tablet 0    Sig: TAKE 1 TABLET EVERY DAY     Cardiovascular: Calcium Channel Blockers 2 Passed - 02/21/2023  3:29 AM      Passed - Last BP in normal range    BP Readings from Last 1 Encounters:  12/27/22 132/68         Passed - Last Heart Rate in normal range    Pulse Readings from Last 1 Encounters:  12/27/22 (!) 54         Passed - Valid encounter within last 6 months    Recent Outpatient Visits           1 month ago Diabetes mellitus associated with hormonal etiology (HCC)   [place] [facility] Mecum, Erin E, PA-C   2 months ago SOB (shortness of breath) on exertion   [place] [facility] Mecum, Erin E, PA-C   4 months ago SOB (shortness of breath) on exertion   [place] [facility] Mecum, Erin E, PA-C   4 months ago Diabetes mellitus associated with hormonal etiology [facility])   [place] [facility] [place], Sherran Needs, NP   8 months ago Hypertension associated with diabetes [facility])   [place] [facility] Larae Grooms, NP       Future Appointments             In 1 month Larae Grooms, NP  [facility]             tamsulosin (FLOMAX) 0.4 MG CAPS capsule [Pharmacy Med Name: Tamsulosin HCl  Oral Capsule 0.4 MG] 90 capsule 2    Sig: TAKE 1 CAPSULE EVERY DAY     Urology: Alpha-Adrenergic Blocker Failed - 02/21/2023  3:29 AM      Failed - PSA in normal range and within 360 days    Prostate Specific Ag, Serum  Date Value Ref Range Status  07/28/2020 2.4 0.0 - 4.0 ng/mL Final    Comment:    Roche ECLIA methodology. According to the American Urological Association, Serum PSA should decrease and remain at undetectable levels after radical prostatectomy. The AUA defines biochemical recurrence as an initial PSA value 0.2 ng/mL or greater followed by a subsequent confirmatory PSA value 0.2 ng/mL or greater. Values obtained with different assay methods or kits cannot be used interchangeably. Results cannot be interpreted as absolute evidence of the presence or absence of malignant disease.          Passed - Last BP in normal range    BP Readings from Last 1 Encounters:  12/27/22 132/68         Passed - Valid encounter within last 12 months    Recent Outpatient Visits           1 month ago Diabetes mellitus associated with hormonal etiology (HCC)   [place] [facility] Mecum, Erin E, PA-C   2 months ago SOB (shortness of breath) on exertion   [place] [facility] Mecum, Erin E, PA-C   4 months ago SOB (shortness of breath) on exertion   [place] [facility] Mecum, Erin E, PA-C   4 months ago Diabetes mellitus associated with hormonal etiology (HCC)   [place] [facility] Pearley, Sherran Needs, NP   8 months ago Hypertension associated with diabetes [facility])   [place] [facility] Larae Grooms, NP       Future Appointments             In 1 month Larae Grooms, NP [place] [facility]             glipiZIDE (GLUCOTROL) 5 MG tablet [Pharmacy Med Name: glipiZIDE Oral Tablet 5 MG] 90 tablet 0    Sig: TAKE 1 TABLET EVERY DAY BEFORE BREAKFAST      Endocrinology:  Diabetes - Sulfonylureas Passed - 02/21/2023  3:29 AM      Passed - HBA1C is between 0 and 7.9 and within 180 days    HB A1C (BAYER DCA - WAIVED)  Date Value Ref Range Status  09/26/2022 6.9 (H) 4.8 - 5.6 % Final    Comment:             Prediabetes: 5.7 - 6.4          Diabetes: >6.4          Glycemic control for adults with diabetes: <7.0    Hgb A1c MFr Bld  Date Value Ref Range Status  12/27/2022 7.6 (H) 4.8 - 5.6 % Final    Comment:             Prediabetes: 5.7 - 6.4          Diabetes: >6.4          Glycemic control for adults with diabetes: <7.0          Passed - Cr in normal range and within 360 days    Creatinine, Ser  Date Value Ref Range Status  11/28/2022 1.27 0.76 - 1.27 mg/dL Final   Creatinine, Urine  Date Value Ref Range Status  03/05/2019 82 mg/dL Final    Comment:    Performed at [place] [facility], [address] [place], [place] [address]         Passed - Valid encounter within last 6 months    Recent Outpatient Visits           1 month ago Diabetes mellitus associated with hormonal etiology (HCC)   [place] [facility] Mecum, Erin E, PA-C   2 months ago SOB (shortness of breath) on exertion   [place] [facility] Mecum, Erin E, PA-C   4 months ago SOB (shortness of breath) on exertion   [place] [facility] Mecum, Erin E, PA-C   4 months ago Diabetes mellitus associated with hormonal etiology [facility])   [place] [facility] [place], Sherran Needs, NP   8 months ago Hypertension associated with diabetes [facility])   [place] [facility] Larae Grooms, NP  Future Appointments             In 1 month Larae Grooms, NP [place] [facility]             losartan (COZAAR) 100 MG tablet [Pharmacy Med Name: Losartan Potassium Oral Tablet 100 MG] 90 tablet 1    Sig: TAKE 1 TABLET EVERY DAY     Cardiovascular:   Angiotensin Receptor Blockers Passed - 02/21/2023  3:29 AM      Passed - Cr in normal range and within 180 days    Creatinine, Ser  Date Value Ref Range Status  11/28/2022 1.27 0.76 - 1.27 mg/dL Final   Creatinine, Urine  Date Value Ref Range Status  03/05/2019 82 mg/dL Final    Comment:    Performed at [facility], [address], [place], [place] [address]         Passed - K in normal range and within 180 days    Potassium  Date Value Ref Range Status  11/28/2022 4.7 3.5 - 5.2 mmol/L Final         Passed - Patient is not pregnant      Passed - Last BP in normal range    BP Readings from Last 1 Encounters:  12/27/22 132/68         Passed - Valid encounter within last 6 months    Recent Outpatient Visits           1 month ago Diabetes mellitus associated with hormonal etiology (HCC)   [place] [facility] Mecum, Erin E, PA-C   2 months ago SOB (shortness of breath) on exertion   [place] [facility] Mecum, Erin E, PA-C   4 months ago SOB (shortness of breath) on exertion   [place] [facility] Mecum, Erin E, PA-C   4 months ago Diabetes mellitus associated with hormonal etiology [facility])   [place] [facility] [place], Sherran Needs, NP   8 months ago Hypertension associated with diabetes [facility])   [place] [facility] Larae Grooms, NP       Future Appointments             In 1 month Larae Grooms, NP [place] [facility], [facility]

## 2023-02-22 NOTE — Telephone Encounter (Signed)
Requested Prescriptions  Pending Prescriptions Disp Refills   allopurinol (ZYLOPRIM) 300 MG tablet [Pharmacy Med Name: Allopurinol Oral Tablet 300 MG] 90 tablet 3    Sig: TAKE 1 TABLET EVERY DAY     Endocrinology:  Gout Agents - allopurinol Failed - 02/21/2023  3:29 AM      Failed - Uric Acid in normal range and within 360 days    Uric Acid  Date Value Ref Range Status  07/28/2020 2.4 (L) 3.8 - 8.4 mg/dL Final    Comment:               Therapeutic target for gout patients: <6.0         Passed - Cr in normal range and within 360 days    Creatinine, Ser  Date Value Ref Range Status  11/28/2022 1.27 0.76 - 1.27 mg/dL Final   Creatinine, Urine  Date Value Ref Range Status  03/05/2019 82 mg/dL Final    Comment:    Performed at [facility], [address], [place], [place] [address]         Passed - Valid encounter within last 12 months    Recent Outpatient Visits           1 month ago Diabetes mellitus associated with hormonal etiology (HCC)   [place] [facility] Mecum, Erin E, PA-C   2 months ago SOB (shortness of breath) on exertion   [place] [facility] Mecum, Erin E, PA-C   4 months ago SOB (shortness of breath) on exertion   [place] [facility] Mecum, Erin E, PA-C   4 months ago Diabetes mellitus associated with hormonal etiology (HCC)   [place] [facility] Pearley, Sherran Needs, NP   8 months ago Hypertension associated with diabetes [facility])   [place] [facility] Larae Grooms, NP       Future Appointments             In 1 month Larae Grooms, NP [place] [facility]            Passed - CBC within normal limits and completed in the last 12 months    WBC  Date Value Ref Range Status  11/28/2022 8.4 3.4 - 10.8 x10E3/uL Final  03/10/2019 6.4 4.0 - 10.5 K/uL Final   RBC  Date Value Ref Range Status  11/28/2022 4.54 4.14 - 5.80 x10E6/uL  Final  03/10/2019 4.14 (L) 4.22 - 5.81 MIL/uL Final   Hemoglobin  Date Value Ref Range Status  11/28/2022 13.1 13.0 - 17.7 g/dL Final   Hematocrit  Date Value Ref Range Status  11/28/2022 39.5 37.5 - 51.0 % Final   MCHC  Date Value Ref Range Status  11/28/2022 33.2 31.5 - 35.7 g/dL Final  02/04/2535 64.4 30.0 - 36.0 g/dL Final   [facility]  Date Value Ref Range Status  11/28/2022 28.9 26.6 - 33.0 pg Final  03/10/2019 31.4 26.0 - 34.0 pg Final   MCV  Date Value Ref Range Status  11/28/2022 87 79 - 97 fL Final   No results found for: "PLTCOUNTKUC", "LABPLAT", "POCPLA" RDW  Date Value Ref Range Status  11/28/2022 14.5 11.6 - 15.4 % Final          metFORMIN (GLUCOPHAGE) 500 MG tablet [Pharmacy Med Name: metFORMIN HCl Oral Tablet 500 MG] 360 tablet 3    Sig: TAKE 2 TABLETS TWICE A DAY WITH MEALS     Endocrinology:  Diabetes -  Biguanides Failed - 02/21/2023  3:29 AM      Failed - eGFR in normal range and within 360 days    GFR calc Af Amer  Date Value Ref Range Status  01/15/2020 89 >59 mL/min/1.73 Final    Comment:    **[facility] currently reports eGFR in compliance with the current**   recommendations of the [employer]. [facility] will   update reporting as new guidelines are published from the NKF-ASN   Task force.    GFR calc non Af Amer  Date Value Ref Range Status  01/15/2020 77 >59 mL/min/1.73 Final   eGFR  Date Value Ref Range Status  11/28/2022 58 (L) >59 mL/min/1.73 Final         Failed - B12 Level in normal range and within 720 days    Vitamin B-12  Date Value Ref Range Status  09/26/2022 1,421 (H) 232 - 1,245 pg/mL Final         Passed - Cr in normal range and within 360 days    Creatinine, Ser  Date Value Ref Range Status  11/28/2022 1.27 0.76 - 1.27 mg/dL Final   Creatinine, Urine  Date Value Ref Range Status  03/05/2019 82 mg/dL Final    Comment:    Performed at [facility], [address] [place], [place] [address]          Passed - HBA1C is between 0 and 7.9 and within 180 days    HB A1C (BAYER DCA - WAIVED)  Date Value Ref Range Status  09/26/2022 6.9 (H) 4.8 - 5.6 % Final    Comment:             Prediabetes: 5.7 - 6.4          Diabetes: >6.4          Glycemic control for adults with diabetes: <7.0    Hgb A1c MFr Bld  Date Value Ref Range Status  12/27/2022 7.6 (H) 4.8 - 5.6 % Final    Comment:             Prediabetes: 5.7 - 6.4          Diabetes: >6.4          Glycemic control for adults with diabetes: <7.0          Passed - Valid encounter within last 6 months    Recent Outpatient Visits           1 month ago Diabetes mellitus associated with hormonal etiology (HCC)   [place] [facility] Mecum, Erin E, PA-C   2 months ago SOB (shortness of breath) on exertion   [place] [facility] Mecum, Erin E, PA-C   4 months ago SOB (shortness of breath) on exertion   [place] [facility] Mecum, Erin E, PA-C   4 months ago Diabetes mellitus associated with hormonal etiology [facility])   [place] [facility] Pearley, Sherran Needs, NP   8 months ago Hypertension associated with diabetes [facility])   [place] [facility] Larae Grooms, NP       Future Appointments             In 1 month Larae Grooms, NP [place] [facility]            Passed - CBC within normal limits and completed in the last 12 months    WBC  Date Value Ref Range Status  11/28/2022 8.4  3.4 - 10.8 x10E3/uL Final  03/10/2019 6.4 4.0 - 10.5 K/uL Final   RBC  Date Value Ref Range Status  11/28/2022 4.54 4.14 - 5.80 x10E6/uL Final  03/10/2019 4.14 (L) 4.22 - 5.81 MIL/uL Final   Hemoglobin  Date Value Ref Range Status  11/28/2022 13.1 13.0 - 17.7 g/dL Final   Hematocrit  Date Value Ref Range Status  11/28/2022 39.5 37.5 - 51.0 % Final   MCHC  Date Value Ref Range Status  11/28/2022 33.2 31.5 - 35.7 g/dL Final   09/81/1914 78.2 30.0 - 36.0 g/dL Final   [facility]  Date Value Ref Range Status  11/28/2022 28.9 26.6 - 33.0 pg Final  03/10/2019 31.4 26.0 - 34.0 pg Final   MCV  Date Value Ref Range Status  11/28/2022 87 79 - 97 fL Final   No results found for: "PLTCOUNTKUC", "LABPLAT", "POCPLA" RDW  Date Value Ref Range Status  11/28/2022 14.5 11.6 - 15.4 % Final

## 2023-03-27 NOTE — Progress Notes (Unsigned)
There were no vitals taken for this visit.   Subjective:    Patient ID: Carl Bryan, male    DOB: 1946/01/02, 77 y.o.   MRN: [id number]  HPI: Carl Bryan is a 77 y.o. male  No chief complaint on file.  HYPERTENSION / HYPERLIPIDEMIA Satisfied with current treatment? yes Duration of hypertension: years BP monitoring frequency: not checking BP range:  BP medication side effects: no Past BP meds: amlodipine and losartan (cozaar) Duration of hyperlipidemia: years Cholesterol medication side effects: no Cholesterol supplements: none Past cholesterol medications: rosuvastatin (crestor) Medication compliance: excellent compliance Aspirin: no Recent stressors: no Recurrent headaches: no Visual changes: no Palpitations: no Dyspnea: no Chest pain: no Lower extremity edema: no Dizzy/lightheaded: no  DIABETES Denies concerns during visit today.  Takes metformin and glipizide, Jardiance. has had issues with obtaining a prescription refill.  Hypoglycemic episodes:no Polydipsia/polyuria: no Visual disturbance: no Chest pain: no Paresthesias: no Glucose Monitoring: yes  Accucheck frequency: Daily;  Fasting glucose: 99-140  Post prandial:  Evening:  Before meals: Taking Insulin?: no  Long acting insulin:  Short acting insulin: Blood Pressure Monitoring: daily Retinal Examination: Has an appt this month;  Foot Exam: up to date Diabetic Education: Completed today Pneumovax: Up to Date Influenza: Completed today Aspirin: no  Relevant past medical, surgical, family and social history reviewed and updated as indicated. Interim medical history since our last visit reviewed. Allergies and medications reviewed and updated.  Review of Systems  Constitutional:  Negative for activity change, appetite change, chills, fever and unexpected weight change.  Eyes:  Negative for visual disturbance.  Respiratory:  Negative for cough, chest tightness and shortness of breath.    Cardiovascular:  Negative for chest pain, palpitations and leg swelling.  Gastrointestinal:  Negative for diarrhea.  Endocrine: Negative for polydipsia and polyuria.  Musculoskeletal:  Negative for joint swelling.  Neurological:  Negative for dizziness, light-headedness, numbness and headaches.  Psychiatric/Behavioral:  Negative for sleep disturbance and suicidal ideas. The patient is not nervous/anxious.     Per HPI unless specifically indicated above     Objective:    There were no vitals taken for this visit.  Wt Readings from Last 3 Encounters:  12/27/22 167 lb 9.6 oz (76 kg)  11/28/22 168 lb (76.2 kg)  10/17/22 168 lb (76.2 kg)    Physical Exam Vitals and nursing note reviewed.  Constitutional:      General: He is not in acute distress.    Appearance: Normal appearance. He is not ill-appearing, toxic-appearing or diaphoretic.  HENT:     Nose: No congestion or rhinorrhea.  Eyes:     General:        Right eye: No discharge.        Left eye: No discharge.     Pupils: Pupils are equal, round, and reactive to light.  Cardiovascular:     Rate and Rhythm: Normal rate. Rhythm irregular.     Pulses:          Dorsalis pedis pulses are 1+ on the left side.       Posterior tibial pulses are 1+ on the left side.     Heart sounds: No murmur heard. Pulmonary:     Effort: Pulmonary effort is normal. No respiratory distress.     Breath sounds: Normal breath sounds. No wheezing, rhonchi or rales.  Musculoskeletal:     Cervical back: Normal range of motion and neck supple.     Comments: Uses crutches due to R AKA.  Right Lower Extremity: Right leg is amputated above knee.  Feet:     Left foot:     Protective Sensation: 10 sites tested.  10 sites sensed.     Skin integrity: Dry skin present. No skin breakdown or warmth.     Toenail Condition: Left toenails are abnormally thick. Fungal disease present. Skin:    General: Skin is warm and dry.     Capillary Refill: Capillary  refill takes less than 2 seconds.  Neurological:     General: No focal deficit present.     Mental Status: He is alert and oriented to person, place, and time.  Psychiatric:        Mood and Affect: Mood normal.        Behavior: Behavior normal.        Thought Content: Thought content normal.        Judgment: Judgment normal.    Results for orders placed or performed in visit on 12/27/22  HgB A1c   Collection Time: 12/27/22 10:31 AM  Result Value Ref Range   Hgb A1c MFr Bld 7.6 (H) 4.8 - 5.6 %   Est. average glucose Bld gHb Est-mCnc 171 mg/dL      Assessment & Plan:   Problem List Items Addressed This Visit       Cardiovascular and Mediastinum   Hypertension associated with diabetes (HCC)   CAD (coronary artery disease)   Aortic atherosclerosis (HCC) - Primary     Endocrine   Hyperlipidemia associated with type 2 diabetes mellitus (HCC)   Diabetes mellitus associated with hormonal etiology (HCC)     Other   Vitamin D deficiency     Follow up plan: No follow-ups on file.

## 2023-03-28 ENCOUNTER — Other Ambulatory Visit: Payer: Self-pay

## 2023-03-28 ENCOUNTER — Ambulatory Visit: Payer: Medicare PPO | Admitting: Nurse Practitioner

## 2023-03-28 ENCOUNTER — Ambulatory Visit (INDEPENDENT_AMBULATORY_CARE_PROVIDER_SITE_OTHER): Payer: Medicare PPO | Admitting: Family Medicine

## 2023-03-28 ENCOUNTER — Encounter: Payer: Self-pay | Admitting: Nurse Practitioner

## 2023-03-28 VITALS — BP 138/88 | HR 66 | Temp 98.1°F | Ht 65.0 in | Wt 164.0 lb

## 2023-03-28 DIAGNOSIS — I2583 Coronary atherosclerosis due to lipid rich plaque: Secondary | ICD-10-CM

## 2023-03-28 DIAGNOSIS — Z23 Encounter for immunization: Secondary | ICD-10-CM | POA: Diagnosis not present

## 2023-03-28 DIAGNOSIS — E1169 Type 2 diabetes mellitus with other specified complication: Secondary | ICD-10-CM

## 2023-03-28 DIAGNOSIS — I700 Atherosclerosis of aorta: Secondary | ICD-10-CM | POA: Diagnosis not present

## 2023-03-28 DIAGNOSIS — E1159 Type 2 diabetes mellitus with other circulatory complications: Secondary | ICD-10-CM | POA: Diagnosis not present

## 2023-03-28 DIAGNOSIS — E559 Vitamin D deficiency, unspecified: Secondary | ICD-10-CM

## 2023-03-28 DIAGNOSIS — I152 Hypertension secondary to endocrine disorders: Secondary | ICD-10-CM

## 2023-03-28 DIAGNOSIS — E785 Hyperlipidemia, unspecified: Secondary | ICD-10-CM | POA: Diagnosis not present

## 2023-03-28 DIAGNOSIS — Z7984 Long term (current) use of oral hypoglycemic drugs: Secondary | ICD-10-CM

## 2023-03-28 DIAGNOSIS — I2510 Atherosclerotic heart disease of native coronary artery without angina pectoris: Secondary | ICD-10-CM

## 2023-03-28 LAB — MICROALBUMIN, URINE WAIVED
Creatinine, Urine Waived: 50 mg/dL (ref 10–300)
Microalb, Ur Waived: 150 mg/L — ABNORMAL HIGH (ref 0–19)
Microalb/Creat Ratio: >300 mg/g — ABNORMAL HIGH (ref <30)

## 2023-03-28 NOTE — Assessment & Plan Note (Signed)
Chronic.  Controlled.  Continue with current medication regimen of Crestor 40mg.  Labs ordered today.  Return to clinic in 6 months for reevaluation.  Call sooner if concerns arise.   

## 2023-03-28 NOTE — Assessment & Plan Note (Signed)
Labs ordered at visit today.  Will make recommendations based on lab results.   

## 2023-03-28 NOTE — Progress Notes (Signed)
 Carl Bryan arrived 03/28/2023 and has given verbal consent to obtain images and complete their overdue diabetic retinal screening.  The images have been sent to an ophthalmologist or optometrist for review and interpretation.  Results will be sent back to Carl Bryan for review.  Patient has been informed they will be contacted when we receive the results via telephone or MyChart  Could not get image due to eyes unable to dialte.

## 2023-03-28 NOTE — Assessment & Plan Note (Signed)
Chronic.  Controlled.  Continue current regimen of Crestor 40mg . Continue to monitor. Labs drawn today.  Follow up in 3 months.  Call sooner if concerns arise.

## 2023-03-28 NOTE — Assessment & Plan Note (Signed)
Chronic.  Last A1c was 7.6%.  Will check labs at visit today. Still taking Metformin 1000mg  BID and Jardiance.  Can consider adding GLP1 if not well controlled.  Continue with current medication regimen.  Labs ordered today.  Return to clinic in 3 months for reevaluation.  Call sooner if concerns arise.

## 2023-03-28 NOTE — Assessment & Plan Note (Signed)
Chronic, historic condition Appears controlled on current regimen comprised of Losartan 100 mg PO every day, Amlodipine 10 mg PO every day and appears to be tolerating well Continue current regimen Recommend checking BP daily and recording for monitoring at home  Follow up in 3 months or sooner if concerns arise

## 2023-03-28 NOTE — Assessment & Plan Note (Signed)
Chronic, historic condition Appears well managed on current regimen comprised of Rosuvastatin 40 mg PO every day  Continue current regimen Recheck lipid panel at next apt Follow up in 3 months or sooner if concerns arise

## 2023-03-29 LAB — COMPREHENSIVE METABOLIC PANEL
ALT: 15 [IU]/L (ref 0–44)
AST: 11 [IU]/L (ref 0–40)
Albumin: 4.5 g/dL (ref 3.8–4.8)
Alkaline Phosphatase: 61 [IU]/L (ref 44–121)
BUN/Creatinine Ratio: 9 — ABNORMAL LOW (ref 10–24)
BUN: 12 mg/dL (ref 8–27)
Bilirubin Total: 0.6 mg/dL (ref 0.0–1.2)
CO2: 22 mmol/L (ref 20–29)
Calcium: 10.2 mg/dL (ref 8.6–10.2)
Chloride: 100 mmol/L (ref 96–106)
Creatinine, Ser: 1.32 mg/dL — ABNORMAL HIGH (ref 0.76–1.27)
Globulin, Total: 2.5 g/dL (ref 1.5–4.5)
Glucose: 108 mg/dL — ABNORMAL HIGH (ref 70–99)
Potassium: 4.3 mmol/L (ref 3.5–5.2)
Sodium: 137 mmol/L (ref 134–144)
Total Protein: 7 g/dL (ref 6.0–8.5)
eGFR: 55 mL/min/{1.73_m2} — ABNORMAL LOW (ref >59)

## 2023-03-29 LAB — HEMOGLOBIN A1C
Est. average glucose Bld gHb Est-mCnc: 174 mg/dL
Hgb A1c MFr Bld: 7.7 % — ABNORMAL HIGH (ref 4.8–5.6)

## 2023-03-29 LAB — VITAMIN D 25 HYDROXY (VIT D DEFICIENCY, FRACTURES): Vit D, 25-Hydroxy: 30.3 ng/mL (ref 30.0–100.0)

## 2023-03-29 LAB — LIPID PANEL
Chol/HDL Ratio: 3.9 {ratio} (ref 0.0–5.0)
Cholesterol, Total: 130 mg/dL (ref 100–199)
HDL: 33 mg/dL — ABNORMAL LOW (ref >39)
LDL Chol Calc (NIH): 68 mg/dL (ref 0–99)
Triglycerides: 166 mg/dL — ABNORMAL HIGH (ref 0–149)
VLDL Cholesterol Cal: 29 mg/dL (ref 5–40)

## 2023-05-06 ENCOUNTER — Other Ambulatory Visit: Payer: Self-pay | Admitting: Nurse Practitioner

## 2023-05-06 DIAGNOSIS — E1169 Type 2 diabetes mellitus with other specified complication: Secondary | ICD-10-CM

## 2023-05-08 NOTE — Telephone Encounter (Signed)
Requested Prescriptions  Pending Prescriptions Disp Refills   glipiZIDE (GLUCOTROL) 5 MG tablet [Pharmacy Med Name: glipiZIDE Oral Tablet 5 MG] 90 tablet 1    Sig: TAKE 1 TABLET EVERY DAY BEFORE BREAKFAST     Endocrinology:  Diabetes - Sulfonylureas Failed - 05/08/2023  4:20 PM      Failed - Cr in normal range and within 360 days    Creatinine, Ser  Date Value Ref Range Status  03/28/2023 1.32 (H) 0.76 - 1.27 mg/dL Final   Creatinine, Urine  Date Value Ref Range Status  03/05/2019 82 mg/dL Final    Comment:    Performed at [facility], [address] [place], [place] [address]         Passed - HBA1C is between 0 and 7.9 and within 180 days    HB A1C (BAYER DCA - WAIVED)  Date Value Ref Range Status  09/26/2022 6.9 (H) 4.8 - 5.6 % Final    Comment:             Prediabetes: 5.7 - 6.4          Diabetes: >6.4          Glycemic control for adults with diabetes: <7.0    Hgb A1c MFr Bld  Date Value Ref Range Status  03/28/2023 7.7 (H) 4.8 - 5.6 % Final    Comment:             Prediabetes: 5.7 - 6.4          Diabetes: >6.4          Glycemic control for adults with diabetes: <7.0          Passed - Valid encounter within last 6 months    Recent Outpatient Visits           1 month ago Aortic atherosclerosis (HCC)   [place] [facility] [place], Clydie Braun, NP   4 months ago Diabetes mellitus associated with hormonal etiology (HCC)   [place] [facility] Mecum, Erin E, PA-C   5 months ago SOB (shortness of breath) on exertion   [place] [facility] Mecum, Erin E, PA-C   6 months ago SOB (shortness of breath) on exertion   [place] [facility] Mecum, Erin E, PA-C   7 months ago Diabetes mellitus associated with hormonal etiology [facility])   [place] [facility] [place], Sherran Needs, NP       Future Appointments             In 1 month Larae Grooms, NP [place] [facility]             amLODipine (NORVASC) 10 MG tablet [Pharmacy Med Name: amLODIPine Besylate Oral Tablet 10 MG] 90 tablet 1    Sig: TAKE 1 TABLET EVERY DAY     Cardiovascular: Calcium Channel Blockers 2 Passed - 05/08/2023  4:20 PM      Passed - Last BP in normal range    BP Readings from Last 1 Encounters:  03/28/23 138/88         Passed - Last Heart Rate in normal range    Pulse Readings from Last 1 Encounters:  03/28/23 66         Passed - Valid encounter within last 6 months    Recent Outpatient Visits           1 month ago Aortic atherosclerosis (HCC)   [place] [facility] Larae Grooms, NP   4 months ago Diabetes mellitus associated with hormonal etiology (HCC)   [place] [facility] Mecum, Erin E, PA-C   5 months ago SOB (shortness of breath) on exertion   [place] [facility] Mecum, Erin E, PA-C   6 months ago SOB (shortness of breath) on exertion   [place] [facility] Mecum, Erin E, PA-C   7 months ago Diabetes mellitus associated with hormonal etiology [facility])   [place] [facility] Weber Cooks, NP       Future Appointments             In 1 month Larae Grooms, NP [place] [facility], [facility]

## 2023-06-03 ENCOUNTER — Other Ambulatory Visit: Payer: Self-pay | Admitting: Nurse Practitioner

## 2023-06-05 NOTE — Telephone Encounter (Signed)
 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

## 2023-06-26 ENCOUNTER — Ambulatory Visit: Payer: Medicare PPO | Admitting: Nurse Practitioner

## 2023-06-26 NOTE — Progress Notes (Deleted)
 There were no vitals taken for this visit.   Subjective:    Patient ID: Carl Bryan, male    DOB: May 09, 1945, 78 y.o.   MRN: [id number]  HPI: Carl Bryan is a 78 y.o. male  No chief complaint on file.  HYPERTENSION / HYPERLIPIDEMIA Satisfied with current treatment? yes Duration of hypertension: years BP monitoring frequency: daily BP range: 135/80 BP medication side effects: no Past BP meds: amlodipine and losartan (cozaar) Duration of hyperlipidemia: years Cholesterol medication side effects: no Cholesterol supplements: none Past cholesterol medications: rosuvastatin (crestor) Medication compliance: excellent compliance Aspirin: no Recent stressors: no Recurrent headaches: no Visual changes: no Palpitations: no Dyspnea: no Chest pain: no Lower extremity edema: no Dizzy/lightheaded: no  DIABETES Denies concerns during visit today.  Takes metformin and glipizide, Jardiance. has had issues with obtaining a prescription refill.  Hypoglycemic episodes:no Polydipsia/polyuria: no Visual disturbance: no Chest pain: no Paresthesias: no Glucose Monitoring: yes  Accucheck frequency: Daily;  Fasting glucose:<100  Post prandial:  Evening:  Before meals: Taking Insulin?: no  Long acting insulin:  Short acting insulin: Blood Pressure Monitoring: daily Retinal Examination: Done today Foot Exam: up to date Diabetic Education: Completed today Pneumovax: Up to Date Influenza: Completed today Aspirin: no  Relevant past medical, surgical, family and social history reviewed and updated as indicated. Interim medical history since our last visit reviewed. Allergies and medications reviewed and updated.  Review of Systems  Constitutional:  Negative for activity change, appetite change, chills, fever and unexpected weight change.  Eyes:  Negative for visual disturbance.  Respiratory:  Negative for cough, chest tightness and shortness of breath.   Cardiovascular:  Negative for  chest pain, palpitations and leg swelling.  Gastrointestinal:  Negative for diarrhea.  Endocrine: Negative for polydipsia and polyuria.  Musculoskeletal:  Negative for joint swelling.  Neurological:  Negative for dizziness, light-headedness, numbness and headaches.  Psychiatric/Behavioral:  Negative for sleep disturbance and suicidal ideas. The patient is not nervous/anxious.     Per HPI unless specifically indicated above     Objective:    There were no vitals taken for this visit.  Wt Readings from Last 3 Encounters:  03/28/23 164 lb (74.4 kg)  12/27/22 167 lb 9.6 oz (76 kg)  11/28/22 168 lb (76.2 kg)    Physical Exam Vitals and nursing note reviewed.  Constitutional:      General: He is not in acute distress.    Appearance: Normal appearance. He is not ill-appearing, toxic-appearing or diaphoretic.  HENT:     Nose: No congestion or rhinorrhea.  Eyes:     General:        Right eye: No discharge.        Left eye: No discharge.     Pupils: Pupils are equal, round, and reactive to light.  Cardiovascular:     Rate and Rhythm: Normal rate. Rhythm irregular.     Pulses:          Dorsalis pedis pulses are 1+ on the left side.       Posterior tibial pulses are 1+ on the left side.     Heart sounds: No murmur heard. Pulmonary:     Effort: Pulmonary effort is normal. No respiratory distress.     Breath sounds: Normal breath sounds. No wheezing, rhonchi or rales.  Musculoskeletal:     Cervical back: Normal range of motion and neck supple.     Comments: Uses crutches due to R AKA.     Right Lower  Extremity: Right leg is amputated above knee.  Feet:     Left foot:     Protective Sensation: 10 sites tested.  10 sites sensed.     Skin integrity: Dry skin present. No skin breakdown or warmth.     Toenail Condition: Left toenails are abnormally thick. Fungal disease present. Skin:    General: Skin is warm and dry.     Capillary Refill: Capillary refill takes less than 2 seconds.   Neurological:     General: No focal deficit present.     Mental Status: He is alert and oriented to person, place, and time.  Psychiatric:        Mood and Affect: Mood normal.        Behavior: Behavior normal.        Thought Content: Thought content normal.        Judgment: Judgment normal.    Results for orders placed or performed in visit on 03/28/23  Microalbumin, Urine Waived   Collection Time: 03/28/23 11:12 AM  Result Value Ref Range   Microalb, Ur Waived 150 (H) 0 - 19 mg/L   Creatinine, Urine Waived 50 10 - 300 mg/dL   Microalb/Creat Ratio >300 (H) <30 mg/g  Comp Met (CMET)   Collection Time: 03/28/23 11:17 AM  Result Value Ref Range   Glucose 108 (H) 70 - 99 mg/dL   BUN 12 8 - 27 mg/dL   Creatinine, Ser 0.98 (H) 0.76 - 1.27 mg/dL   eGFR 55 (L) >11 BJ/YNW/2.95   BUN/Creatinine Ratio 9 (L) 10 - 24   Sodium 137 134 - 144 mmol/L   Potassium 4.3 3.5 - 5.2 mmol/L   Chloride 100 96 - 106 mmol/L   CO2 22 20 - 29 mmol/L   Calcium 10.2 8.6 - 10.2 mg/dL   Total Protein 7.0 6.0 - 8.5 g/dL   Albumin 4.5 3.8 - 4.8 g/dL   Globulin, Total 2.5 1.5 - 4.5 g/dL   Bilirubin Total 0.6 0.0 - 1.2 mg/dL   Alkaline Phosphatase 61 44 - 121 IU/L   AST 11 0 - 40 IU/L   ALT 15 0 - 44 IU/L  Lipid Profile   Collection Time: 03/28/23 11:17 AM  Result Value Ref Range   Cholesterol, Total 130 100 - 199 mg/dL   Triglycerides 621 (H) 0 - 149 mg/dL   HDL 33 (L) >30 mg/dL   VLDL Cholesterol Cal 29 5 - 40 mg/dL   LDL Chol Calc (NIH) 68 0 - 99 mg/dL   Chol/HDL Ratio 3.9 0.0 - 5.0 ratio  HgB A1c   Collection Time: 03/28/23 11:17 AM  Result Value Ref Range   Hgb A1c MFr Bld 7.7 (H) 4.8 - 5.6 %   Est. average glucose Bld gHb Est-mCnc 174 mg/dL  Vitamin D (25 hydroxy)   Collection Time: 03/28/23 11:17 AM  Result Value Ref Range   Vit D, 25-Hydroxy 30.3 30.0 - 100.0 ng/mL      Assessment & Plan:   Problem List Items Addressed This Visit       Cardiovascular and Mediastinum   Hypertension  associated with diabetes (HCC)   CAD (coronary artery disease)   Aortic atherosclerosis (HCC)     Endocrine   Hyperlipidemia associated with type 2 diabetes mellitus (HCC) - Primary   Diabetes mellitus associated with hormonal etiology (HCC)     Other   Traumatic above-knee amputation of right lower extremity, subsequent encounter (HCC)     Follow up plan: No follow-ups  on file.

## 2023-07-20 ENCOUNTER — Other Ambulatory Visit: Payer: Self-pay | Admitting: Nurse Practitioner

## 2023-07-20 DIAGNOSIS — E1169 Type 2 diabetes mellitus with other specified complication: Secondary | ICD-10-CM

## 2023-07-20 NOTE — Telephone Encounter (Signed)
 Requested Prescriptions  Pending Prescriptions Disp Refills   allopurinol (ZYLOPRIM) 300 MG tablet [Pharmacy Med Name: Allopurinol Oral Tablet 300 MG] 90 tablet 3    Sig: TAKE 1 TABLET EVERY DAY     Endocrinology:  Gout Agents - allopurinol Failed - 07/20/2023  2:09 PM      Failed - Uric Acid in normal range and within 360 days    Uric Acid  Date Value Ref Range Status  07/28/2020 2.4 (L) 3.8 - 8.4 mg/dL Final    Comment:               Therapeutic target for gout patients: <6.0         Failed - Cr in normal range and within 360 days    Creatinine, Ser  Date Value Ref Range Status  03/28/2023 1.32 (H) 0.76 - 1.27 mg/dL Final   Creatinine, Urine  Date Value Ref Range Status  03/05/2019 82 mg/dL Final    Comment:    Performed at [facility], [address] [place], [place] [address]         Failed - Valid encounter within last 12 months    Recent Outpatient Visits   None            Passed - CBC within normal limits and completed in the last 12 months    WBC  Date Value Ref Range Status  11/28/2022 8.4 3.4 - 10.8 x10E3/uL Final  03/10/2019 6.4 4.0 - 10.5 K/uL Final   RBC  Date Value Ref Range Status  11/28/2022 4.54 4.14 - 5.80 x10E6/uL Final  03/10/2019 4.14 (L) 4.22 - 5.81 MIL/uL Final   Hemoglobin  Date Value Ref Range Status  11/28/2022 13.1 13.0 - 17.7 g/dL Final   Hematocrit  Date Value Ref Range Status  11/28/2022 39.5 37.5 - 51.0 % Final   MCHC  Date Value Ref Range Status  11/28/2022 33.2 31.5 - 35.7 g/dL Final  62/95/2841 32.4 30.0 - 36.0 g/dL Final   [facility]  Date Value Ref Range Status  11/28/2022 28.9 26.6 - 33.0 pg Final  03/10/2019 31.4 26.0 - 34.0 pg Final   MCV  Date Value Ref Range Status  11/28/2022 87 79 - 97 fL Final   No results found for: "PLTCOUNTKUC", "LABPLAT", "POCPLA" RDW  Date Value Ref Range Status  11/28/2022 14.5 11.6 - 15.4 % Final          metFORMIN (GLUCOPHAGE) 500 MG tablet [Pharmacy Med Name: metFORMIN  HCl Oral Tablet 500 MG] 360 tablet 0    Sig: TAKE 2 TABLETS TWICE DAILY WITH MEALS     Endocrinology:  Diabetes - Biguanides Failed - 07/20/2023  2:09 PM      Failed - Cr in normal range and within 360 days    Creatinine, Ser  Date Value Ref Range Status  03/28/2023 1.32 (H) 0.76 - 1.27 mg/dL Final   Creatinine, Urine  Date Value Ref Range Status  03/05/2019 82 mg/dL Final    Comment:    Performed at [facility], [address] [place], [place] [address]         Failed - eGFR in normal range and within 360 days    GFR calc Af Amer  Date Value Ref Range Status  01/15/2020 89 >59 mL/min/1.73 Final    Comment:    **[facility] currently reports eGFR in compliance with the current**   recommendations of the [employer]. [facility] will   update reporting as new guidelines  are published from the NKF-ASN   Task force.    GFR calc non Af Amer  Date Value Ref Range Status  01/15/2020 77 >59 mL/min/1.73 Final   eGFR  Date Value Ref Range Status  03/28/2023 55 (L) >59 mL/min/1.73 Final         Failed - B12 Level in normal range and within 720 days    Vitamin B-12  Date Value Ref Range Status  09/26/2022 1,421 (H) 232 - 1,245 pg/mL Final         Failed - Valid encounter within last 6 months    Recent Outpatient Visits   None            Passed - HBA1C is between 0 and 7.9 and within 180 days    HB A1C (BAYER DCA - WAIVED)  Date Value Ref Range Status  09/26/2022 6.9 (H) 4.8 - 5.6 % Final    Comment:             Prediabetes: 5.7 - 6.4          Diabetes: >6.4          Glycemic control for adults with diabetes: <7.0    Hgb A1c MFr Bld  Date Value Ref Range Status  03/28/2023 7.7 (H) 4.8 - 5.6 % Final    Comment:             Prediabetes: 5.7 - 6.4          Diabetes: >6.4          Glycemic control for adults with diabetes: <7.0          Passed - CBC within normal limits and completed in the last 12 months    WBC  Date Value Ref Range Status   11/28/2022 8.4 3.4 - 10.8 x10E3/uL Final  03/10/2019 6.4 4.0 - 10.5 K/uL Final   RBC  Date Value Ref Range Status  11/28/2022 4.54 4.14 - 5.80 x10E6/uL Final  03/10/2019 4.14 (L) 4.22 - 5.81 MIL/uL Final   Hemoglobin  Date Value Ref Range Status  11/28/2022 13.1 13.0 - 17.7 g/dL Final   Hematocrit  Date Value Ref Range Status  11/28/2022 39.5 37.5 - 51.0 % Final   MCHC  Date Value Ref Range Status  11/28/2022 33.2 31.5 - 35.7 g/dL Final  54/12/8117 14.7 30.0 - 36.0 g/dL Final   [facility]  Date Value Ref Range Status  11/28/2022 28.9 26.6 - 33.0 pg Final  03/10/2019 31.4 26.0 - 34.0 pg Final   MCV  Date Value Ref Range Status  11/28/2022 87 79 - 97 fL Final   No results found for: "PLTCOUNTKUC", "LABPLAT", "POCPLA" RDW  Date Value Ref Range Status  11/28/2022 14.5 11.6 - 15.4 % Final          losartan (COZAAR) 100 MG tablet [Pharmacy Med Name: Losartan Potassium Oral Tablet 100 MG] 90 tablet 0    Sig: TAKE 1 TABLET EVERY DAY     Cardiovascular:  Angiotensin Receptor Blockers Failed - 07/20/2023  2:09 PM      Failed - Cr in normal range and within 180 days    Creatinine, Ser  Date Value Ref Range Status  03/28/2023 1.32 (H) 0.76 - 1.27 mg/dL Final   Creatinine, Urine  Date Value Ref Range Status  03/05/2019 82 mg/dL Final    Comment:    Performed at [facility], [address], [place], [place] [address]         Failed -  Valid encounter within last 6 months    Recent Outpatient Visits   None            Passed - K in normal range and within 180 days    Potassium  Date Value Ref Range Status  03/28/2023 4.3 3.5 - 5.2 mmol/L Final         Passed - Patient is not pregnant      Passed - Last BP in normal range    BP Readings from Last 1 Encounters:  03/28/23 138/88

## 2023-07-20 NOTE — Telephone Encounter (Signed)
 Requested medications are due for refill today.  yes  Requested medications are on the active medications list.  yes  Last refill. 11/14/20224 #90 1 rf  Future visit scheduled.   no  Notes to clinic.  Expired labs.     Requested Prescriptions  Pending Prescriptions Disp Refills   allopurinol (ZYLOPRIM) 300 MG tablet [Pharmacy Med Name: Allopurinol Oral Tablet 300 MG] 90 tablet 3    Sig: TAKE 1 TABLET EVERY DAY     Endocrinology:  Gout Agents - allopurinol Failed - 07/20/2023  2:10 PM      Failed - Uric Acid in normal range and within 360 days    Uric Acid  Date Value Ref Range Status  07/28/2020 2.4 (L) 3.8 - 8.4 mg/dL Final    Comment:               Therapeutic target for gout patients: <6.0         Failed - Cr in normal range and within 360 days    Creatinine, Ser  Date Value Ref Range Status  03/28/2023 1.32 (H) 0.76 - 1.27 mg/dL Final   Creatinine, Urine  Date Value Ref Range Status  03/05/2019 82 mg/dL Final    Comment:    Performed at [facility], [address] [place], [place] [address]         Failed - Valid encounter within last 12 months    Recent Outpatient Visits   None            Passed - CBC within normal limits and completed in the last 12 months    WBC  Date Value Ref Range Status  11/28/2022 8.4 3.4 - 10.8 x10E3/uL Final  03/10/2019 6.4 4.0 - 10.5 K/uL Final   RBC  Date Value Ref Range Status  11/28/2022 4.54 4.14 - 5.80 x10E6/uL Final  03/10/2019 4.14 (L) 4.22 - 5.81 MIL/uL Final   Hemoglobin  Date Value Ref Range Status  11/28/2022 13.1 13.0 - 17.7 g/dL Final   Hematocrit  Date Value Ref Range Status  11/28/2022 39.5 37.5 - 51.0 % Final   MCHC  Date Value Ref Range Status  11/28/2022 33.2 31.5 - 35.7 g/dL Final  46/96/2952 84.1 30.0 - 36.0 g/dL Final   [facility]  Date Value Ref Range Status  11/28/2022 28.9 26.6 - 33.0 pg Final  03/10/2019 31.4 26.0 - 34.0 pg Final   MCV  Date Value Ref Range Status  11/28/2022 87  79 - 97 fL Final   No results found for: "PLTCOUNTKUC", "LABPLAT", "POCPLA" RDW  Date Value Ref Range Status  11/28/2022 14.5 11.6 - 15.4 % Final         Signed Prescriptions Disp Refills   metFORMIN (GLUCOPHAGE) 500 MG tablet 360 tablet 0    Sig: TAKE 2 TABLETS TWICE DAILY WITH MEALS     Endocrinology:  Diabetes - Biguanides Failed - 07/20/2023  2:10 PM      Failed - Cr in normal range and within 360 days    Creatinine, Ser  Date Value Ref Range Status  03/28/2023 1.32 (H) 0.76 - 1.27 mg/dL Final   Creatinine, Urine  Date Value Ref Range Status  03/05/2019 82 mg/dL Final    Comment:    Performed at [facility], [address] [place] [address] [place], [place] [address]         Failed - eGFR in normal range and within 360 days    GFR calc Af Denyse Dago  Date Value Ref  Range Status  01/15/2020 89 >59 mL/min/1.73 Final    Comment:    **[facility] currently reports eGFR in compliance with the current**   recommendations of the [employer]. [facility] will   update reporting as new guidelines are published from the NKF-ASN   Task force.    GFR calc non Af Amer  Date Value Ref Range Status  01/15/2020 77 >59 mL/min/1.73 Final   eGFR  Date Value Ref Range Status  03/28/2023 55 (L) >59 mL/min/1.73 Final         Failed - B12 Level in normal range and within 720 days    Vitamin B-12  Date Value Ref Range Status  09/26/2022 1,421 (H) 232 - 1,245 pg/mL Final         Failed - Valid encounter within last 6 months    Recent Outpatient Visits   None            Passed - HBA1C is between 0 and 7.9 and within 180 days    HB A1C (BAYER DCA - WAIVED)  Date Value Ref Range Status  09/26/2022 6.9 (H) 4.8 - 5.6 % Final    Comment:             Prediabetes: 5.7 - 6.4          Diabetes: >6.4          Glycemic control for adults with diabetes: <7.0    Hgb A1c MFr Bld  Date Value Ref Range Status  03/28/2023 7.7 (H) 4.8 - 5.6 % Final    Comment:             Prediabetes:  5.7 - 6.4          Diabetes: >6.4          Glycemic control for adults with diabetes: <7.0          Passed - CBC within normal limits and completed in the last 12 months    WBC  Date Value Ref Range Status  11/28/2022 8.4 3.4 - 10.8 x10E3/uL Final  03/10/2019 6.4 4.0 - 10.5 K/uL Final   RBC  Date Value Ref Range Status  11/28/2022 4.54 4.14 - 5.80 x10E6/uL Final  03/10/2019 4.14 (L) 4.22 - 5.81 MIL/uL Final   Hemoglobin  Date Value Ref Range Status  11/28/2022 13.1 13.0 - 17.7 g/dL Final   Hematocrit  Date Value Ref Range Status  11/28/2022 39.5 37.5 - 51.0 % Final   MCHC  Date Value Ref Range Status  11/28/2022 33.2 31.5 - 35.7 g/dL Final  25/95/6387 56.4 30.0 - 36.0 g/dL Final   [facility]  Date Value Ref Range Status  11/28/2022 28.9 26.6 - 33.0 pg Final  03/10/2019 31.4 26.0 - 34.0 pg Final   MCV  Date Value Ref Range Status  11/28/2022 87 79 - 97 fL Final   No results found for: "PLTCOUNTKUC", "LABPLAT", "POCPLA" RDW  Date Value Ref Range Status  11/28/2022 14.5 11.6 - 15.4 % Final          losartan (COZAAR) 100 MG tablet 90 tablet 0    Sig: TAKE 1 TABLET EVERY DAY     Cardiovascular:  Angiotensin Receptor Blockers Failed - 07/20/2023  2:10 PM      Failed - Cr in normal range and within 180 days    Creatinine, Ser  Date Value Ref Range Status  03/28/2023 1.32 (H) 0.76 - 1.27 mg/dL Final   Creatinine, Urine  Date Value Ref Range Status  03/05/2019 82 mg/dL Final    Comment:    Performed at [facility], [address] [place], [place] [address]         Failed - Valid encounter within last 6 months    Recent Outpatient Visits   None            Passed - K in normal range and within 180 days    Potassium  Date Value Ref Range Status  03/28/2023 4.3 3.5 - 5.2 mmol/L Final         Passed - Patient is not pregnant      Passed - Last BP in normal range    BP Readings from Last 1 Encounters:  03/28/23 138/88

## 2023-08-03 ENCOUNTER — Ambulatory Visit: Payer: Self-pay

## 2023-08-03 NOTE — Telephone Encounter (Signed)
 Chief Complaint: Nausea Symptoms: "glassy eyes" Frequency: x 1 week Pertinent Negatives: Patient denies fever, vomiting Disposition: [] ED /[x] Urgent Care (no appt availability in office) / [] Appointment(In office/virtual)/ []  [facility]/ [] Home Care/ [] Refused Recommended Disposition /[] [facility]/ []  Follow-up with PCP Additional Notes: Pt's wife reports pt has had "glassy" look to eyes all week, notes pt began to decline today with c/o weakness, fatigue and nausea. No OV available, UC advised, pt's spouse agreeable. This RN educated pt on home care, new-worsening symptoms, when to call back/seek emergent care. Pt verbalized understanding and agrees to plan.     Copied from CRM [id number]. Topic: Clinical - Red Word Triage >> Aug 03, 2023  4:53 PM Orien Bird wrote: [facility] that prompted transfer to Nurse Triage: Patient eyes is real glasses and he's been feeling very nausea and wife stated he wasn't able to make it in the house today. Reason for Disposition  Patient sounds very sick or weak to the triager  Answer Assessment - Initial Assessment Questions 1. NAUSEA SEVERITY: "How bad is the nausea?" (e.g., mild, moderate, severe; dehydration, weight loss)   - MILD: loss of appetite without change in eating habits   - MODERATE: decreased oral intake without significant weight loss, dehydration, or malnutrition   - SEVERE: inadequate caloric or fluid intake, significant weight loss, symptoms of dehydration     Mild 2. ONSET: "When did the nausea begin?"     X 1 week 3. VOMITING: "Any vomiting?" If Yes, ask: "How many times today?"     None  5. CAUSE: "What do you think is causing the nausea?"     Unknown  Protocols used: Nausea-A-AH

## 2023-08-06 NOTE — Telephone Encounter (Signed)
 Called and spoke with wife to schedule appointment. She says he is feeling better and no appointment needed at this time.

## 2023-09-06 ENCOUNTER — Encounter: Payer: Self-pay | Admitting: Nurse Practitioner

## 2023-09-06 ENCOUNTER — Ambulatory Visit: Admitting: Nurse Practitioner

## 2023-09-06 VITALS — BP 116/74 | HR 68 | Temp 97.9°F | Resp 18 | Wt 163.6 lb

## 2023-09-06 DIAGNOSIS — Z7984 Long term (current) use of oral hypoglycemic drugs: Secondary | ICD-10-CM

## 2023-09-06 DIAGNOSIS — E1169 Type 2 diabetes mellitus with other specified complication: Secondary | ICD-10-CM | POA: Diagnosis not present

## 2023-09-06 DIAGNOSIS — E1159 Type 2 diabetes mellitus with other circulatory complications: Secondary | ICD-10-CM | POA: Diagnosis not present

## 2023-09-06 DIAGNOSIS — I152 Hypertension secondary to endocrine disorders: Secondary | ICD-10-CM | POA: Diagnosis not present

## 2023-09-06 DIAGNOSIS — I700 Atherosclerosis of aorta: Secondary | ICD-10-CM

## 2023-09-06 DIAGNOSIS — E785 Hyperlipidemia, unspecified: Secondary | ICD-10-CM

## 2023-09-06 MED ORDER — EMPAGLIFLOZIN 10 MG PO TABS: 10.0000 mg | ORAL_TABLET | Freq: Every day | ORAL | 1 refills | Status: AC

## 2023-09-06 MED ORDER — PANTOPRAZOLE SODIUM 40 MG PO TBEC: 40.0000 mg | DELAYED_RELEASE_TABLET | Freq: Every day | ORAL | 1 refills | Status: DC

## 2023-09-06 NOTE — Progress Notes (Signed)
 BP 116/74 (BP Location: Right Arm, Patient Position: Sitting, Cuff Size: Normal)   Pulse 68   Temp 97.9 F (36.6 C) (Oral)   Resp 18   Wt 163 lb 9.6 oz (74.2 kg)   SpO2 97%   BMI 27.22 kg/m    Subjective:    Patient ID: Carl Bryan, male    DOB: 29-Jul-1945, 78 y.o.   MRN: [id number]  HPI: Carl Bryan is a 78 y.o. male  Chief Complaint  Patient presents with   Follow-up    Says he feels drowsy when he eats and needs to lay down for awhile before he feels better.    Diabetes    98-100 fasting am glucose that he checks daily every morning.     HYPERTENSION / HYPERLIPIDEMIA Satisfied with current treatment? yes Duration of hypertension: years BP monitoring frequency: daily BP range: doesn't remember BP medication side effects: no Past BP meds: amlodipine  and losartan  (cozaar ) Duration of hyperlipidemia: years Cholesterol medication side effects: no Cholesterol supplements: none Past cholesterol medications: rosuvastatin  (crestor ) Medication compliance: excellent compliance Aspirin: no Recent stressors: no Recurrent headaches: no Visual changes: no Palpitations: no Dyspnea: no Chest pain: no Lower extremity edema: no Dizzy/lightheaded: no  DIABETES Denies concerns during visit today.  Takes metformin  and glipizide , Jardiance . has had issues with obtaining a prescription refill.  Hypoglycemic episodes:no Polydipsia/polyuria: no Visual disturbance: no Chest pain: no Paresthesias: no Glucose Monitoring: yes  Accucheck frequency: Daily;   Fasting glucose: 98-100  Post prandial:  Evening:  Before meals: Taking Insulin ?: no  Long acting insulin :  Short acting insulin : Blood Pressure Monitoring: daily Retinal Examination: Done today Foot Exam: up to date Diabetic Education: Completed today Pneumovax: Up to Date Influenza: Completed today Aspirin: no  Relevant past medical, surgical, family and social history reviewed and updated as indicated. Interim medical  history since our last visit reviewed. Allergies and medications reviewed and updated.  Review of Systems  Constitutional:  Negative for activity change, appetite change, chills, fever and unexpected weight change.  Eyes:  Negative for visual disturbance.  Respiratory:  Negative for cough, chest tightness and shortness of breath.   Cardiovascular:  Negative for chest pain, palpitations and leg swelling.  Gastrointestinal:  Negative for diarrhea.  Endocrine: Negative for polydipsia and polyuria.  Musculoskeletal:  Negative for joint swelling.  Neurological:  Negative for dizziness, light-headedness, numbness and headaches.  Psychiatric/Behavioral:  Negative for sleep disturbance and suicidal ideas. The patient is not nervous/anxious.     Per HPI unless specifically indicated above     Objective:    BP 116/74 (BP Location: Right Arm, Patient Position: Sitting, Cuff Size: Normal)   Pulse 68   Temp 97.9 F (36.6 C) (Oral)   Resp 18   Wt 163 lb 9.6 oz (74.2 kg)   SpO2 97%   BMI 27.22 kg/m   Wt Readings from Last 3 Encounters:  09/06/23 163 lb 9.6 oz (74.2 kg)  03/28/23 164 lb (74.4 kg)  12/27/22 167 lb 9.6 oz (76 kg)    Physical Exam Vitals and nursing note reviewed.  Constitutional:      General: He is not in acute distress.    Appearance: Normal appearance. He is not ill-appearing, toxic-appearing or diaphoretic.  HENT:     Nose: No congestion or rhinorrhea.  Eyes:     General:        Right eye: No discharge.        Left eye: No discharge.  Pupils: Pupils are equal, round, and reactive to light.  Cardiovascular:     Rate and Rhythm: Normal rate. Rhythm irregular.     Pulses:          Dorsalis pedis pulses are 1+ on the left side.       Posterior tibial pulses are 1+ on the left side.     Heart sounds: No murmur heard. Pulmonary:     Effort: Pulmonary effort is normal. No respiratory distress.     Breath sounds: Normal breath sounds. No wheezing, rhonchi or rales.   Musculoskeletal:     Cervical back: Normal range of motion and neck supple.     Comments: Uses crutches due to R AKA.     Right Lower Extremity: Right leg is amputated above knee.  Feet:     Left foot:     Protective Sensation: 10 sites tested.  10 sites sensed.     Skin integrity: Dry skin present. No skin breakdown or warmth.     Toenail Condition: Left toenails are abnormally thick. Fungal disease present. Skin:    General: Skin is warm and dry.     Capillary Refill: Capillary refill takes less than 2 seconds.  Neurological:     General: No focal deficit present.     Mental Status: He is alert and oriented to person, place, and time.  Psychiatric:        Mood and Affect: Mood normal.        Behavior: Behavior normal.        Thought Content: Thought content normal.        Judgment: Judgment normal.     Results for orders placed or performed in visit on 03/28/23  Microalbumin, Urine Waived   Collection Time: 03/28/23 11:12 AM  Result Value Ref Range   Microalb, Ur Waived 150 (H) 0 - 19 mg/L   Creatinine, Urine Waived 50 10 - 300 mg/dL   Microalb/Creat Ratio >300 (H) <30 mg/g  Comp Met (CMET)   Collection Time: 03/28/23 11:17 AM  Result Value Ref Range   Glucose 108 (H) 70 - 99 mg/dL   BUN 12 8 - 27 mg/dL   Creatinine, Ser 1.61 (H) 0.76 - 1.27 mg/dL   eGFR 55 (L) >09 UE/AVW/0.98   BUN/Creatinine Ratio 9 (L) 10 - 24   Sodium 137 134 - 144 mmol/L   Potassium 4.3 3.5 - 5.2 mmol/L   Chloride 100 96 - 106 mmol/L   CO2 22 20 - 29 mmol/L   Calcium  10.2 8.6 - 10.2 mg/dL   Total Protein 7.0 6.0 - 8.5 g/dL   Albumin 4.5 3.8 - 4.8 g/dL   Globulin, Total 2.5 1.5 - 4.5 g/dL   Bilirubin Total 0.6 0.0 - 1.2 mg/dL   Alkaline Phosphatase 61 44 - 121 IU/L   AST 11 0 - 40 IU/L   ALT 15 0 - 44 IU/L  Lipid Profile   Collection Time: 03/28/23 11:17 AM  Result Value Ref Range   Cholesterol, Total 130 100 - 199 mg/dL   Triglycerides 119 (H) 0 - 149 mg/dL   HDL 33 (L) >14 mg/dL    VLDL Cholesterol Cal 29 5 - 40 mg/dL   LDL Chol Calc (NIH) 68 0 - 99 mg/dL   Chol/HDL Ratio 3.9 0.0 - 5.0 ratio  HgB A1c   Collection Time: 03/28/23 11:17 AM  Result Value Ref Range   Hgb A1c MFr Bld 7.7 (H) 4.8 - 5.6 %   Est.  average glucose Bld gHb Est-mCnc 174 mg/dL  Vitamin D  (25 hydroxy)   Collection Time: 03/28/23 11:17 AM  Result Value Ref Range   Vit D, 25-Hydroxy 30.3 30.0 - 100.0 ng/mL      Assessment & Plan:   Problem List Items Addressed This Visit       Cardiovascular and Mediastinum   Hypertension associated with diabetes (HCC) - Primary   Chronic, controlled. Appears controlled on current regimen comprised of Losartan  100 mg PO every day, Amlodipine  10 mg PO every day and appears to be tolerating well Does not need refills today. Continue current regimen Recommend checking BP daily and recording for monitoring at home  Follow up in 3 months or sooner if concerns arise       Relevant Medications   empagliflozin  (JARDIANCE ) 10 MG TABS tablet   Other Relevant Orders   Hemoglobin A1c   Comprehensive metabolic panel with GFR   Aortic atherosclerosis (HCC)   Chronic.  Controlled.  Continue current regimen of Crestor  40mg . Continue to monitor. Labs drawn today.  Follow up in 3 months.  Call sooner if concerns arise.        Endocrine   Hyperlipidemia associated with type 2 diabetes mellitus (HCC)   Chronic, controlled. Appears well managed on current regimen comprised of Rosuvastatin  40 mg PO every day  Continue current regimen Labs ordered at visit today. Follow up in 3 months or sooner if concerns arise       Relevant Medications   empagliflozin  (JARDIANCE ) 10 MG TABS tablet   Other Relevant Orders   Lipid panel   Diabetes mellitus associated with hormonal etiology (HCC)   Chronic.  Last A1c was 7.7%.  Will check labs at visit today. Still taking Metformin  1000mg  BID and Jardiance .  Can consider adding GLP1 if not well controlled.  Lengthy conversation  had regarding patient's diet during the visit.  Continue with current medication regimen.  Labs ordered today.  Return to clinic in 3 months for reevaluation.  Call sooner if concerns arise.       Relevant Medications   empagliflozin  (JARDIANCE ) 10 MG TABS tablet     Follow up plan: Return in about 3 months (around 12/07/2023) for HTN, HLD, DM2 FU.

## 2023-09-06 NOTE — Assessment & Plan Note (Signed)
 Chronic.  Last A1c was 7.7%.  Will check labs at visit today. Still taking Metformin  1000mg  BID and Jardiance .  Can consider adding GLP1 if not well controlled.  Lengthy conversation had regarding patient's diet during the visit.  Continue with current medication regimen.  Labs ordered today.  Return to clinic in 3 months for reevaluation.  Call sooner if concerns arise.

## 2023-09-06 NOTE — Assessment & Plan Note (Signed)
 Chronic, controlled. Appears controlled on current regimen comprised of Losartan  100 mg PO every day, Amlodipine  10 mg PO every day and appears to be tolerating well Does not need refills today. Continue current regimen Recommend checking BP daily and recording for monitoring at home  Follow up in 3 months or sooner if concerns arise

## 2023-09-06 NOTE — Assessment & Plan Note (Signed)
 Chronic, controlled. Appears well managed on current regimen comprised of Rosuvastatin  40 mg PO every day  Continue current regimen Labs ordered at visit today. Follow up in 3 months or sooner if concerns arise

## 2023-09-06 NOTE — Assessment & Plan Note (Signed)
 Chronic.  Controlled.  Continue current regimen of Crestor 40mg . Continue to monitor. Labs drawn today.  Follow up in 3 months.  Call sooner if concerns arise.

## 2023-09-07 ENCOUNTER — Ambulatory Visit: Payer: Self-pay | Admitting: Nurse Practitioner

## 2023-09-07 LAB — LIPID PANEL
Chol/HDL Ratio: 3.4 ratio (ref 0.0–5.0)
Cholesterol, Total: 118 mg/dL (ref 100–199)
HDL: 35 mg/dL — ABNORMAL LOW (ref >39)
LDL Chol Calc (NIH): 58 mg/dL (ref 0–99)
Triglycerides: 145 mg/dL (ref 0–149)
VLDL Cholesterol Cal: 25 mg/dL (ref 5–40)

## 2023-09-07 LAB — HEMOGLOBIN A1C
Est. average glucose Bld gHb Est-mCnc: 140 mg/dL
Hgb A1c MFr Bld: 6.5 % — ABNORMAL HIGH (ref 4.8–5.6)

## 2023-09-07 LAB — COMPREHENSIVE METABOLIC PANEL WITH GFR
ALT: 17 IU/L (ref 0–44)
AST: 14 IU/L (ref 0–40)
Albumin: 4.5 g/dL (ref 3.8–4.8)
Alkaline Phosphatase: 64 IU/L (ref 44–121)
BUN/Creatinine Ratio: 13 (ref 10–24)
BUN: 20 mg/dL (ref 8–27)
Bilirubin Total: 0.7 mg/dL (ref 0.0–1.2)
CO2: 20 mmol/L (ref 20–29)
Calcium: 10.3 mg/dL — ABNORMAL HIGH (ref 8.6–10.2)
Chloride: 100 mmol/L (ref 96–106)
Creatinine, Ser: 1.51 mg/dL — ABNORMAL HIGH (ref 0.76–1.27)
Globulin, Total: 2.2 g/dL (ref 1.5–4.5)
Glucose: 97 mg/dL (ref 70–99)
Potassium: 4.4 mmol/L (ref 3.5–5.2)
Sodium: 135 mmol/L (ref 134–144)
Total Protein: 6.7 g/dL (ref 6.0–8.5)
eGFR: 47 mL/min/{1.73_m2} — ABNORMAL LOW (ref >59)

## 2023-10-03 ENCOUNTER — Other Ambulatory Visit: Payer: Self-pay | Admitting: Nurse Practitioner

## 2023-10-03 DIAGNOSIS — E1169 Type 2 diabetes mellitus with other specified complication: Secondary | ICD-10-CM

## 2023-10-04 NOTE — Telephone Encounter (Signed)
 Requested Prescriptions  Pending Prescriptions Disp Refills   glipiZIDE  (GLUCOTROL ) 5 MG tablet [Pharmacy Med Name: glipiZIDE  Oral Tablet 5 MG] 90 tablet 0    Sig: TAKE 1 TABLET EVERY DAY BEFORE BREAKFAST     Endocrinology:  Diabetes - Sulfonylureas Failed - 10/04/2023  2:10 PM      Failed - Cr in normal range and within 360 days    Creatinine, Ser  Date Value Ref Range Status  09/06/2023 1.51 (H) 0.76 - 1.27 mg/dL Final   Creatinine, Urine  Date Value Ref Range Status  03/05/2019 82 mg/dL Final    Comment:    Performed at [facility], [address] [place], [place] [address]         Failed - Valid encounter within last 6 months    Recent Outpatient Visits           4 weeks ago Hypertension associated with diabetes [facility])   [place] [facility] Melvin Pao, NP              Passed - HBA1C is between 0 and 7.9 and within 180 days    HB A1C (BAYER DCA - WAIVED)  Date Value Ref Range Status  09/26/2022 6.9 (H) 4.8 - 5.6 % Final    Comment:             Prediabetes: 5.7 - 6.4          Diabetes: >6.4          Glycemic control for adults with diabetes: <7.0    Hgb A1c MFr Bld  Date Value Ref Range Status  09/06/2023 6.5 (H) 4.8 - 5.6 % Final    Comment:             Prediabetes: 5.7 - 6.4          Diabetes: >6.4          Glycemic control for adults with diabetes: <7.0           amLODipine  (NORVASC ) 10 MG tablet [Pharmacy Med Name: amLODIPine  Besylate Oral Tablet 10 MG] 90 tablet 0    Sig: TAKE 1 TABLET EVERY DAY     Cardiovascular: Calcium  Channel Blockers 2 Failed - 10/04/2023  2:10 PM      Failed - Valid encounter within last 6 months    Recent Outpatient Visits           4 weeks ago Hypertension associated with diabetes [facility])   [place] Melvin Pao, NP              Passed - Last BP in normal range    BP Readings from Last 1 Encounters:  09/06/23 116/74         Passed - Last Heart Rate  in normal range    Pulse Readings from Last 1 Encounters:  09/06/23 68          metFORMIN  (GLUCOPHAGE ) 500 MG tablet [Pharmacy Med Name: metFORMIN  HCl Oral Tablet 500 MG] 360 tablet 0    Sig: TAKE 2 TABLETS TWICE DAILY WITH MEALS     Endocrinology:  Diabetes - Biguanides Failed - 10/04/2023  2:10 PM      Failed - Cr in normal range and within 360 days    Creatinine, Ser  Date Value Ref Range Status  09/06/2023 1.51 (H) 0.76 - 1.27 mg/dL Final   Creatinine, Urine  Date Value Ref Range Status  03/05/2019 82 mg/dL Final    Comment:  Performed at [facility], [address] [place], [place] [address]         Failed - eGFR in normal range and within 360 days    GFR calc Af Amer  Date Value Ref Range Status  01/15/2020 89 >59 mL/min/1.73 Final    Comment:    **[facility] currently reports eGFR in compliance with the current**   recommendations of the [employer]. [facility] will   update reporting as new guidelines are published from the NKF-ASN   Task force.    GFR calc non Af Amer  Date Value Ref Range Status  01/15/2020 77 >59 mL/min/1.73 Final   eGFR  Date Value Ref Range Status  09/06/2023 47 (L) >59 mL/min/1.73 Final         Failed - B12 Level in normal range and within 720 days    Vitamin B-12  Date Value Ref Range Status  09/26/2022 1,421 (H) 232 - 1,245 pg/mL Final         Failed - Valid encounter within last 6 months    Recent Outpatient Visits           4 weeks ago Hypertension associated with diabetes (HCC)   [facility], Darice, NP              Passed - HBA1C is between 0 and 7.9 and within 180 days    HB A1C (BAYER DCA - WAIVED)  Date Value Ref Range Status  09/26/2022 6.9 (H) 4.8 - 5.6 % Final    Comment:             Prediabetes: 5.7 - 6.4          Diabetes: >6.4          Glycemic control for adults with diabetes: <7.0    Hgb A1c MFr Bld  Date Value Ref Range Status  09/06/2023  6.5 (H) 4.8 - 5.6 % Final    Comment:             Prediabetes: 5.7 - 6.4          Diabetes: >6.4          Glycemic control for adults with diabetes: <7.0          Passed - CBC within normal limits and completed in the last 12 months    WBC  Date Value Ref Range Status  11/28/2022 8.4 3.4 - 10.8 x10E3/uL Final  03/10/2019 6.4 4.0 - 10.5 K/uL Final   RBC  Date Value Ref Range Status  11/28/2022 4.54 4.14 - 5.80 x10E6/uL Final  03/10/2019 4.14 (L) 4.22 - 5.81 MIL/uL Final   Hemoglobin  Date Value Ref Range Status  11/28/2022 13.1 13.0 - 17.7 g/dL Final   Hematocrit  Date Value Ref Range Status  11/28/2022 39.5 37.5 - 51.0 % Final   MCHC  Date Value Ref Range Status  11/28/2022 33.2 31.5 - 35.7 g/dL Final  88/69/7979 65.3 30.0 - 36.0 g/dL Final   [facility]  Date Value Ref Range Status  11/28/2022 28.9 26.6 - 33.0 pg Final  03/10/2019 31.4 26.0 - 34.0 pg Final   MCV  Date Value Ref Range Status  11/28/2022 87 79 - 97 fL Final   No results found for: PLTCOUNTKUC, LABPLAT, POCPLA RDW  Date Value Ref Range Status  11/28/2022 14.5 11.6 - 15.4 % Final          losartan  (COZAAR ) 100 MG tablet [Pharmacy Med Name: Losartan  Potassium  Oral Tablet 100 MG] 90 tablet 0    Sig: TAKE 1 TABLET EVERY DAY     Cardiovascular:  Angiotensin Receptor Blockers Failed - 10/04/2023  2:10 PM      Failed - Cr in normal range and within 180 days    Creatinine, Ser  Date Value Ref Range Status  09/06/2023 1.51 (H) 0.76 - 1.27 mg/dL Final   Creatinine, Urine  Date Value Ref Range Status  03/05/2019 82 mg/dL Final    Comment:    Performed at [facility], [address] [place], [place] [address]         Failed - Valid encounter within last 6 months    Recent Outpatient Visits           4 weeks ago Hypertension associated with diabetes [facility])   [facility] [facility] Melvin Pao, NP              Passed - K in normal range and within 180 days     Potassium  Date Value Ref Range Status  09/06/2023 4.4 3.5 - 5.2 mmol/L Final         Passed - Patient is not pregnant      Passed - Last BP in normal range    BP Readings from Last 1 Encounters:  09/06/23 116/74          rosuvastatin  (CRESTOR ) 40 MG tablet [Pharmacy Med Name: Rosuvastatin  Calcium  Oral Tablet 40 MG] 90 tablet 0    Sig: TAKE 1 TABLET EVERY DAY     Cardiovascular:  Antilipid - Statins 2 Failed - 10/04/2023  2:10 PM      Failed - Cr in normal range and within 360 days    Creatinine, Ser  Date Value Ref Range Status  09/06/2023 1.51 (H) 0.76 - 1.27 mg/dL Final   Creatinine, Urine  Date Value Ref Range Status  03/05/2019 82 mg/dL Final    Comment:    Performed at [facility], [address] [place], [place] [address]         Failed - Valid encounter within last 12 months    Recent Outpatient Visits           4 weeks ago Hypertension associated with diabetes [facility])   [place] [facility] Melvin Pao, NP              Failed - Lipid Panel in normal range within the last 12 months    Cholesterol, Total  Date Value Ref Range Status  09/06/2023 118 100 - 199 mg/dL Final   Cholesterol Piccolo, Waived  Date Value Ref Range Status  03/28/2018 122 <200 mg/dL Final    Comment:                            Desirable                <200                         Borderline High      200- 239                         High                     >239    LDL Chol Calc (NIH)  Date Value Ref Range  Status  09/06/2023 58 0 - 99 mg/dL Final   HDL  Date Value Ref Range Status  09/06/2023 35 (L) >39 mg/dL Final   Triglycerides  Date Value Ref Range Status  09/06/2023 145 0 - 149 mg/dL Final   Triglycerides Piccolo,Waived  Date Value Ref Range Status  03/28/2018 232 (H) <150 mg/dL Final    Comment:                            Normal                   <150                         Borderline High     150 - 199                          High                200 - 499                         Very High                >499          Passed - Patient is not pregnant       tamsulosin  (FLOMAX ) 0.4 MG CAPS capsule [Pharmacy Med Name: Tamsulosin  HCl Oral Capsule 0.4 MG] 90 capsule 0    Sig: TAKE 1 CAPSULE EVERY DAY     Urology: Alpha-Adrenergic Blocker Failed - 10/04/2023  2:10 PM      Failed - PSA in normal range and within 360 days    Prostate Specific Ag, Serum  Date Value Ref Range Status  07/28/2020 2.4 0.0 - 4.0 ng/mL Final    Comment:    Roche ECLIA methodology. According to the American Urological Association, Serum PSA should decrease and remain at undetectable levels after radical prostatectomy. The AUA defines biochemical recurrence as an initial PSA value 0.2 ng/mL or greater followed by a subsequent confirmatory PSA value 0.2 ng/mL or greater. Values obtained with different assay methods or kits cannot be used interchangeably. Results cannot be interpreted as absolute evidence of the presence or absence of malignant disease.          Failed - Valid encounter within last 12 months    Recent Outpatient Visits           4 weeks ago Hypertension associated with diabetes [facility])   [facility] Melvin Pao, NP              Passed - Last BP in normal range    BP Readings from Last 1 Encounters:  09/06/23 116/74

## 2023-10-16 ENCOUNTER — Other Ambulatory Visit: Payer: Self-pay | Admitting: Nurse Practitioner

## 2023-10-18 NOTE — Telephone Encounter (Signed)
 Requested Prescriptions  Pending Prescriptions Disp Refills   allopurinol  (ZYLOPRIM ) 300 MG tablet [Pharmacy Med Name: Allopurinol  Oral Tablet 300 MG] 90 tablet 0    Sig: TAKE 1 TABLET EVERY DAY     Endocrinology:  Gout Agents - allopurinol  Failed - 10/18/2023  2:31 PM      Failed - Uric Acid in normal range and within 360 days    Uric Acid  Date Value Ref Range Status  07/28/2020 2.4 (L) 3.8 - 8.4 mg/dL Final    Comment:               Therapeutic target for gout patients: <6.0         Failed - Cr in normal range and within 360 days    Creatinine, Ser  Date Value Ref Range Status  09/06/2023 1.51 (H) 0.76 - 1.27 mg/dL Final   Creatinine, Urine  Date Value Ref Range Status  03/05/2019 82 mg/dL Final    Comment:    Performed at [facility], [address], [place], [place] [address]         Passed - Valid encounter within last 12 months    Recent Outpatient Visits           1 month ago Hypertension associated with diabetes [facility])   [facility] Melvin Pao, NP              Passed - CBC within normal limits and completed in the last 12 months    WBC  Date Value Ref Range Status  11/28/2022 8.4 3.4 - 10.8 x10E3/uL Final  03/10/2019 6.4 4.0 - 10.5 K/uL Final   RBC  Date Value Ref Range Status  11/28/2022 4.54 4.14 - 5.80 x10E6/uL Final  03/10/2019 4.14 (L) 4.22 - 5.81 MIL/uL Final   Hemoglobin  Date Value Ref Range Status  11/28/2022 13.1 13.0 - 17.7 g/dL Final   Hematocrit  Date Value Ref Range Status  11/28/2022 39.5 37.5 - 51.0 % Final   MCHC  Date Value Ref Range Status  11/28/2022 33.2 31.5 - 35.7 g/dL Final  88/69/7979 65.3 30.0 - 36.0 g/dL Final   [facility]  Date Value Ref Range Status  11/28/2022 28.9 26.6 - 33.0 pg Final  03/10/2019 31.4 26.0 - 34.0 pg Final   MCV  Date Value Ref Range Status  11/28/2022 87 79 - 97 fL Final   No results found for: PLTCOUNTKUC, LABPLAT, POCPLA RDW  Date Value Ref Range  Status  11/28/2022 14.5 11.6 - 15.4 % Final

## 2023-12-03 ENCOUNTER — Other Ambulatory Visit: Payer: Self-pay | Admitting: Nurse Practitioner

## 2023-12-03 DIAGNOSIS — E1169 Type 2 diabetes mellitus with other specified complication: Secondary | ICD-10-CM

## 2023-12-04 NOTE — Telephone Encounter (Signed)
 Requested Prescriptions  Pending Prescriptions Disp Refills   glucose blood (TRUE METRIX BLOOD GLUCOSE TEST) test strip [Pharmacy Med Name: TRUE METRIX SELF MONITORI] 200 strip 3    Sig: TEST BLOOD SUGAR TWICE DAILY AS DIRECTED     Endocrinology: Diabetes - Testing Supplies Passed - 12/04/2023  1:51 PM      Passed - Valid encounter within last 12 months    Recent Outpatient Visits           2 months ago Hypertension associated with diabetes [facility])   [facility] Melvin Pao, NP

## 2023-12-07 ENCOUNTER — Encounter: Payer: Self-pay | Admitting: Nurse Practitioner

## 2023-12-07 ENCOUNTER — Ambulatory Visit (INDEPENDENT_AMBULATORY_CARE_PROVIDER_SITE_OTHER): Admitting: Nurse Practitioner

## 2023-12-07 VITALS — BP 107/58 | HR 60 | Temp 97.8°F | Wt 157.0 lb

## 2023-12-07 DIAGNOSIS — I700 Atherosclerosis of aorta: Secondary | ICD-10-CM | POA: Diagnosis not present

## 2023-12-07 DIAGNOSIS — I152 Hypertension secondary to endocrine disorders: Secondary | ICD-10-CM | POA: Diagnosis not present

## 2023-12-07 DIAGNOSIS — E1169 Type 2 diabetes mellitus with other specified complication: Secondary | ICD-10-CM

## 2023-12-07 DIAGNOSIS — E1159 Type 2 diabetes mellitus with other circulatory complications: Secondary | ICD-10-CM | POA: Diagnosis not present

## 2023-12-07 DIAGNOSIS — E785 Hyperlipidemia, unspecified: Secondary | ICD-10-CM | POA: Diagnosis not present

## 2023-12-07 NOTE — Assessment & Plan Note (Signed)
 Chronic.  Controlled.  Continue current regimen of Crestor 40mg . Continue to monitor. Labs drawn today.  Follow up in 3 months.  Call sooner if concerns arise.

## 2023-12-07 NOTE — Assessment & Plan Note (Signed)
 Chronic, controlled. Appears well managed on current regimen comprised of Rosuvastatin  40 mg PO every day  Continue current regimen Labs ordered at visit today. Follow up in 3 months or sooner if concerns arise

## 2023-12-07 NOTE — Assessment & Plan Note (Signed)
 Chronic.  Last A1c was 6.5%.  Will check labs at visit today. Still taking Metformin  1000mg  BID and Jardiance .  Can consider adding GLP1 if not well controlled.  Patient has lost 6lbs since last visit.  Suspect his diet isn't great with his wife being on hospice.  Referral placed for [facility] to see if he can get help with meals.   Continue with current medication regimen.  Labs ordered today.  Return to clinic in 3 months for reevaluation.  Call sooner if concerns arise.

## 2023-12-07 NOTE — Assessment & Plan Note (Signed)
 Chronic, controlled. Appears controlled on current regimen comprised of Losartan  100 mg PO every day, Amlodipine  10 mg PO every day and appears to be tolerating well Does not need refills today. Continue current regimen Recommend checking BP daily and recording for monitoring at home  Follow up in 3 months or sooner if concerns arise

## 2023-12-07 NOTE — Progress Notes (Signed)
 BP (!) 107/58   Pulse 60   Temp 97.8 F (36.6 C) (Oral)   Wt 157 lb (71.2 kg)   SpO2 98%   BMI 26.13 kg/m    Subjective:    Patient ID: Carl Bryan, male    DOB: October 12, 1945, 78 y.o.   MRN: [id number]  HPI: Carl Bryan is a 78 y.o. male  Chief Complaint  Patient presents with   Hypertension   HYPERTENSION / HYPERLIPIDEMIA Satisfied with current treatment? yes Duration of hypertension: years BP monitoring frequency: daily BP range: doesn't remember BP medication side effects: no Past BP meds: amlodipine  and losartan  (cozaar ) Duration of hyperlipidemia: years Cholesterol medication side effects: no Cholesterol supplements: none Past cholesterol medications: rosuvastatin  (crestor ) Medication compliance: excellent compliance Aspirin: no Recent stressors: no Recurrent headaches: no Visual changes: no Palpitations: no Dyspnea: no Chest pain: no Lower extremity edema: no Dizzy/lightheaded: no  DIABETES Denies concerns during visit today.  Takes metformin  and glipizide , Jardiance .  Has lost 6lbs since last visit.  Hypoglycemic episodes:no Polydipsia/polyuria: no Visual disturbance: no Chest pain: no Paresthesias: no Glucose Monitoring: yes  Accucheck frequency: Daily;   Fasting glucose: 77-100  Post prandial:  Evening:  Before meals: Taking Insulin ?: no  Long acting insulin :  Short acting insulin : Blood Pressure Monitoring: daily Retinal Examination: Done today Foot Exam: up to date Diabetic Education: Completed today Pneumovax: Up to Date Influenza: Completed today Aspirin: no  Patient's wife is on hospice and due to pass away any day.   Relevant past medical, surgical, family and social history reviewed and updated as indicated. Interim medical history since our last visit reviewed. Allergies and medications reviewed and updated.  Review of Systems  Constitutional:  Negative for activity change, appetite change, chills, fever and unexpected weight  change.  Eyes:  Negative for visual disturbance.  Respiratory:  Negative for cough, chest tightness and shortness of breath.   Cardiovascular:  Negative for chest pain, palpitations and leg swelling.  Gastrointestinal:  Negative for diarrhea.  Endocrine: Negative for polydipsia and polyuria.  Musculoskeletal:  Negative for joint swelling.  Neurological:  Negative for dizziness, light-headedness, numbness and headaches.  Psychiatric/Behavioral:  Negative for sleep disturbance and suicidal ideas. The patient is not nervous/anxious.     Per HPI unless specifically indicated above     Objective:    BP (!) 107/58   Pulse 60   Temp 97.8 F (36.6 C) (Oral)   Wt 157 lb (71.2 kg)   SpO2 98%   BMI 26.13 kg/m   Wt Readings from Last 3 Encounters:  12/07/23 157 lb (71.2 kg)  09/06/23 163 lb 9.6 oz (74.2 kg)  03/28/23 164 lb (74.4 kg)    Physical Exam Vitals and nursing note reviewed.  Constitutional:      General: He is not in acute distress.    Appearance: Normal appearance. He is not ill-appearing, toxic-appearing or diaphoretic.  HENT:     Nose: No congestion or rhinorrhea.  Eyes:     General:        Right eye: No discharge.        Left eye: No discharge.     Pupils: Pupils are equal, round, and reactive to light.  Cardiovascular:     Rate and Rhythm: Normal rate. Rhythm irregular.     Pulses:          Dorsalis pedis pulses are 1+ on the left side.       Posterior tibial pulses are 1+ on the left  side.     Heart sounds: No murmur heard. Pulmonary:     Effort: Pulmonary effort is normal. No respiratory distress.     Breath sounds: Normal breath sounds. No wheezing, rhonchi or rales.  Musculoskeletal:     Cervical back: Normal range of motion and neck supple.     Comments: Uses crutches due to R AKA.     Right Lower Extremity: Right leg is amputated above knee.  Feet:     Left foot:     Protective Sensation: 10 sites tested.  10 sites sensed.     Skin integrity: Dry skin  present. No skin breakdown or warmth.     Toenail Condition: Left toenails are abnormally thick. Fungal disease present. Skin:    General: Skin is warm and dry.     Capillary Refill: Capillary refill takes less than 2 seconds.  Neurological:     General: No focal deficit present.     Mental Status: He is alert and oriented to person, place, and time.  Psychiatric:        Mood and Affect: Mood normal.        Behavior: Behavior normal.        Thought Content: Thought content normal.        Judgment: Judgment normal.     Results for orders placed or performed in visit on 09/06/23  Hemoglobin A1c   Collection Time: 09/06/23  2:40 PM  Result Value Ref Range   Hgb A1c MFr Bld 6.5 (H) 4.8 - 5.6 %   Est. average glucose Bld gHb Est-mCnc 140 mg/dL  Comprehensive metabolic panel with GFR   Collection Time: 09/06/23  2:40 PM  Result Value Ref Range   Glucose 97 70 - 99 mg/dL   BUN 20 8 - 27 mg/dL   Creatinine, Ser 8.48 (H) 0.76 - 1.27 mg/dL   eGFR 47 (L) >40 fO/fpw/8.26   BUN/Creatinine Ratio 13 10 - 24   Sodium 135 134 - 144 mmol/L   Potassium 4.4 3.5 - 5.2 mmol/L   Chloride 100 96 - 106 mmol/L   CO2 20 20 - 29 mmol/L   Calcium  10.3 (H) 8.6 - 10.2 mg/dL   Total Protein 6.7 6.0 - 8.5 g/dL   Albumin 4.5 3.8 - 4.8 g/dL   Globulin, Total 2.2 1.5 - 4.5 g/dL   Bilirubin Total 0.7 0.0 - 1.2 mg/dL   Alkaline Phosphatase 64 44 - 121 IU/L   AST 14 0 - 40 IU/L   ALT 17 0 - 44 IU/L  Lipid panel   Collection Time: 09/06/23  2:40 PM  Result Value Ref Range   Cholesterol, Total 118 100 - 199 mg/dL   Triglycerides 854 0 - 149 mg/dL   HDL 35 (L) >60 mg/dL   VLDL Cholesterol Cal 25 5 - 40 mg/dL   LDL Chol Calc (NIH) 58 0 - 99 mg/dL   Chol/HDL Ratio 3.4 0.0 - 5.0 ratio      Assessment & Plan:   Problem List Items Addressed This Visit       Cardiovascular and Mediastinum   Hypertension associated with diabetes (HCC) - Primary   Aortic atherosclerosis (HCC)     Endocrine    Hyperlipidemia associated with type 2 diabetes mellitus (HCC)   Diabetes mellitus associated with hormonal etiology (HCC)     Other   B12 deficiency     Follow up plan: No follow-ups on file.

## 2023-12-08 LAB — COMPREHENSIVE METABOLIC PANEL WITH GFR
ALT: 21 IU/L (ref 0–44)
AST: 19 IU/L (ref 0–40)
Albumin: 4.5 g/dL (ref 3.8–4.8)
Alkaline Phosphatase: 59 IU/L (ref 44–121)
BUN/Creatinine Ratio: 12 (ref 10–24)
BUN: 18 mg/dL (ref 8–27)
Bilirubin Total: 0.5 mg/dL (ref 0.0–1.2)
CO2: 21 mmol/L (ref 20–29)
Calcium: 10.2 mg/dL (ref 8.6–10.2)
Chloride: 102 mmol/L (ref 96–106)
Creatinine, Ser: 1.55 mg/dL — ABNORMAL HIGH (ref 0.76–1.27)
Globulin, Total: 2.3 g/dL (ref 1.5–4.5)
Glucose: 95 mg/dL (ref 70–99)
Potassium: 4.6 mmol/L (ref 3.5–5.2)
Sodium: 137 mmol/L (ref 134–144)
Total Protein: 6.8 g/dL (ref 6.0–8.5)
eGFR: 45 mL/min/1.73 — ABNORMAL LOW (ref >59)

## 2023-12-08 LAB — LIPID PANEL
Chol/HDL Ratio: 3.3 ratio (ref 0.0–5.0)
Cholesterol, Total: 125 mg/dL (ref 100–199)
HDL: 38 mg/dL — ABNORMAL LOW (ref >39)
LDL Chol Calc (NIH): 68 mg/dL (ref 0–99)
Triglycerides: 104 mg/dL (ref 0–149)
VLDL Cholesterol Cal: 19 mg/dL (ref 5–40)

## 2023-12-08 LAB — HEMOGLOBIN A1C
Est. average glucose Bld gHb Est-mCnc: 131 mg/dL
Hgb A1c MFr Bld: 6.2 % — ABNORMAL HIGH (ref 4.8–5.6)

## 2023-12-11 ENCOUNTER — Telehealth: Payer: Self-pay

## 2023-12-11 ENCOUNTER — Ambulatory Visit: Payer: Self-pay | Admitting: Nurse Practitioner

## 2023-12-11 NOTE — Progress Notes (Signed)
 Complex Care Management Note Care Guide Note  12/11/2023 Name: Carl Bryan MRN: [id number] DOB: 14-Jan-1946   Complex Care Management Outreach Attempts: An unsuccessful telephone outreach was attempted today to offer the patient information about available complex care management services.  Follow Up Plan:  Additional outreach attempts will be made to offer the patient complex care management information and services.   Encounter Outcome:  No Answer  Jeoffrey Buffalo , RMA     [place]  [facility], [facility] Guide  Direct Dial: [phone]  Website: [email or web address]

## 2023-12-12 ENCOUNTER — Telehealth: Payer: Self-pay | Admitting: Nurse Practitioner

## 2023-12-12 NOTE — Telephone Encounter (Unsigned)
 Copied from CRM [id number]. Topic: General - Other >> Dec 12, 2023  4:28 PM Donee H wrote: Reason for CRM: Patient's caregiver Randine called to see if it is possible for patient's  DNR papers to be mailed to his home address. She stated patient was just seen on Aug. 29, 2025 but she forgot to remind him to get them at his visit. She is requesting through mail to avoid patient having to come back up to office. Please follow up with Randine on request [phone]

## 2023-12-13 NOTE — Progress Notes (Signed)
 Complex Care Management Note Care Guide Note  12/13/2023 Name: Carl Bryan MRN: [id number] DOB: June 10, 1945   Complex Care Management Outreach Attempts: A second unsuccessful outreach was attempted today to offer the patient with information about available complex care management services.  Follow Up Plan:  Additional outreach attempts will be made to offer the patient complex care management information and services.   Encounter Outcome:  No Answer  Jeoffrey Buffalo , RMA     [name]  [facility], [facility] Guide  Direct Dial: [phone]  Website: [email or web address]

## 2023-12-13 NOTE — Telephone Encounter (Signed)
 Forwarding to provider team for assistance.

## 2023-12-16 ENCOUNTER — Other Ambulatory Visit: Payer: Self-pay | Admitting: Nurse Practitioner

## 2023-12-16 DIAGNOSIS — E1169 Type 2 diabetes mellitus with other specified complication: Secondary | ICD-10-CM

## 2023-12-17 NOTE — Progress Notes (Signed)
 Complex Care Management Note Care Guide Note  12/17/2023 Name: Carl Bryan MRN: [id number] DOB: 12/17/1945   Complex Care Management Outreach Attempts: A third unsuccessful outreach was attempted today to offer the patient with information about available complex care management services.  Follow Up Plan:  No further outreach attempts will be made at this time. We have been unable to contact the patient to offer or enroll patient in complex care management services.  Encounter Outcome:  No Answer Jeoffrey Buffalo , RMA     [place]  [facility], [facility] Guide  Direct Dial: [phone]  Website: [email or web address]

## 2023-12-18 NOTE — Telephone Encounter (Signed)
 Requested Prescriptions  Pending Prescriptions Disp Refills   metFORMIN  (GLUCOPHAGE ) 500 MG tablet [Pharmacy Med Name: METFORMIN  HYDROCHLORIDE 500 MG Oral Tablet] 360 tablet 0    Sig: TAKE 2 TABLETS TWICE DAILY WITH MEALS     Endocrinology:  Diabetes - Biguanides Failed - 12/18/2023  8:12 AM      Failed - Cr in normal range and within 360 days    Creatinine, Ser  Date Value Ref Range Status  12/07/2023 1.55 (H) 0.76 - 1.27 mg/dL Final   Creatinine, Urine  Date Value Ref Range Status  03/05/2019 82 mg/dL Final    Comment:    Performed at [facility], [address] [place], [place] [address]         Failed - eGFR in normal range and within 360 days    GFR calc Af Amer  Date Value Ref Range Status  01/15/2020 89 >59 mL/min/1.73 Final    Comment:    **[facility] currently reports eGFR in compliance with the current**   recommendations of the [employer]. [facility] will   update reporting as new guidelines are published from the NKF-ASN   Task force.    GFR calc non Af Amer  Date Value Ref Range Status  01/15/2020 77 >59 mL/min/1.73 Final   eGFR  Date Value Ref Range Status  12/07/2023 45 (L) >59 mL/min/1.73 Final         Failed - B12 Level in normal range and within 720 days    Vitamin B-12  Date Value Ref Range Status  09/26/2022 1,421 (H) 232 - 1,245 pg/mL Final         Failed - CBC within normal limits and completed in the last 12 months    WBC  Date Value Ref Range Status  11/28/2022 8.4 3.4 - 10.8 x10E3/uL Final  03/10/2019 6.4 4.0 - 10.5 K/uL Final   RBC  Date Value Ref Range Status  11/28/2022 4.54 4.14 - 5.80 x10E6/uL Final  03/10/2019 4.14 (L) 4.22 - 5.81 MIL/uL Final   Hemoglobin  Date Value Ref Range Status  11/28/2022 13.1 13.0 - 17.7 g/dL Final   Hematocrit  Date Value Ref Range Status  11/28/2022 39.5 37.5 - 51.0 % Final   MCHC  Date Value Ref Range Status  11/28/2022 33.2 31.5 - 35.7 g/dL Final  88/69/7979 65.3 30.0 -  36.0 g/dL Final   [facility]  Date Value Ref Range Status  11/28/2022 28.9 26.6 - 33.0 pg Final  03/10/2019 31.4 26.0 - 34.0 pg Final   MCV  Date Value Ref Range Status  11/28/2022 87 79 - 97 fL Final   No results found for: PLTCOUNTKUC, LABPLAT, POCPLA RDW  Date Value Ref Range Status  11/28/2022 14.5 11.6 - 15.4 % Final         Passed - HBA1C is between 0 and 7.9 and within 180 days    HB A1C (BAYER DCA - WAIVED)  Date Value Ref Range Status  09/26/2022 6.9 (H) 4.8 - 5.6 % Final    Comment:             Prediabetes: 5.7 - 6.4          Diabetes: >6.4          Glycemic control for adults with diabetes: <7.0    Hgb A1c MFr Bld  Date Value Ref Range Status  12/07/2023 6.2 (H) 4.8 - 5.6 % Final    Comment:  Prediabetes: 5.7 - 6.4          Diabetes: >6.4          Glycemic control for adults with diabetes: <7.0          Passed - Valid encounter within last 6 months    Recent Outpatient Visits           1 week ago Hypertension associated with diabetes [facility])   [place] [facility] Melvin Pao, NP   3 months ago Hypertension associated with diabetes [facility])   [place] [facility]vin Pao, NP               losartan  (COZAAR ) 100 MG tablet [Pharmacy Med Name: LOSARTAN  POTASSIUM 100 MG Oral Tablet] 90 tablet 0    Sig: TAKE 1 TABLET EVERY DAY     Cardiovascular:  Angiotensin Receptor Blockers Failed - 12/18/2023  8:12 AM      Failed - Cr in normal range and within 180 days    Creatinine, Ser  Date Value Ref Range Status  12/07/2023 1.55 (H) 0.76 - 1.27 mg/dL Final   Creatinine, Urine  Date Value Ref Range Status  03/05/2019 82 mg/dL Final    Comment:    Performed at [facility], [address] [place], [place] [address]         Passed - K in normal range and within 180 days    Potassium  Date Value Ref Range Status  12/07/2023 4.6 3.5 - 5.2 mmol/L Final         Passed - Patient is not  pregnant      Passed - Last BP in normal range    BP Readings from Last 1 Encounters:  12/07/23 (!) 107/58         Passed - Valid encounter within last 6 months    Recent Outpatient Visits           1 week ago Hypertension associated with diabetes [facility]   [place] [facility] Melvin Pao, NP   3 months ago Hypertension associated with diabetes [facility]   [place] [facility] Melvin Pao, NP               glipiZIDE  (GLUCOTROL ) 5 MG tablet [Pharmacy Med Name: GLIPIZIDE  5 MG Oral Tablet] 90 tablet 0    Sig: TAKE 1 TABLET EVERY DAY BEFORE BREAKFAST     Endocrinology:  Diabetes - Sulfonylureas Failed - 12/18/2023  8:12 AM      Failed - Cr in normal range and within 360 days    Creatinine, Ser  Date Value Ref Range Status  12/07/2023 1.55 (H) 0.76 - 1.27 mg/dL Final   Creatinine, Urine  Date Value Ref Range Status  03/05/2019 82 mg/dL Final    Comment:    Performed at [facility], [address] [place], [place] [address]         Passed - HBA1C is between 0 and 7.9 and within 180 days    HB A1C (BAYER DCA - WAIVED)  Date Value Ref Range Status  09/26/2022 6.9 (H) 4.8 - 5.6 % Final    Comment:             Prediabetes: 5.7 - 6.4          Diabetes: >6.4          Glycemic control for adults with diabetes: <7.0    Hgb A1c MFr Bld  Date Value Ref Range  Status  12/07/2023 6.2 (H) 4.8 - 5.6 % Final    Comment:             Prediabetes: 5.7 - 6.4          Diabetes: >6.4          Glycemic control for adults with diabetes: <7.0          Passed - Valid encounter within last 6 months    Recent Outpatient Visits           1 week ago Hypertension associated with diabetes [facility])   [place] [facility] Melvin Pao, NP   3 months ago Hypertension associated with diabetes [facility])   [place] [facility] Melvin Pao, NP               amLODipine  (NORVASC ) 10 MG tablet [Pharmacy Med  Name: AMLODIPINE  BESYLATE 10 MG Oral Tablet] 90 tablet 0    Sig: TAKE 1 TABLET EVERY DAY     Cardiovascular: Calcium  Channel Blockers 2 Passed - 12/18/2023  8:12 AM      Passed - Last BP in normal range    BP Readings from Last 1 Encounters:  12/07/23 (!) 107/58         Passed - Last Heart Rate in normal range    Pulse Readings from Last 1 Encounters:  12/07/23 60         Passed - Valid encounter within last 6 months    Recent Outpatient Visits           1 week ago Hypertension associated with diabetes [facility])   [place] [facility] Melvin Pao, NP   3 months ago Hypertension associated with diabetes [facility])   [place] [facility] Melvin Pao, NP               tamsulosin  (FLOMAX ) 0.4 MG CAPS capsule [Pharmacy Med Name: TAMSULOSIN  HYDROCHLORIDE 0.4 MG Oral Capsule] 90 capsule 0    Sig: TAKE 1 CAPSULE EVERY DAY     Urology: Alpha-Adrenergic Blocker Failed - 12/18/2023  8:12 AM      Failed - PSA in normal range and within 360 days    Prostate Specific Ag, Serum  Date Value Ref Range Status  07/28/2020 2.4 0.0 - 4.0 ng/mL Final    Comment:    Roche ECLIA methodology. According to the American Urological Association, Serum PSA should decrease and remain at undetectable levels after radical prostatectomy. The AUA defines biochemical recurrence as an initial PSA value 0.2 ng/mL or greater followed by a subsequent confirmatory PSA value 0.2 ng/mL or greater. Values obtained with different assay methods or kits cannot be used interchangeably. Results cannot be interpreted as absolute evidence of the presence or absence of malignant disease.          Passed - Last BP in normal range    BP Readings from Last 1 Encounters:  12/07/23 (!) 107/58         Passed - Valid encounter within last 12 months    Recent Outpatient Visits           1 week ago Hypertension associated with diabetes [facility])   [place] [facility]  Melvin Pao, NP   3 months ago Hypertension associated with diabetes [facility])   [place] [facility]vin Pao, NP               rosuvastatin  (CRESTOR ) 40 MG tablet [Pharmacy Med Name: ROSUVASTATIN   CALCIUM  40 MG Oral Tablet] 90 tablet 0    Sig: TAKE 1 TABLET EVERY DAY     Cardiovascular:  Antilipid - Statins 2 Failed - 12/18/2023  8:12 AM      Failed - Cr in normal range and within 360 days    Creatinine, Ser  Date Value Ref Range Status  12/07/2023 1.55 (H) 0.76 - 1.27 mg/dL Final   Creatinine, Urine  Date Value Ref Range Status  03/05/2019 82 mg/dL Final    Comment:    Performed at [facility], [address] [place], [place] [address]         Failed - Lipid Panel in normal range within the last 12 months    Cholesterol, Total  Date Value Ref Range Status  12/07/2023 125 100 - 199 mg/dL Final   Cholesterol [place]  Date Value Ref Range Status  03/28/2018 122 <200 mg/dL Final    Comment:                            Desirable                <200                         Borderline High      200- 239                         High                     >239    LDL Chol Calc (NIH)  Date Value Ref Range Status  12/07/2023 68 0 - 99 mg/dL Final   HDL  Date Value Ref Range Status  12/07/2023 38 (L) >39 mg/dL Final   Triglycerides  Date Value Ref Range Status  12/07/2023 104 0 - 149 mg/dL Final   Triglycerides Piccolo,Waived  Date Value Ref Range Status  03/28/2018 232 (H) <150 mg/dL Final    Comment:                            Normal                   <150                         Borderline High     150 - 199                         High                200 - 499                         Very High                >499          Passed - Patient is not pregnant      Passed - Valid encounter within last 12 months    Recent Outpatient Visits           1 week ago Hypertension associated with [facility])   [place] [facility] Melvin Pao, NP   3 months ago Hypertension associated with diabetes (HCC)  [place] [facility] Melvin Pao, NP

## 2023-12-30 ENCOUNTER — Other Ambulatory Visit: Payer: Self-pay | Admitting: Nurse Practitioner

## 2024-01-01 NOTE — Telephone Encounter (Signed)
 Requested medication (s) are due for refill today: no  Requested medication (s) are on the active medication list: yes  Last refill:  10/18/23 #90  Future visit scheduled: yes  Notes to clinic:    Uric Acid in normal range and within 360 days   Cr in normal range and within 360 days   CBC within normal limits and completed in the last 12 months     Requested Prescriptions  Pending Prescriptions Disp Refills   allopurinol  (ZYLOPRIM ) 300 MG tablet [Pharmacy Med Name: ALLOPURINOL  300 MG Oral Tablet] 90 tablet 3    Sig: TAKE 1 TABLET EVERY DAY     Endocrinology:  Gout Agents - allopurinol  Failed - 01/01/2024  8:06 AM      Failed - Uric Acid in normal range and within 360 days    Uric Acid  Date Value Ref Range Status  07/28/2020 2.4 (L) 3.8 - 8.4 mg/dL Final    Comment:               Therapeutic target for gout patients: <6.0         Failed - Cr in normal range and within 360 days    Creatinine, Ser  Date Value Ref Range Status  12/07/2023 1.55 (H) 0.76 - 1.27 mg/dL Final   Creatinine, Urine  Date Value Ref Range Status  03/05/2019 82 mg/dL Final    Comment:    Performed at [facility], [address] [place], [place] [address]         Failed - CBC within normal limits and completed in the last 12 months    WBC  Date Value Ref Range Status  11/28/2022 8.4 3.4 - 10.8 x10E3/uL Final  03/10/2019 6.4 4.0 - 10.5 K/uL Final   RBC  Date Value Ref Range Status  11/28/2022 4.54 4.14 - 5.80 x10E6/uL Final  03/10/2019 4.14 (L) 4.22 - 5.81 MIL/uL Final   Hemoglobin  Date Value Ref Range Status  11/28/2022 13.1 13.0 - 17.7 g/dL Final   Hematocrit  Date Value Ref Range Status  11/28/2022 39.5 37.5 - 51.0 % Final   MCHC  Date Value Ref Range Status  11/28/2022 33.2 31.5 - 35.7 g/dL Final  88/69/7979 65.3 30.0 - 36.0 g/dL Final   [facility]  Date Value Ref Range Status  11/28/2022 28.9 26.6 - 33.0 pg Final  03/10/2019 31.4 26.0 - 34.0 pg Final   MCV  Date Value  Ref Range Status  11/28/2022 87 79 - 97 fL Final   No results found for: PLTCOUNTKUC, LABPLAT, POCPLA RDW  Date Value Ref Range Status  11/28/2022 14.5 11.6 - 15.4 % Final         Passed - Valid encounter within last 12 months    Recent Outpatient Visits           3 weeks ago Hypertension associated with diabetes [facility])   [place] [facility]vin Pao, NP   3 months ago Hypertension associated with diabetes [facility])   [place] [facility] Melvin Pao, NP

## 2024-01-02 ENCOUNTER — Ambulatory Visit (INDEPENDENT_AMBULATORY_CARE_PROVIDER_SITE_OTHER): Admitting: Nurse Practitioner

## 2024-01-02 ENCOUNTER — Encounter: Payer: Self-pay | Admitting: Nurse Practitioner

## 2024-01-02 VITALS — BP 125/61 | HR 73 | Temp 98.1°F | Ht 65.0 in | Wt 158.8 lb

## 2024-01-02 DIAGNOSIS — Z0289 Encounter for other administrative examinations: Secondary | ICD-10-CM

## 2024-01-02 NOTE — Progress Notes (Signed)
 BP 125/61   Pulse 73   Temp 98.1 F (36.7 C) (Oral)   Ht 5' 5 (1.651 m)   Wt 158 lb 12.8 oz (72 kg)   SpO2 93%   BMI 26.43 kg/m    Subjective:    Patient ID: Carl Bryan, male    DOB: 1945/12/18, 78 y.o.   MRN: [id number]  HPI: Carl Bryan is a 78 y.o. male  Chief Complaint  Patient presents with   Forms   Patient presents to clinic to have DNR form completed.    Relevant past medical, surgical, family and social history reviewed and updated as indicated. Interim medical history since our last visit reviewed. Allergies and medications reviewed and updated.  Review of Systems  All other systems reviewed and are negative.   Per HPI unless specifically indicated above     Objective:    BP 125/61   Pulse 73   Temp 98.1 F (36.7 C) (Oral)   Ht 5' 5 (1.651 m)   Wt 158 lb 12.8 oz (72 kg)   SpO2 93%   BMI 26.43 kg/m   Wt Readings from Last 3 Encounters:  01/02/24 158 lb 12.8 oz (72 kg)  12/07/23 157 lb (71.2 kg)  09/06/23 163 lb 9.6 oz (74.2 kg)    Physical Exam Vitals and nursing note reviewed.  Constitutional:      General: He is not in acute distress.    Appearance: Normal appearance. He is not ill-appearing, toxic-appearing or diaphoretic.  HENT:     Head: Normocephalic.     Right Ear: External ear normal.     Left Ear: External ear normal.     Nose: Nose normal. No congestion or rhinorrhea.     Mouth/Throat:     Mouth: Mucous membranes are moist.  Eyes:     General:        Right eye: No discharge.        Left eye: No discharge.     Extraocular Movements: Extraocular movements intact.     Conjunctiva/sclera: Conjunctivae normal.     Pupils: Pupils are equal, round, and reactive to light.  Cardiovascular:     Rate and Rhythm: Normal rate and regular rhythm.     Heart sounds: No murmur heard. Pulmonary:     Effort: Pulmonary effort is normal. No respiratory distress.     Breath sounds: Normal breath sounds. No wheezing, rhonchi or rales.   Abdominal:     General: Abdomen is flat. Bowel sounds are normal.  Musculoskeletal:     Cervical back: Normal range of motion and neck supple.  Skin:    General: Skin is warm and dry.     Capillary Refill: Capillary refill takes less than 2 seconds.  Neurological:     General: No focal deficit present.     Mental Status: He is alert and oriented to person, place, and time.  Psychiatric:        Mood and Affect: Mood normal.        Behavior: Behavior normal.        Thought Content: Thought content normal.        Judgment: Judgment normal.     Results for orders placed or performed in visit on 12/07/23  Hemoglobin A1c   Collection Time: 12/07/23 10:01 AM  Result Value Ref Range   Hgb A1c MFr Bld 6.2 (H) 4.8 - 5.6 %   Est. average glucose Bld gHb Est-mCnc 131 mg/dL  Comprehensive metabolic panel  with GFR   Collection Time: 12/07/23 10:01 AM  Result Value Ref Range   Glucose 95 70 - 99 mg/dL   BUN 18 8 - 27 mg/dL   Creatinine, Ser 8.44 (H) 0.76 - 1.27 mg/dL   eGFR 45 (L) >40 fO/fpw/8.26   BUN/Creatinine Ratio 12 10 - 24   Sodium 137 134 - 144 mmol/L   Potassium 4.6 3.5 - 5.2 mmol/L   Chloride 102 96 - 106 mmol/L   CO2 21 20 - 29 mmol/L   Calcium  10.2 8.6 - 10.2 mg/dL   Total Protein 6.8 6.0 - 8.5 g/dL   Albumin 4.5 3.8 - 4.8 g/dL   Globulin, Total 2.3 1.5 - 4.5 g/dL   Bilirubin Total 0.5 0.0 - 1.2 mg/dL   Alkaline Phosphatase 59 44 - 121 IU/L   AST 19 0 - 40 IU/L   ALT 21 0 - 44 IU/L  Lipid panel   Collection Time: 12/07/23 10:01 AM  Result Value Ref Range   Cholesterol, Total 125 100 - 199 mg/dL   Triglycerides 895 0 - 149 mg/dL   HDL 38 (L) >60 mg/dL   VLDL Cholesterol Cal 19 5 - 40 mg/dL   LDL Chol Calc (NIH) 68 0 - 99 mg/dL   Chol/HDL Ratio 3.3 0.0 - 5.0 ratio      Assessment & Plan:   Problem List Items Addressed This Visit   None Visit Diagnoses       Encounter for completion of form with patient    -  Primary   DNR and MOST form completed for patient  during visit today.        Follow up plan: Return if symptoms worsen or fail to improve.   A total of 20 minutes were spent on this encounter today.  When total time is documented, this includes both the face-to-face and non-face-to-face time personally spent before, during and after the visit on the date of the encounter reviewing forms, discussing patient options, and completing forms during visit.SABRA

## 2024-01-07 NOTE — Progress Notes (Signed)
 Carl Bryan                                          MRN: [id number]   01/07/2024   The VBCI Quality Team Specialist reviewed this patient medical record for the purposes of chart review for care gap closure. The following were reviewed: chart review for care gap closure-kidney health evaluation for diabetes:eGFR  and uACR.    VBCI Quality Team

## 2024-01-22 ENCOUNTER — Telehealth: Payer: Self-pay | Admitting: Nurse Practitioner

## 2024-01-22 NOTE — Telephone Encounter (Signed)
 Copied from CRM [id number]. Topic: Medicare AWV >> Jan 22, 2024 11:31 AM Nathanel DEL wrote: Reason for CRM: Called 01/22/2024 to resched AWV - NO VOICEMAIL  Nathanel Paschal; Care Guide Ambulatory Clinical Support [place] [facility] Direct Dial: [phone]

## 2024-01-31 ENCOUNTER — Ambulatory Visit

## 2024-01-31 ENCOUNTER — Other Ambulatory Visit: Payer: Self-pay | Admitting: Nurse Practitioner

## 2024-01-31 ENCOUNTER — Telehealth: Payer: Self-pay | Admitting: Nurse Practitioner

## 2024-01-31 NOTE — Telephone Encounter (Signed)
 Request sent to pcp.

## 2024-01-31 NOTE — Telephone Encounter (Signed)
 Prescription Request  01/31/2024  LOV: 01/02/2024  What is the name of the medication or equipment? allopurinol  (ZYLOPRIM ) 300 MG tablet   Have you contacted your pharmacy to request a refill? No   Which pharmacy would you like this sent to?  [facility] [place], [place] - [address] [place] [address] Phone: [phone] Fax: [phone]  Patient notified that their request is being sent to the clinical staff for review and that they should receive a response within 2 business days.   Please advise at Mobile [phone] (mobile)

## 2024-02-01 MED ORDER — ALLOPURINOL 300 MG PO TABS: 300.0000 mg | ORAL_TABLET | Freq: Every day | ORAL | 1 refills | Status: DC

## 2024-02-26 LAB — MICROALBUMIN / CREATININE URINE RATIO: Microalb Creat Ratio: 250

## 2024-03-11 ENCOUNTER — Ambulatory Visit: Admitting: Nurse Practitioner

## 2024-03-11 ENCOUNTER — Other Ambulatory Visit: Payer: Self-pay | Admitting: Nurse Practitioner

## 2024-03-11 DIAGNOSIS — E1169 Type 2 diabetes mellitus with other specified complication: Secondary | ICD-10-CM

## 2024-03-11 NOTE — Telephone Encounter (Unsigned)
 Copied from CRM #[id number]. Topic: Clinical - Medication Refill >> Mar 11, 2024  9:09 AM Kendralyn S wrote: Medication: lancets for true metrix  Has the patient contacted their pharmacy? Yes (Agent: If no, request that the patient contact the pharmacy for the refill. If patient does not wish to contact the pharmacy document the reason why and proceed with request.) (Agent: If yes, when and what did the pharmacy advise?)  This is the patient's preferred pharmacy:  [facility] [address], [place] - [address]  [place] [address] Phone: [phone] Fax: [phone]  [facility] - [place], [place] - [address] [place] [address] Phone: [phone] Fax: [phone]  Is this the correct pharmacy for this prescription? Yes If no, delete pharmacy and type the correct one.   Has the prescription been filled recently? No  Is the patient out of the medication? No  Has the patient been seen for an appointment in the last year OR does the patient have an upcoming appointment? Yes  Can we respond through MyChart? No  Agent: Please be advised that Rx refills may take up to 3 business days. We ask that you follow-up with your pharmacy.

## 2024-03-13 MED ORDER — TRUE METRIX BLOOD GLUCOSE TEST VI STRP: ORAL_STRIP | 3 refills | Status: AC

## 2024-03-13 NOTE — Telephone Encounter (Signed)
 Requested Prescriptions  Pending Prescriptions Disp Refills   glucose blood (TRUE METRIX BLOOD GLUCOSE TEST) test strip 200 strip 3    Sig: Use as instructed     Endocrinology: Diabetes - Testing Supplies Passed - 03/13/2024  5:20 PM      Passed - Valid encounter within last 12 months    Recent Outpatient Visits           2 months ago Encounter for completion of form with patient   [place] [facility] Melvin Pao, NP   3 months ago Hypertension associated with diabetes [facility])   [place] [facility] Melvin Pao, NP   6 months ago Hypertension associated with diabetes [facility])   [place] [facility] Melvin Pao, NP

## 2024-04-01 ENCOUNTER — Telehealth: Payer: Self-pay | Admitting: Nurse Practitioner

## 2024-04-01 ENCOUNTER — Ambulatory Visit (INDEPENDENT_AMBULATORY_CARE_PROVIDER_SITE_OTHER): Admitting: Emergency Medicine

## 2024-04-01 VITALS — BP 110/62 | Ht 67.0 in | Wt 159.8 lb

## 2024-04-01 DIAGNOSIS — Z0000 Encounter for general adult medical examination without abnormal findings: Secondary | ICD-10-CM

## 2024-04-01 MED ORDER — LANCETS MISC: 1.0000 | Freq: Every day | 0 refills | Status: DC

## 2024-04-01 NOTE — Telephone Encounter (Signed)
Request for lancets.

## 2024-04-01 NOTE — Addendum Note (Signed)
 Addended by: DEBBY MOCCASIN on: 04/01/2024 04:02 PM   Modules accepted: Orders, Level of Service

## 2024-04-01 NOTE — Progress Notes (Addendum)
 "  Chief Complaint  Patient presents with   Medicare Wellness     Subjective:   Carl Bryan is a 78 y.o. male who presents for a Medicare Annual Wellness Visit.  Visit info / Clinical Intake: Medicare Wellness Visit Type:: Subsequent Annual Wellness Visit Persons participating in visit and providing information:: patient & caregiver Leone, caregiver) Medicare Wellness Visit Mode:: In-person (required for WTM) Interpreter Needed?: No Pre-visit prep was completed: yes AWV questionnaire completed by patient prior to visit?: no Living arrangements:: (!) lives alone Patient's Overall Health Status Rating: good Typical amount of pain: none Does pain affect daily life?: no Are you currently prescribed opioids?: no  Dietary Habits and Nutritional Risks How many meals a day?: 3 Eats fruit and vegetables daily?: (!) no (not every day) Most meals are obtained by: having others provide food Leone, caregiver prepares meals) In the last 2 weeks, have you had any of the following?: none Diabetic:: (!) yes Any non-healing wounds?: (!) yes (hole in left great toe) How often do you check your BS?: 1 (FBS 176 per patient) Would you like to be referred to a Nutritionist or for Diabetic Management? : no  Functional Status Activities of Daily Living (to include ambulation/medication): Independent Ambulation: Independent with device- listed below Home Assistive Devices/Equipment: Crutches; Prosthesis; Other (Comment); Shower/tub chair (hearing aids) Medication Administration: Independent Home Management (perform basic housework or laundry): Independent Manage your own finances?: yes Primary transportation is: driving Concerns about vision?: no *vision screening is required for WTM* Concerns about hearing?: (!) yes Uses hearing aids?: no (has hearing aids, but doesn't wear)  Fall Screening Falls in the past year?: 1 Number of falls in past year: 1 Was there an injury with Fall?: 0 Fall  Risk Category Calculator: 2 Patient Fall Risk Level: Moderate Fall Risk  Fall Risk Patient at Risk for Falls Due to: History of fall(s); Impaired balance/gait; Impaired mobility; Orthopedic patient Fall risk Follow up: Falls evaluation completed; Education provided; Falls prevention discussed  Home and Transportation Safety: All rugs have non-skid backing?: yes All stairs or steps have railings?: yes Grab bars in the bathtub or shower?: (!) no Have non-skid surface in bathtub or shower?: yes Good home lighting?: yes Regular seat belt use?: yes Hospital stays in the last year:: no  Cognitive Assessment Difficulty concentrating, remembering, or making decisions? : no Will 6CIT or Mini Cog be Completed: yes What year is it?: 4 points (2024) What month is it?: 0 points Give patient an address phrase to remember (5 components): [address] [place] About what time is it?: 0 points Count backwards from 20 to 1: 0 points Say the months of the year in reverse: 4 points (got to Oct) Repeat the address phrase from earlier: 8 points 6 CIT Score: 16 points  Advance Directives (For Healthcare) Does Patient Have a Medical Advance Directive?: Yes Does patient want to make changes to medical advance directive?: No - Patient declined Type of Advance Directive: Out of facility DNR (pink MOST or yellow form) Out of facility DNR (pink MOST or yellow form) in Chart? (Ambulatory ONLY): Yes - validated most recent copy scanned in chart  Reviewed/Updated  Reviewed/Updated: Reviewed All (Medical, Surgical, Family, Medications, Allergies, Care Teams, Patient Goals)    Allergies (verified) Doxycycline calcium , Tussionex pennkinetic er [hydrocod poli-chlorphe poli er], and Vytorin [ezetimibe-simvastatin]   Current Medications (verified) Outpatient Encounter Medications as of 04/01/2024  Medication Sig   acetaminophen  (TYLENOL ) 500 MG tablet Take 1,000 mg by mouth  every 6 (six) hours as needed.    Alcohol Swabs (B-D SINGLE USE SWABS REGULAR) PADS 1 Application by Does not apply route in the morning, at noon, and at bedtime.   allopurinol  (ZYLOPRIM ) 300 MG tablet Take 1 tablet (300 mg total) by mouth daily.   amLODipine  (NORVASC ) 10 MG tablet TAKE 1 TABLET EVERY DAY   Blood Glucose Calibration (TRUE METRIX LEVEL 1) Low SOLN 1 Application by In Vitro route once a week.   cholecalciferol  25 MCG (1000 UT) tablet TAKE 2 TABLETS EVERY DAY   cyanocobalamin  (VITAMIN B12) 1000 MCG tablet Take 1 tablet (1,000 mcg total) by mouth daily.   empagliflozin  (JARDIANCE ) 10 MG TABS tablet Take 1 tablet (10 mg total) by mouth daily before breakfast.   glipiZIDE  (GLUCOTROL ) 5 MG tablet TAKE 1 TABLET EVERY DAY BEFORE BREAKFAST   glucose blood (TRUE METRIX BLOOD GLUCOSE TEST) test strip Use as instructed   losartan  (COZAAR ) 100 MG tablet TAKE 1 TABLET EVERY DAY   meloxicam  (MOBIC ) 15 MG tablet Take 1 tablet (15 mg total) by mouth daily.   metFORMIN  (GLUCOPHAGE ) 500 MG tablet TAKE 2 TABLETS TWICE DAILY WITH MEALS   pantoprazole  (PROTONIX ) 40 MG tablet TAKE 1 TABLET EVERY DAY   rosuvastatin  (CRESTOR ) 40 MG tablet TAKE 1 TABLET EVERY DAY   tamsulosin  (FLOMAX ) 0.4 MG CAPS capsule TAKE 1 CAPSULE EVERY DAY   albuterol  (VENTOLIN  HFA) 108 (90 Base) MCG/ACT inhaler Inhale 2 puffs into the lungs every 6 (six) hours as needed for wheezing or shortness of breath. (Patient not taking: Reported on 04/01/2024)   Albuterol -Budesonide (AIRSUPRA ) 90-80 MCG/ACT AERO Inhale 2 puffs into the lungs every 6 (six) hours as needed. (Patient not taking: Reported on 04/01/2024)   Blood Glucose Calibration (TRUE METRIX LEVEL 3) High SOLN 1 Application by In Vitro route once a week.   fluticasone -salmeterol (ADVAIR) 100-50 MCG/ACT AEPB Inhale 1 puff into the lungs 2 (two) times daily. (Patient not taking: Reported on 04/01/2024)   Spacer/Aero-Holding Chambers (AEROCHAMBER PLUS WITH MASK) inhaler Use as instructed (Patient not taking:  Reported on 04/01/2024)   No facility-administered encounter medications on file as of 04/01/2024.    History: Past Medical History:  Diagnosis Date   Bleeding ulcer    Bronchospasm    Diabetes mellitus without complication (HCC)    Type 2   Diverticulosis    Pure hypercholesterolemia    Traumatic amputation of leg above knee [facility])    Past Surgical History:  Procedure Laterality Date   ABOVE KNEE LEG AMPUTATION  04/10/1969   APPENDECTOMY     KNEE SURGERY     x 2.   REPLACEMENT TOTAL KNEE Left    Family History  Problem Relation Age of Onset   Cancer Mother        Colon cancer   Lung disease Father    Social History   Occupational History   Occupation: Retired  Tobacco Use   Smoking status: Former    Current packs/day: 0.00    Types: Cigarettes    Quit date: 09/29/1964    Years since quitting: 59.5   Smokeless tobacco: Never  Vaping Use   Vaping status: Never Used  Substance and Sexual Activity   Alcohol use: No    Alcohol/week: 0.0 standard drinks of alcohol   Drug use: No   Sexual activity: Not Currently   Tobacco Counseling Counseling given: Not Answered  SDOH Screenings   Food Insecurity: No Food Insecurity (04/01/2024)  Housing: Low Risk (04/01/2024)  Transportation  Needs: No Transportation Needs (04/01/2024)  Utilities: Not At Risk (04/01/2024)  Alcohol Screen: Low Risk (08/03/2021)  Depression (PHQ2-9): Medium Risk (04/01/2024)  Financial Resource Strain: Low Risk (08/03/2021)  Physical Activity: Inactive (04/01/2024)  Social Connections: Moderately Isolated (04/01/2024)  Stress: No Stress Concern Present (04/01/2024)  Tobacco Use: Medium Risk (04/01/2024)  Health Literacy: Inadequate Health Literacy (04/01/2024)   See flowsheets for full screening details  Depression Screen PHQ 2 & 9 Depression Scale- Over the past 2 weeks, how often have you been bothered by any of the following problems? Little interest or pleasure in doing things:  0 Feeling down, depressed, or hopeless (PHQ Adolescent also includes...irritable): 3 PHQ-2 Total Score: 3 Trouble falling or staying asleep, or sleeping too much: 0 Feeling tired or having little energy: 3 Poor appetite or overeating (PHQ Adolescent also includes...weight loss): 0 Feeling bad about yourself - or that you are a failure or have let yourself or your family down: 0 Trouble concentrating on things, such as reading the newspaper or watching television (PHQ Adolescent also includes...like school work): 0 Moving or speaking so slowly that other people could have noticed. Or the opposite - being so fidgety or restless that you have been moving around a lot more than usual: 0 Thoughts that you would be better off dead, or of hurting yourself in some way: 0 PHQ-9 Total Score: 6 If you checked off any problems, how difficult have these problems made it for you to do your work, take care of things at home, or get along with other people?: Somewhat difficult  Depression Treatment Depression Interventions/Treatment : Patient refuses Treatment     Goals Addressed               This Visit's Progress     DIET - EAT MORE FRUITS AND VEGETABLES (pt-stated)               Objective:    Today's Vitals   04/01/24 1524  BP: 110/62  Weight: 159 lb 12.8 oz (72.5 kg)  Height: 5' 7 (1.702 m)   Body mass index is 25.03 kg/m.  Hearing/Vision screen Hearing Screening - Comments:: Has hearing aids, but doesn't wear them Vision Screening - Comments:: Needs DM eye exam. Patient to call and schedule at [facility] Immunizations and Health Maintenance Health Maintenance  Topic Date Due   OPHTHALMOLOGY EXAM  02/17/2023   COVID-19 Vaccine (6 - 2025-26 season) 12/10/2023   FOOT EXAM  03/27/2024   Influenza Vaccine  07/08/2024 (Originally 11/09/2023)   HEMOGLOBIN A1C  06/07/2024   Diabetic kidney evaluation - eGFR measurement  12/06/2024   Diabetic kidney evaluation - Urine  ACR  02/25/2025   Medicare Annual Wellness (AWV)  04/01/2025   DTaP/Tdap/Td (3 - Td or Tdap) 10/18/2025   Pneumococcal Vaccine: 50+ Years  Completed   Meningococcal B Vaccine  Aged Out   Colonoscopy  Discontinued   Hepatitis C Screening  Discontinued   Zoster Vaccines- Shingrix  Discontinued        Assessment/Plan:  This is a routine wellness examination for Kanai.  Patient Care Team: Melvin Pao, NP as PCP - General Darliss Rogue, MD as PCP - Cardiology ([facility], [facility])  I have personally reviewed and noted the following in the patients chart:   Medical and social history Use of alcohol, tobacco or illicit drugs  Current medications and supplements including opioid prescriptions. Functional ability and status Nutritional status Physical activity Advanced directives List  of other physicians Hospitalizations, surgeries, and ER visits in previous 12 months Vitals Screenings to include cognitive, depression, and falls Referrals and appointments  No orders of the defined types were placed in this encounter.  In addition, I have reviewed and discussed with patient certain preventive protocols, quality metrics, and best practice recommendations. A written personalized care plan for preventive services as well as general preventive health recommendations were provided to patient.   Vina Ned, CMA   04/01/2024   Return in 1 year (on 04/09/2025) for Medicare Annual Wellness Visit.  After Visit Summary: (In Person-Printed) AVS printed and given to the patient  Nurse Notes:  6 CIT Score - 16 Tracy, care giver was present during today's visit RS cancelled appt from 03/11/24 to 04/07/24 FBS this morning per patient was 176 Needs DM eye exam. Patient to call and schedule Needs DM foot exam at next OV on 04/07/24 Declined flu, shingles and covid vaccines Declined DM & Nutrition education referral  Chief Complaint   Patient presents with   Medicare Wellness     Subjective:   Carl Bryan is a 78 y.o. male who presents for a Medicare Annual Wellness Visit.  Visit info / Clinical Intake: Medicare Wellness Visit Type:: Subsequent Annual Wellness Visit Persons participating in visit and providing information:: patient & caregiver Leone, caregiver) Medicare Wellness Visit Mode:: In-person (required for WTM) Interpreter Needed?: No Pre-visit prep was completed: yes AWV questionnaire completed by patient prior to visit?: no Living arrangements:: (!) lives alone Patient's Overall Health Status Rating: good Typical amount of pain: none Does pain affect daily life?: no Are you currently prescribed opioids?: no  Dietary Habits and Nutritional Risks How many meals a day?: 3 Eats fruit and vegetables daily?: (!) no (not every day) Most meals are obtained by: having others provide food Leone, caregiver prepares meals) In the last 2 weeks, have you had any of the following?: none Diabetic:: (!) yes Any non-healing wounds?: (!) yes (hole in left great toe) How often do you check your BS?: 1 (FBS 176 per patient) Would you like to be referred to a Nutritionist or for Diabetic Management? : no  Functional Status Activities of Daily Living (to include ambulation/medication): Independent Ambulation: Independent with device- listed below Home Assistive Devices/Equipment: Crutches; Prosthesis; Other (Comment); Shower/tub chair (hearing aids) Medication Administration: Independent Home Management (perform basic housework or laundry): Independent Manage your own finances?: yes Primary transportation is: driving Concerns about vision?: no *vision screening is required for WTM* Concerns about hearing?: (!) yes Uses hearing aids?: no (has hearing aids, but doesn't wear)  Fall Screening Falls in the past year?: 1 Number of falls in past year: 1 Was there an injury with Fall?: 0 Fall Risk Category  Calculator: 2 Patient Fall Risk Level: Moderate Fall Risk  Fall Risk Patient at Risk for Falls Due to: History of fall(s); Impaired balance/gait; Impaired mobility; Orthopedic patient Fall risk Follow up: Falls evaluation completed; Education provided; Falls prevention discussed  Home and Transportation Safety: All rugs have non-skid backing?: yes All stairs or steps have railings?: yes Grab bars in the bathtub or shower?: (!) no Have non-skid surface in bathtub or shower?: yes Good home lighting?: yes Regular seat belt use?: yes Hospital stays in the last year:: no  Cognitive Assessment Difficulty concentrating, remembering, or making decisions? : no Will 6CIT or Mini Cog be Completed: yes What year is it?: 4 points (2024) What month is it?: 0 points Give patient an address phrase to  remember (5 components): [address] [place] About what time is it?: 0 points Count backwards from 20 to 1: 0 points Say the months of the year in reverse: 4 points (got to Oct) Repeat the address phrase from earlier: 8 points 6 CIT Score: 16 points  Advance Directives (For Healthcare) Does Patient Have a Medical Advance Directive?: Yes Does patient want to make changes to medical advance directive?: No - Patient declined Type of Advance Directive: Out of facility DNR (pink MOST or yellow form) Out of facility DNR (pink MOST or yellow form) in Chart? (Ambulatory ONLY): Yes - validated most recent copy scanned in chart  Reviewed/Updated  Reviewed/Updated: Reviewed All (Medical, Surgical, Family, Medications, Allergies, Care Teams, Patient Goals)    Allergies (verified) Doxycycline calcium , Tussionex pennkinetic er [hydrocod poli-chlorphe poli er], and Vytorin [ezetimibe-simvastatin]   Current Medications (verified) Outpatient Encounter Medications as of 04/01/2024  Medication Sig   acetaminophen  (TYLENOL ) 500 MG tablet Take 1,000 mg by mouth every 6 (six) hours as needed.   Alcohol Swabs  (B-D SINGLE USE SWABS REGULAR) PADS 1 Application by Does not apply route in the morning, at noon, and at bedtime.   allopurinol  (ZYLOPRIM ) 300 MG tablet Take 1 tablet (300 mg total) by mouth daily.   amLODipine  (NORVASC ) 10 MG tablet TAKE 1 TABLET EVERY DAY   Blood Glucose Calibration (TRUE METRIX LEVEL 1) Low SOLN 1 Application by In Vitro route once a week.   cholecalciferol  25 MCG (1000 UT) tablet TAKE 2 TABLETS EVERY DAY   cyanocobalamin  (VITAMIN B12) 1000 MCG tablet Take 1 tablet (1,000 mcg total) by mouth daily.   empagliflozin  (JARDIANCE ) 10 MG TABS tablet Take 1 tablet (10 mg total) by mouth daily before breakfast.   glipiZIDE  (GLUCOTROL ) 5 MG tablet TAKE 1 TABLET EVERY DAY BEFORE BREAKFAST   glucose blood (TRUE METRIX BLOOD GLUCOSE TEST) test strip Use as instructed   losartan  (COZAAR ) 100 MG tablet TAKE 1 TABLET EVERY DAY   meloxicam  (MOBIC ) 15 MG tablet Take 1 tablet (15 mg total) by mouth daily.   metFORMIN  (GLUCOPHAGE ) 500 MG tablet TAKE 2 TABLETS TWICE DAILY WITH MEALS   pantoprazole  (PROTONIX ) 40 MG tablet TAKE 1 TABLET EVERY DAY   rosuvastatin  (CRESTOR ) 40 MG tablet TAKE 1 TABLET EVERY DAY   tamsulosin  (FLOMAX ) 0.4 MG CAPS capsule TAKE 1 CAPSULE EVERY DAY   albuterol  (VENTOLIN  HFA) 108 (90 Base) MCG/ACT inhaler Inhale 2 puffs into the lungs every 6 (six) hours as needed for wheezing or shortness of breath. (Patient not taking: Reported on 04/01/2024)   Albuterol -Budesonide (AIRSUPRA ) 90-80 MCG/ACT AERO Inhale 2 puffs into the lungs every 6 (six) hours as needed. (Patient not taking: Reported on 04/01/2024)   Blood Glucose Calibration (TRUE METRIX LEVEL 3) High SOLN 1 Application by In Vitro route once a week.   fluticasone -salmeterol (ADVAIR) 100-50 MCG/ACT AEPB Inhale 1 puff into the lungs 2 (two) times daily. (Patient not taking: Reported on 04/01/2024)   Spacer/Aero-Holding Chambers (AEROCHAMBER PLUS WITH MASK) inhaler Use as instructed (Patient not taking: Reported on  04/01/2024)   No facility-administered encounter medications on file as of 04/01/2024.    History: Past Medical History:  Diagnosis Date   Bleeding ulcer    Bronchospasm    Diabetes mellitus without complication (HCC)    Type 2   Diverticulosis    Pure hypercholesterolemia    Traumatic amputation of leg above knee [facility])    Past Surgical History:  Procedure Laterality Date   ABOVE KNEE  LEG AMPUTATION  04/10/1969   APPENDECTOMY     KNEE SURGERY     x 2.   REPLACEMENT TOTAL KNEE Left    Family History  Problem Relation Age of Onset   Cancer Mother        Colon cancer   Lung disease Father    Social History   Occupational History   Occupation: Retired  Tobacco Use   Smoking status: Former    Current packs/day: 0.00    Types: Cigarettes    Quit date: 09/29/1964    Years since quitting: 59.5   Smokeless tobacco: Never  Vaping Use   Vaping status: Never Used  Substance and Sexual Activity   Alcohol use: No    Alcohol/week: 0.0 standard drinks of alcohol   Drug use: No   Sexual activity: Not Currently   Tobacco Counseling Counseling given: Not Answered  SDOH Screenings   Food Insecurity: No Food Insecurity (04/01/2024)  Housing: Low Risk (04/01/2024)  Transportation Needs: No Transportation Needs (04/01/2024)  Utilities: Not At Risk (04/01/2024)  Alcohol Screen: Low Risk (08/03/2021)  Depression (PHQ2-9): Medium Risk (04/01/2024)  Financial Resource Strain: Low Risk (08/03/2021)  Physical Activity: Inactive (04/01/2024)  Social Connections: Moderately Isolated (04/01/2024)  Stress: No Stress Concern Present (04/01/2024)  Tobacco Use: Medium Risk (04/01/2024)  Health Literacy: Inadequate Health Literacy (04/01/2024)   See flowsheets for full screening details  Depression Screen PHQ 2 & 9 Depression Scale- Over the past 2 weeks, how often have you been bothered by any of the following problems? Little interest or pleasure in doing things: 0 Feeling down,  depressed, or hopeless (PHQ Adolescent also includes...irritable): 3 PHQ-2 Total Score: 3 Trouble falling or staying asleep, or sleeping too much: 0 Feeling tired or having little energy: 3 Poor appetite or overeating (PHQ Adolescent also includes...weight loss): 0 Feeling bad about yourself - or that you are a failure or have let yourself or your family down: 0 Trouble concentrating on things, such as reading the newspaper or watching television (PHQ Adolescent also includes...like school work): 0 Moving or speaking so slowly that other people could have noticed. Or the opposite - being so fidgety or restless that you have been moving around a lot more than usual: 0 Thoughts that you would be better off dead, or of hurting yourself in some way: 0 PHQ-9 Total Score: 6 If you checked off any problems, how difficult have these problems made it for you to do your work, take care of things at home, or get along with other people?: Somewhat difficult  Depression Treatment Depression Interventions/Treatment : Patient refuses Treatment     Goals Addressed               This Visit's Progress     DIET - EAT MORE FRUITS AND VEGETABLES (pt-stated)               Objective:    Today's Vitals   04/01/24 1524  BP: 110/62  Weight: 159 lb 12.8 oz (72.5 kg)  Height: 5' 7 (1.702 m)   Body mass index is 25.03 kg/m.  Hearing/Vision screen Hearing Screening - Comments:: Has hearing aids, but doesn't wear them Vision Screening - Comments:: Needs DM eye exam. Patient to call and schedule at [facility] Immunizations and Health Maintenance Health Maintenance  Topic Date Due   OPHTHALMOLOGY EXAM  02/17/2023   COVID-19 Vaccine (6 - 2025-26 season) 12/10/2023   FOOT EXAM  03/27/2024   Influenza Vaccine  07/08/2024 (  Originally 11/09/2023)   HEMOGLOBIN A1C  06/07/2024   Diabetic kidney evaluation - eGFR measurement  12/06/2024   Diabetic kidney evaluation - Urine ACR  02/25/2025    Medicare Annual Wellness (AWV)  04/01/2025   DTaP/Tdap/Td (3 - Td or Tdap) 10/18/2025   Pneumococcal Vaccine: 50+ Years  Completed   Meningococcal B Vaccine  Aged Out   Colonoscopy  Discontinued   Hepatitis C Screening  Discontinued   Zoster Vaccines- Shingrix  Discontinued        Assessment/Plan:  This is a routine wellness examination for Jostin.  Patient Care Team: Melvin Pao, NP as PCP - General Darliss Rogue, MD as PCP - Cardiology ([facility]iology[facility], [facility])  I have personally reviewed and noted the following in the patients chart:   Medical and social history Use of alcohol, tobacco or illicit drugs  Current medications and supplements including opioid prescriptions. Functional ability and status Nutritional status Physical activity Advanced directives List of other physicians Hospitalizations, surgeries, and ER visits in previous 12 months Vitals Screenings to include cognitive, depression, and falls Referrals and appointments  No orders of the defined types were placed in this encounter.  In addition, I have reviewed and discussed with patient certain preventive protocols, quality metrics, and best practice recommendations. A written personalized care plan for preventive services as well as general preventive health recommendations were provided to patient.   Vina Ned, CMA   04/01/2024   Return in 1 year (on 04/09/2025) for Medicare Annual Wellness Visit.  After Visit Summary: (In Person-Printed) AVS printed and given to the patient  Nurse Notes:  6 CIT Score - 16 Tracy, care giver was present during today's visit RS cancelled appt from 03/11/24 to 04/07/24 FBS this morning per patient was 176 Needs DM eye exam. Patient to call and schedule Needs DM foot exam at next OV on 04/07/24 Declined flu, shingles and covid vaccines Declined DM & Nutrition education referral "

## 2024-04-01 NOTE — Patient Instructions (Signed)
 Carl Bryan,  Thank you for taking the time for your Medicare Wellness Visit. I appreciate your continued commitment to your health goals. Please review the care plan we discussed, and feel free to reach out if I can assist you further.  Please note that Annual Wellness Visits do not include a physical exam. Some assessments may be limited, especially if the visit was conducted virtually. If needed, we may recommend an in-person follow-up with your provider.  Ongoing Care Seeing your primary care provider every 3 to 6 months helps us  monitor your health and provide consistent, personalized care. I have scheduled you an appointment with Darice Petty, NP for 04/07/24 at 2:20pm.  Referrals If a referral was made during today's visit and you haven't received any updates within two weeks, please contact the referred provider directly to check on the status.  Recommended Screenings:  Call to schedule a diabetic eye exam at your earliest convenience.    Health Maintenance  Topic Date Due   Eye exam for diabetics  02/17/2023   Medicare Annual Wellness Visit  09/26/2023   Flu Shot  11/09/2023   COVID-19 Vaccine (6 - 2025-26 season) 12/10/2023   Complete foot exam   03/27/2024   Hemoglobin A1C  06/07/2024   Yearly kidney function blood test for diabetes  12/06/2024   Yearly kidney health urinalysis for diabetes  02/25/2025   DTaP/Tdap/Td vaccine (3 - Td or Tdap) 10/18/2025   Pneumococcal Vaccine for age over 63  Completed   Meningitis B Vaccine  Aged Out   Colon Cancer Screening  Discontinued   Hepatitis C Screening  Discontinued   Zoster (Shingles) Vaccine  Discontinued       04/01/2024    3:32 PM  Advanced Directives  Does Patient Have a Medical Advance Directive? Yes  Type of Advance Directive Out of facility DNR (pink MOST or yellow form)  Does patient want to make changes to medical advance directive? No - Patient declined    Vision: Annual vision screenings are recommended  for early detection of glaucoma, cataracts, and diabetic retinopathy. These exams can also reveal signs of chronic conditions such as diabetes and high blood pressure.  Dental: Annual dental screenings help detect early signs of oral cancer, gum disease, and other conditions linked to overall health, including heart disease and diabetes.  Please see the attached documents for additional preventive care recommendations.

## 2024-04-07 ENCOUNTER — Ambulatory Visit: Admitting: Nurse Practitioner

## 2024-04-07 ENCOUNTER — Encounter: Payer: Self-pay | Admitting: Nurse Practitioner

## 2024-04-07 VITALS — BP 107/58 | HR 89 | Temp 97.7°F

## 2024-04-07 DIAGNOSIS — I152 Hypertension secondary to endocrine disorders: Secondary | ICD-10-CM | POA: Diagnosis not present

## 2024-04-07 DIAGNOSIS — E785 Hyperlipidemia, unspecified: Secondary | ICD-10-CM | POA: Diagnosis not present

## 2024-04-07 DIAGNOSIS — E1169 Type 2 diabetes mellitus with other specified complication: Secondary | ICD-10-CM

## 2024-04-07 DIAGNOSIS — B351 Tinea unguium: Secondary | ICD-10-CM

## 2024-04-07 DIAGNOSIS — E1159 Type 2 diabetes mellitus with other circulatory complications: Secondary | ICD-10-CM | POA: Diagnosis not present

## 2024-04-07 DIAGNOSIS — R0602 Shortness of breath: Secondary | ICD-10-CM | POA: Diagnosis not present

## 2024-04-07 MED ORDER — METHYLPREDNISOLONE 4 MG PO TBPK: ORAL_TABLET | ORAL | 0 refills | Status: AC

## 2024-04-07 MED ORDER — FLUTICASONE-SALMETEROL 100-50 MCG/ACT IN AEPB: 1.0000 | INHALATION_SPRAY | Freq: Two times a day (BID) | RESPIRATORY_TRACT | 3 refills | Status: DC

## 2024-04-07 MED ORDER — ALBUTEROL SULFATE HFA 108 (90 BASE) MCG/ACT IN AERS: 2.0000 | INHALATION_SPRAY | Freq: Four times a day (QID) | RESPIRATORY_TRACT | 3 refills | Status: DC | PRN

## 2024-04-07 MED ORDER — LANCETS MISC: 1.0000 | Freq: Every day | 0 refills | Status: AC

## 2024-04-07 NOTE — Assessment & Plan Note (Signed)
 Chronic. Patient has not been taking his inhalers. A friends of the family has taken over helping the patient and is going to ensure he take his inhalers daily.  Will treat with short course of prednisone  due to decreased breath sounds.  Follow up if not improved.

## 2024-04-07 NOTE — Assessment & Plan Note (Signed)
 Chronic, controlled. Appears controlled on current regimen comprised of Losartan  100 mg PO every day, Amlodipine  10 mg PO every day and appears to be tolerating well Does not need refills today. Continue current regimen Recommend checking BP daily and recording for monitoring at home  Follow up in 3 months or sooner if concerns arise

## 2024-04-07 NOTE — Assessment & Plan Note (Signed)
 Chronic, controlled. Appears well managed on current regimen comprised of Rosuvastatin  40 mg PO every day  Continue current regimen Labs ordered at visit today. Follow up in 3 months or sooner if concerns arise

## 2024-04-07 NOTE — Progress Notes (Signed)
 "  BP (!) 107/58   Pulse 89   Temp 97.7 F (36.5 C) (Oral)    Subjective:    Patient ID: Carl Bryan, male    DOB: 1945-05-28, 78 y.o.   MRN: [id number]  HPI: Carl Bryan is a 78 y.o. male  Chief Complaint  Patient presents with   Diabetes   Hypertension   Hyperlipidemia   HYPERTENSION / HYPERLIPIDEMIA Satisfied with current treatment? yes Duration of hypertension: years BP monitoring frequency: daily BP range: doesn't remember BP medication side effects: no Past BP meds: amlodipine  and losartan  (cozaar ) Duration of hyperlipidemia: years Cholesterol medication side effects: no Cholesterol supplements: none Past cholesterol medications: rosuvastatin  (crestor ) Medication compliance: excellent compliance Aspirin: no Recent stressors: no Recurrent headaches: no Visual changes: no Palpitations: no Dyspnea: no Chest pain: no Lower extremity edema: no Dizzy/lightheaded: no  DIABETES Denies concerns during visit today.  Takes metformin  and glipizide , Jardiance .  Tolerating medications well.  States that he does have diarrhea about 2pm in the afternoon and loses control of his bowels.  Hypoglycemic episodes:no Polydipsia/polyuria: no Visual disturbance: no Chest pain: no Paresthesias: no Glucose Monitoring: yes  Accucheck frequency: Daily;   Fasting glucose: 72  Post prandial:  Evening:  Before meals: Taking Insulin ?: no  Long acting insulin :  Short acting insulin : Blood Pressure Monitoring: daily Retinal Examination: Done today Foot Exam: up to date Diabetic Education: Completed today Pneumovax: Up to Date Influenza: Completed today Aspirin: no  Patient has been sick for about 5 weeks.  Patient now has a caregiver.  States he has been wheezing. He has a hole in the top of his toenail about the size of a pen head.     Relevant past medical, surgical, family and social history reviewed and updated as indicated. Interim medical history since our last visit  reviewed. Allergies and medications reviewed and updated.  Review of Systems  Constitutional:  Negative for activity change, appetite change, chills, fever and unexpected weight change.  Eyes:  Negative for visual disturbance.  Respiratory:  Positive for wheezing. Negative for cough, chest tightness and shortness of breath.   Cardiovascular:  Negative for chest pain, palpitations and leg swelling.  Gastrointestinal:  Positive for diarrhea.  Endocrine: Negative for polydipsia and polyuria.  Musculoskeletal:  Negative for joint swelling.  Skin:        Toenail problem  Neurological:  Negative for dizziness, light-headedness, numbness and headaches.  Psychiatric/Behavioral:  Negative for sleep disturbance and suicidal ideas. The patient is not nervous/anxious.     Per HPI unless specifically indicated above     Objective:    BP (!) 107/58   Pulse 89   Temp 97.7 F (36.5 C) (Oral)   Wt Readings from Last 3 Encounters:  04/01/24 159 lb 12.8 oz (72.5 kg)  01/02/24 158 lb 12.8 oz (72 kg)  12/07/23 157 lb (71.2 kg)    Physical Exam Vitals and nursing note reviewed.  Constitutional:      General: He is not in acute distress.    Appearance: Normal appearance. He is not ill-appearing, toxic-appearing or diaphoretic.  HENT:     Nose: No congestion or rhinorrhea.  Eyes:     General:        Right eye: No discharge.        Left eye: No discharge.     Pupils: Pupils are equal, round, and reactive to light.  Cardiovascular:     Rate and Rhythm: Normal rate. Rhythm irregular.  Pulses:          Dorsalis pedis pulses are 1+ on the left side.       Posterior tibial pulses are 1+ on the left side.     Heart sounds: No murmur heard. Pulmonary:     Effort: Pulmonary effort is normal. No respiratory distress.     Breath sounds: Normal breath sounds. Decreased air movement present. No wheezing, rhonchi or rales.  Musculoskeletal:     Cervical back: Normal range of motion and neck supple.      Comments: Uses crutches due to R AKA.     Right Lower Extremity: Right leg is amputated above knee.  Feet:     Left foot:     Protective Sensation: 10 sites tested.  10 sites sensed.     Skin integrity: Dry skin present. No skin breakdown or warmth.     Toenail Condition: Left toenails are abnormally thick. Fungal disease present. Skin:    General: Skin is warm and dry.     Capillary Refill: Capillary refill takes less than 2 seconds.  Neurological:     General: No focal deficit present.     Mental Status: He is alert and oriented to person, place, and time.  Psychiatric:        Mood and Affect: Mood normal.        Behavior: Behavior normal.        Thought Content: Thought content normal.        Judgment: Judgment normal.     Results for orders placed or performed in visit on 03/26/24  Microalbumin / creatinine urine ratio   Collection Time: 02/26/24 11:34 AM  Result Value Ref Range   Microalb Creat Ratio 250       Assessment & Plan:   Problem List Items Addressed This Visit       Cardiovascular and Mediastinum   Hypertension associated with diabetes (HCC) - Primary   Chronic, controlled. Appears controlled on current regimen comprised of Losartan  100 mg PO every day, Amlodipine  10 mg PO every day and appears to be tolerating well Does not need refills today. Continue current regimen Recommend checking BP daily and recording for monitoring at home  Follow up in 3 months or sooner if concerns arise       Relevant Orders   Comp Met (CMET)     Endocrine   Hyperlipidemia associated with type 2 diabetes mellitus (HCC)   Chronic, controlled. Appears well managed on current regimen comprised of Rosuvastatin  40 mg PO every day  Continue current regimen Labs ordered at visit today. Follow up in 3 months or sooner if concerns arise       Relevant Orders   Lipid Profile   Diabetes mellitus associated with hormonal etiology (HCC)   Chronic.  Last A1c was 6.2%.  Will  check labs at visit today. Stop taking Metformin  due to diarrhea.  Continue with Jardiance .  Continue with current medication regimen.  Labs ordered today.  Return to clinic in 3 months for reevaluation.  Call sooner if concerns arise.       Relevant Orders   HgB A1c     Other   SOB (shortness of breath) on exertion   Chronic. Patient has not been taking his inhalers. A friends of the family has taken over helping the patient and is going to ensure he take his inhalers daily.  Will treat with short course of prednisone  due to decreased breath sounds.  Follow up if  not improved.       Relevant Medications   fluticasone -salmeterol (ADVAIR) 100-50 MCG/ACT AEPB   Other Visit Diagnoses       Toenail fungus       Needs to see podiatry for thick toenails.  Referral placed today.   Relevant Orders   Ambulatory referral to Podiatry        Follow up plan: Return in about 3 months (around 07/06/2024) for HTN, HLD, DM2 FU.       "

## 2024-04-07 NOTE — Assessment & Plan Note (Signed)
 Chronic.  Last A1c was 6.2%.  Will check labs at visit today. Stop taking Metformin  due to diarrhea.  Continue with Jardiance .  Continue with current medication regimen.  Labs ordered today.  Return to clinic in 3 months for reevaluation.  Call sooner if concerns arise.

## 2024-04-08 ENCOUNTER — Ambulatory Visit: Payer: Self-pay | Admitting: Nurse Practitioner

## 2024-04-08 DIAGNOSIS — I152 Hypertension secondary to endocrine disorders: Secondary | ICD-10-CM

## 2024-04-08 LAB — COMPREHENSIVE METABOLIC PANEL WITH GFR
ALT: 15 IU/L (ref 0–44)
AST: 17 IU/L (ref 0–40)
Albumin: 4.4 g/dL (ref 3.8–4.8)
Alkaline Phosphatase: 57 IU/L (ref 47–123)
BUN/Creatinine Ratio: 12 (ref 10–24)
BUN: 21 mg/dL (ref 8–27)
Bilirubin Total: 0.7 mg/dL (ref 0.0–1.2)
CO2: 19 mmol/L — ABNORMAL LOW (ref 20–29)
Calcium: 9.7 mg/dL (ref 8.6–10.2)
Chloride: 102 mmol/L (ref 96–106)
Creatinine, Ser: 1.7 mg/dL — ABNORMAL HIGH (ref 0.76–1.27)
Globulin, Total: 2.2 g/dL (ref 1.5–4.5)
Glucose: 152 mg/dL — ABNORMAL HIGH (ref 70–99)
Potassium: 3.7 mmol/L (ref 3.5–5.2)
Sodium: 139 mmol/L (ref 134–144)
Total Protein: 6.6 g/dL (ref 6.0–8.5)
eGFR: 40 mL/min/1.73 — ABNORMAL LOW

## 2024-04-08 LAB — HEMOGLOBIN A1C
Est. average glucose Bld gHb Est-mCnc: 143 mg/dL
Hgb A1c MFr Bld: 6.6 % — ABNORMAL HIGH (ref 4.8–5.6)

## 2024-04-08 LAB — LIPID PANEL
Chol/HDL Ratio: 2.9 ratio (ref 0.0–5.0)
Cholesterol, Total: 109 mg/dL (ref 100–199)
HDL: 37 mg/dL — ABNORMAL LOW
LDL Chol Calc (NIH): 48 mg/dL (ref 0–99)
Triglycerides: 139 mg/dL (ref 0–149)
VLDL Cholesterol Cal: 24 mg/dL (ref 5–40)

## 2024-04-16 ENCOUNTER — Ambulatory Visit: Admitting: Podiatry

## 2024-04-16 ENCOUNTER — Other Ambulatory Visit

## 2024-04-16 VITALS — Ht 67.0 in | Wt 159.8 lb

## 2024-04-16 DIAGNOSIS — B351 Tinea unguium: Secondary | ICD-10-CM

## 2024-04-16 DIAGNOSIS — M79675 Pain in left toe(s): Secondary | ICD-10-CM

## 2024-04-16 DIAGNOSIS — B353 Tinea pedis: Secondary | ICD-10-CM | POA: Diagnosis not present

## 2024-04-16 DIAGNOSIS — I152 Hypertension secondary to endocrine disorders: Secondary | ICD-10-CM

## 2024-04-16 MED ORDER — KETOCONAZOLE 2 % EX CREA: 1.0000 | TOPICAL_CREAM | Freq: Every day | CUTANEOUS | 2 refills | Status: DC
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
## 2024-04-17 ENCOUNTER — Ambulatory Visit: Payer: Self-pay | Admitting: Nurse Practitioner

## 2024-04-17 DIAGNOSIS — N1831 Chronic kidney disease, stage 3a: Secondary | ICD-10-CM

## 2024-04-17 LAB — COMPREHENSIVE METABOLIC PANEL WITH GFR
ALT: 27 IU/L (ref 0–44)
AST: 11 IU/L (ref 0–40)
Albumin: 3.8 g/dL (ref 3.8–4.8)
Alkaline Phosphatase: 67 IU/L (ref 47–123)
BUN/Creatinine Ratio: 14 (ref 10–24)
BUN: 24 mg/dL (ref 8–27)
Bilirubin Total: 0.6 mg/dL (ref 0.0–1.2)
CO2: 17 mmol/L — ABNORMAL LOW (ref 20–29)
Calcium: 9.2 mg/dL (ref 8.6–10.2)
Chloride: 102 mmol/L (ref 96–106)
Creatinine, Ser: 1.73 mg/dL — ABNORMAL HIGH (ref 0.76–1.27)
Globulin, Total: 2.2 g/dL (ref 1.5–4.5)
Glucose: 226 mg/dL — ABNORMAL HIGH (ref 70–99)
Potassium: 4.1 mmol/L (ref 3.5–5.2)
Sodium: 137 mmol/L (ref 134–144)
Total Protein: 6 g/dL (ref 6.0–8.5)
eGFR: 39 mL/min/1.73 — ABNORMAL LOW

## 2024-05-13 ENCOUNTER — Other Ambulatory Visit: Payer: Self-pay | Admitting: Nurse Practitioner

## 2024-05-13 DIAGNOSIS — R0602 Shortness of breath: Secondary | ICD-10-CM

## 2024-05-13 NOTE — Telephone Encounter (Unsigned)
 Copied from CRM #[id number]. Topic: Clinical - Medication Question >> May 13, 2024  4:26 PM Myrick T wrote: Reason for CRM: Dwayne Loge patients friend called stated the medication ketoconazole  (NIZORAL ) 2 % cream, allopurinol  (ZYLOPRIM ) 300 MG tablet, pantoprazole  (PROTONIX ) 40 MG tablet, albuterol  (VENTOLIN  HFA) 108 (90 Base) MCG/ACT inhaler and fluticasone -salmeterol (ADVAIR) 100-50 MCG/ACT AEPB should all be sent to [facility]. Dwayne did not have the information for the pharmacy but says all his medication needs to be sent to [facility] from now on as his insurance changed to [place] in January. She also said the number she called was [phone]. Dwayne is requesting a call to let her know this has been corrected as patient is out of all his medication.

## 2024-05-14 ENCOUNTER — Encounter: Payer: Self-pay | Admitting: Nurse Practitioner

## 2024-05-15 MED ORDER — ALLOPURINOL 300 MG PO TABS: 300.0000 mg | ORAL_TABLET | Freq: Every day | ORAL | 1 refills | Status: AC

## 2024-05-15 MED ORDER — ALBUTEROL SULFATE HFA 108 (90 BASE) MCG/ACT IN AERS: 2.0000 | INHALATION_SPRAY | Freq: Four times a day (QID) | RESPIRATORY_TRACT | 2 refills | Status: AC | PRN

## 2024-05-15 MED ORDER — OMEPRAZOLE 20 MG PO CPDR: 20.0000 mg | DELAYED_RELEASE_CAPSULE | Freq: Every day | ORAL | 1 refills | Status: AC

## 2024-05-15 MED ORDER — ALLOPURINOL 300 MG PO TABS: 300.0000 mg | ORAL_TABLET | Freq: Every day | ORAL | 0 refills | Status: AC

## 2024-05-15 MED ORDER — KETOCONAZOLE 2 % EX CREA: 1.0000 | TOPICAL_CREAM | Freq: Every day | CUTANEOUS | 2 refills | Status: AC

## 2024-05-15 MED ORDER — FLUTICASONE-SALMETEROL 100-50 MCG/ACT IN AEPB: 1.0000 | INHALATION_SPRAY | Freq: Two times a day (BID) | RESPIRATORY_TRACT | 2 refills | Status: AC

## 2024-05-15 NOTE — Telephone Encounter (Signed)
 Requested Prescriptions  Pending Prescriptions Disp Refills   ketoconazole  (NIZORAL ) 2 % cream 60 g 2    Sig: Apply 1 Application topically daily.     Not Delegated - Over the Counter: OTC 2 Failed - 05/15/2024 12:27 PM      Failed - This refill cannot be delegated      Passed - Valid encounter within last 12 months    Recent Outpatient Visits           1 month ago Hypertension associated with diabetes [facility])   [place] [facility] Melvin Pao, NP   4 months ago Encounter for completion of form with patient   [place] [facility] Melvin Pao, NP   5 months ago Hypertension associated with diabetes [facility])   [place] [facility]vin Pao, NP   8 months ago Hypertension associated with diabetes [facility])   [place] [facility] Melvin Pao, NP               allopurinol  (ZYLOPRIM ) 300 MG tablet 90 tablet 0    Sig: Take 1 tablet (300 mg total) by mouth daily.     Endocrinology:  Gout Agents - allopurinol  Failed - 05/15/2024 12:27 PM      Failed - Uric Acid in normal range and within 360 days    Uric Acid  Date Value Ref Range Status  07/28/2020 2.4 (L) 3.8 - 8.4 mg/dL Final    Comment:               Therapeutic target for gout patients: <6.0         Failed - Cr in normal range and within 360 days    Creatinine, Ser  Date Value Ref Range Status  04/16/2024 1.73 (H) 0.76 - 1.27 mg/dL Final   Creatinine, Urine  Date Value Ref Range Status  03/05/2019 82 mg/dL Final    Comment:    Performed at [facility], [address] [place], [place] [address]         Failed - CBC within normal limits and completed in the last 12 months    WBC  Date Value Ref Range Status  11/28/2022 8.4 3.4 - 10.8 x10E3/uL Final  03/10/2019 6.4 4.0 - 10.5 K/uL Final   RBC  Date Value Ref Range Status  11/28/2022 4.54 4.14 - 5.80 x10E6/uL Final  03/10/2019 4.14 (L) 4.22 - 5.81 MIL/uL Final    Hemoglobin  Date Value Ref Range Status  11/28/2022 13.1 13.0 - 17.7 g/dL Final   Hematocrit  Date Value Ref Range Status  11/28/2022 39.5 37.5 - 51.0 % Final   MCHC  Date Value Ref Range Status  11/28/2022 33.2 31.5 - 35.7 g/dL Final  88/69/7979 65.3 30.0 - 36.0 g/dL Final   [facility]  Date Value Ref Range Status  11/28/2022 28.9 26.6 - 33.0 pg Final  03/10/2019 31.4 26.0 - 34.0 pg Final   MCV  Date Value Ref Range Status  11/28/2022 87 79 - 97 fL Final   No results found for: PLTCOUNTKUC, LABPLAT, POCPLA RDW  Date Value Ref Range Status  11/28/2022 14.5 11.6 - 15.4 % Final         Passed - Valid encounter within last 12 months    Recent Outpatient Visits           1 month ago Hypertension associated with diabetes [facility])   [place] [facility] Melvin Pao, NP   4 months ago  Encounter for completion of form with patient   [place] [facility] Melvin Pao, NP   5 months ago Hypertension associated with diabetes [facility])   [place] [facility] Melvin Pao, NP   8 months ago Hypertension associated with diabetes [facility])   [place] [facility] Melvin Pao, NP              Refused Prescriptions Disp Refills   pantoprazole  (PROTONIX ) 40 MG tablet 90 tablet 1    Sig: Take 1 tablet (40 mg total) by mouth daily.     Gastroenterology: Proton Pump Inhibitors Passed - 05/15/2024 12:27 PM      Passed - Valid encounter within last 12 months    Recent Outpatient Visits           1 month ago Hypertension associated with diabetes [facility])   [place] [facility] Melvin Pao, NP   4 months ago Encounter for completion of form with patient   [place] [facility] Melvin Pao, NP   5 months ago Hypertension associated with diabetes [facility])   [place] [facility] Melvin Pao, NP   8 months ago Hypertension associated with diabetes  [facility])   [place] [facility] Melvin Pao, NP               albuterol  (VENTOLIN  HFA) 108 (90 Base) MCG/ACT inhaler 18 g 3    Sig: Inhale 2 puffs into the lungs every 6 (six) hours as needed for wheezing or shortness of breath.     Pulmonology:  Beta Agonists 2 Passed - 05/15/2024 12:27 PM      Passed - Last BP in normal range    BP Readings from Last 1 Encounters:  04/07/24 (!) 107/58         Passed - Last Heart Rate in normal range    Pulse Readings from Last 1 Encounters:  04/07/24 89         Passed - Valid encounter within last 12 months    Recent Outpatient Visits           1 month ago Hypertension associated with diabetes [facility])   [place] [facility] Melvin Pao, NP   4 months ago Encounter for completion of form with patient   [place] [facility] Melvin Pao, NP   5 months ago Hypertension associated with diabetes [facility])   [place] [facility] Melvin Pao, NP   8 months ago Hypertension associated with diabetes [facility])   [place] [facility] Melvin Pao, NP               fluticasone -salmeterol (ADVAIR) 100-50 MCG/ACT AEPB 1 each 3    Sig: Inhale 1 puff into the lungs 2 (two) times daily.     Pulmonology:  Combination Products Passed - 05/15/2024 12:27 PM      Passed - Valid encounter within last 12 months    Recent Outpatient Visits           1 month ago Hypertension associated with diabetes [facility])   [place] [facility] Melvin Pao, NP   4 months ago Encounter for completion of form with patient   [place] [facility] Melvin Pao, NP   5 months ago Hypertension associated with diabetes [facility])   [place] [facility] Melvin Pao, NP   8 months ago Hypertension associated with diabetes [facility])   [place] [facility] Melvin Pao, NP

## 2024-05-15 NOTE — Telephone Encounter (Signed)
 Requested by patient . Medication discontinued 05/15/24. Future visit 07/08/24.  Requested Prescriptions  Refused Prescriptions Disp Refills   pantoprazole  (PROTONIX ) 40 MG tablet 90 tablet 1    Sig: Take 1 tablet (40 mg total) by mouth daily.     Gastroenterology: Proton Pump Inhibitors Passed - 05/15/2024 11:44 AM      Passed - Valid encounter within last 12 months    Recent Outpatient Visits           1 month ago Hypertension associated with diabetes [facility])   [place] [facility] Melvin Pao, NP   4 months ago Encounter for completion of form with patient   [place] [facility] Melvin Pao, NP   5 months ago Hypertension associated with diabetes [facility])   [place] [facility] Melvin Pao, NP   8 months ago Hypertension associated with diabetes [facility])   [place] [facility] Melvin Pao, NP

## 2024-05-15 NOTE — Telephone Encounter (Signed)
 Requested medication (s) are due for refill today: na   Requested medication (s) are on the active medication list: yes   Last refill:  nizorel- 04/16/24 #60 g 2 refills, allopurinol -02/02/24 #90 1 refill  Future visit scheduled: yes 07/08/24  Notes to clinic:  nizoral  last ordered by Juliene Johnita Golas, DPM. Do you want to refill Rx? Do you want to refill allopurinol  Rx? See allergy list     Requested Prescriptions  Pending Prescriptions Disp Refills   ketoconazole  (NIZORAL ) 2 % cream 60 g 2    Sig: Apply 1 Application topically daily.     Not Delegated - Over the Counter: OTC 2 Failed - 05/15/2024 11:38 AM      Failed - This refill cannot be delegated      Passed - Valid encounter within last 12 months    Recent Outpatient Visits           1 month ago Hypertension associated with diabetes [facility])   [place] [facility]vin [facility]o, NP   4 months ago Encounter for completion of form with patient   [place] [facility], NP   5 months ago Hypertension associated with diabetes [facility])   [place] [facility] Pao, NP   8 months ago Hypertension associated with diabetes [facility])   [place] [facility]vin Pao, NP               allopurinol  (ZYLOPRIM ) 300 MG tablet 90 tablet 1    Sig: Take 1 tablet (300 mg total) by mouth daily.     Endocrinology:  Gout Agents - allopurinol  Failed - 05/15/2024 11:38 AM      Failed - Uric Acid in normal range and within 360 days    Uric Acid  Date Value Ref Range Status  07/28/2020 2.4 (L) 3.8 - 8.4 mg/dL Final    Comment:               Therapeutic target for gout patients: <6.0         Failed - Cr in normal range and within 360 days    Creatinine, Ser  Date Value Ref Range Status  04/16/2024 1.73 (H) 0.76 - 1.27 mg/dL Final   Creatinine, Urine  Date Value Ref Range Status  03/05/2019 82 mg/dL Final    Comment:    Performed at [facility], [address] [place], [place] [address]         Failed - CBC within normal limits and completed in the last 12 months    WBC  Date Value Ref Range Status  11/28/2022 8.4 3.4 - 10.8 x10E3/uL Final  03/10/2019 6.4 4.0 - 10.5 K/uL Final   RBC  Date Value Ref Range Status  11/28/2022 4.54 4.14 - 5.80 x10E6/uL Final  03/10/2019 4.14 (L) 4.22 - 5.81 MIL/uL Final   Hemoglobin  Date Value Ref Range Status  11/28/2022 13.1 13.0 - 17.7 g/dL Final   Hematocrit  Date Value Ref Range Status  11/28/2022 39.5 37.5 - 51.0 % Final   MCHC  Date Value Ref Range Status  11/28/2022 33.2 31.5 - 35.7 g/dL Final  88/69/7979 65.3 30.0 - 36.0 g/dL Final   [facility]  Date Value Ref Range Status  11/28/2022 28.9 26.6 - 33.0 pg Final  03/10/2019 31.4 26.0 - 34.0 pg Final   MCV  Date Value Ref Range Status  11/28/2022 87 79 - 97 fL Final   No results  found for: PLTCOUNTKUC, LABPLAT, POCPLA RDW  Date Value Ref Range Status  11/28/2022 14.5 11.6 - 15.4 % Final         Passed - Valid encounter within last 12 months    Recent Outpatient Visits           1 month ago Hypertension associated with diabetes [facility])   [place] [facility]   4 months ago Encounter for completion of form with patient   [place] [facility], NP   5 months ago Hypertension associated with diabetes [facility])   [place] [facility] Melvin Pao, NP   8 months ago Hypertension associated with diabetes [facility])   [place] [facility] Melvin Pao, NP              Signed Prescriptions Disp Refills   albuterol  (VENTOLIN  HFA) 108 (90 Base) MCG/ACT inhaler 18 g 2    Sig: Inhale 2 puffs into the lungs every 6 (six) hours as needed for wheezing or shortness of breath.     Pulmonology:  Beta Agonists 2 Passed - 05/15/2024 11:38 AM      Passed - Last BP in normal range    BP Readings from Last 1  Encounters:  04/07/24 (!) 107/58         Passed - Last Heart Rate in normal range    Pulse Readings from Last 1 Encounters:  04/07/24 89         Passed - Valid encounter within last 12 months    Recent Outpatient Visits           1 month ago Hypertension associated with diabetes [facility])   [place] [facility], NP   4 months ago Encounter for completion of form with patient   [place] [facility] Pao, NP   5 months ago Hypertension associated with diabetes [facility])   [place] [facility] Mel[facility] Pao, NP   8 months ago Hypertension associated with diabetes [facility])   [place] [facility] Melvin Pao, NP               fluticasone -salmeterol (ADVAIR) 100-50 MCG/ACT AEPB 1 each 2    Sig: Inhale 1 puff into the lungs 2 (two) times daily.     Pulmonology:  Combination Products Passed - 05/15/2024 11:38 AM      Passed - Valid encounter within last 12 months    Recent Outpatient Visits           1 month ago Hypertension associated with diabetes [facility])   [place] [facility], NP   4 months ago Encounter for completion of form with patient   [place] [facility] Pa[facility], NP   5 months ago Hypertension associated with diabetes [facility])   [place] [facility] Pa[facility], NP   8 months ago Hypertension associated with diabetes [facility])   [place] [facility] Melvin Pao, NP

## 2024-05-15 NOTE — Telephone Encounter (Signed)
 Requested medication (s) are due for refill today: yes  Requested medication (s) are on the active medication list: yes  Last refill:  04/16/24  Future visit scheduled: yes  Notes to clinic:  Unable to refill per protocol, cannot delegate.      Requested Prescriptions  Pending Prescriptions Disp Refills   ketoconazole  (NIZORAL ) 2 % cream 60 g 2    Sig: Apply 1 Application topically daily.     Not Delegated - Over the Counter: OTC 2 Failed - 05/15/2024 12:27 PM      Failed - This refill cannot be delegated      Passed - Valid encounter within last 12 months    Recent Outpatient Visits           1 month ago Hypertension associated with diabetes [facility])   [place] [facility] Pa[facility], NP   4 months ago Encounter for completion of form with patient   [place] [facility] Pa[facility], NP   5 months ago Hypertension associated with diabetes [facility])   [place] [facility] Pao, NP   8 months ago Hypertension associated with diabetes [facility])   [place] [facility] Mel[facility] Pao, NP              Signed Prescriptions Disp Refills   allopurinol  (ZYLOPRIM ) 300 MG tablet 90 tablet 0    Sig: Take 1 tablet (300 mg total) by mouth daily.     Endocrinology:  Gout Agents - allopurinol  Failed - 05/15/2024 12:27 PM      Failed - Uric Acid in normal range and within 360 days    Uric Acid  Date Value Ref Range Status  07/28/2020 2.4 (L) 3.8 - 8.4 mg/dL Final    Comment:               Therapeutic target for gout patients: <6.0         Failed - Cr in normal range and within 360 days    Creatinine, Ser  Date Value Ref Range Status  04/16/2024 1.73 (H) 0.76 - 1.27 mg/dL Final   Creatinine, Urine  Date Value Ref Range Status  03/05/2019 82 mg/dL Final    Comment:    Performed at [facility], [address] [place], [place] [address]         Failed - CBC within normal limits and  completed in the last 12 months    WBC  Date Value Ref Range Status  11/28/2022 8.4 3.4 - 10.8 x10E3/uL Final  03/10/2019 6.4 4.0 - 10.5 K/uL Final   RBC  Date Value Ref Range Status  11/28/2022 4.54 4.14 - 5.80 x10E6/uL Final  03/10/2019 4.14 (L) 4.22 - 5.81 MIL/uL Final   Hemoglobin  Date Value Ref Range Status  11/28/2022 13.1 13.0 - 17.7 g/dL Final   Hematocrit  Date Value Ref Range Status  11/28/2022 39.5 37.5 - 51.0 % Final   MCHC  Date Value Ref Range Status  11/28/2022 33.2 31.5 - 35.7 g/dL Final  88/69/7979 65.3 30.0 - 36.0 g/dL Final   [facility]  Date Value Ref Range Status  11/28/2022 28.9 26.6 - 33.0 pg Final  03/10/2019 31.4 26.0 - 34.0 pg Final   MCV  Date Value Ref Range Status  11/28/2022 87 79 - 97 fL Final   No results found for: PLTCOUNTKUC, LABPLAT, POCPLA RDW  Date Value Ref Range Status  11/28/2022 14.5 11.6 - 15.4 % Final  Passed - Valid encounter within last 12 months    Recent Outpatient Visits           1 month ago Hypertension associated with [facility])   [place] [facility], NP   4 months ago Encounter for completion of form with patient   [place] [facility]   5 months ago Hypertension associated with diabetes [facility])   [place] [facility]   8 months ago Hypertension associated with diabetes [facility])   [place] [facility] Melvin Pao, NP              Refused Prescriptions Disp Refills   pantoprazole  (PROTONIX ) 40 MG tablet 90 tablet 1    Sig: Take 1 tablet (40 mg total) by mouth daily.     Gastroenterology: Proton Pump Inhibitors Passed - 05/15/2024 12:27 PM      Passed - Valid encounter within last 12 months    Recent Outpatient Visits           1 month ago Hypertension associated with diabetes [facility])   [place] [facility] Mel[facility] Pao, NP   4 months ago  Encounter for completion of form with patient   [place] [facility], NP   5 months ago Hypertension associated with diabetes [facility])   [place] [facility], NP   8 months ago Hypertension associated with diabetes [facility])   [place] [facility] Melvin Pao, NP               albuterol  (VENTOLIN  HFA) 108 (90 Base) MCG/ACT inhaler 18 g 3    Sig: Inhale 2 puffs into the lungs every 6 (six) hours as needed for wheezing or shortness of breath.     Pulmonology:  Beta Agonists 2 Passed - 05/15/2024 12:27 PM      Passed - Last BP in normal range    BP Readings from Last 1 Encounters:  04/07/24 (!) 107/58         Passed - Last Heart Rate in normal range    Pulse Readings from Last 1 Encounters:  04/07/24 89         Passed - Valid encounter within last 12 months    Recent Outpatient Visits           1 month ago Hypertension associated with diabetes [facility])   [place] [facility] Pa[facility], NP   4 months ago Encounter for completion of form with patient   [place] [facility], NP   5 months ago Hypertension associated with diabetes [facility])   [place] [facility] Pao, NP   8 months ago Hypertension associated with diabetes [facility])   [place] [facility] Melvin Pao, NP               fluticasone -salmeterol (ADVAIR) 100-50 MCG/ACT AEPB 1 each 3    Sig: Inhale 1 puff into the lungs 2 (two) times daily.     Pulmonology:  Combination Products Passed - 05/15/2024 12:27 PM      Passed - Valid encounter within last 12 months    Recent Outpatient Visits           1 month ago Hypertension associated with diabetes [facility])   [place] [facility] Melvin Pao, NP   4 months ago Encounter for completion of form with  patient   [place] [facility] Pao, NP    5 months ago Hypertension associated with diabetes [facility])   [place] [facility] Melvin Pao, NP   8 months ago Hypertension associated with diabetes [facility])   [facility] Melvin Pao, NP

## 2024-05-15 NOTE — Telephone Encounter (Signed)
 Requested by patient. Future visit 07/08/24.  Requested Prescriptions  Pending Prescriptions Disp Refills   ketoconazole  (NIZORAL ) 2 % cream 60 g 2    Sig: Apply 1 Application topically daily.     Not Delegated - Over the Counter: OTC 2 Failed - 05/15/2024 11:38 AM      Failed - This refill cannot be delegated      Passed - Valid encounter within last 12 months    Recent Outpatient Visits           1 month ago Hypertension associated with diabetes [facility])   [place] [facility] Pa[facility], NP   4 months ago Encounter for completion of form with patient   [place] [facility] Pa[facility], NP   5 months ago Hypertension associated with diabetes [facility])   [place] [facility] Pa[facility], NP   8 months ago Hypertension associated with diabetes [facility])   [place] [facility] Pao, NP               allopurinol  (ZYLOPRIM ) 300 MG tablet 90 tablet 1    Sig: Take 1 tablet (300 mg total) by mouth daily.     Endocrinology:  Gout Agents - allopurinol  Failed - 05/15/2024 11:38 AM      Failed - Uric Acid in normal range and within 360 days    Uric Acid  Date Value Ref Range Status  07/28/2020 2.4 (L) 3.8 - 8.4 mg/dL Final    Comment:               Therapeutic target for gout patients: <6.0         Failed - Cr in normal range and within 360 days    Creatinine, Ser  Date Value Ref Range Status  04/16/2024 1.73 (H) 0.76 - 1.27 mg/dL Final   Creatinine, Urine  Date Value Ref Range Status  03/05/2019 82 mg/dL Final    Comment:    Performed at [facility], [address] [place], [place] [address]         Failed - CBC within normal limits and completed in the last 12 months    WBC  Date Value Ref Range Status  11/28/2022 8.4 3.4 - 10.8 x10E3/uL Final  03/10/2019 6.4 4.0 - 10.5 K/uL Final   RBC  Date Value Ref Range Status  11/28/2022 4.54 4.14 - 5.80 x10E6/uL Final  03/10/2019  4.14 (L) 4.22 - 5.81 MIL/uL Final   Hemoglobin  Date Value Ref Range Status  11/28/2022 13.1 13.0 - 17.7 g/dL Final   Hematocrit  Date Value Ref Range Status  11/28/2022 39.5 37.5 - 51.0 % Final   MCHC  Date Value Ref Range Status  11/28/2022 33.2 31.5 - 35.7 g/dL Final  88/69/7979 65.3 30.0 - 36.0 g/dL Final   [facility]  Date Value Ref Range Status  11/28/2022 28.9 26.6 - 33.0 pg Final  03/10/2019 31.4 26.0 - 34.0 pg Final   MCV  Date Value Ref Range Status  11/28/2022 87 79 - 97 fL Final   No results found for: PLTCOUNTKUC, LABPLAT, POCPLA RDW  Date Value Ref Range Status  11/28/2022 14.5 11.6 - 15.4 % Final         Passed - Valid encounter within last 12 months    Recent Outpatient Visits           1 month ago Hypertension associated with [facility])   [place] [facility],  Darice, NP   4 months ago Encounter for completion of form with patient   [place] [facility] Melvin Darice, NP   5 months ago Hypertension associated with diabetes [facility])   [place] [facility] Melvin Darice, NP   8 months ago Hypertension associated with [facility])   [place] [facility] Melvin Darice, NP               albuterol  (VENTOLIN  HFA) 108 (90 Base) MCG/ACT inhaler 18 g 2    Sig: Inhale 2 puffs into the lungs every 6 (six) hours as needed for wheezing or shortness of breath.     Pulmonology:  Beta Agonists 2 Passed - 05/15/2024 11:38 AM      Passed - Last BP in normal range    BP Readings from Last 1 Encounters:  04/07/24 (!) 107/58         Passed - Last Heart Rate in normal range    Pulse Readings from Last 1 Encounters:  04/07/24 89         Passed - Valid encounter within last 12 months    Recent Outpatient Visits           1 month ago Hypertension associated with [facility])   [place] [facility] Melvin Darice, NP   4 months ago Encounter for  completion of form with patient   [place] [facility] Melvin Darice, NP   5 months ago Hypertension associated with diabetes [facility])   [place] [facility] Melvin Darice, NP   8 months ago Hypertension associated with diabetes [facility])   [place] [facility] Darice, NP               fluticasone -salmeterol (ADVAIR) 100-50 MCG/ACT AEPB 1 each 2    Sig: Inhale 1 puff into the lungs 2 (two) times daily.     Pulmonology:  Combination Products Passed - 05/15/2024 11:38 AM      Passed - Valid encounter within last 12 months    Recent Outpatient Visits           1 month ago Hypertension associated with [facility])   [place] [facility] Melvin Darice, NP   4 months ago Encounter for completion of form with patient   [place] [facility] Dar[facility], NP   5 months ago Hypertension associated with diabetes [facility])   [place] [facility] Melvin Darice, NP   8 months ago Hypertension associated with diabetes [facility])    [facility] Melvin Darice, NP

## 2024-07-08 ENCOUNTER — Ambulatory Visit: Admitting: Nurse Practitioner

## 2025-04-09 ENCOUNTER — Ambulatory Visit
